# Patient Record
Sex: Female | Born: 1965
Health system: Southern US, Community
[De-identification: ages and names within clinical notes are randomized; demographics above are authoritative.]

## PROBLEM LIST (undated history)

## (undated) DIAGNOSIS — E279 Disorder of adrenal gland, unspecified: Secondary | ICD-10-CM

## (undated) DIAGNOSIS — Z9289 Personal history of other medical treatment: Secondary | ICD-10-CM

## (undated) DIAGNOSIS — G43909 Migraine, unspecified, not intractable, without status migrainosus: Secondary | ICD-10-CM

## (undated) DIAGNOSIS — I890 Lymphedema, not elsewhere classified: Secondary | ICD-10-CM

## (undated) DIAGNOSIS — C50919 Malignant neoplasm of unspecified site of unspecified female breast: Secondary | ICD-10-CM

## (undated) DIAGNOSIS — Z9889 Other specified postprocedural states: Secondary | ICD-10-CM

## (undated) DIAGNOSIS — Z124 Encounter for screening for malignant neoplasm of cervix: Secondary | ICD-10-CM

## (undated) HISTORY — DX: Other specified postprocedural states: Z98.890

## (undated) HISTORY — PX: BREAST SURGERY: SHX581

## (undated) HISTORY — DX: Personal history of other medical treatment: Z92.89

## (undated) HISTORY — DX: Lymphedema, not elsewhere classified: I89.0

## (undated) HISTORY — DX: Encounter for screening for malignant neoplasm of cervix: Z12.4

## (undated) HISTORY — DX: Migraine, unspecified, not intractable, without status migrainosus: G43.909

## (undated) HISTORY — PX: HERNIA REPAIR: SHX51

## (undated) HISTORY — DX: Disorder of adrenal gland, unspecified: E27.9

## (undated) HISTORY — DX: Malignant neoplasm of unspecified site of unspecified female breast: C50.919

---

## 1992-03-17 HISTORY — PX: CARDIAC CATHETERIZATION: SHX172

## 2003-03-18 HISTORY — PX: BREAST ENHANCEMENT SURGERY: SHX7

## 2006-06-18 DIAGNOSIS — G43909 Migraine, unspecified, not intractable, without status migrainosus: Secondary | ICD-10-CM | POA: Insufficient documentation

## 2007-02-03 ENCOUNTER — Emergency Department: Payer: Self-pay | Admitting: Emergency Medicine

## 2007-03-04 ENCOUNTER — Ambulatory Visit: Payer: Self-pay

## 2008-05-11 ENCOUNTER — Ambulatory Visit: Payer: Self-pay | Admitting: Family Medicine

## 2009-04-12 ENCOUNTER — Ambulatory Visit: Payer: Self-pay | Admitting: Family Medicine

## 2009-11-30 ENCOUNTER — Other Ambulatory Visit: Payer: Self-pay | Admitting: Family Medicine

## 2010-12-24 ENCOUNTER — Ambulatory Visit: Payer: Self-pay | Admitting: Obstetrics and Gynecology

## 2011-01-01 DIAGNOSIS — C50919 Malignant neoplasm of unspecified site of unspecified female breast: Secondary | ICD-10-CM | POA: Insufficient documentation

## 2011-01-01 DIAGNOSIS — Z853 Personal history of malignant neoplasm of breast: Secondary | ICD-10-CM | POA: Insufficient documentation

## 2011-02-15 HISTORY — PX: MASTECTOMY: SHX3

## 2011-03-18 HISTORY — PX: PORT A CATH INJECTION (ARMC HX): HXRAD1731

## 2011-04-03 ENCOUNTER — Ambulatory Visit: Payer: Self-pay | Admitting: Internal Medicine

## 2011-04-18 ENCOUNTER — Ambulatory Visit: Payer: Self-pay | Admitting: Internal Medicine

## 2011-05-16 ENCOUNTER — Ambulatory Visit: Payer: Self-pay | Admitting: Internal Medicine

## 2011-06-16 ENCOUNTER — Ambulatory Visit: Payer: Self-pay | Admitting: Internal Medicine

## 2011-07-16 ENCOUNTER — Ambulatory Visit: Payer: Self-pay | Admitting: Internal Medicine

## 2011-07-16 ENCOUNTER — Encounter: Payer: Self-pay | Admitting: Hematology & Oncology

## 2011-07-29 LAB — CBC CANCER CENTER
Basophil #: 0.1 x10 3/mm (ref 0.0–0.1)
Eosinophil #: 0.1 x10 3/mm (ref 0.0–0.7)
Eosinophil %: 1.3 %
HCT: 32.1 % — ABNORMAL LOW (ref 35.0–47.0)
HGB: 10.7 g/dL — ABNORMAL LOW (ref 12.0–16.0)
Lymphocyte #: 1 x10 3/mm (ref 1.0–3.6)
Lymphocyte %: 16.1 %
MCHC: 33.5 g/dL (ref 32.0–36.0)
Monocyte #: 0.8 x10 3/mm (ref 0.2–0.9)
Neutrophil #: 4.1 x10 3/mm (ref 1.4–6.5)
Neutrophil %: 68.6 %
Platelet: 430 x10 3/mm (ref 150–440)
WBC: 6 x10 3/mm (ref 3.6–11.0)

## 2011-07-29 LAB — COMPREHENSIVE METABOLIC PANEL
Albumin: 2.9 g/dL — ABNORMAL LOW (ref 3.4–5.0)
Alkaline Phosphatase: 83 U/L (ref 50–136)
Anion Gap: 7 (ref 7–16)
Calcium, Total: 8.3 mg/dL — ABNORMAL LOW (ref 8.5–10.1)
Chloride: 106 mmol/L (ref 98–107)
Co2: 28 mmol/L (ref 21–32)
EGFR (African American): >60
EGFR (Non-African Amer.): >60
Glucose: 118 mg/dL — ABNORMAL HIGH (ref 65–99)
SGOT(AST): 64 U/L — ABNORMAL HIGH (ref 15–37)
Sodium: 141 mmol/L (ref 136–145)
Total Protein: 6.1 g/dL — ABNORMAL LOW (ref 6.4–8.2)

## 2011-07-29 LAB — PROTIME-INR: INR: 1

## 2011-07-29 LAB — APTT: Activated PTT: 39.1 secs — ABNORMAL HIGH (ref 23.6–35.9)

## 2011-08-16 ENCOUNTER — Ambulatory Visit: Payer: Self-pay | Admitting: Internal Medicine

## 2011-08-16 ENCOUNTER — Encounter: Payer: Self-pay | Admitting: Hematology & Oncology

## 2011-09-10 DIAGNOSIS — E28319 Asymptomatic premature menopause: Secondary | ICD-10-CM | POA: Insufficient documentation

## 2011-09-10 DIAGNOSIS — E8989 Other postprocedural endocrine and metabolic complications and disorders: Secondary | ICD-10-CM | POA: Insufficient documentation

## 2011-09-10 DIAGNOSIS — R0989 Other specified symptoms and signs involving the circulatory and respiratory systems: Secondary | ICD-10-CM | POA: Insufficient documentation

## 2011-09-10 DIAGNOSIS — I890 Lymphedema, not elsewhere classified: Secondary | ICD-10-CM | POA: Insufficient documentation

## 2011-09-15 ENCOUNTER — Ambulatory Visit: Payer: Self-pay | Admitting: Internal Medicine

## 2011-09-15 ENCOUNTER — Encounter: Payer: Self-pay | Admitting: Hematology & Oncology

## 2011-10-16 ENCOUNTER — Encounter: Payer: Self-pay | Admitting: Hematology & Oncology

## 2011-10-16 ENCOUNTER — Ambulatory Visit: Payer: Self-pay | Admitting: Internal Medicine

## 2011-10-29 DIAGNOSIS — Z8709 Personal history of other diseases of the respiratory system: Secondary | ICD-10-CM | POA: Insufficient documentation

## 2011-11-16 ENCOUNTER — Encounter: Payer: Self-pay | Admitting: Hematology & Oncology

## 2011-12-08 ENCOUNTER — Ambulatory Visit: Payer: Self-pay | Admitting: Internal Medicine

## 2011-12-16 ENCOUNTER — Ambulatory Visit: Payer: Self-pay | Admitting: Internal Medicine

## 2011-12-16 ENCOUNTER — Encounter: Payer: Self-pay | Admitting: Hematology & Oncology

## 2012-01-16 ENCOUNTER — Encounter: Payer: Self-pay | Admitting: Hematology & Oncology

## 2012-02-05 ENCOUNTER — Ambulatory Visit: Payer: Self-pay | Admitting: Internal Medicine

## 2012-02-05 LAB — CBC CANCER CENTER
Basophil %: 0.8 %
Eosinophil #: 0.1 x10 3/mm (ref 0.0–0.7)
HGB: 13.5 g/dL (ref 12.0–16.0)
Lymphocyte #: 1 x10 3/mm (ref 1.0–3.6)
Lymphocyte %: 16.3 %
MCH: 31 pg (ref 26.0–34.0)
MCHC: 32.8 g/dL (ref 32.0–36.0)
Monocyte #: 0.4 x10 3/mm (ref 0.2–0.9)
Neutrophil %: 73.9 %
Platelet: 211 x10 3/mm (ref 150–440)
RBC: 4.37 10*6/uL (ref 3.80–5.20)
RDW: 13.8 % (ref 11.5–14.5)

## 2012-02-15 ENCOUNTER — Encounter: Payer: Self-pay | Admitting: Hematology & Oncology

## 2012-02-15 ENCOUNTER — Ambulatory Visit: Payer: Self-pay | Admitting: Internal Medicine

## 2012-03-17 ENCOUNTER — Ambulatory Visit: Payer: Self-pay | Admitting: Internal Medicine

## 2012-03-17 ENCOUNTER — Encounter: Payer: Self-pay | Admitting: Hematology & Oncology

## 2012-04-17 ENCOUNTER — Encounter: Payer: Self-pay | Admitting: Hematology & Oncology

## 2012-04-21 ENCOUNTER — Ambulatory Visit: Payer: Self-pay | Admitting: Internal Medicine

## 2012-05-15 ENCOUNTER — Ambulatory Visit: Payer: Self-pay | Admitting: Internal Medicine

## 2012-05-15 ENCOUNTER — Encounter: Payer: Self-pay | Admitting: Hematology & Oncology

## 2012-06-04 LAB — COMPREHENSIVE METABOLIC PANEL
Albumin: 3.6 g/dL (ref 3.4–5.0)
Alkaline Phosphatase: 87 U/L (ref 50–136)
BUN: 24 mg/dL — ABNORMAL HIGH (ref 7–18)
Bilirubin,Total: 0.4 mg/dL (ref 0.2–1.0)
Calcium, Total: 8.2 mg/dL — ABNORMAL LOW (ref 8.5–10.1)
Chloride: 101 mmol/L (ref 98–107)
Creatinine: 0.98 mg/dL (ref 0.60–1.30)
EGFR (African American): >60
EGFR (Non-African Amer.): >60
Glucose: 89 mg/dL (ref 65–99)
Osmolality: 281 (ref 275–301)
Potassium: 3.8 mmol/L (ref 3.5–5.1)
SGOT(AST): 28 U/L (ref 15–37)
SGPT (ALT): 57 U/L (ref 12–78)
Sodium: 139 mmol/L (ref 136–145)

## 2012-06-04 LAB — CBC CANCER CENTER
Basophil %: 0.7 %
MCV: 94 fL (ref 80–100)
Monocyte #: 0.7 x10 3/mm (ref 0.2–0.9)
RBC: 4.18 10*6/uL (ref 3.80–5.20)
WBC: 9.5 x10 3/mm (ref 3.6–11.0)

## 2012-06-04 LAB — LIPID PANEL
Cholesterol: 168 mg/dL (ref 0–200)
HDL Cholesterol: 51 mg/dL (ref 40–60)
Triglycerides: 71 mg/dL (ref 0–200)
VLDL Cholesterol, Calc: 14 mg/dL (ref 5–40)

## 2012-06-15 ENCOUNTER — Ambulatory Visit: Payer: Self-pay | Admitting: Internal Medicine

## 2012-07-27 ENCOUNTER — Ambulatory Visit: Payer: Self-pay | Admitting: Internal Medicine

## 2012-07-29 LAB — CBC CANCER CENTER
Basophil #: 0 x10 3/mm (ref 0.0–0.1)
Basophil %: 0.8 %
Eosinophil #: 0.2 x10 3/mm (ref 0.0–0.7)
Eosinophil %: 3.9 %
HCT: 39.2 % (ref 35.0–47.0)
HGB: 13.5 g/dL (ref 12.0–16.0)
Lymphocyte #: 1.1 x10 3/mm (ref 1.0–3.6)
Lymphocyte %: 26.1 %
MCHC: 34.3 g/dL (ref 32.0–36.0)
MCV: 94 fL (ref 80–100)
Monocyte #: 0.3 x10 3/mm (ref 0.2–0.9)
Neutrophil %: 61.5 %
Platelet: 186 x10 3/mm (ref 150–440)
RBC: 4.18 10*6/uL (ref 3.80–5.20)
RDW: 13 % (ref 11.5–14.5)

## 2012-08-15 ENCOUNTER — Ambulatory Visit: Payer: Self-pay | Admitting: Internal Medicine

## 2012-09-14 ENCOUNTER — Ambulatory Visit: Payer: Self-pay | Admitting: Internal Medicine

## 2012-10-15 ENCOUNTER — Ambulatory Visit: Payer: Self-pay | Admitting: Internal Medicine

## 2012-11-15 ENCOUNTER — Ambulatory Visit: Payer: Self-pay | Admitting: Internal Medicine

## 2012-11-23 LAB — COMPREHENSIVE METABOLIC PANEL
Albumin: 4 g/dL (ref 3.4–5.0)
Anion Gap: 10 (ref 7–16)
BUN: 21 mg/dL — ABNORMAL HIGH (ref 7–18)
Bilirubin,Total: 0.3 mg/dL (ref 0.2–1.0)
Calcium, Total: 9.1 mg/dL (ref 8.5–10.1)
Chloride: 106 mmol/L (ref 98–107)
Glucose: 77 mg/dL (ref 65–99)
Potassium: 4 mmol/L (ref 3.5–5.1)
SGPT (ALT): 36 U/L (ref 12–78)
Sodium: 143 mmol/L (ref 136–145)

## 2012-11-23 LAB — CBC CANCER CENTER
Basophil #: 0 x10 3/mm (ref 0.0–0.1)
Basophil %: 0.5 %
Eosinophil #: 0.2 x10 3/mm (ref 0.0–0.7)
Eosinophil %: 3.1 %
MCH: 31.1 pg (ref 26.0–34.0)
MCV: 94 fL (ref 80–100)
Monocyte #: 0.5 x10 3/mm (ref 0.2–0.9)
Monocyte %: 9.2 %
Neutrophil #: 2.8 x10 3/mm (ref 1.4–6.5)
Platelet: 178 x10 3/mm (ref 150–440)
RBC: 4.54 10*6/uL (ref 3.80–5.20)
RDW: 13.1 % (ref 11.5–14.5)
WBC: 5 x10 3/mm (ref 3.6–11.0)

## 2012-12-15 ENCOUNTER — Ambulatory Visit: Payer: Self-pay | Admitting: Internal Medicine

## 2013-01-19 ENCOUNTER — Ambulatory Visit: Payer: Self-pay | Admitting: Internal Medicine

## 2013-02-14 ENCOUNTER — Ambulatory Visit: Payer: Self-pay | Admitting: Internal Medicine

## 2013-03-14 LAB — CBC CANCER CENTER
HCT: 42.2 % (ref 35.0–47.0)
Lymphocyte %: 28.8 %
MCV: 93 fL (ref 80–100)
Monocyte #: 0.5 x10 3/mm (ref 0.2–0.9)
Neutrophil #: 2.8 x10 3/mm (ref 1.4–6.5)
Platelet: 199 x10 3/mm (ref 150–440)
RBC: 4.54 10*6/uL (ref 3.80–5.20)
RDW: 12.9 % (ref 11.5–14.5)

## 2013-03-14 LAB — COMPREHENSIVE METABOLIC PANEL
Albumin: 3.5 g/dL (ref 3.4–5.0)
Anion Gap: 8 (ref 7–16)
BUN: 23 mg/dL — ABNORMAL HIGH (ref 7–18)
Bilirubin,Total: 0.2 mg/dL (ref 0.2–1.0)
Calcium, Total: 8.7 mg/dL (ref 8.5–10.1)
Chloride: 103 mmol/L (ref 98–107)
Co2: 28 mmol/L (ref 21–32)
Creatinine: 0.84 mg/dL (ref 0.60–1.30)
EGFR (African American): >60
EGFR (Non-African Amer.): >60
Glucose: 110 mg/dL — ABNORMAL HIGH (ref 65–99)
Osmolality: 282 (ref 275–301)
Potassium: 4 mmol/L (ref 3.5–5.1)
Sodium: 139 mmol/L (ref 136–145)
Total Protein: 6.7 g/dL (ref 6.4–8.2)

## 2013-03-17 ENCOUNTER — Ambulatory Visit: Payer: Self-pay | Admitting: Internal Medicine

## 2013-03-17 DIAGNOSIS — Z9289 Personal history of other medical treatment: Secondary | ICD-10-CM

## 2013-03-17 HISTORY — DX: Personal history of other medical treatment: Z92.89

## 2013-04-12 ENCOUNTER — Ambulatory Visit: Payer: Self-pay | Admitting: Family Medicine

## 2013-04-13 ENCOUNTER — Encounter: Payer: Self-pay | Admitting: Family Medicine

## 2013-04-17 ENCOUNTER — Ambulatory Visit: Payer: Self-pay | Admitting: Internal Medicine

## 2013-04-17 ENCOUNTER — Encounter: Payer: Self-pay | Admitting: Family Medicine

## 2013-05-15 ENCOUNTER — Encounter: Payer: Self-pay | Admitting: Family Medicine

## 2013-05-16 ENCOUNTER — Ambulatory Visit: Payer: Self-pay | Admitting: Internal Medicine

## 2013-05-16 LAB — BUN: BUN: 18 mg/dL (ref 7–18)

## 2013-05-16 LAB — CREATININE, SERUM
Creatinine: 0.94 mg/dL (ref 0.60–1.30)
EGFR (African American): >60
EGFR (Non-African Amer.): >60

## 2013-05-16 LAB — POTASSIUM: POTASSIUM: 4.4 mmol/L (ref 3.5–5.1)

## 2013-06-15 ENCOUNTER — Ambulatory Visit: Payer: Self-pay | Admitting: Internal Medicine

## 2013-06-15 ENCOUNTER — Encounter: Payer: Self-pay | Admitting: Family Medicine

## 2013-06-22 LAB — BASIC METABOLIC PANEL
ANION GAP: 7 (ref 7–16)
BUN: 19 mg/dL — ABNORMAL HIGH (ref 7–18)
CHLORIDE: 102 mmol/L (ref 98–107)
CREATININE: 0.86 mg/dL (ref 0.60–1.30)
Calcium, Total: 9.4 mg/dL (ref 8.5–10.1)
Co2: 31 mmol/L (ref 21–32)
EGFR (African American): >60
EGFR (Non-African Amer.): >60
GLUCOSE: 106 mg/dL — AB (ref 65–99)
Osmolality: 282 (ref 275–301)
Potassium: 4 mmol/L (ref 3.5–5.1)
Sodium: 140 mmol/L (ref 136–145)

## 2013-07-15 ENCOUNTER — Ambulatory Visit: Payer: Self-pay | Admitting: Internal Medicine

## 2013-11-30 LAB — HEMOGLOBIN A1C: HEMOGLOBIN A1C: 5.5 % (ref 4.0–6.0)

## 2014-01-04 ENCOUNTER — Encounter: Payer: Self-pay | Admitting: Hematology & Oncology

## 2014-01-04 DIAGNOSIS — I890 Lymphedema, not elsewhere classified: Secondary | ICD-10-CM | POA: Insufficient documentation

## 2014-01-04 DIAGNOSIS — I972 Postmastectomy lymphedema syndrome: Secondary | ICD-10-CM | POA: Insufficient documentation

## 2014-01-15 ENCOUNTER — Encounter: Payer: Self-pay | Admitting: Hematology & Oncology

## 2014-02-22 ENCOUNTER — Ambulatory Visit: Payer: Self-pay | Admitting: General Practice

## 2014-06-16 LAB — BASIC METABOLIC PANEL
BUN: 16 mg/dL (ref 4–21)
Creatinine: 0.9 mg/dL (ref 0.5–1.1)
Glucose: 92 mg/dL
Potassium: 4.3 mmol/L (ref 3.4–5.3)
Sodium: 143 mmol/L (ref 137–147)

## 2014-06-16 LAB — HEPATIC FUNCTION PANEL
ALT: 25 U/L (ref 7–35)
AST: 22 U/L (ref 13–35)

## 2014-06-26 ENCOUNTER — Encounter: Payer: Self-pay | Admitting: *Deleted

## 2014-09-11 ENCOUNTER — Other Ambulatory Visit: Payer: Self-pay | Admitting: Family Medicine

## 2014-09-11 DIAGNOSIS — F419 Anxiety disorder, unspecified: Secondary | ICD-10-CM

## 2014-09-11 DIAGNOSIS — G4700 Insomnia, unspecified: Secondary | ICD-10-CM

## 2014-09-11 MED ORDER — TEMAZEPAM 15 MG PO CAPS: 15.0000 mg | ORAL_CAPSULE | Freq: Every evening | ORAL | Status: DC | PRN

## 2014-09-11 NOTE — Telephone Encounter (Signed)
Rx called into [place].    Thanks,   -Mickel Baas

## 2014-09-11 NOTE — Telephone Encounter (Signed)
Pt called requesting an RX for Restoril 15 mg sent to [facility]. Pt stated her doctor at [facility] used to wrote this for pt and she is having trouble sleeping now that she is on first shift. I advised that we can not do that without seeing her for OV because we didn't write that for her. Pt stated to please ask Dr. Venia Minks anyway because she knows pt history. Thanks TNP

## 2014-09-22 ENCOUNTER — Other Ambulatory Visit: Payer: Self-pay | Admitting: Family Medicine

## 2014-09-22 DIAGNOSIS — K219 Gastro-esophageal reflux disease without esophagitis: Secondary | ICD-10-CM

## 2014-10-03 DIAGNOSIS — E7800 Pure hypercholesterolemia, unspecified: Secondary | ICD-10-CM | POA: Insufficient documentation

## 2014-10-03 DIAGNOSIS — N959 Unspecified menopausal and perimenopausal disorder: Secondary | ICD-10-CM | POA: Insufficient documentation

## 2014-10-03 DIAGNOSIS — I890 Lymphedema, not elsewhere classified: Secondary | ICD-10-CM | POA: Insufficient documentation

## 2014-10-03 DIAGNOSIS — C50919 Malignant neoplasm of unspecified site of unspecified female breast: Secondary | ICD-10-CM | POA: Insufficient documentation

## 2014-10-03 DIAGNOSIS — I972 Postmastectomy lymphedema syndrome: Secondary | ICD-10-CM | POA: Insufficient documentation

## 2014-10-03 DIAGNOSIS — R7303 Prediabetes: Secondary | ICD-10-CM | POA: Insufficient documentation

## 2014-10-03 DIAGNOSIS — I456 Pre-excitation syndrome: Secondary | ICD-10-CM | POA: Insufficient documentation

## 2014-10-03 DIAGNOSIS — N644 Mastodynia: Secondary | ICD-10-CM | POA: Insufficient documentation

## 2014-10-03 DIAGNOSIS — R609 Edema, unspecified: Secondary | ICD-10-CM | POA: Insufficient documentation

## 2014-10-03 DIAGNOSIS — R635 Abnormal weight gain: Secondary | ICD-10-CM | POA: Insufficient documentation

## 2014-10-04 ENCOUNTER — Ambulatory Visit (INDEPENDENT_AMBULATORY_CARE_PROVIDER_SITE_OTHER): Payer: BLUE CROSS/BLUE SHIELD | Admitting: Family Medicine

## 2014-10-04 ENCOUNTER — Encounter: Payer: Self-pay | Admitting: Family Medicine

## 2014-10-04 VITALS — BP 122/78 | HR 88 | Temp 97.8°F | Resp 16 | Ht 65.0 in | Wt 208.0 lb

## 2014-10-04 DIAGNOSIS — G4700 Insomnia, unspecified: Secondary | ICD-10-CM

## 2014-10-04 DIAGNOSIS — R21 Rash and other nonspecific skin eruption: Secondary | ICD-10-CM | POA: Diagnosis not present

## 2014-10-04 DIAGNOSIS — B372 Candidiasis of skin and nail: Secondary | ICD-10-CM

## 2014-10-04 MED ORDER — TRAZODONE HCL 50 MG PO TABS: 50.0000 mg | ORAL_TABLET | Freq: Every day | ORAL | Status: DC

## 2014-10-04 MED ORDER — NYSTATIN 100000 UNIT/GM EX OINT: 1.0000 "application " | TOPICAL_OINTMENT | Freq: Two times a day (BID) | CUTANEOUS | Status: DC

## 2014-10-04 NOTE — Progress Notes (Signed)
Subjective:    Patient ID: Sabrina Morris, female    DOB: 06-29-1965, 49 y.o.   MRN: [id number]  Rash This is a new problem. The current episode started 1 to 4 weeks ago. The problem is unchanged. The affected locations include the chest. The rash is characterized by itchiness and redness. She was exposed to nothing. Pertinent negatives include no congestion, cough, eye pain, fatigue, fever, joint pain, rhinorrhea, shortness of breath or sore throat. Past treatments include anti-itch cream. The treatment provided no relief.   Patient Active Problem List   Diagnosis Date Noted  . Breast CA 10/03/2014  . Breast pain 10/03/2014  . Accumulation of fluid in tissues 10/03/2014  . Hypercholesteremia 10/03/2014  . Acquired lymphedema 10/03/2014  . Menopausal and perimenopausal disorder 10/03/2014  . Borderline diabetes 10/03/2014  . Abnormal weight gain 10/03/2014  . Ventricular pre-excitation with arrhythmia 10/03/2014  . GERD (gastroesophageal reflux disease) 09/22/2014  . Anxiety 09/11/2014  . 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 Lymphedema of leg 01/04/2014  . History of asthma 10/29/2011  . Menopause praecox 09/10/2011  . Lymphedema of arm 09/10/2011  . Abnormal chest sounds 09/10/2011  . Infiltrating ductal carcinoma of breast, stage 3 01/01/2011  . Headache, migraine 06/18/2006   Family History  Problem Relation Age of Onset  . Adopted: Yes  . Family history unknown: Yes   History   Social History  . Marital Status: Single    Spouse Name: N/A  . Number of Children: N/A  . Years of Education: N/A   Occupational History  . 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 Smoking status: Never Smoker   . Smokeless tobacco: Never Used  . Alcohol Use: Yes     Comment: Very rarely  . Drug Use: No  . Sexual Activity: Not on file   Other Topics Concern  . Not on file   Social History Narrative  . No narrative on file   Past Surgical  History  Procedure Laterality Date  . Mastectomy Right 02/2011  . Cardiac catheterization  1994  . Breast enhancement surgery Bilateral 2005   Allergies  Allergen Reactions  . Sumatriptan Other (See Comments) and Shortness Of Breath  . Sulfa Antibiotics   . Zoladex  [Goserelin]   . Hydrocodone-Acetaminophen Rash    Hyperactivity   Previous Medications   BUPROPION (WELLBUTRIN SR) 150 MG 12 HR TABLET    Take 1 tablet by mouth daily.   CYCLOBENZAPRINE (FLEXERIL) 5 MG TABLET    Take 1 tablet by mouth as needed.   ESCITALOPRAM (LEXAPRO) 20 MG TABLET    TAKE 1 TABLET EVERY DAY   FUROSEMIDE (LASIX) 20 MG TABLET    Take 1 tablet by mouth daily.   LORAZEPAM (ATIVAN) 1 MG TABLET    Take 1 tablet by mouth 2 (two) times daily. As needed   MELOXICAM (MOBIC) 7.5 MG TABLET    Take 1 tablet by mouth 2 (two) times daily. As needed   OMEPRAZOLE-SODIUM BICARBONATE (ZEGERID) 20-1100 MG CAPS CAPSULE    Take 1 capsule by mouth daily.   POTASSIUM CHLORIDE (K-DUR,KLOR-CON) 10 MEQ TABLET    Take 1 tablet by mouth daily.   RIZATRIPTAN (MAXALT) 10 MG TABLET    Take 1 tablet by mouth as needed.   TEMAZEPAM (RESTORIL) 15 MG CAPSULE    Take 1 capsule (15 mg total) by mouth at bedtime as needed for sleep.   BP 122/78  mmHg  Pulse 88  Temp(Src) 97.8 F (36.6 C) (Oral)  Resp 16  Ht 5\' 5"  (1.651 m)  Wt 208 lb (94.348 kg)  BMI 34.61 kg/m2    Review of Systems  Constitutional: Negative for fever, chills, diaphoresis, activity change, appetite change, fatigue and unexpected weight change.  HENT: Negative for congestion, rhinorrhea and sore throat.   Eyes: Negative for pain.  Respiratory: Negative for cough and shortness of breath.   Musculoskeletal: Negative for joint pain.  Skin: Positive for rash. Negative for color change, pallor and wound.       Objective:   Physical Exam  Constitutional: She appears well-developed and well-nourished.  Cardiovascular: Normal rate and regular rhythm.   Skin: Skin is  warm. Rash (muliple circular scaly patches at border of skin where prosthesis touches skin. Some satellite lesions around left  breast. ) noted. There is erythema.   BP 122/78 mmHg  Pulse 88  Temp(Src) 97.8 F (36.6 C) (Oral)  Resp 16  Ht 5\' 5"  (1.651 m)  Wt 208 lb (94.348 kg)  BMI 34.61 kg/m2      Assessment & Plan:  1. Rash Suspect Tinea.   2. Skin yeast infection For  Tinea corporis, will treat. Please call back if condition worsens or does not continue to improve.    - nystatin ointment (MYCOSTATIN); Apply 1 application topically 2 (two) times daily.  Dispense: 30 g; Refill: 1  3. Insomnia Did not improve with other medication. Will try Trazodone. Call back if condition worsens or does not continue to improve.    - traZODone (DESYREL) 50 MG tablet; Take 1 tablet (50 mg total) by mouth at bedtime.  Dispense: 30 tablet; Refill: 5   Margarita Rana, MD

## 2014-11-10 ENCOUNTER — Other Ambulatory Visit: Payer: Self-pay | Admitting: *Deleted

## 2014-11-10 ENCOUNTER — Inpatient Hospital Stay: Payer: Self-pay | Admitting: Family Medicine

## 2014-11-10 ENCOUNTER — Inpatient Hospital Stay: Payer: Self-pay | Attending: Family Medicine

## 2014-11-10 DIAGNOSIS — C50919 Malignant neoplasm of unspecified site of unspecified female breast: Secondary | ICD-10-CM

## 2014-11-30 ENCOUNTER — Other Ambulatory Visit: Payer: Self-pay

## 2014-11-30 DIAGNOSIS — E876 Hypokalemia: Secondary | ICD-10-CM | POA: Insufficient documentation

## 2014-11-30 MED ORDER — POTASSIUM CHLORIDE CRYS ER 10 MEQ PO TBCR: 10.0000 meq | EXTENDED_RELEASE_TABLET | Freq: Every day | ORAL | Status: DC

## 2014-12-13 ENCOUNTER — Other Ambulatory Visit: Payer: Self-pay

## 2014-12-13 ENCOUNTER — Ambulatory Visit: Payer: Self-pay | Admitting: Family Medicine

## 2014-12-14 ENCOUNTER — Other Ambulatory Visit: Payer: Self-pay

## 2014-12-14 DIAGNOSIS — F419 Anxiety disorder, unspecified: Secondary | ICD-10-CM

## 2014-12-14 MED ORDER — LORAZEPAM 1 MG PO TABS: 1.0000 mg | ORAL_TABLET | Freq: Two times a day (BID) | ORAL | Status: DC

## 2014-12-14 NOTE — Telephone Encounter (Signed)
Ok to call in rx.  Thanks.  

## 2015-02-16 ENCOUNTER — Other Ambulatory Visit: Payer: Self-pay

## 2015-02-16 MED ORDER — MELOXICAM 7.5 MG PO TABS: 7.5000 mg | ORAL_TABLET | Freq: Two times a day (BID) | ORAL | Status: DC

## 2015-02-19 ENCOUNTER — Other Ambulatory Visit: Payer: Self-pay | Admitting: Family Medicine

## 2015-02-19 DIAGNOSIS — G43809 Other migraine, not intractable, without status migrainosus: Secondary | ICD-10-CM

## 2015-02-19 MED ORDER — RIZATRIPTAN BENZOATE 10 MG PO TABS: 10.0000 mg | ORAL_TABLET | ORAL | Status: DC | PRN

## 2015-02-19 NOTE — Telephone Encounter (Signed)
Pt contacted office for refill request on the following medications: rizatriptan (MAXALT) 10 MG tablet to [facility]. Thanks TNP

## 2015-02-23 ENCOUNTER — Telehealth: Payer: Self-pay | Admitting: Family Medicine

## 2015-02-23 NOTE — Telephone Encounter (Signed)
Pt stated that she has noticed herself gaining weight and she read about different effects that escitalopram (LEXAPRO) 20 MG tablet could cause. Pt would like to know what she should do to start coming off of the medication. Pt was advised that Dr. Venia Minks is out of the office this afternoon. Thanks TNP

## 2015-02-23 NOTE — Telephone Encounter (Signed)
Would recommend a taper.  Can send in 2 10 mg and decrease to 15 for 2 weeks and then 10 for 2 weeks, etc. Or she can try to cut 20 in half and call me in 2 weeks to continue taper if feels ok. Thanks.

## 2015-02-27 NOTE — Telephone Encounter (Signed)
Left message to call back  

## 2015-02-28 NOTE — Telephone Encounter (Signed)
Pt agrees to take 10 mg po qd x 2 weeks, and then call Dr. Venia Minks. [name], CMA

## 2015-03-14 ENCOUNTER — Telehealth: Payer: Self-pay | Admitting: Family Medicine

## 2015-03-14 NOTE — Telephone Encounter (Signed)
LMTCB. sd  

## 2015-03-14 NOTE — Telephone Encounter (Signed)
Patient advised to take 5 mg daily for 2-3 weeks then she can discontinue Lexapro completely. Patient verbalizes understanding and is in agreement with treatment plan.

## 2015-03-14 NOTE — Telephone Encounter (Signed)
Pt is returning call/MW °

## 2015-03-14 NOTE — Telephone Encounter (Signed)
Pt has questions about the Rx escitalopram (LEXAPRO) 20 MG tablet.  Pt states she has decreasing this Rx.  Pt is asking what mg she need to take now and how long should she take the new mg.  [phone] [phone]

## 2015-03-22 ENCOUNTER — Telehealth: Payer: Self-pay | Admitting: Family Medicine

## 2015-03-22 DIAGNOSIS — Z1211 Encounter for screening for malignant neoplasm of colon: Secondary | ICD-10-CM

## 2015-03-22 NOTE — Telephone Encounter (Signed)
Please schedule referral. Order placed in chart. Thanks!

## 2015-03-22 NOTE — Telephone Encounter (Signed)
Pt called wanting you to refer her to Dr. Ottis Stain in [place] with [facility].  Phone # [phone].  fax [phone].  She is 49 and wants to get a colonoscopy and endoscopy.  She has hx of breast ca.  Her call back is [phone]  Thanks, Con Memos

## 2015-03-22 NOTE — Telephone Encounter (Signed)
Ok to put in referral.  Thanks.

## 2015-04-12 ENCOUNTER — Ambulatory Visit: Payer: Managed Care, Other (non HMO) | Attending: Hematology & Oncology | Admitting: Occupational Therapy

## 2015-04-12 DIAGNOSIS — I972 Postmastectomy lymphedema syndrome: Secondary | ICD-10-CM | POA: Insufficient documentation

## 2015-04-12 DIAGNOSIS — I890 Lymphedema, not elsewhere classified: Secondary | ICD-10-CM | POA: Diagnosis not present

## 2015-04-12 NOTE — Therapy (Signed)
[facility] [address], [place], [address] Phone: [phone]   Fax:  [phone]  Occupational Therapy Treatment  Patient Details  Name: Sabrina Morris MRN: [id number] Date of Birth: 11-22-1965 Referring Provider: Juanita Craver  Encounter Date: 04/12/2015      OT End of Session - 04/12/15 2044    Visit Number 1   Number of Visits 4   Date for OT Re-Evaluation 05/10/15   OT Start Time [id number]   OT Stop Time 1148   OT Time Calculation (min) 73 min   Activity Tolerance Patient tolerated treatment well   Behavior During Therapy [facility] for tasks assessed/performed      No past medical history on file.  Past Surgical History  Procedure Laterality Date  . Mastectomy Right 02/2011  . Cardiac catheterization  1994  . Breast enhancement surgery Bilateral 2005    There were no vitals filed for this visit.  Visit Diagnosis:  Lymphedema of both lower extremities - Plan: Ot plan of care cert/re-cert  Post-mastectomy lymphedema syndrome - Plan: Ot plan of care cert/re-cert      Subjective Assessment - 04/12/15 1835    Subjective  I am working 10-12 hrs day , and 75% of the time sitting or 25% standing - wearing my compression hose on legs , and R arm - and then I am pumping about 3 or more hours a night -  I do not have time to do anyting else - some mornings I have to do it to before going to work - or if I am goint to be active I have to pump afterward s - I gained about 30lbs  since I seen you last    Patient Stated Goals I just want my life back - or time that I can sit and think what I need to make changes that  I can  feel better , be healthier and loose weight             [facility] OT Assessment - 04/12/15 0001    Assessment   Diagnosis R Mastectomy - lymphedema R UE and bilateral LE    Referring Provider Marcom   Onset Date --  2013   Assessment --   Prior Therapy --  2015   Home  Environment   Lives With  Alone   Prior Function   Level of Independence Independent   Vocation Full time employment   Leisure Pt works at [employer] full time at [employer] wear she works about 10-12 hrs day , sitting 75% of time , do not have time for anything else than to spend some times on weekends with niece and nephew          LYMPHEDEMA/ONCOLOGY QUESTIONNAIRE - 04/12/15 1846    Type   Cancer Type R breast CA   Surgeries   Mastectomy Date --  2012   Date Lymphedema/Swelling Started   Date --  2013   Treatment   Past Radiation Treatment --  2013   What other symptoms do you have   Are you Having Heaviness or Tightness Yes   Are you having Pain Yes   Is there Decreased scar mobility Yes   Lymphedema Stage   Stage STAGE 2 SPONTANEOUSLY IRREVERSIBLE   Right Upper Extremity Lymphedema   15 cm Proximal to Olecranon Process 36 cm   10 cm Proximal to Olecranon Process 35.2 cm   Olecranon Process 28.9 cm   15  cm Proximal to Ulnar Styloid Process 27.9 cm   10 cm Proximal to Ulnar Styloid Process 23.5 cm   Just Proximal to Ulnar Styloid Process 16.5 cm   Across Hand at [employer] 19 cm   At [place] of 2nd Digit 6.5 cm   At [facility] of Thumb 6.5 cm   Left Upper Extremity Lymphedema   15 cm Proximal to Olecranon Process 33 cm   10 cm Proximal to Olecranon Process 30.6 cm   Olecranon Process 27.2 cm   15 cm Proximal to Ulnar Styloid Process 25.9 cm   10 cm Proximal to Ulnar Styloid Process 22.2 cm   Just Proximal to Ulnar Styloid Process 17.4 cm   Across Hand at [employer] 19.2 cm   At [place] of 2nd Digit 6.5 cm   At [facility] of Thumb 6.8 cm   Right Lower Extremity Lymphedema   20 cm Proximal to Suprapatella 62 cm   10 cm Proximal to Suprapatella 51 cm   At Midpatella/Popliteal Crease 38.5 cm   30 cm Proximal to Floor at Lateral Plantar Foot 41.6 cm   20 cm Proximal to Floor at Lateral Plantar Foot 33.8 1   10  cm Proximal to Floor at Lateral Malleoli 24.5 cm   Circumference of ankle/heel 30 cm.   5  cm Proximal to 1st MTP Joint 21.5 cm   Across MTP Joint 21.5 cm   Around Proximal Great Toe 8 cm   Left Lower Extremity Lymphedema   20 cm Proximal to Suprapatella 65 cm   10 cm Proximal to Suprapatella 49.5 cm   At Midpatella/Popliteal Crease 37 cm   30 cm Proximal to Floor at Lateral Plantar Foot 41.5 cm   20 cm Proximal to Floor at Lateral Plantar Foot 33.3 cm   10 cm Proximal to Floor at Lateral Malleoli 23.3 cm   Circumference of ankle/heel 30.4 cm.   5 cm Proximal to 1st MTP Joint 22 cm   Across MTP Joint 22 cm   Around Proximal Great Toe 8.5 cm       Pt was ed on how to do toe wraps - she felt her toes are swelling at times - measurement wise she did not show increase in lymphedema   2cm gauze or lightly coban But not under her compression garments - she can try it at night time                  OT Education - 04/12/15 2044    Education provided Yes   Education Details Lymphedema , findings    Person(s) Educated Patient   Methods Explanation;Demonstration;Verbal cues   Comprehension Verbalized understanding             OT Long Term Goals - 04/12/15 2048    OT LONG TERM GOAL #1   Title Pt to be ind in wearing new compression garments for bilateral LE to maintain circumference    Baseline Bilateral LE increase by 1.2 to 2.6 on R  calf and thigh , L .2 to 3 cm calf and tigh; foot .8   Time 4   Period Weeks   Status New               Plan - 04/12/15 2046    Pt will benefit from skilled therapeutic intervention in order to improve on the following deficits (Retired) Increased edema;Decreased activity tolerance   Rehab Potential Fair   OT Frequency Biweekly   OT Duration 4 weeks  OT Treatment/Interventions Self-care/ADL training;DME and/or AE instruction;Patient/family education;Manual lymph drainage   OT Home Exercise Plan see pt instruction   Consulted and Agree with Plan of Care Patient        Problem List Patient Active Problem  List   Diagnosis Date Noted  . Hypokalemia 11/30/2014  . Breast CA ([place]) 10/03/2014  . Breast pain 10/03/2014  . Accumulation of fluid in tissues 10/03/2014  . Hypercholesteremia 10/03/2014  . Acquired lymphedema 10/03/2014  . Menopausal and perimenopausal disorder 10/03/2014  . Borderline diabetes 10/03/2014  . Abnormal weight gain 10/03/2014  . Ventricular pre-excitation with arrhythmia 10/03/2014  . GERD (gastroesophageal reflux disease) 09/22/2014  . Anxiety 09/11/2014  . 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 Lymphedema of leg 01/04/2014  . History of asthma 10/29/2011  . Menopause praecox 09/10/2011  . Lymphedema of arm 09/10/2011  . Abnormal chest sounds 09/10/2011  . Infiltrating ductal carcinoma of breast, stage 3 ([place]) 01/01/2011  . Headache, migraine 06/18/2006    Rosalyn Gess OTR/L,CLT 04/12/2015, 9:27 PM  [facility] [address] [place], [address] Phone: [phone]   Fax:  [phone]  Name: Sabrina Morris MRN: [id number] Date of Birth: 1965-12-13

## 2015-04-12 NOTE — Patient Instructions (Signed)
Pt to phone [employer] touch pump rep - info provided - to see if need to be recalibrated   Discuss with pt that compression hose comes with different darts widths at knees - depending if you sit more or move more around during day  She had them put full compression at knees- that is why knees is decrease and having some irritation - she can try and get once with less compression   But need new compression hose for LE  Arm one still okay if need to wait financially

## 2015-04-13 ENCOUNTER — Telehealth: Payer: Self-pay | Admitting: Family Medicine

## 2015-04-13 NOTE — Telephone Encounter (Signed)
Please clarify prescription and sig and what she is using it for  and then ok to fill. Do not have it in our records. Thanks.

## 2015-04-13 NOTE — Telephone Encounter (Signed)
Pt contacted office for refill request on the following medications:  Lidex Solution.  [place].  CB#[phone].  Pt states this was given to her by Dr Koleen Nimrod.  Pt did not want to set up an appointment and ask for a note to be sent to Dr Venia Minks.  Pt is aware Dr Venia Minks is out of the office/MW

## 2015-04-13 NOTE — Therapy (Signed)
[facility] [address], [place], [address] Phone: [phone]   Fax:  [phone]  Occupational Therapy Treatment  Patient Details  Name: Sabrina Morris MRN: [id number] Date of Birth: 18-Aug-1965 Referring Provider: Juanita Craver  Encounter Date: 04/12/2015      OT End of Session - 04/12/15 2044    Visit Number 1   Number of Visits 4   Date for OT Re-Evaluation 05/10/15   OT Start Time [id number]   OT Stop Time 1148   OT Time Calculation (min) 73 min   Activity Tolerance Patient tolerated treatment well   Behavior During Therapy [facility] for tasks assessed/performed      No past medical history on file.  Past Surgical History  Procedure Laterality Date  . Mastectomy Right 02/2011  . Cardiac catheterization  1994  . Breast enhancement surgery Bilateral 2005    There were no vitals filed for this visit.  Visit Diagnosis:  Lymphedema of both lower extremities - Plan: Ot plan of care cert/re-cert  Post-mastectomy lymphedema syndrome - Plan: Ot plan of care cert/re-cert      Subjective Assessment - 04/12/15 1835    Subjective  I am working 10-12 hrs day , and 75% of the time sitting or 25% standing - wearing my compression hose on legs , and R arm - and then I am pumping about 3 or more hours a night -  I do not have time to do anyting else - some mornings I have to do it to before going to work - or if I am goint to be active I have to pump afterward s - I gained about 30lbs  since I seen you last    Patient Stated Goals I just want my life back - or time that I can sit and think what I need to make changes that  I can  feel better , be healthier and loose weight              LYMPHEDEMA/ONCOLOGY QUESTIONNAIRE - 04/12/15 1846    Type   Cancer Type R breast CA   Surgeries   Mastectomy Date --  2012   Date Lymphedema/Swelling Started   Date --  2013   Treatment   Past Radiation Treatment --  2013   What  other symptoms do you have   Are you Having Heaviness or Tightness Yes   Are you having Pain Yes   Is there Decreased scar mobility Yes   Lymphedema Stage   Stage STAGE 2 SPONTANEOUSLY IRREVERSIBLE   Right Upper Extremity Lymphedema   15 cm Proximal to Olecranon Process 36 cm   10 cm Proximal to Olecranon Process 35.2 cm   Olecranon Process 28.9 cm   15 cm Proximal to Ulnar Styloid Process 27.9 cm   10 cm Proximal to Ulnar Styloid Process 23.5 cm   Just Proximal to Ulnar Styloid Process 16.5 cm   Across Hand at [employer] 19 cm   At [place] of 2nd Digit 6.5 cm   At [facility] of Thumb 6.5 cm   Left Upper Extremity Lymphedema   15 cm Proximal to Olecranon Process 33 cm   10 cm Proximal to Olecranon Process 30.6 cm   Olecranon Process 27.2 cm   15 cm Proximal to Ulnar Styloid Process 25.9 cm   10 cm Proximal to Ulnar Styloid Process 22.2 cm   Just Proximal to Ulnar Styloid Process 17.4 cm   Across  Hand at [employer] 19.2 cm   At [place] of 2nd Digit 6.5 cm   At [facility] of Thumb 6.8 cm   Right Lower Extremity Lymphedema   20 cm Proximal to Suprapatella 62 cm   10 cm Proximal to Suprapatella 51 cm   At Midpatella/Popliteal Crease 38.5 cm   30 cm Proximal to Floor at Lateral Plantar Foot 41.6 cm   20 cm Proximal to Floor at Lateral Plantar Foot 33.8 1   10  cm Proximal to Floor at Lateral Malleoli 24.5 cm   Circumference of ankle/heel 30 cm.   5 cm Proximal to 1st MTP Joint 21.5 cm   Across MTP Joint 21.5 cm   Around Proximal Great Toe 8 cm   Left Lower Extremity Lymphedema   20 cm Proximal to Suprapatella 65 cm   10 cm Proximal to Suprapatella 49.5 cm   At Midpatella/Popliteal Crease 37 cm   30 cm Proximal to Floor at Lateral Plantar Foot 41.5 cm   20 cm Proximal to Floor at Lateral Plantar Foot 33.3 cm   10 cm Proximal to Floor at Lateral Malleoli 23.3 cm   Circumference of ankle/heel 30.4 cm.   5 cm Proximal to 1st MTP Joint 22 cm   Across MTP Joint 22 cm   Around Proximal  Great Toe 8.5 cm      Pt works at [employer] at [employer] wear she works about 10-12 hrs days - sitting at computer, phone 75% of time.  Her hobbies use to be prior to breast CA hiking, bicycle, kayak, ski and visiting with family and friends. Had no lymphedema issue. Oct 2012 diagnose with R breast CA , Dec 2012 R mastectomy - with 20 axillary ln removed - had 9 wks drain in - then Febr 2013 had chemo 6 treatments every 3 wks - followed by radiation. She developed R UE and thoracic lymphedema during chemo - was treated and wore 24hrs day compression and use Flexitouch pump every night.  In July 2014 she developed Bilateral LE lymphedema after she had reaction to Zoladex shot. She wear [employer] Thigh high compression stockings on bilateral LE and use Flexitouch pump every night - but need to do extra session after she did any increase activity or exercise.  She cannot tolerate night time compression on bilateral LE because she wears already one on there R UE and she did had in past episodes of claustrophobia.  After her work day of 10-12 hrs - she needs to spend about 3-4 hrs using her Flexitouch pump to maintain her lymphedema in R UE , bilateral LE and thoracic. She do not have time for anything else and if she attempts to work out, she has increase lymphedema and needs to pump extra session afterwards.  Pt present this date for evaluation of her lymphedema - and was compare to her last measurements of Oct 2015 . Because of her life style, work and taking care of her lymphedema - pt report she had increase of about 30 lbs in weight.  Compare to October 2015 - her measurements - she increased in bilateral LE  R 1.2 to 2.6 at calf; 1.2 to 2 in thigh L .5 to .8 foot and ankle; 0.2 to 2 cm in calf and 0.8 to 3 cm in thigh  Compare to October 2015 - her R UE measurements:  Increased only in upper arm .7 to 1.7 cm   Interesting wise she did increase also in her L hand  to forearm 0.3 to 1  cm - she did had history of CTS prior to getting the Flexitouch pump - and is using her L hand more because of compression on her R UE 24 hrs a day.  Pt's knees measurements were the only ones that decrease with about 1- 1.3 cm - it could be that with her last compression garments she got full compression at the knees and that can cause tourniquet affect and /or irritation.   Her lymphedema is just taking over her whole life -physically and also "mentally wearing her down". She do not have any time to work out , take care of her health, do anything for relaxation or time with family/friends. 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 With her increase discomfort of thoracic and bilateral lymphedema she had to start doing monthly massages that she needs to pay out of pocket.  Pt report after work and at night arm and legs ache. About 6/10 at night time - will take 1/4 of Percocet then. Using the pump do not help for achiness - most of time no pain during day  - pain after cleaning house or yard work.                   OT Education - 04/12/15 2044    Education provided Yes   Education Details Lymphedema , findings    Person(s) Educated Patient   Methods Explanation;Demonstration;Verbal cues   Comprehension Verbalized understanding             OT Long Term Goals - 04/12/15 2048    OT LONG TERM GOAL #1   Title Pt to be ind in wearing new compression garments for bilateral LE to maintain circumference    Baseline Bilateral LE increase by 1.2 to 2.6 on R  calf and thigh , L .2 to 3 cm calf and tigh; foot .8   Time 4   Period Weeks   Status New               Plan - 04/12/15 2046    Pt will benefit from skilled therapeutic intervention in order to improve on the following deficits (Retired) Increased edema;Decreased activity tolerance   Rehab Potential Fair   OT Frequency Biweekly   OT Duration 4 weeks   OT  Treatment/Interventions Self-care/ADL training;DME and/or AE instruction;Patient/family education;Manual lymph drainage   OT Home Exercise Plan see pt instruction   Consulted and Agree with Plan of Care Patient        Problem List Patient Active Problem List   Diagnosis Date Noted  . Hypokalemia 11/30/2014  . Breast CA ([place]) 10/03/2014  . Breast pain 10/03/2014  . Accumulation of fluid in tissues 10/03/2014  . Hypercholesteremia 10/03/2014  . Acquired lymphedema 10/03/2014  . Menopausal and perimenopausal disorder 10/03/2014  . Borderline diabetes 10/03/2014  . Abnormal weight gain 10/03/2014  . Ventricular pre-excitation with arrhythmia 10/03/2014  . GERD (gastroesophageal reflux disease) 09/22/2014  . Anxiety 09/11/2014  . 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 Lymphedema of leg 01/04/2014  . History of asthma 10/29/2011  . Menopause praecox 09/10/2011  . Lymphedema of arm 09/10/2011  . Abnormal chest sounds 09/10/2011  . Infiltrating ductal carcinoma of breast, stage 3 ([place]) 01/01/2011  . Headache, migraine 06/18/2006    Rosalyn Gess OTR/L,CLT 04/13/2015, 2:52 PM  [facility] [address]  [place], [place], [address] Phone: [phone]   Fax:  [phone]  Name: Sabrina Morris MRN: [id number] Date of Birth: [birth year]

## 2015-04-16 NOTE — Telephone Encounter (Signed)
LMTCB Emily Drozdowski, CMA  

## 2015-04-18 ENCOUNTER — Ambulatory Visit: Payer: Self-pay | Admitting: Occupational Therapy

## 2015-04-20 ENCOUNTER — Ambulatory Visit (INDEPENDENT_AMBULATORY_CARE_PROVIDER_SITE_OTHER): Payer: Managed Care, Other (non HMO) | Admitting: Family Medicine

## 2015-04-20 ENCOUNTER — Encounter: Payer: Self-pay | Admitting: Family Medicine

## 2015-04-20 VITALS — BP 106/76 | HR 100 | Temp 98.7°F | Resp 16 | Ht 65.5 in | Wt 216.0 lb

## 2015-04-20 DIAGNOSIS — E7800 Pure hypercholesterolemia, unspecified: Secondary | ICD-10-CM | POA: Diagnosis not present

## 2015-04-20 DIAGNOSIS — Z0000 Encounter for general adult medical examination without abnormal findings: Secondary | ICD-10-CM | POA: Diagnosis not present

## 2015-04-20 DIAGNOSIS — L409 Psoriasis, unspecified: Secondary | ICD-10-CM | POA: Diagnosis not present

## 2015-04-20 DIAGNOSIS — R140 Abdominal distension (gaseous): Secondary | ICD-10-CM | POA: Diagnosis not present

## 2015-04-20 DIAGNOSIS — R7303 Prediabetes: Secondary | ICD-10-CM | POA: Diagnosis not present

## 2015-04-20 DIAGNOSIS — I890 Lymphedema, not elsewhere classified: Secondary | ICD-10-CM | POA: Diagnosis not present

## 2015-04-20 DIAGNOSIS — Z1211 Encounter for screening for malignant neoplasm of colon: Secondary | ICD-10-CM | POA: Diagnosis not present

## 2015-04-20 MED ORDER — SPIRONOLACTONE 25 MG PO TABS: 25.0000 mg | ORAL_TABLET | Freq: Every day | ORAL | Status: DC

## 2015-04-20 MED ORDER — FLUOCINONIDE 0.05 % EX SOLN: 1.0000 "application " | Freq: Two times a day (BID) | CUTANEOUS | Status: DC

## 2015-04-20 NOTE — Progress Notes (Signed)
Patient ID: Sabrina Morris, female   DOB: 06-Dec-1965, 50 y.o.   MRN: [id number]       Patient: Sabrina Morris, Female    DOB: May 21, 1965, 50 y.o.   MRN: [id number] Visit Date: 04/20/2015  Today's Provider: Margarita Rana, MD   Chief Complaint  Patient presents with  . Annual Exam   Subjective:    Annual physical exam Sabrina Morris is a 50 y.o. female who presents today for health maintenance and complete physical. She feels fairly well. She reports exercising none. She reports she is sleeping fairly well.  02/2012 Pap, pt has an appt with GYN on May 25, 2015 Dr. Marijean Bravo at [facility] 08/29/14 Mammo-BI-RADS 2 ----------------------------------------------------------------- Lymphedema follow-up: Patient complains of edema worsening. Patient reports edema is present in RUE, BLE, trunk, abdomen, chest, neck and face. Patient is having to pump daily 6 hours. Patient uses pump 3 hours in the morning and 3 hours at night. Edema is aggravated by daily activites. Edema is present daily. Really impacting her life negatively. Does not even have time to sleep secondary to the amount of time she needs to use the pump.  Does not think she can continue to work secondary to these problems.       Review of Systems  Constitutional: Positive for activity change and fatigue.  HENT: Positive for facial swelling and rhinorrhea.   Eyes: Negative.   Respiratory: Positive for shortness of breath.   Cardiovascular: Positive for leg swelling.  Gastrointestinal: Positive for abdominal distention.  Endocrine: Negative.   Genitourinary: Negative.        Vaginal edema  Musculoskeletal: Positive for myalgias, back pain, joint swelling and arthralgias.  Skin: Negative.   Allergic/Immunologic: Negative.   Neurological: Positive for headaches.  Hematological: Negative.   Psychiatric/Behavioral: Negative.     Social History      She  reports that she has never smoked. She has never used smokeless tobacco. She reports that  she does not drink alcohol or use illicit drugs.       Social History   Social History  . Marital Status: Single    Spouse Name: N/A  . Number of Children: N/A  . Years of Education: N/A   Occupational History  . 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 Smoking status: Never Smoker   . Smokeless tobacco: Never Used  . Alcohol Use: No     Comment: Very rarely  . Drug Use: No  . Sexual Activity: Not Asked   Other Topics Concern  . None   Social History Narrative    History reviewed. No pertinent past medical history.   Patient Active Problem List   Diagnosis Date Noted  . Hypokalemia 11/30/2014  . Breast CA ([place]) 10/03/2014  . Breast pain 10/03/2014  . Accumulation of fluid in tissues 10/03/2014  . Hypercholesteremia 10/03/2014  . Acquired lymphedema 10/03/2014  . Menopausal and perimenopausal disorder 10/03/2014  . Borderline diabetes 10/03/2014  . Abnormal weight gain 10/03/2014  . Ventricular pre-excitation with arrhythmia 10/03/2014  . GERD (gastroesophageal reflux disease) 09/22/2014  . Anxiety 09/11/2014  . 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 Lymphedema of leg 01/04/2014  . History of asthma 10/29/2011  . Menopause praecox 09/10/2011  . Lymphedema of arm 09/10/2011  . Abnormal chest sounds 09/10/2011  . Infiltrating ductal carcinoma of breast, stage 3 ([place]) 01/01/2011  . Headache, migraine 06/18/2006    Past Surgical History  Procedure Laterality  Date  . Mastectomy Right 02/2011  . Cardiac catheterization  1994  . Breast enhancement surgery Bilateral 2005    Family History        No family status information on file.        Her She was adopted. Family history is unknown by patient.    Allergies  Allergen Reactions  . Sumatriptan Other (See Comments) and Shortness Of Breath  . Sulfa Antibiotics   . Zoladex  [Goserelin]   . Hydrocodone-Acetaminophen Rash    Hyperactivity    Previous Medications    BUPROPION (WELLBUTRIN SR) 150 MG 12 HR TABLET    Take 1 tablet by mouth daily.   CHOLECALCIFEROL (D 5000) 5000 UNITS TABS    Take 1 tablet by mouth daily.    CYCLOBENZAPRINE (FLEXERIL) 5 MG TABLET    Take 1 tablet by mouth as needed.   FUROSEMIDE (LASIX) 20 MG TABLET    Take 1 tablet by mouth 2 (two) times daily.    LORAZEPAM (ATIVAN) 1 MG TABLET    Take 1 tablet (1 mg total) by mouth 2 (two) times daily. As needed   MELOXICAM (MOBIC) 7.5 MG TABLET    Take 1 tablet (7.5 mg total) by mouth 2 (two) times daily. As needed   OMEPRAZOLE-SODIUM BICARBONATE (ZEGERID) 20-1100 MG CAPS CAPSULE    Take 1 capsule by mouth daily.   OXYCODONE-ACETAMINOPHEN (PERCOCET/ROXICET) 5-325 MG TABLET    Take 1-2 tablets by mouth every 8 (eight) hours as needed.    POTASSIUM CHLORIDE (K-DUR,KLOR-CON) 10 MEQ TABLET    Take 1 tablet (10 mEq total) by mouth daily.   RIZATRIPTAN (MAXALT) 10 MG TABLET    Take 1 tablet (10 mg total) by mouth as needed.    Patient Care Team: Margarita Rana, MD as PCP - General (Family Medicine)     Objective:   Vitals: BP 106/76 mmHg  Pulse 100  Temp(Src) 98.7 F (37.1 C) (Oral)  Resp 16  Ht 5' 5.5" (1.664 m)  Wt 216 lb (97.977 kg)  BMI 35.38 kg/m2  SpO2 98%   Physical Exam  Constitutional: She is oriented to person, place, and time. She appears well-developed and well-nourished.  HENT:  Head: Normocephalic and atraumatic.  Right Ear: Tympanic membrane, external ear and ear canal normal.  Left Ear: Tympanic membrane, external ear and ear canal normal.  Nose: Nose normal.  Mouth/Throat: Uvula is midline, oropharynx is clear and moist and mucous membranes are normal.  Eyes: Conjunctivae, EOM and lids are normal. Pupils are equal, round, and reactive to light.  Neck: Trachea normal and normal range of motion. Neck supple. Carotid bruit is not present. No thyroid mass and no thyromegaly present.  Cardiovascular: Normal rate, regular rhythm and normal heart sounds.     Pulmonary/Chest: Effort normal and breath sounds normal.  Right breast mastectomy   Abdominal: Soft. Normal appearance and bowel sounds are normal. There is no hepatosplenomegaly. There is no tenderness.  Genitourinary: No breast swelling, tenderness or discharge.  Musculoskeletal: Normal range of motion.  Lymphadenopathy:    She has no cervical adenopathy.    She has no axillary adenopathy.  Neurological: She is alert and oriented to person, place, and time. She has normal strength. No cranial nerve deficit.  Skin: Skin is warm, dry and intact.  Psychiatric: She has a normal mood and affect. Her speech is normal and behavior is normal. Judgment and thought content normal. Cognition and memory are normal.     Depression Screen PHQ  2/9 Scores 04/20/2015  PHQ - 2 Score 0      Assessment & Plan:     Routine Health Maintenance and Physical Exam  Exercise Activities and Dietary recommendations Goals    None     1. Annual physical exam As noted. Unable to exercise secondary to worsening edema, work and 6 hours on pump daily, 2 hours per limb.     2. Acquired lymphedema Using lymphedema pump 2 times daily an hour per limb.  Will also check labs and start spironolactone.  Also filled out short term disability paperwork.   - Comprehensive metabolic panel - spironolactone (ALDACTONE) 25 MG tablet; Take 1 tablet (25 mg total) by mouth daily.  Dispense: 30 tablet; Refill: 5  3. Abdominal bloating Will check abdominal and pelvic ultrasound.  - US Abdomen Complete; Future  4. Psoriasis Refill medication.  - fluocinonide (LIDEX) 0.05 % external solution; Apply 1 application topically 2 (two) times daily.  Dispense: 60 mL; Refill: 3  5. Borderline diabetes Check labs.  - Hemoglobin A1c - TSH  6. Hypercholesteremia Check labs.  - CBC with Differential/Platelet - Lipid Panel With LDL/HDL Ratio  7. Colon cancer screening Will schedule referral.  - Ambulatory referral to  Gastroenterology   Patient was seen and examined by Jerrell Belfast, MD, and note scribed by Lynford Humphrey, [place].   I have reviewed the document for accuracy and completeness and I agree with above. Jerrell Belfast, MD   Margarita Rana, MD   --------------------------------------------------------------------

## 2015-04-21 ENCOUNTER — Encounter: Payer: Self-pay | Admitting: Family Medicine

## 2015-04-21 LAB — CBC WITH DIFFERENTIAL/PLATELET
BASOS: 1 %
Basophils Absolute: 0 10*3/uL (ref 0.0–0.2)
EOS (ABSOLUTE): 0.1 10*3/uL (ref 0.0–0.4)
EOS: 2 %
HEMATOCRIT: 43.7 % (ref 34.0–46.6)
Hemoglobin: 14.6 g/dL (ref 11.1–15.9)
IMMATURE GRANS (ABS): 0 10*3/uL (ref 0.0–0.1)
IMMATURE GRANULOCYTES: 0 %
LYMPHS: 23 %
Lymphocytes Absolute: 1.6 10*3/uL (ref 0.7–3.1)
MCH: 30.4 pg (ref 26.6–33.0)
MCHC: 33.4 g/dL (ref 31.5–35.7)
MCV: 91 fL (ref 79–97)
Monocytes Absolute: 0.5 10*3/uL (ref 0.1–0.9)
Monocytes: 8 %
NEUTROS PCT: 66 %
Neutrophils Absolute: 4.4 10*3/uL (ref 1.4–7.0)
PLATELETS: 256 10*3/uL (ref 150–379)
RBC: 4.8 x10E6/uL (ref 3.77–5.28)
RDW: 13.8 % (ref 12.3–15.4)
WBC: 6.7 10*3/uL (ref 3.4–10.8)

## 2015-04-21 LAB — COMPREHENSIVE METABOLIC PANEL
ALBUMIN: 4.4 g/dL (ref 3.5–5.5)
ALK PHOS: 99 IU/L (ref 39–117)
ALT: 19 IU/L (ref 0–32)
AST: 20 IU/L (ref 0–40)
Albumin/Globulin Ratio: 1.6 (ref 1.1–2.5)
BUN/Creatinine Ratio: 17 (ref 9–23)
BUN: 17 mg/dL (ref 6–24)
Bilirubin Total: 0.4 mg/dL (ref 0.0–1.2)
CHLORIDE: 101 mmol/L (ref 96–106)
CO2: 22 mmol/L (ref 18–29)
CREATININE: 0.98 mg/dL (ref 0.57–1.00)
Calcium: 9.6 mg/dL (ref 8.7–10.2)
GFR calc non Af Amer: 67 mL/min/{1.73_m2} (ref >59)
GFR, EST AFRICAN AMERICAN: 78 mL/min/{1.73_m2} (ref >59)
GLUCOSE: 102 mg/dL — AB (ref 65–99)
Globulin, Total: 2.7 g/dL (ref 1.5–4.5)
Potassium: 4.5 mmol/L (ref 3.5–5.2)
Sodium: 139 mmol/L (ref 134–144)
TOTAL PROTEIN: 7.1 g/dL (ref 6.0–8.5)

## 2015-04-21 LAB — LIPID PANEL WITH LDL/HDL RATIO
CHOLESTEROL TOTAL: 214 mg/dL — AB (ref 100–199)
HDL: 48 mg/dL (ref >39)
LDL Calculated: 137 mg/dL — ABNORMAL HIGH (ref 0–99)
LDL/HDL RATIO: 2.9 ratio (ref 0.0–3.2)
TRIGLYCERIDES: 144 mg/dL (ref 0–149)
VLDL Cholesterol Cal: 29 mg/dL (ref 5–40)

## 2015-04-21 LAB — TSH: TSH: 3.97 u[IU]/mL (ref 0.450–4.500)

## 2015-04-21 LAB — HEMOGLOBIN A1C
Est. average glucose Bld gHb Est-mCnc: 126 mg/dL
HEMOGLOBIN A1C: 6 % — AB (ref 4.8–5.6)

## 2015-04-23 NOTE — Telephone Encounter (Signed)
LMTCB. sd  

## 2015-04-23 NOTE — Telephone Encounter (Signed)
Advised patient of results.  

## 2015-04-23 NOTE — Telephone Encounter (Signed)
-----   Message from Margarita Rana, MD sent at 04/22/2015  7:26 PM EST ----- Labs stable. Blood sugar slightly above normal at 6.0. Also, cholesterol slightly elevated, but not enough to start medication. Eat healthy and exercise and recheck annually. Thanks.

## 2015-04-24 ENCOUNTER — Encounter: Payer: Self-pay | Admitting: Family Medicine

## 2015-04-25 ENCOUNTER — Other Ambulatory Visit: Payer: Self-pay | Admitting: Family Medicine

## 2015-04-25 ENCOUNTER — Telehealth: Payer: Self-pay

## 2015-04-25 ENCOUNTER — Ambulatory Visit
Admission: RE | Admit: 2015-04-25 | Discharge: 2015-04-25 | Disposition: A | Discharge status: Home / Self Care | Payer: Managed Care, Other (non HMO) | Source: Ambulatory Visit | Attending: Family Medicine | Admitting: Family Medicine

## 2015-04-25 ENCOUNTER — Telehealth: Payer: Self-pay | Admitting: Family Medicine

## 2015-04-25 DIAGNOSIS — R109 Unspecified abdominal pain: Secondary | ICD-10-CM | POA: Insufficient documentation

## 2015-04-25 DIAGNOSIS — R140 Abdominal distension (gaseous): Secondary | ICD-10-CM | POA: Insufficient documentation

## 2015-04-25 NOTE — Telephone Encounter (Signed)
Pt states she is returning a call.  [id number]

## 2015-04-25 NOTE — Telephone Encounter (Signed)
LMTCB 04/25/2015  Thanks,   -Mickel Baas

## 2015-04-25 NOTE — Telephone Encounter (Signed)
Pt advised-aa 

## 2015-04-25 NOTE — Telephone Encounter (Signed)
Pt is returning call.  [id number]

## 2015-04-25 NOTE — Telephone Encounter (Signed)
Did you call her?  Thanks,   -Mickel Baas

## 2015-04-25 NOTE — Telephone Encounter (Signed)
-----   Message from Margarita Rana, MD sent at 04/25/2015  1:45 PM EST ----- Normal ultrasound except for probable fatty liver which is related to weight gain.  Please notify patient. Thanks.

## 2015-04-25 NOTE — Telephone Encounter (Signed)
Did not call her, but did leave message in nurse box regarding her ultrasounds.  Also, is this possibly related to her paperwork?  Thanks.

## 2015-04-25 NOTE — Addendum Note (Signed)
Addended by: Ashley Royalty E on: 04/25/2015 08:13 AM   Modules accepted: Orders

## 2015-04-27 ENCOUNTER — Encounter: Payer: Self-pay | Admitting: Family Medicine

## 2015-04-30 ENCOUNTER — Other Ambulatory Visit: Payer: Self-pay | Admitting: Family Medicine

## 2015-04-30 ENCOUNTER — Other Ambulatory Visit: Payer: Self-pay

## 2015-04-30 DIAGNOSIS — R944 Abnormal results of kidney function studies: Secondary | ICD-10-CM

## 2015-04-30 DIAGNOSIS — R9389 Abnormal findings on diagnostic imaging of other specified body structures: Secondary | ICD-10-CM

## 2015-05-08 ENCOUNTER — Other Ambulatory Visit: Payer: Self-pay | Admitting: Family Medicine

## 2015-05-08 DIAGNOSIS — F419 Anxiety disorder, unspecified: Secondary | ICD-10-CM

## 2015-05-15 ENCOUNTER — Encounter: Payer: Self-pay | Admitting: Family Medicine

## 2015-05-29 ENCOUNTER — Encounter: Payer: Self-pay | Admitting: Family Medicine

## 2015-05-29 ENCOUNTER — Telehealth: Payer: Self-pay | Admitting: *Deleted

## 2015-05-29 ENCOUNTER — Telehealth: Payer: Self-pay | Admitting: Family Medicine

## 2015-05-29 ENCOUNTER — Telehealth: Payer: Self-pay | Admitting: Hematology and Oncology

## 2015-05-29 MED ORDER — PHENTERMINE HCL 30 MG PO CAPS: 30.0000 mg | ORAL_CAPSULE | ORAL | Status: DC

## 2015-05-29 NOTE — Telephone Encounter (Signed)
Pt called to request appt with medical oncologist for breast cancer treatment. Pt relate she will be a 5 yr survivor in October. Currently receiving f/u treatment at [facility]. Had right mastectomy in 2012. Pt relate she feels [facility] "is not listening to me". Has lymphedema and is 100lbs heavier. She is taking a pre-diabetic class a [facility]. Discussed "healthly living" class and out pt lymphedema specialist in out pt rehab.  Discussed and confirmed appt to see Dr. Lindi Adie on 3/16 at 3:45. Pt knows to come in early to fill out her intake form.

## 2015-05-29 NOTE — Telephone Encounter (Signed)
Patient scheduled for 03/16 @ 3:45 w/Dr. Lindi Adie.

## 2015-05-29 NOTE — Telephone Encounter (Signed)
Called into pharmacy. Informed  Pt. [name], CMA

## 2015-05-29 NOTE — Telephone Encounter (Signed)
Pt isd returning Emily's call.  [id number]

## 2015-05-29 NOTE — Telephone Encounter (Signed)
Spoke with pt regarding phentermine rx. 4 week fu scheduled. [name], CMA

## 2015-05-29 NOTE — Telephone Encounter (Signed)
Pt stated that she doesn't feel her body can handle the weight gain since her chemo and wanted to try Phentermine and would like it sent to [facility]. Pt stated that she hasn't tried it before. I advised pt that she may need an OV. Pt stated that Dr. Venia Minks could call her. Please advise. Thanks TNP

## 2015-05-29 NOTE — Telephone Encounter (Signed)
LMTCB Lotus Santillo Drozdowski, CMA  

## 2015-05-29 NOTE — Telephone Encounter (Signed)
Sent in rx.  Make sure patient understands can increase blood pressure and cause anxiety. Stop if any side effects.   Recheck ov in 4 weeks. Thanks.

## 2015-05-30 ENCOUNTER — Encounter: Payer: Self-pay | Admitting: Family Medicine

## 2015-05-30 ENCOUNTER — Other Ambulatory Visit: Payer: Self-pay | Admitting: Obstetrics and Gynecology

## 2015-05-30 ENCOUNTER — Telehealth: Payer: Self-pay | Admitting: Family Medicine

## 2015-05-30 DIAGNOSIS — Z124 Encounter for screening for malignant neoplasm of cervix: Secondary | ICD-10-CM

## 2015-05-30 DIAGNOSIS — Z13820 Encounter for screening for osteoporosis: Secondary | ICD-10-CM

## 2015-05-30 HISTORY — DX: Encounter for screening for malignant neoplasm of cervix: Z12.4

## 2015-05-30 MED ORDER — PHENTERMINE HCL 37.5 MG PO CAPS: 37.5000 mg | ORAL_CAPSULE | ORAL | Status: DC

## 2015-05-30 NOTE — Telephone Encounter (Signed)
Pt contacted office for refill request on the following medications:  phentermine.  Pt is requesting this resent for 15mg .  [place].  [id number]

## 2015-05-30 NOTE — Telephone Encounter (Signed)
Printed.  Please notify patient. Thanks.  

## 2015-05-30 NOTE — Telephone Encounter (Signed)
Pt states per her pharmacy it would be better for insurance if the dosage is 37.5 tablet-1 tablet a day and the suggest she cut the tablet in 1/2 so she would only take a 1/2 a tablet a day/MW

## 2015-05-31 ENCOUNTER — Encounter: Payer: Self-pay | Admitting: Hematology and Oncology

## 2015-05-31 ENCOUNTER — Ambulatory Visit (HOSPITAL_BASED_OUTPATIENT_CLINIC_OR_DEPARTMENT_OTHER): Payer: Managed Care, Other (non HMO) | Admitting: Hematology and Oncology

## 2015-05-31 DIAGNOSIS — Z853 Personal history of malignant neoplasm of breast: Secondary | ICD-10-CM

## 2015-05-31 DIAGNOSIS — I890 Lymphedema, not elsewhere classified: Secondary | ICD-10-CM | POA: Diagnosis not present

## 2015-05-31 DIAGNOSIS — C50511 Malignant neoplasm of lower-outer quadrant of right female breast: Secondary | ICD-10-CM

## 2015-05-31 NOTE — Telephone Encounter (Signed)
Pt advised.   Thanks,   -Laura  

## 2015-05-31 NOTE — Assessment & Plan Note (Addendum)
Right breast cancer T3 N1 M0 stage IIIa invasive ductal carcinoma ER/PR positive HER-2 negative status post mastectomy December 2012, followed by 6 cycles of TAC completed June 2013 followed by radiation July 2013, started tamoxifen September 2013, started Zoladex January 2014, discontinued all antiestrogen therapy January 2015  Tamoxifen toxicities: Profound anasarca and severe weight gain from fluid buildup Lymphedema management: Patient undergoes manual lymphedema drainage procedures every day as well as sleep and lymphedema pumps to help with her lymphedema issues. Current treatment of swelling: Lasix 20 by mouth twice a day, phentermine, Adipex Menopausal symptoms: Patient attributes a swelling to menopausal symptoms. She is begging to give her estrogen replacement therapy. She tells me that she had moments where she felt that her current life is not worth living. She had been to see gynecology at [facility] clinic Dr. Charlcie Cradle who has started her on phentermine and Adipex. Apparently she has lost 5 pounds on this medicine for a week.  Estrogen replacement therapy: I agree with all the other oncologist that estrogen replacement therapy could place her at risk of recurrence from breast cancer standpoint. However the patient is willing to risk breast cancer recurrence for the benefit of improvement of her menopausal symptoms.  I recommended that she try the current treatment being prescribed by her gynecology and if it does not work, it would be reasonable to treat her with estrogen replacement therapy and try to do surveillance for breast cancer by close watchful monitoring. However I will not know how to prescribe estrogen replacement therapy and I would support treatment if she gets it prescribed by her gynecologist Dr.Nowells if she were to begin her ERT. I will support the patient by doing active surveillance.  Return to clinic after mammograms to be done after 09/04/2015.

## 2015-05-31 NOTE — Progress Notes (Signed)
[facility] CONSULT NOTE  Patient Care Team: Lorie Phenix, MD as PCP - General (Family Medicine)  CHIEF COMPLAINTS/PURPOSE OF CONSULTATION:  Breast cancer transfer of care  HISTORY OF PRESENTING ILLNESS:  Sabrina Morris 50 y.o. female is here because of diagnosis of breast cancer in 2012 when she presented with a palpable lump. She underwent left mastectomy that revealed a 7 cm tumor with 1 out of 20 lymph nodes positive. The tumor was ER/PR positive and HER-2 negative. She underwent the surgery at [facility]. She subsequently underwent adjuvant chemotherapy with Taxol Adriamycin and Cytoxan for 6 cycles. She then received adjuvant radiation therapy. She received tamoxifen between September 2013 and January 2015. She started noticing profound weight gain and felt that her whole body was getting swollen. In spite of Lasix her swelling continued and hence she discontinued the therapy. She had been to many providers and it was felt that the consensus statement was that the swelling was related to menopausal symptoms.  She had been requesting estrogen replacement therapy to treat her menopausal symptoms but for has not found a provider who prescribed estrogen replacement with a prior history of estrogen receptor positive breast cancer. She came to see [place] to discuss whether we would be willing to consider estrogen replacement therapy for her. She stated that she is willing to accept breast cancer recurrence in order to improve her symptoms that she is experiencing currently. She was started on oral phentermine and had apex both of which have led to a loss of 5 pounds recently.   I reviewed her records extensively and collaborated the history with the patient.  SUMMARY OF ONCOLOGIC HISTORY:   Breast cancer of lower-outer quadrant of right female breast (HCC)   01/01/2011 Initial Diagnosis Patient presented with palpable lump in the right breast a dumbbell-shaped tumor was identified  spanning 7 cm   02/24/2011 Surgery Right mastectomy revealed a dumbbell-shaped mass 3.5 cm and 2.5 cm with a total span of 7 cm 1/20 lymph nodes were positive ER/PR positive HER-2 negative   05/07/2011 - 09/04/2011 Chemotherapy Adjuvant chemotherapy with Taxol, Adriamycin, Cytoxan every 3 weeks 6 cycles at [facility] [email or web address])   09/19/2011 - 10/29/2011 Radiation Therapy Adjuvant radiation therapy 30 fractions including right axilla   12/01/2011 - 04/01/2013 Anti-estrogen oral therapy Adjuvant tamoxifen was started reluctantly, Zoladex was added January 2014 and antiestrogen therapy was discontinued January 2015 (profound lymphedema and anasarca )   MEDICAL HISTORY:  No past medical history on file.  SURGICAL HISTORY: Past Surgical History  Procedure Laterality Date  . Mastectomy Right 02/2011  . Cardiac catheterization  1994  . Breast enhancement surgery Bilateral 2005    SOCIAL HISTORY: Social History   Social History  . Marital Status: Single    Spouse Name: N/A  . Number of Children: N/A  . Years of Education: N/A   Occupational History  . 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 Smoking status: Never Smoker   . Smokeless tobacco: Never Used  . Alcohol Use: No     Comment: Very rarely  . Drug Use: No  . Sexual Activity: Not on file   Other Topics Concern  . Not on file   Social History Narrative    FAMILY HISTORY: Family History  Problem Relation Age of Onset  . Adopted: Yes  . Family history unknown: Yes    ALLERGIES:  is allergic to sumatriptan; sulfa antibiotics; zoladex ;  and hydrocodone-acetaminophen.  MEDICATIONS:  Current Outpatient Prescriptions  Medication Sig Dispense Refill  . buPROPion (WELLBUTRIN SR) 150 MG 12 hr tablet TAKE ONE TABLET DAILY FOR 3 DAYS, THEN TAKE ONE TABLET TWICE A DAY 60 tablet 5  . Cholecalciferol (D 5000) 5000 units TABS Take 1 tablet by mouth daily.     . cyclobenzaprine (FLEXERIL) 5 MG  tablet Take 1 tablet by mouth as needed.    . fluocinonide (LIDEX) 0.05 % external solution Apply 1 application topically 2 (two) times daily. 60 mL 3  . furosemide (LASIX) 20 MG tablet Take 1 tablet by mouth 2 (two) times daily.     Marland Kitchen LORazepam (ATIVAN) 1 MG tablet Take 1 tablet (1 mg total) by mouth 2 (two) times daily. As needed 60 tablet 5  . meloxicam (MOBIC) 7.5 MG tablet Take 1 tablet (7.5 mg total) by mouth 2 (two) times daily. As needed 60 tablet 5  . Omeprazole-Sodium Bicarbonate (ZEGERID) 20-1100 MG CAPS capsule Take 1 capsule by mouth daily.    Marland Kitchen oxyCODONE-acetaminophen (PERCOCET/ROXICET) 5-325 MG tablet Take 1-2 tablets by mouth every 8 (eight) hours as needed.     . phentermine 37.5 MG capsule Take 1 capsule (37.5 mg total) by mouth every morning. 30 capsule 0  . potassium chloride (K-DUR,KLOR-CON) 10 MEQ tablet Take 1 tablet (10 mEq total) by mouth daily. 90 tablet 3  . rizatriptan (MAXALT) 10 MG tablet Take 1 tablet (10 mg total) by mouth as needed. 10 tablet 5  . spironolactone (ALDACTONE) 25 MG tablet Take 1 tablet (25 mg total) by mouth daily. 30 tablet 5   No current facility-administered medications for this visit.    REVIEW OF SYSTEMS:   Constitutional: Denies fevers, chills or abnormal night sweats Eyes: Denies blurriness of vision, double vision or watery eyes Ears, nose, mouth, throat, and face: Denies mucositis or sore throat Respiratory: Denies cough, dyspnea or wheezes Cardiovascular: Denies palpitation, chest discomfort or lower extremity swelling Gastrointestinal:  Denies nausea, heartburn or change in bowel habits Skin: Denies abnormal skin rashes Lymphatics: Denies new lymphadenopathy or easy bruising Neurological:Denies numbness, tingling or new weaknesses Behavioral/Psych: Mood is stable, no new changes  Breast: Right mastectomy without any palpable nodularity, left breast without any lumps or nodules that is an implant and no palpable lymphadenopathy. All  other systems were reviewed with the patient and are negative.  PHYSICAL EXAMINATION: ECOG PERFORMANCE STATUS: 2 - Symptomatic, <50% confined to bed  Filed Vitals:   05/31/15 1547  BP: 138/90  Pulse: 108  Temp: 98.1 F (36.7 C)  Resp: 20   Filed Weights   05/31/15 1547  Weight: 211 lb 11.2 oz (96.026 kg)    GENERAL:alert, no distress and comfortable, Profound facial puffiness SKIN: skin color, texture, turgor are normal, no rashes or significant lesions EYES: normal, conjunctiva are pink and non-injected, sclera clear OROPHARYNX:no exudate, no erythema and lips, buccal mucosa, and tongue normal  NECK: supple, thyroid normal size, non-tender, without nodularity LYMPH: Right arm lymphedema much well controlled with lymphedema sleeve LUNGS: clear to auscultation and percussion with normal breathing effort HEART: regular rate & rhythm and no murmurs  ABDOMEN:abdomen soft, non-tender and normal bowel sounds Musculoskeletal:no cyanosis of digits and no clubbing  PSYCH: alert & oriented x 3 with fluent speech Extremities: Mild edema without pitting. NEURO: no focal motor/sensory deficits BREAST: Right mastectomy and left breast implant. No palpable axillary or supraclavicular lymphadenopathy (exam performed in the presence of a chaperone)   LABORATORY DATA:  I  have reviewed the data as listed Lab Results  Component Value Date   WBC 6.7 04/20/2015   HGB 14.0 03/14/2013   HCT 43.7 04/20/2015   MCV 91 04/20/2015   PLT 256 04/20/2015   Lab Results  Component Value Date   NA 139 04/20/2015   K 4.5 04/20/2015   CL 101 04/20/2015   CO2 22 04/20/2015    RADIOGRAPHIC STUDIES: I have personally reviewed the radiological reports and agreed with the findings in the report.  ASSESSMENT AND PLAN:  Breast cancer of lower-outer quadrant of right female breast ([place]) Right breast cancer T3 N1 M0 stage IIIa invasive ductal carcinoma ER/PR positive HER-2 negative status post mastectomy  December 2012, followed by 6 cycles of TAC completed June 2013 followed by radiation July 2013, started tamoxifen September 2013, started Zoladex January 2014, discontinued all antiestrogen therapy January 2015  Tamoxifen toxicities: Profound anasarca and severe weight gain from fluid buildup Lymphedema management: Patient undergoes manual lymphedema drainage procedures every day as well as sleep and lymphedema pumps to help with her lymphedema issues. Current treatment of swelling: Lasix 20 by mouth twice a day, phentermine, Adipex Menopausal symptoms: Patient attributes a swelling to menopausal symptoms. She is begging to give her estrogen replacement therapy. She tells me that she had moments where she felt that her current life is not worth living. She had been to see gynecology at [facility] Dr. Charlcie Cradle who has started her on phentermine and Adipex. Apparently she has lost 5 pounds on this medicine for a week.  Estrogen replacement therapy: I agree with all the other oncologist that estrogen replacement therapy could place her at risk of recurrence from breast cancer standpoint. However the patient is willing to risk breast cancer recurrence for the benefit of improvement of her menopausal symptoms.  I recommended that she try the current treatment being prescribed by her gynecology and if it does not work, it would be reasonable to treat her with estrogen replacement therapy and try to do surveillance for breast cancer by close watchful monitoring. However since I do not know how to prescribe estrogen replacement therapy, I would let Dr.Nowells prescribed estrogen replacement therapy and I would support he patient by doing active surveillance.  Return to clinic after mammograms to be done after 09/04/2015.  All questions were answered. The patient knows to call the clinic with any problems, questions or concerns.    Rulon Eisenmenger, MD 05/31/2015

## 2015-06-01 NOTE — Progress Notes (Signed)
First Menstrual Period approx age 50.  LMP 04/2011.  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  Reviewed by Dr. Lindi Adie.  History updated.  Sent to scan.

## 2015-06-05 ENCOUNTER — Encounter: Payer: Self-pay | Admitting: Family Medicine

## 2015-06-19 ENCOUNTER — Encounter: Payer: Self-pay | Admitting: Hematology and Oncology

## 2015-06-21 ENCOUNTER — Telehealth: Payer: Self-pay | Admitting: Family Medicine

## 2015-06-21 ENCOUNTER — Telehealth: Payer: Self-pay

## 2015-06-21 NOTE — Telephone Encounter (Signed)
Pt would like orders for a head and neck lymphedema garment faxed to attention Gay Filler at [employer]. Fax# [phone]. Pt request that this be sent today if possible. Please advise. Thanks TNP

## 2015-06-21 NOTE — Telephone Encounter (Signed)
Patient calling requesting to have nurse send a prescription to "[employer]" for a head and neck lymphedema garment.  Fax # is [phone]  ATT: Gay Filler.  She states that the company has sent several requests but have not received anything back.

## 2015-06-22 NOTE — Telephone Encounter (Signed)
Order faxed.   Thanks,   -Laura  

## 2015-06-22 NOTE — Telephone Encounter (Signed)
Written.  Please fax.  Thanks.

## 2015-06-25 ENCOUNTER — Other Ambulatory Visit: Payer: Self-pay | Admitting: *Deleted

## 2015-06-27 ENCOUNTER — Ambulatory Visit (INDEPENDENT_AMBULATORY_CARE_PROVIDER_SITE_OTHER): Payer: Managed Care, Other (non HMO) | Admitting: Family Medicine

## 2015-06-27 ENCOUNTER — Encounter: Payer: Self-pay | Admitting: Family Medicine

## 2015-06-27 VITALS — BP 112/78 | HR 98 | Temp 97.8°F | Resp 14 | Ht 66.0 in | Wt 211.0 lb

## 2015-06-27 DIAGNOSIS — E8989 Other postprocedural endocrine and metabolic complications and disorders: Secondary | ICD-10-CM

## 2015-06-27 DIAGNOSIS — R635 Abnormal weight gain: Secondary | ICD-10-CM

## 2015-06-27 DIAGNOSIS — C50511 Malignant neoplasm of lower-outer quadrant of right female breast: Secondary | ICD-10-CM

## 2015-06-27 DIAGNOSIS — N959 Unspecified menopausal and perimenopausal disorder: Secondary | ICD-10-CM

## 2015-06-27 DIAGNOSIS — Z780 Asymptomatic menopausal state: Secondary | ICD-10-CM

## 2015-06-27 DIAGNOSIS — I890 Lymphedema, not elsewhere classified: Secondary | ICD-10-CM | POA: Insufficient documentation

## 2015-06-27 NOTE — Progress Notes (Addendum)
Patient ID: Sabrina Morris, female   DOB: 12/24/1965, 50 y.o.   MRN: [id number]       Patient: Sabrina Morris Female    DOB: 1965/12/21   50 y.o.   MRN: [id number] Visit Date: 06/27/2015  Today's Provider: Margarita Rana, MD   Chief Complaint  Patient presents with  . Follow-up    weight check. Started Phentermine 05/29/15   Subjective:    HPI Pt is here for a 4 week follow up after starting Phentermine.When pt was seen here last OV she was 89. She has lost 6 pounds which she says is fluid but she says that she does not think it is working anymore.  She reports that it is working great for her appetite but she has not seen her weight coming down anymore. She is taking the 37.5mg  tablet daily. She is exercising about 6 days a week, she walks about 2 miles. She has not noticed any side effects to the medication.  Does not do well with counting calories.  Does not feel that she is eating too many calories. Is walking 2 miles a day.  Does not eat past eat past eight at night.   She now has new lymphedema in her face and neck. Better today. Working with her lymphedema nurse on this.  Still unable to work as she still spends many hours a day using her lymphedema pump. She is currently out on short term disability.  The burden of missing so many lymph nodes has permanently disabled her lymphatic system at this point and it is not expected to return to normal.     Allergies  Allergen Reactions  . Sumatriptan Other (See Comments) and Shortness Of Breath  . Prometrium [Progesterone Micronized] Other (See Comments)    dizziness  . Sulfa Antibiotics   . Tamoxifen Other (See Comments)    Edema, excess fluid  . Zoladex  [Goserelin]   . Hydrocodone-Acetaminophen Rash    Hyperactivity   Previous Medications   BUPROPION (WELLBUTRIN SR) 150 MG 12 HR TABLET    TAKE ONE TABLET DAILY FOR 3 DAYS, THEN TAKE ONE TABLET TWICE A DAY   CHOLECALCIFEROL (D 5000) 5000 UNITS TABS    Take 1 tablet by mouth daily.     CYCLOBENZAPRINE (FLEXERIL) 5 MG TABLET    Take 1 tablet by mouth as needed.   ESCITALOPRAM (LEXAPRO) 10 MG TABLET    Take 10 mg by mouth daily.   FLUOCINONIDE (LIDEX) 0.05 % EXTERNAL SOLUTION    Apply 1 application topically 2 (two) times daily.   FUROSEMIDE (LASIX) 20 MG TABLET    Take 1 tablet by mouth 2 (two) times daily.    LORAZEPAM (ATIVAN) 1 MG TABLET    Take 1 tablet (1 mg total) by mouth 2 (two) times daily. As needed   MELOXICAM (MOBIC) 7.5 MG TABLET    Take 1 tablet (7.5 mg total) by mouth 2 (two) times daily. As needed   OMEPRAZOLE-SODIUM BICARBONATE (ZEGERID) 20-1100 MG CAPS CAPSULE    Take 1 capsule by mouth daily.   OXYCODONE-ACETAMINOPHEN (PERCOCET/ROXICET) 5-325 MG TABLET    Take 1-2 tablets by mouth every 8 (eight) hours as needed.    PHENTERMINE 37.5 MG CAPSULE    Take 1 capsule (37.5 mg total) by mouth every morning.   POTASSIUM CHLORIDE (K-DUR,KLOR-CON) 10 MEQ TABLET    Take 1 tablet (10 mEq total) by mouth daily.   RIZATRIPTAN (MAXALT) 10 MG TABLET    Take 1 tablet (  10 mg total) by mouth as needed.   SPIRONOLACTONE (ALDACTONE) 25 MG TABLET    Take 1 tablet (25 mg total) by mouth daily.    Review of Systems  Constitutional: Negative.   HENT: Negative.   Eyes: Negative.   Respiratory: Negative.   Cardiovascular: Negative.   Gastrointestinal: Negative.   Endocrine: Negative.   Genitourinary: Negative.   Musculoskeletal: Negative.   Skin: Negative.   Allergic/Immunologic: Negative.   Neurological: Negative.   Hematological:       Lymphedema.   Psychiatric/Behavioral: Negative.     Social History  Substance Use Topics  . Smoking status: Never Smoker   . Smokeless tobacco: Never Used  . Alcohol Use: No     Comment: Very rarely   Objective:   BP 112/78 mmHg  Pulse 98  Temp(Src) 97.8 F (36.6 C) (Oral)  Resp 14  Ht 5\' 6"  (1.676 m)  Wt 211 lb (95.709 kg)  BMI 34.07 kg/m2  Physical Exam  Constitutional: She is oriented to person, place, and time. She  appears well-developed and well-nourished.  Musculoskeletal: She exhibits edema.  Sleeve on right arm.   Neurological: She is alert and oriented to person, place, and time.  Psychiatric: She has a normal mood and affect. Her behavior is normal. Judgment and thought content normal.      Assessment & Plan:     1. Menopausal and perimenopausal disorder Has been an significant problem since Tamoxifen.  Just can get the weight off or the edema to improve. Would like to try estrogen, does understand the risks. Is going to follow up with Dr. Georga Bora to manage her hormone therapy.   2. Lymphedema of both lower extremities Continue Lasix, pump and follow up with specialist.   3. Post-lymphadenectomy lymphedema of arm Did have 20 lymph nodes removed.   4. Abnormal weight gain Lost some weight on Phentermine, but not much.  Will finish what has an proceed with getting treatment for menopausal symptoms.  Does what another refill.  Will fill and monitor weight loss.   5. Lymphedema of face New problem. Will treat as above.  She will likely have to continue her treatment long term. Unlikely at this point that situation will resolve.    She has 20 nodes removed with her surgery.  She is currently out of work on short term disability. She will be unable to return to work so she has applied for long term disability which is appropriate.      Patient was seen and examined by Dr. Margarita Rana, and noted scribed by Webb Laws, [place]. I have reviewed the document for accuracy and completeness and I agree with above. - Jerrell Belfast, MD   Margarita Rana, MD  [facility]

## 2015-06-28 ENCOUNTER — Encounter: Payer: Self-pay | Admitting: Family Medicine

## 2015-07-02 ENCOUNTER — Other Ambulatory Visit: Payer: Self-pay | Admitting: Family Medicine

## 2015-07-02 DIAGNOSIS — Z780 Asymptomatic menopausal state: Secondary | ICD-10-CM | POA: Insufficient documentation

## 2015-07-02 HISTORY — DX: Asymptomatic menopausal state: Z78.0

## 2015-07-02 MED ORDER — PHENTERMINE HCL 37.5 MG PO CAPS: 37.5000 mg | ORAL_CAPSULE | ORAL | Status: DC

## 2015-07-02 NOTE — Telephone Encounter (Signed)
Please send in office note as requested. Thanks.

## 2015-07-02 NOTE — Telephone Encounter (Signed)
Printed.  Thanks.  

## 2015-07-02 NOTE — Addendum Note (Signed)
Addended by: Jerrell Belfast on: 07/02/2015 07:08 AM   Modules accepted: Orders

## 2015-07-04 ENCOUNTER — Other Ambulatory Visit: Payer: Self-pay | Admitting: Family Medicine

## 2015-07-04 DIAGNOSIS — F419 Anxiety disorder, unspecified: Secondary | ICD-10-CM

## 2015-07-04 NOTE — Telephone Encounter (Signed)
Printed, please fax or call in to pharmacy. Thank you.   

## 2015-07-04 NOTE — Telephone Encounter (Signed)
Last refill was 12/14/2014 and LOV was 06/27/2015. [name], CMA

## 2015-07-05 ENCOUNTER — Telehealth: Payer: Self-pay

## 2015-07-05 LAB — VITAMIN D 25 HYDROXY (VIT D DEFICIENCY, FRACTURES): VIT D 25 HYDROXY: 42 ng/mL (ref 30.0–100.0)

## 2015-07-05 NOTE — Telephone Encounter (Signed)
-----   Message from Margarita Rana, MD sent at 07/05/2015 11:19 AM EDT ----- VIt d is normal.  Please notify patient. Thanks.

## 2015-07-05 NOTE — Telephone Encounter (Signed)
Pt advised.   Thanks,   -Laura  

## 2015-07-05 NOTE — Telephone Encounter (Signed)
LMTCB 07/05/2015  Thanks,   -Mickel Baas

## 2015-07-11 ENCOUNTER — Other Ambulatory Visit: Payer: Self-pay | Admitting: Hematology and Oncology

## 2015-07-11 ENCOUNTER — Inpatient Hospital Stay
Admission: RE | Admit: 2015-07-11 | Discharge: 2015-07-11 | Disposition: A | Discharge status: Home / Self Care | Payer: Self-pay | Source: Ambulatory Visit | Attending: Hematology and Oncology | Admitting: Hematology and Oncology

## 2015-07-11 ENCOUNTER — Telehealth: Payer: Self-pay | Admitting: Family Medicine

## 2015-07-11 DIAGNOSIS — Z0000 Encounter for general adult medical examination without abnormal findings: Secondary | ICD-10-CM

## 2015-07-11 NOTE — Telephone Encounter (Signed)
Pt stated she needed her updated OV notes from 06/27/15 faxed for applying for long term disability with her employer. Pt stated that when it was faxed before she thinks we might have had the wrong fax#. Pt would like it faxed to Fax# [phone] attention to [place]. Pt would like to be advised when this has been sent. Thanks TNP

## 2015-07-11 NOTE — Telephone Encounter (Signed)
Please fax. Thanks

## 2015-07-13 ENCOUNTER — Encounter: Payer: Self-pay | Admitting: Family Medicine

## 2015-07-15 ENCOUNTER — Encounter: Payer: Self-pay | Admitting: Family Medicine

## 2015-07-16 ENCOUNTER — Ambulatory Visit: Payer: Managed Care, Other (non HMO) | Admitting: Family Medicine

## 2015-07-16 ENCOUNTER — Encounter: Payer: Self-pay | Admitting: Family Medicine

## 2015-07-17 ENCOUNTER — Telehealth: Payer: Self-pay | Admitting: Family Medicine

## 2015-07-17 ENCOUNTER — Ambulatory Visit: Payer: Self-pay | Admitting: Family Medicine

## 2015-07-17 NOTE — Telephone Encounter (Signed)
Ok to put in same day. Thanks.   

## 2015-07-17 NOTE — Telephone Encounter (Signed)
Pt called and canceled her 145 appt with Dr. Venia Minks for neck swelling b/c she has to pick up the kids this afternoon. Pt would like to come in another afternoon this week. Can I reschedule pt in a day slot this week? Please advise. Thanks TNP

## 2015-07-17 NOTE — Telephone Encounter (Signed)
Spoke with pt and schedule appt for 07/18/15 @ 245 pm. Thanks TNP

## 2015-07-18 ENCOUNTER — Ambulatory Visit (INDEPENDENT_AMBULATORY_CARE_PROVIDER_SITE_OTHER): Payer: Managed Care, Other (non HMO) | Admitting: Family Medicine

## 2015-07-18 ENCOUNTER — Encounter: Payer: Self-pay | Admitting: Family Medicine

## 2015-07-18 VITALS — BP 122/86 | HR 100 | Temp 98.0°F | Resp 16 | Wt 208.0 lb

## 2015-07-18 DIAGNOSIS — I890 Lymphedema, not elsewhere classified: Secondary | ICD-10-CM

## 2015-07-18 DIAGNOSIS — C50511 Malignant neoplasm of lower-outer quadrant of right female breast: Secondary | ICD-10-CM

## 2015-07-18 NOTE — Progress Notes (Signed)
Subjective:    Patient ID: Sabrina Morris, female    DOB: [birth year], 50 y.o.   MRN: [id number]  HPI  Lymphedema Pt has noticed swelling of head and neck since 05/29/2015. Pt has a H/O breast cancer in 2013, and is requesting a CT of head and neck.  Has been working with a therapist and this is a new finding.   Concerned about metastatic disease.  Have been using her lymphedema pump with head and neck garment daily.  Has been increasing her working . Has lost 3 pounds since last visit but is concerned about this finding.   Has cut down on her Phentermine.  Is trying to watch carbs and drinking water.      Review of Systems  Constitutional: Positive for diaphoresis (hot flashes due to phentermine) and fatigue (stable per pt). Negative for fever, chills, activity change, appetite change and unexpected weight change (has lost 3 pounds on phentermine).  HENT: Positive for ear pain (left).   Cardiovascular: Negative for chest pain.  Musculoskeletal: Positive for neck stiffness.  Neurological: Positive for headaches.   BP 122/86 mmHg  Pulse 100  Temp(Src) 98 F (36.7 C) (Oral)  Resp 16  Wt 208 lb (94.348 kg)   Patient Active Problem List   Diagnosis Date Noted  . Postmenopausal 07/02/2015  . Lymphedema of face 06/27/2015  . Hypokalemia 11/30/2014  . Breast CA ([place]) 10/03/2014  . Accumulation of fluid in tissues 10/03/2014  . Hypercholesteremia 10/03/2014  . Acquired lymphedema 10/03/2014  . Menopausal and perimenopausal disorder 10/03/2014  . Borderline diabetes 10/03/2014  . Abnormal weight gain 10/03/2014  . Ventricular pre-excitation with arrhythmia 10/03/2014  . GERD (gastroesophageal reflux disease) 09/22/2014  . Anxiety 09/11/2014  . 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 Lymphedema of leg 01/04/2014  . History of asthma 10/29/2011  . Menopause praecox 09/10/2011  . Post-lymphadenectomy lymphedema of arm 09/10/2011  . Breast cancer of lower-outer quadrant of right  female breast ([place]) 01/01/2011  . Headache, migraine 06/18/2006   Past Medical History  Diagnosis Date  . Breast cancer ([place])   . Lymphedema   . H/O colonoscopy     sch 06/12/15  . H/O bone density study 2015  . Pap smear for cervical cancer screening [id number]   Current Outpatient Prescriptions on File Prior to Visit  Medication Sig  . buPROPion (WELLBUTRIN SR) 150 MG 12 hr tablet TAKE ONE TABLET DAILY FOR 3 DAYS, THEN TAKE ONE TABLET TWICE A DAY  . Cholecalciferol (D 5000) 5000 units TABS Take 1 tablet by mouth daily.   . cyclobenzaprine (FLEXERIL) 5 MG tablet Take 1 tablet by mouth as needed.  Marland Kitchen escitalopram (LEXAPRO) 10 MG tablet Take 10 mg by mouth daily.  . fluocinonide (LIDEX) 0.05 % external solution Apply 1 application topically 2 (two) times daily.  . furosemide (LASIX) 20 MG tablet Take 1 tablet by mouth 2 (two) times daily.   Marland Kitchen LORazepam (ATIVAN) 1 MG tablet TAKE ONE TABLET TWICE A DAY AS NEEDED  . meloxicam (MOBIC) 7.5 MG tablet Take 1 tablet (7.5 mg total) by mouth 2 (two) times daily. As needed  . oxyCODONE-acetaminophen (PERCOCET/ROXICET) 5-325 MG tablet Take 1-2 tablets by mouth every 8 (eight) hours as needed.   . phentermine (ADIPEX-P) 37.5 MG tablet TAKE ONE TABLET EVERY MORNING  . potassium chloride (K-DUR,KLOR-CON) 10 MEQ tablet Take 1 tablet (10 mEq total) by mouth daily.  . rizatriptan (MAXALT) 10 MG tablet Take 1 tablet (10 mg total) by  mouth as needed.   No current facility-administered medications on file prior to visit.   Allergies  Allergen Reactions  . Sumatriptan Other (See Comments) and Shortness Of Breath  . Prometrium [Progesterone Micronized] Other (See Comments)    dizziness  . Sulfa Antibiotics   . Tamoxifen Other (See Comments)    Edema, excess fluid  . Zoladex  [Goserelin]   . Hydrocodone-Acetaminophen Rash    Hyperactivity   Past Surgical History  Procedure Laterality Date  . Mastectomy Right 02/2011  . Cardiac catheterization  1994    . Breast enhancement surgery Bilateral 2005   Social History   Social History  . Marital Status: Single    Spouse Name: N/A  . Number of Children: N/A  . Years of Education: N/A   Occupational History  . 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 Smoking status: Never Smoker   . Smokeless tobacco: Never Used  . Alcohol Use: No  . Drug Use: No  . Sexual Activity: Not on file   Other Topics Concern  . Not on file   Social History Narrative   Family History  Problem Relation Age of Onset  . Adopted: Yes  . Family history unknown: Yes       Objective:   Physical Exam  HENT:  Right Ear: Hearing, tympanic membrane, external ear and ear canal normal.  Left Ear: Hearing, tympanic membrane, external ear and ear canal normal.  Mouth/Throat: Oropharynx is clear and moist.  Neck:  Swelling on anterior clavicle of right side.  Edema in her face noted.  BP 122/86 mmHg  Pulse 100  Temp(Src) 98 F (36.7 C) (Oral)  Resp 16  Wt 208 lb (94.348 kg)     Assessment & Plan:  1. Lymphedema of face New finding. Will schedule imaging to rule out pathology. Continue current treatment.   - CT Soft Tissue Neck W Contrast; Future - CT Head W Contrast; Future  2. Breast cancer of lower-outer quadrant of right female breast ([place]) As above.  - CT Head W Contrast; Future   Patient seen and examined by Jerrell Belfast, MD, and note scribed by [name], CMA.  I have reviewed the document for accuracy and completeness and I agree with above. Jerrell Belfast, MD   Margarita Rana, MD

## 2015-07-19 ENCOUNTER — Telehealth: Payer: Self-pay | Admitting: Family Medicine

## 2015-07-19 DIAGNOSIS — I890 Lymphedema, not elsewhere classified: Secondary | ICD-10-CM

## 2015-07-19 DIAGNOSIS — C50511 Malignant neoplasm of lower-outer quadrant of right female breast: Secondary | ICD-10-CM

## 2015-07-19 NOTE — Telephone Encounter (Signed)
Insurance company is recommending an MRI of brain w/wo contrast instead of CT.They have denied CT of neck,soft tissue due to no mass and an ultrasound has not been done

## 2015-07-19 NOTE — Telephone Encounter (Signed)
Thanks Sarah. Please schedule and notify patient. Will answer questions she is concerned about. Thanks.

## 2015-07-20 ENCOUNTER — Encounter: Payer: Self-pay | Admitting: *Deleted

## 2015-07-20 ENCOUNTER — Telehealth: Payer: Self-pay | Admitting: *Deleted

## 2015-07-20 NOTE — Telephone Encounter (Signed)
Patient called and stated that PCP has ordered MRI of head and u/s of neck related to swelling. Wanted to know whether anything else needed to be done. Spoke with Dr. Lindi Adie and no further tests needed at this time. Left VMM to this effect and advised to call with any further questions.

## 2015-07-20 NOTE — Progress Notes (Signed)
Received office notes from [facility], reviewed by Dr. Lindi Adie, sent to scan.

## 2015-07-24 ENCOUNTER — Encounter: Payer: Self-pay | Admitting: Hematology and Oncology

## 2015-07-24 ENCOUNTER — Encounter: Payer: Self-pay | Admitting: Family Medicine

## 2015-07-25 ENCOUNTER — Other Ambulatory Visit: Payer: Self-pay | Admitting: *Deleted

## 2015-07-25 ENCOUNTER — Telehealth: Payer: Self-pay | Admitting: *Deleted

## 2015-07-25 NOTE — Telephone Encounter (Signed)
Patient called wanting to have port flush scheduled as it has not been flushed since December. I called [facility] to see if she could have it done there. They told me that they can only do flushes for patients that are seen there as the nurse must place an order by one of the physicians at [facility]. Advised patient of this and she will come for flush tomorrow.

## 2015-07-27 ENCOUNTER — Other Ambulatory Visit: Payer: Self-pay | Admitting: *Deleted

## 2015-07-27 ENCOUNTER — Ambulatory Visit (HOSPITAL_BASED_OUTPATIENT_CLINIC_OR_DEPARTMENT_OTHER): Payer: Managed Care, Other (non HMO)

## 2015-07-27 ENCOUNTER — Inpatient Hospital Stay
Admission: RE | Admit: 2015-07-27 | Discharge: 2015-07-27 | Disposition: A | Discharge status: Home / Self Care | Payer: Self-pay | Source: Ambulatory Visit | Attending: *Deleted | Admitting: *Deleted

## 2015-07-27 DIAGNOSIS — Z452 Encounter for adjustment and management of vascular access device: Secondary | ICD-10-CM | POA: Diagnosis not present

## 2015-07-27 DIAGNOSIS — Z853 Personal history of malignant neoplasm of breast: Secondary | ICD-10-CM | POA: Diagnosis not present

## 2015-07-27 DIAGNOSIS — Z9289 Personal history of other medical treatment: Secondary | ICD-10-CM

## 2015-07-27 DIAGNOSIS — I890 Lymphedema, not elsewhere classified: Secondary | ICD-10-CM

## 2015-07-27 MED ORDER — SODIUM CHLORIDE 0.9 % IJ SOLN
10.0000 mL | INTRAMUSCULAR | Status: DC | PRN
  Administered 2015-07-27: 10 mL via INTRAVENOUS
  Filled 2015-07-27: qty 10

## 2015-07-27 MED ORDER — HEPARIN SOD (PORK) LOCK FLUSH 100 UNIT/ML IV SOLN
500.0000 [IU] | Freq: Once | INTRAVENOUS | Status: AC | PRN
  Administered 2015-07-27: 500 [IU] via INTRAVENOUS
  Filled 2015-07-27: qty 5

## 2015-07-27 NOTE — Patient Instructions (Signed)

## 2015-08-02 ENCOUNTER — Encounter: Payer: Self-pay | Admitting: Family Medicine

## 2015-08-02 ENCOUNTER — Ambulatory Visit
Admission: RE | Admit: 2015-08-02 | Discharge: 2015-08-02 | Disposition: A | Discharge status: Home / Self Care | Payer: Managed Care, Other (non HMO) | Source: Ambulatory Visit | Attending: Family Medicine | Admitting: Family Medicine

## 2015-08-02 ENCOUNTER — Telehealth: Payer: Self-pay | Admitting: Hematology and Oncology

## 2015-08-02 DIAGNOSIS — I890 Lymphedema, not elsewhere classified: Secondary | ICD-10-CM

## 2015-08-02 DIAGNOSIS — C50511 Malignant neoplasm of lower-outer quadrant of right female breast: Secondary | ICD-10-CM | POA: Diagnosis not present

## 2015-08-02 NOTE — Telephone Encounter (Signed)
Returned pt call and s.w. Pt and r/s appt....the patient ok and aware of new d.t

## 2015-08-03 ENCOUNTER — Telehealth: Payer: Self-pay

## 2015-08-03 NOTE — Telephone Encounter (Signed)
Left message with Maudie Mercury at [facility] [place] for Dr. Laverle Hobby to call back. [name], CMA

## 2015-08-03 NOTE — Telephone Encounter (Signed)
-----   Message from Margarita Rana, MD sent at 08/02/2015  5:03 PM EDT ----- Please have Dr. Barbie Banner call me regarding regarding this report. Thanks.

## 2015-08-03 NOTE — Telephone Encounter (Signed)
Benign appearing lymph nodes per Dr. Laverle Hobby. [name], CMA

## 2015-08-03 NOTE — Telephone Encounter (Signed)
Pt called in wanting to remind you to check with the radiologist regarding her [place] she had done yesterday  Her  Call back is [phone]  Thanks Con Memos

## 2015-08-08 ENCOUNTER — Telehealth: Payer: Self-pay

## 2015-08-08 ENCOUNTER — Ambulatory Visit
Admission: RE | Admit: 2015-08-08 | Discharge: 2015-08-08 | Disposition: A | Discharge status: Home / Self Care | Payer: Managed Care, Other (non HMO) | Source: Ambulatory Visit | Attending: Family Medicine | Admitting: Family Medicine

## 2015-08-08 ENCOUNTER — Ambulatory Visit: Payer: Managed Care, Other (non HMO)

## 2015-08-08 DIAGNOSIS — I890 Lymphedema, not elsewhere classified: Secondary | ICD-10-CM

## 2015-08-08 DIAGNOSIS — Z853 Personal history of malignant neoplasm of breast: Secondary | ICD-10-CM | POA: Insufficient documentation

## 2015-08-08 DIAGNOSIS — C50511 Malignant neoplasm of lower-outer quadrant of right female breast: Secondary | ICD-10-CM

## 2015-08-08 MED ORDER — GADOBENATE DIMEGLUMINE 529 MG/ML IV SOLN
20.0000 mL | Freq: Once | INTRAVENOUS | Status: AC | PRN
  Administered 2015-08-08: 20 mL via INTRAVENOUS

## 2015-08-08 NOTE — Telephone Encounter (Signed)
LMTCB Emily Drozdowski, CMA  

## 2015-08-08 NOTE — Telephone Encounter (Signed)
-----   Message from Margarita Rana, MD sent at 08/08/2015  3:05 PM EDT ----- Normal head MRI. Thanks.

## 2015-08-08 NOTE — Telephone Encounter (Signed)
Pt returned call. Thanks TNP °

## 2015-08-09 NOTE — Telephone Encounter (Signed)
LMTCB Malyna Budney Drozdowski, CMA  

## 2015-08-10 ENCOUNTER — Telehealth: Payer: Self-pay | Admitting: *Deleted

## 2015-08-10 NOTE — Telephone Encounter (Signed)
"  I'd like to receive a letter when I come in on Wednesday for hormone therapy.  It does not need to be addressed to a specific doctor.  I have three different consultations in te next month.  It needs to read that he supports my choice for [facility] Hormone Replacement Therapy."  Return number if any questions is [phone].  Aware MD is off today, [facility] is closed 08-13-2015 for the Holiday but this message sent.

## 2015-08-14 ENCOUNTER — Other Ambulatory Visit: Payer: Self-pay | Admitting: Family Medicine

## 2015-08-14 ENCOUNTER — Ambulatory Visit
Admission: RE | Admit: 2015-08-14 | Discharge: 2015-08-14 | Disposition: A | Discharge status: Home / Self Care | Payer: Managed Care, Other (non HMO) | Source: Ambulatory Visit | Attending: Obstetrics and Gynecology | Admitting: Obstetrics and Gynecology

## 2015-08-14 ENCOUNTER — Other Ambulatory Visit: Payer: Self-pay | Admitting: Hematology and Oncology

## 2015-08-14 ENCOUNTER — Encounter: Payer: Self-pay | Admitting: Family Medicine

## 2015-08-14 ENCOUNTER — Ambulatory Visit
Admission: RE | Admit: 2015-08-14 | Discharge: 2015-08-14 | Disposition: A | Discharge status: Home / Self Care | Payer: Managed Care, Other (non HMO) | Source: Ambulatory Visit | Attending: Hematology and Oncology | Admitting: Hematology and Oncology

## 2015-08-14 DIAGNOSIS — Z1231 Encounter for screening mammogram for malignant neoplasm of breast: Secondary | ICD-10-CM | POA: Insufficient documentation

## 2015-08-14 DIAGNOSIS — Z9882 Breast implant status: Secondary | ICD-10-CM | POA: Diagnosis not present

## 2015-08-14 DIAGNOSIS — C50511 Malignant neoplasm of lower-outer quadrant of right female breast: Secondary | ICD-10-CM

## 2015-08-14 DIAGNOSIS — Z13820 Encounter for screening for osteoporosis: Secondary | ICD-10-CM | POA: Diagnosis present

## 2015-08-14 DIAGNOSIS — M8580 Other specified disorders of bone density and structure, unspecified site: Secondary | ICD-10-CM | POA: Diagnosis not present

## 2015-08-14 DIAGNOSIS — Z853 Personal history of malignant neoplasm of breast: Secondary | ICD-10-CM | POA: Diagnosis not present

## 2015-08-14 DIAGNOSIS — I890 Lymphedema, not elsewhere classified: Secondary | ICD-10-CM

## 2015-08-14 NOTE — Progress Notes (Signed)
Letter created per pt request giving approval for pt to start Hormone Replacement Therapy.  Per Dr. Lindi Adie, he supports her decision.  Pt scheduled for appt on 08/15/15 at which time letter will be available for pt.

## 2015-08-15 ENCOUNTER — Ambulatory Visit (HOSPITAL_BASED_OUTPATIENT_CLINIC_OR_DEPARTMENT_OTHER): Payer: Managed Care, Other (non HMO) | Admitting: Hematology and Oncology

## 2015-08-15 ENCOUNTER — Encounter: Payer: Self-pay | Admitting: Hematology and Oncology

## 2015-08-15 ENCOUNTER — Telehealth: Payer: Self-pay | Admitting: Hematology and Oncology

## 2015-08-15 ENCOUNTER — Telehealth: Payer: Self-pay | Admitting: Family Medicine

## 2015-08-15 VITALS — BP 130/92 | HR 96 | Temp 98.1°F | Resp 18 | Wt 209.7 lb

## 2015-08-15 DIAGNOSIS — I890 Lymphedema, not elsewhere classified: Secondary | ICD-10-CM

## 2015-08-15 DIAGNOSIS — N951 Menopausal and female climacteric states: Secondary | ICD-10-CM

## 2015-08-15 DIAGNOSIS — Z853 Personal history of malignant neoplasm of breast: Secondary | ICD-10-CM

## 2015-08-15 DIAGNOSIS — C50511 Malignant neoplasm of lower-outer quadrant of right female breast: Secondary | ICD-10-CM

## 2015-08-15 NOTE — Assessment & Plan Note (Signed)
Right breast cancer T3 N1 M0 stage IIIa invasive ductal carcinoma ER/PR positive HER-2 negative status post mastectomy December 2012, followed by 6 cycles of TAC completed June 2013 followed by radiation July 2013, started tamoxifen September 2013, started Zoladex January 2014, discontinued all antiestrogen therapy January 2015  Tamoxifen toxicities: Profound anasarca and severe weight gain from fluid buildup Lymphedema management: Patient undergoes manual lymphedema drainage procedures every day as well as sleep and lymphedema pumps to help with her lymphedema issues. Current treatment of swelling: Lasix 20 by mouth twice a day, phentermine, Adipex  Menopausal symptoms: Patient would like to receive estrogen replacement therapy for the menopausal symptoms. She is fully aware that she is risking breast cancer relapse by taking estrogen replacement therapy.  I believe that if we do not provide her with estrogen replacement therapy she is likely to look at the black market for estrogen replacement options. It is for this reason that I am supporting her decision to receive estrogen replacement therapy. However it is up to the gynecologist to write the prescription for the estrogen replacement therapy. I am happy to monitor her and closely evaluate for any signs or symptoms of relapse.

## 2015-08-15 NOTE — Progress Notes (Signed)
Patient Care Team: Margarita Rana, MD as PCP - General (Family Medicine)  SUMMARY OF ONCOLOGIC HISTORY:   Breast cancer of lower-outer quadrant of right female breast ([place])   01/01/2011 Initial Diagnosis Patient presented with palpable lump in the right breast a dumbbell-shaped tumor was identified spanning 7 cm   02/24/2011 Surgery Right mastectomy revealed a dumbbell-shaped mass 3.5 cm and 2.5 cm with a total span of 7 cm 1/20 lymph nodes were positive ER/PR positive HER-2 negative   05/07/2011 - 09/04/2011 Chemotherapy Adjuvant chemotherapy with Taxol, Adriamycin, Cytoxan every 3 weeks 6 cycles at [facility] ([email or web address])   09/19/2011 - 10/29/2011 Radiation Therapy Adjuvant radiation therapy 30 fractions including right axilla   12/01/2011 - 04/01/2013 Anti-estrogen oral therapy Adjuvant tamoxifen was started reluctantly, Zoladex was added January 2014 and antiestrogen therapy was discontinued January 2015 (profound lymphedema and anasarca )    CHIEF COMPLIANT: Follow-up of breast cancer  INTERVAL HISTORY: Sabrina Morris is a 50 year old with above-mentioned history of right breast cancer who was previously diagnosed and treated at [facility] who came to [place] after stopping antiestrogen therapy. She had profound lymphedema for whole body and was undergoing lymphedema drainage processes with pumps and massages. She is here for routine follow-up. She tells me that she is seeing gynecology for initiation of antiestrogen therapy.  REVIEW OF SYSTEMS:   Constitutional: Denies fevers, chills or abnormal weight loss, complains of profound lymphedema Eyes: Denies blurriness of vision Ears, nose, mouth, throat, and face: Denies mucositis or sore throat Respiratory: Denies cough, dyspnea or wheezes Cardiovascular: Denies palpitation, chest discomfort Gastrointestinal:  Denies nausea, heartburn or change in bowel habits Skin: Denies abnormal skin rashes Lymphatics: Denies new lymphadenopathy or easy  bruising Neurological:Denies numbness, tingling or new weaknesses Behavioral/Psych: Mood is stable, no new changes  Extremities: Lymphedema Breast:  denies any pain or lumps or nodules in either breasts All other systems were reviewed with the patient and are negative.  I have reviewed the past medical history, past surgical history, social history and family history with the patient and they are unchanged from previous note.  ALLERGIES:  is allergic to sumatriptan; prometrium; sulfa antibiotics; tamoxifen; zoladex ; and hydrocodone-acetaminophen.  MEDICATIONS:  Current Outpatient Prescriptions  Medication Sig Dispense Refill  . buPROPion (WELLBUTRIN SR) 150 MG 12 hr tablet TAKE ONE TABLET DAILY FOR 3 DAYS, THEN TAKE ONE TABLET TWICE A DAY 60 tablet 5  . Cholecalciferol (D 5000) 5000 units TABS Take 1 tablet by mouth daily.     . cyclobenzaprine (FLEXERIL) 5 MG tablet Take 1 tablet by mouth as needed.    Marland Kitchen escitalopram (LEXAPRO) 10 MG tablet Take 10 mg by mouth daily.    . fluocinonide (LIDEX) 0.05 % external solution Apply 1 application topically 2 (two) times daily. 60 mL 3  . furosemide (LASIX) 20 MG tablet TAKE TWO TABLETS DAILY 60 tablet 5  . LORazepam (ATIVAN) 1 MG tablet TAKE ONE TABLET TWICE A DAY AS NEEDED 60 tablet 5  . meloxicam (MOBIC) 7.5 MG tablet Take 1 tablet (7.5 mg total) by mouth 2 (two) times daily. As needed 60 tablet 5  . oxyCODONE-acetaminophen (PERCOCET/ROXICET) 5-325 MG tablet Take 1-2 tablets by mouth every 8 (eight) hours as needed.     . phentermine (ADIPEX-P) 37.5 MG tablet TAKE ONE TABLET EVERY MORNING 30 tablet 0  . potassium chloride (K-DUR,KLOR-CON) 10 MEQ tablet Take 1 tablet (10 mEq total) by mouth daily. 90 tablet 3  . rizatriptan (MAXALT) 10 MG tablet Take  1 tablet (10 mg total) by mouth as needed. 10 tablet 5   No current facility-administered medications for this visit.    PHYSICAL EXAMINATION: ECOG PERFORMANCE STATUS: 1 - Symptomatic but  completely ambulatory  Filed Vitals:   08/15/15 1436  BP: 130/92  Pulse: 96  Temp: 98.1 F (36.7 C)  Resp: 18   Filed Weights   08/15/15 1436  Weight: 209 lb 11.2 oz (95.119 kg)    GENERAL:alert, no distress and comfortable SKIN: skin color, texture, turgor are normal, no rashes or significant lesions EYES: normal, Conjunctiva are pink and non-injected, sclera clear OROPHARYNX:no exudate, no erythema and lips, buccal mucosa, and tongue normal  NECK: supple, thyroid normal size, non-tender, without nodularity LYMPH:  no palpable lymphadenopathy in the cervical, axillary or inguinal LUNGS: clear to auscultation and percussion with normal breathing effort HEART: regular rate & rhythm and no murmurs and no lower extremity edema ABDOMEN:abdomen soft, non-tender and normal bowel sounds MUSCULOSKELETAL:no cyanosis of digits and no clubbing  NEURO: alert & oriented x 3 with fluent speech, no focal motor/sensory deficits EXTREMITIES: No lower extremity edema  LABORATORY DATA:  I have reviewed the data as listed   Chemistry      Component Value Date/Time   NA 139 04/20/2015 1208   NA 140 06/22/2013 1023   K 4.5 04/20/2015 1208   K 4.0 06/22/2013 1023   CL 101 04/20/2015 1208   CL 102 06/22/2013 1023   CO2 22 04/20/2015 1208   CO2 31 06/22/2013 1023   BUN 17 04/20/2015 1208   BUN 19* 06/22/2013 1023   CREATININE 0.98 04/20/2015 1208   CREATININE 0.9 06/16/2014   CREATININE 0.86 06/22/2013 1023   GLU 92 06/16/2014      Component Value Date/Time   CALCIUM 9.6 04/20/2015 1208   CALCIUM 9.4 06/22/2013 1023   ALKPHOS 99 04/20/2015 1208   ALKPHOS 104 03/14/2013 1403   AST 20 04/20/2015 1208   AST 22 03/14/2013 1403   ALT 19 04/20/2015 1208   ALT 46 03/14/2013 1403   BILITOT 0.4 04/20/2015 1208   BILITOT 0.2 03/14/2013 1403       Lab Results  Component Value Date   WBC 6.7 04/20/2015   HGB 14.0 03/14/2013   HCT 43.7 04/20/2015   MCV 91 04/20/2015   PLT 256  04/20/2015   NEUTROABS 4.4 04/20/2015     ASSESSMENT & PLAN:  Breast cancer of lower-outer quadrant of right female breast ([place]) Right breast cancer T3 N1 M0 stage IIIa invasive ductal carcinoma ER/PR positive HER-2 negative status post mastectomy December 2012, followed by 6 cycles of TAC completed June 2013 followed by radiation July 2013, started tamoxifen September 2013, started Zoladex January 2014, discontinued all antiestrogen therapy January 2015  Tamoxifen toxicities: Profound anasarca and severe weight gain from fluid buildup Lymphedema management: Patient undergoes manual lymphedema drainage procedures every day as well as sleep and lymphedema pumps to help with her lymphedema issues. Current treatment of swelling: Lasix 20 by mouth twice a day, phentermine, Adipex  Menopausal symptoms: Patient would like to receive estrogen replacement therapy for the menopausal symptoms. She is fully aware that she is risking breast cancer relapse by taking estrogen replacement therapy.  I believe that if we do not provide her with estrogen replacement therapy she is likely to look at the black market for estrogen replacement options. It is for this reason that I am supporting her decision to receive estrogen replacement therapy. However it is up to the gynecologist  to write the prescription for the estrogen replacement therapy. I am happy to monitor her and closely evaluate for any signs or symptoms of relapse.  No orders of the defined types were placed in this encounter.   The patient has a good understanding of the overall plan. she agrees with it. she will call with any problems that may develop before the next visit here.   Rulon Eisenmenger, MD 08/15/2015

## 2015-08-15 NOTE — Telephone Encounter (Signed)
Pt calling for her mammogram results.  Thank sTeri

## 2015-08-15 NOTE — Telephone Encounter (Signed)
appt made and avs printed °

## 2015-08-16 NOTE — Telephone Encounter (Signed)
Pt aware.   Thanks,   -Mickel Baas

## 2015-08-16 NOTE — Telephone Encounter (Signed)
  Letter completed by collaborative.

## 2015-08-17 ENCOUNTER — Other Ambulatory Visit: Payer: Self-pay | Admitting: *Deleted

## 2015-08-20 ENCOUNTER — Telehealth: Payer: Self-pay

## 2015-08-20 NOTE — Telephone Encounter (Signed)
Received message from pt requesting letter created last week be electronically signed by Dr. Lindi Adie.  Called pt back to inform her it could not be signed electronically but I could fax or scan a copy to her email.  Left VM instructing pt to call me back with what would work best for her.

## 2015-08-20 NOTE — Progress Notes (Signed)
Received request from pt regarding letter.  Pt states it needs to have MD signature.  Letter recreated and signed by MD.  Letter faxed to pts email address per her request.

## 2015-08-27 ENCOUNTER — Ambulatory Visit (INDEPENDENT_AMBULATORY_CARE_PROVIDER_SITE_OTHER): Payer: Managed Care, Other (non HMO) | Admitting: Physician Assistant

## 2015-08-27 ENCOUNTER — Encounter: Payer: Self-pay | Admitting: Physician Assistant

## 2015-08-27 VITALS — BP 118/70 | HR 88 | Temp 97.6°F | Resp 16 | Wt 209.2 lb

## 2015-08-27 DIAGNOSIS — I890 Lymphedema, not elsewhere classified: Secondary | ICD-10-CM

## 2015-08-27 DIAGNOSIS — N959 Unspecified menopausal and perimenopausal disorder: Secondary | ICD-10-CM

## 2015-08-27 MED ORDER — PHENTERMINE HCL 37.5 MG PO TABS: 37.5000 mg | ORAL_TABLET | Freq: Every morning | ORAL | Status: DC

## 2015-08-27 NOTE — Patient Instructions (Signed)
Lymphedema  Lymphedema is swelling that is caused by the abnormal collection of lymph under the skin. Lymph is fluid from the tissues in your body that travels in the lymphatic system. This system is part of the immune system and includes lymph nodes and lymph vessels. The lymph vessels collect and carry the excess fluid, fats, proteins, and wastes from the tissues of the body to the bloodstream. This system also works to clean and remove bacteria and waste products from the body.  Lymphedema occurs when the lymphatic system is blocked. When the lymph vessels or lymph nodes are blocked or damaged, lymph does not drain properly, causing an abnormal buildup of lymph. This leads to swelling in the arms or legs. Lymphedema cannot be cured by medicines, but various methods can be used to help reduce the swelling.  CAUSES  There are two types of lymphedema. Primary lymphedema is caused by the absence or abnormality of the lymph vessel at birth. Secondary lymphedema is more common. It occurs when the lymph vessel is damaged or blocked. Common causes of lymph vessel blockage include:  · Skin infection, such as cellulitis.  · Infection by parasites (filariasis).  · Injury.  · Cancer.  · Radiation therapy.  · Formation of scar tissue.  · Surgery.  SYMPTOMS  Symptoms of this condition include:  · Swelling of the arm or leg.  · A heavy or tight feeling in the arm or leg.  · Swelling of the feet, toes, or fingers. Shoes or rings may fit more tightly than before.  · Redness of the skin over the affected area.  · Limited movement of the affected limb.  · Sensitivity to touch or discomfort in the affected limb.  DIAGNOSIS  This condition may be diagnosed with:  · A physical exam.  · Medical history.  · Imaging tests, such as:    Lymphoscintigraphy. In this test, a low dose of a radioactive substance is injected to trace the flow of lymph through the lymph vessels.    MRI.    CT scan.    Duplex ultrasound. This test uses sound waves  to produce images of the vessels and the blood flow on a screen.    Lymphangiography. In this test, a contrast dye is injected into the lymph vessel to help show blockages.  TREATMENT  Treatment for this condition may depend on the cause. Treatment may include:  · Exercise. Certain exercises can help fluid move out of the affected limb.  · Massage. Gentle massage of the affected limb can help move the fluid out of the area.  · Compression. Various methods may be used to apply pressure to the affected limb in order to reduce the swelling.    Wearing compression stockings or sleeves on the affected limb.    Bandaging the affected limb.    Using an external pump that is attached to a sleeve that alternates between applying pressure and releasing pressure.  · Surgery. This is usually only done for severe cases. For example, surgery may be done if you have trouble moving the limb or if the swelling does not get better with other treatments.  If an underlying condition is causing the lymphedema, treatment for that condition is needed. For example, antibiotic medicines may be used to treat an infection.  HOME CARE INSTRUCTIONS  Activities  · Exercise regularly as directed by your health care provider.  · Do not sit with your legs crossed.  · When possible, keep the affected limb   raised (elevated) above the level of your heart.  · Avoid carrying things with an arm that is affected by lymphedema.  · Remember that the affected area is more likely to become injured or infected.  · Take these steps to help prevent infection:    Keep the affected area clean and dry.    Protect your skin from cuts. For example, you should use gloves while cooking or gardening. Do not walk barefoot. If you shave the affected area, use an electric razor.  General Instructions  · Take medicines only as directed by your health care provider.  · Eat a healthy diet that includes a lot of fruits and vegetables.  · Do not wear tight clothes, shoes, or  jewelry.  · Do not use heating pads over the affected area.  · Avoid having blood pressure checked on the affected limb.  · Keep all follow-up visits as directed by your health care provider. This is important.  SEEK MEDICAL CARE IF:  · You continue to have swelling in your limb.  · You have a fever.  · You have a cut that does not heal.  · You have redness or pain in the affected area.  · You have new swelling in your limb that comes on suddenly.  · You develop purplish spots or sores (lesions) on your limb.  SEEK IMMEDIATE MEDICAL CARE IF:  · You have a skin rash.  · You have chills or sweats.  · You have shortness of breath.     This information is not intended to replace advice given to you by your health care provider. Make sure you discuss any questions you have with your health care provider.     Document Released: 12/29/2006 Document Revised: 07/18/2014 Document Reviewed: 02/08/2014  Elsevier Interactive Patient Education ©2016 [employer] Inc[employer]

## 2015-08-27 NOTE — Progress Notes (Signed)
Patient: Sabrina Morris Female    DOB: 1966-01-23   50 y.o.   MRN: [id number] Visit Date: 08/27/2015  Today's Provider: Mar Daring, PA-C   Chief Complaint  Patient presents with  . Follow-up    weight   Subjective:    HPI  Obesity:Patient is here to follow up on weight loss. She reports that she being on the Phentermine since March of this year, but started to have side effect from the Phentermine. She is only taking a half tab on M-W-F, she gets rapid heart rate and insomnia and elevated blood pressure when she takes it everyday at full dose. She has not noticed it helping suppress her appetite much but states it does help her edema quite a bit.  Wt Readings from Last 3 Encounters:  08/27/15 209 lb 3.2 oz (94.892 kg)  08/15/15 209 lb 11.2 oz (95.119 kg)  07/18/15 208 lb (94.348 kg)   Current Exercise Habits: Home exercise routine, Type of exercise: walking, Time (Minutes): 45, Frequency (Times/Week): 5, Weekly Exercise (Minutes/Week): 225, Intensity: Moderate    Lymphedema: She is still having quite debilitating lymphedma secondary to 19-20 lymph nodes being removed in 2012 with her right mastectomy secondary to breast cancer. She is having to use her lymph pump for multiple hours daily and also has to spend hours performing the manual drainage massages just to be functional. She also sees a lymphedema therapist once weekly.     Allergies  Allergen Reactions  . Sumatriptan Other (See Comments) and Shortness Of Breath  . Prometrium [Progesterone Micronized] Other (See Comments)    dizziness  . Sulfa Antibiotics   . Tamoxifen Other (See Comments)    Edema, excess fluid  . Zoladex  [Goserelin]   . Hydrocodone-Acetaminophen Rash    Hyperactivity   Current Meds  Medication Sig  . buPROPion (WELLBUTRIN SR) 150 MG 12 hr tablet TAKE ONE TABLET DAILY FOR 3 DAYS, THEN TAKE ONE TABLET TWICE A DAY  . Cholecalciferol (D 5000) 5000 units TABS Take 1 tablet by mouth daily.    . cyclobenzaprine (FLEXERIL) 5 MG tablet Take 1 tablet by mouth as needed.  Marland Kitchen escitalopram (LEXAPRO) 10 MG tablet Take 10 mg by mouth daily.  . fluocinonide (LIDEX) 0.05 % external solution Apply 1 application topically 2 (two) times daily.  . furosemide (LASIX) 20 MG tablet TAKE TWO TABLETS DAILY  . LORazepam (ATIVAN) 1 MG tablet TAKE ONE TABLET TWICE A DAY AS NEEDED  . meloxicam (MOBIC) 7.5 MG tablet Take 1 tablet (7.5 mg total) by mouth 2 (two) times daily. As needed  . oxyCODONE-acetaminophen (PERCOCET/ROXICET) 5-325 MG tablet Take 1-2 tablets by mouth every 8 (eight) hours as needed.   . phentermine (ADIPEX-P) 37.5 MG tablet Take 1 tablet (37.5 mg total) by mouth every morning.  . potassium chloride (K-DUR,KLOR-CON) 10 MEQ tablet Take 1 tablet (10 mEq total) by mouth daily.  . rizatriptan (MAXALT) 10 MG tablet Take 1 tablet (10 mg total) by mouth as needed.  . [DISCONTINUED] phentermine (ADIPEX-P) 37.5 MG tablet TAKE ONE TABLET EVERY MORNING    Review of Systems  Constitutional: Positive for activity change and fatigue. Negative for appetite change and unexpected weight change.  Respiratory: Negative for cough, chest tightness and shortness of breath.   Cardiovascular: Positive for palpitations and leg swelling. Negative for chest pain.  Gastrointestinal: Negative.   Musculoskeletal: Positive for back pain, arthralgias and gait problem.       All secondary  to lymphedema    Social History  Substance Use Topics  . Smoking status: Never Smoker   . Smokeless tobacco: Never Used  . Alcohol Use: No   Objective:   BP 118/70 mmHg  Pulse 88  Temp(Src) 97.6 F (36.4 C) (Oral)  Resp 16  Wt 209 lb 3.2 oz (94.892 kg)  Physical Exam  Constitutional: She appears well-developed and well-nourished. No distress.  Neck: Normal range of motion. Neck supple.  Mild swelling still noted of neck and face but improved compared to March 2017. Improves with daily lymphatic massage.    Cardiovascular: Normal rate, regular rhythm and normal heart sounds.  Exam reveals no gallop and no friction rub.   No murmur heard. Pulmonary/Chest: Effort normal and breath sounds normal. No respiratory distress. She has no wheezes. She has no rales.  Musculoskeletal: She exhibits edema.  Lymphedema sleeves on right UE and bilateral LE  Skin: She is not diaphoretic.  Psychiatric: She has a normal mood and affect. Her behavior is normal. Judgment and thought content normal.  Vitals reviewed.       Assessment & Plan:     1. Menopausal and perimenopausal disorder Will continue phentermine as she has been taking it, 1/2 tab PO M-W-F. She is able to walk approx 2-3 miles now, but now limited due to heat. Discussed water aerobics. She is going to be starting HRT and has a letter from her oncologist for this. She has been seen previously by Dr. Georga Bora and had her pap in February 2017 (normal). She is in search of a new Ob/Gyn for the HRT. She has been seen by one in [place] well as [place]. She will notify us to which one she chooses. She is having her hormone levels checked by the NP [place].  - phentermine (ADIPEX-P) 37.5 MG tablet; Take 1 tablet (37.5 mg total) by mouth every morning.  Dispense: 30 tablet; Refill: 0  2. Acquired lymphedema Debilitating due to time she has to have the full body pump on as well as time she has to perform the self lymphatic massages. She is on long-term disability at this time and I do feel this is still necessary due to the time she has to devote to the lymphedema.       Mar Daring, PA-C  [facility]

## 2015-08-30 ENCOUNTER — Telehealth: Payer: Self-pay | Admitting: *Deleted

## 2015-08-30 ENCOUNTER — Ambulatory Visit: Payer: Managed Care, Other (non HMO)

## 2015-08-30 NOTE — Telephone Encounter (Signed)
Please arrange for port removal (not sure who placed it) Thanks

## 2015-08-30 NOTE — Telephone Encounter (Signed)
TC from patient stating that she is wondering if she should have her port removed as she continues to have facial and neck swelling. Apparently this has been going on for awhile, that Dr. Lindi Adie is aware as is her PCP. She does have lymphedema in her right arm and uses a compression sleeve.  The facial and neck swelling is about the same. She has had the port since 2013 and wonders if it would help to have it removed.  She feels that some of the swelling is in her neck on the left side-same as the port placement. She would like to know what to do at this point. If she needs a chest xray she would prefer to have it at [facility] as it is closer to her home. She states that the port was placed @ [facility].

## 2015-08-31 ENCOUNTER — Telehealth: Payer: Self-pay

## 2015-08-31 NOTE — Telephone Encounter (Signed)
Patient saw you This week and she states she is going to vascular doctor-Dr. Christell Faith at [facility] and she needs notes from last visit and since February that related to the issue of facial swelling that she was seen for, also MRI and [place] or any other tests for this to be faxed to them [phone]

## 2015-08-31 NOTE — Telephone Encounter (Signed)
Advised patient.  Thanks,  -Hanley Woerner

## 2015-08-31 NOTE — Telephone Encounter (Signed)
Carter-please see Dr. Geralyn Flash note. Thanks

## 2015-08-31 NOTE — Telephone Encounter (Signed)
Please let pt know Im on the phone today-aa

## 2015-08-31 NOTE — Telephone Encounter (Signed)
Received notification to arrange for port removal for this pt.  Upon further investigation, PAC was inserted at [facility].  Pt has appointment to follow up with [facility] next week on 09/04/15.  I called pt and left VM to notify her that it is recommended for the facility who placed the line to remove it.  Informed pt she could contact us with any further questions or concerns or to discuss further.

## 2015-08-31 NOTE — Telephone Encounter (Signed)
We can send this info over. I will send to Sabrina Morris and see who this may need to go to to get for patient. Also she may need to also request images from radiology to be on disc as well as the report we can get for her. Most docs want to see the images themselves.

## 2015-09-03 ENCOUNTER — Ambulatory Visit: Payer: Managed Care, Other (non HMO) | Admitting: Hematology and Oncology

## 2015-09-05 ENCOUNTER — Encounter: Payer: Self-pay | Admitting: Family Medicine

## 2015-09-05 ENCOUNTER — Telehealth: Payer: Self-pay | Admitting: Hematology and Oncology

## 2015-09-05 ENCOUNTER — Other Ambulatory Visit: Payer: Self-pay

## 2015-09-05 DIAGNOSIS — I890 Lymphedema, not elsewhere classified: Secondary | ICD-10-CM

## 2015-09-05 NOTE — Telephone Encounter (Signed)
Added Md visit per pof .Marland Kitchen Pt aware per pof

## 2015-09-06 ENCOUNTER — Encounter: Payer: Self-pay | Admitting: Family Medicine

## 2015-09-06 DIAGNOSIS — C50919 Malignant neoplasm of unspecified site of unspecified female breast: Secondary | ICD-10-CM | POA: Insufficient documentation

## 2015-09-06 NOTE — Telephone Encounter (Signed)
Information faxed to Dr Hartford Poli at fax # provided

## 2015-09-07 ENCOUNTER — Encounter: Payer: Self-pay | Admitting: Family Medicine

## 2015-09-10 ENCOUNTER — Telehealth: Payer: Self-pay | Admitting: Family Medicine

## 2015-09-10 ENCOUNTER — Ambulatory Visit: Payer: Managed Care, Other (non HMO) | Admitting: Hematology and Oncology

## 2015-09-10 NOTE — Telephone Encounter (Signed)
Pt stated that she sent a message through My Chart and she wanted an update if we were going to contact insurance to help with prior certification. Please advise. Thanks TNP

## 2015-09-11 ENCOUNTER — Encounter: Payer: Self-pay | Admitting: Hematology and Oncology

## 2015-09-11 ENCOUNTER — Other Ambulatory Visit: Payer: Self-pay | Admitting: *Deleted

## 2015-09-11 ENCOUNTER — Telehealth: Payer: Self-pay

## 2015-09-11 DIAGNOSIS — C50511 Malignant neoplasm of lower-outer quadrant of right female breast: Secondary | ICD-10-CM

## 2015-09-11 NOTE — Telephone Encounter (Signed)
Pt called her insurance company in regards to the denial of her lymphedema therapist need. Reports [employer] informed pt they do not have a record of approval or denial of this case. Pt is requesting a case number on this, so she can call her insurance company with that information. Also reports this may have been done incorrectly if there was not a case number given. [name], CMA

## 2015-09-11 NOTE — Telephone Encounter (Signed)
Called pt; I was unable to get her MLD approved.   Thanks,   -Mickel Baas

## 2015-09-12 ENCOUNTER — Ambulatory Visit: Payer: Managed Care, Other (non HMO) | Admitting: Occupational Therapy

## 2015-09-12 ENCOUNTER — Other Ambulatory Visit: Payer: Self-pay

## 2015-09-14 ENCOUNTER — Encounter: Payer: Self-pay | Admitting: Hematology and Oncology

## 2015-09-14 ENCOUNTER — Other Ambulatory Visit: Payer: Self-pay | Admitting: Family Medicine

## 2015-09-17 ENCOUNTER — Telehealth: Payer: Self-pay | Admitting: Hematology and Oncology

## 2015-09-17 ENCOUNTER — Encounter: Payer: Self-pay | Admitting: Physician Assistant

## 2015-09-17 ENCOUNTER — Ambulatory Visit: Payer: Managed Care, Other (non HMO) | Attending: Family Medicine | Admitting: Occupational Therapy

## 2015-09-17 ENCOUNTER — Other Ambulatory Visit: Payer: Self-pay | Admitting: *Deleted

## 2015-09-17 DIAGNOSIS — I890 Lymphedema, not elsewhere classified: Secondary | ICD-10-CM | POA: Diagnosis not present

## 2015-09-17 DIAGNOSIS — I972 Postmastectomy lymphedema syndrome: Secondary | ICD-10-CM

## 2015-09-17 NOTE — Telephone Encounter (Signed)
spoke w/ pt confirmed lab/flush for 7/6

## 2015-09-17 NOTE — Therapy (Signed)
[facility] [address], [place], [address] Phone: [phone]   Fax:  [phone]  Occupational Therapy Treatment  Patient Details  Name: Sabrina Morris MRN: [id number] Date of Birth: April 18, 1965 Referring Provider: Venia Minks  Encounter Date: 09/17/2015      OT End of Session - 09/17/15 1709    Visit Number 1   Number of Visits 3   Date for OT Re-Evaluation 10/29/15   OT Start Time 1303   OT Stop Time 1500   OT Time Calculation (min) 117 min   Activity Tolerance Patient tolerated treatment well   Behavior During Therapy [facility] for tasks assessed/performed      Past Medical History  Diagnosis Date  . Breast cancer ([place])   . Lymphedema   . H/O colonoscopy     sch 06/12/15  . H/O bone density study 2015  . Pap smear for cervical cancer screening [id number]    Past Surgical History  Procedure Laterality Date  . Mastectomy Right 02/2011  . Cardiac catheterization  1994  . Breast enhancement surgery Bilateral 2005  . Augmentation mammaplasty Left     There were no vitals filed for this visit.      Subjective Assessment - 09/17/15 1656    Subjective  I am on long term disability since Febr - that is when I woke up that one morning with head and neck lymphedema - see Constance Holster for MLD once week since then , also got face and neck flexitouch pump and use the vest and made my thrunk lymphedma worse ,  also got me face compression garment - for the last year I am  sleeping with my daytime compression sleeve and glove - and  not wearing Reidlseeve - and  started walking the last few months - and not wearing my sleeves because my legs are doing better - but arm , trunk and face not doing good    Patient Stated Goals I just want my life back - the fludi taken care of -  that  I can  feel better , be healthier and loose weight    Currently in Pain? 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 - not on disablitiy - trying to get LT Disabiltiy          LYMPHEDEMA/ONCOLOGY QUESTIONNAIRE - 09/17/15 1429    Right Upper Extremity Lymphedema   15 cm Proximal to Olecranon Process 37.3 cm   10 cm Proximal to Olecranon Process 35.8 cm   Olecranon Process 28.3 cm   15 cm Proximal to Ulnar Styloid Process 28.2 cm   10 cm Proximal to Ulnar Styloid Process 24.6 cm   Just Proximal to Ulnar Styloid Process 16.6 cm   Across Hand at [employer] 18.7 cm   At [place] of 2nd Digit 6.5 cm   At [facility] of Thumb 6.5 cm  Left Upper Extremity Lymphedema   15 cm Proximal to Olecranon Process 34 cm   10 cm Proximal to Olecranon Process 31 cm   Olecranon Process 27.2 cm   15 cm Proximal to Ulnar Styloid Process 26.3 cm   10 cm Proximal to Ulnar Styloid Process 22.7 cm   Just Proximal to Ulnar Styloid Process 17.3 cm   Across Hand at [employer] 18.3 cm   At [place] of 2nd Digit 6.6 cm   At [place] of Thumb 7 cm   Right Lower Extremity Lymphedema   20 cm Proximal to Suprapatella 62 cm   10 cm Proximal to Suprapatella 48.2 cm   At Midpatella/Popliteal Crease 38.1 cm   30 cm Proximal to Floor at Lateral Plantar Foot 41 cm   20 cm Proximal to Floor at Lateral Plantar Foot 33._0 cm Proximal to Floor at Lateral Malleoli 23.5 cm   Circumference of ankle/heel 29.3 cm.   5 cm Proximal to 1st MTP Joint 21.4 cm   Across MTP Joint 21.1 cm   Around Proximal Great Toe 8 cm   Left Lower Extremity Lymphedema   20 cm Proximal to Suprapatella 64 cm   10 cm Proximal to Suprapatella 50 cm    At Midpatella/Popliteal Crease 37.7 cm   30 cm Proximal to Floor at Lateral Plantar Foot 39.9 cm   20 cm Proximal to Floor at Lateral Plantar Foot 32.6 cm   10 cm Proximal to Floor at Lateral Malleoli 23 cm   Circumference of ankle/heel 30 cm.   5 cm Proximal to 1st MTP Joint 21.6 cm   Across MTP Joint 21 cm   Around Proximal Great Toe 8.1 cm    Plan - 09/17/15 1710  Breast cancer of lower-outer quadrant of right female breast ([place])  01/01/2011 Initial Diagnosis Patient presented with palpable lump in the right breast a dumbbell-shaped tumor was identified spanning 7 cm  02/24/2011 Surgery Right mastectomy revealed a dumbbell-shaped mass 3.5 cm and 2.5 cm with a total span of 7 cm 1/20 lymph nodes were positive ER/PR positive HER-2 negative  05/07/2011 - 09/04/2011 Chemotherapy Adjuvant chemotherapy with Taxol, Adriamycin, Cytoxan every 3 weeks 6 cycles at [facility] ([email or web address])  09/19/2011 - 10/29/2011 Radiation Therapy Adjuvant radiation therapy 30 fractions including right axilla  12/01/2011 - 04/01/2013 Anti-estrogen oral therapy Adjuvant tamoxifen was started reluctantly, Zoladex was added January 2014 and antiestrogen therapy was discontinued January 2015 (profound lymphedema and anasarca )  Pt was seen several times during the years - for lymphedema in R UE and thorasic , and then later for bilateral LE - it worse during her chemo , then radiation and then with Tamoxifen and after Zoladex shot.  Pt do have Reidsleeve for R UE , Jovipak for use under bra or camisole for L thoracic lymphedema Had several replacements for daytime compression garments - for bilateral LE and R UE - pt was measured by several DME reps - and is hard fit - garments had to be send back a lot of times Pt did worse from start with night time compression on legs that is why she do not have Reidsleeves - but took yrs to get her where is now with LE lymphedema - she do use flexitouch pump for LE  And R UE - but started walking since loosing 12 lbs( on Phentermine) and able per pt to not wear daytime compression sleeves for LE's   Now since Febr 2017 pt has facial  and neck lymphedema - she did send me photo's at that time - but this is the first time I am seeing her for eval - she has been seeing massage therapy thru the years, and since Febr every week for MLD - She also seeing Chiropractor thru the years for low level laser for lymphedema -this is the first time she mention it - and since Febr for neck  And face lymphedema. She also got on her own thru rep head and neck Flexitouch pump - but with the vest the lymphedema in trunk worsen per pt.  She was measured this date and forearm And proximal upper arm bilaterally increase - bilateral LE decrease except - above L knee  She Measure from ear to ear under chin 24 cm; and earlobe to corner of mouth on R 10 cm and L 9.75 cm    PLAN - would recommend for her to sleep elevated , and with her face and neck garment Wear her Reidsleeve for night time on R UE and Jovipak under bra  can get her measured for Belisse bra and get Reidsleeve recalibrated - she did not wear The Reidsleeve for months - started wearing daytime compression sleeve night time - but appear the one she got month ago is to tight and roll at upper arm  Reinforce again how garments should be worn - pt to phone Thursday                      OT Education - 09/17/15 1709    Education provided Yes   Education Details lymphedema , finding and plan    Person(s) Educated Patient   Methods Explanation;Demonstration;Tactile cues;Verbal cues   Comprehension Verbal cues required;Returned demonstration;Verbalized understanding             OT Long Term Goals - 09/17/15 1711    OT LONG TERM GOAL #1   Title Pt to be ind in updated home program and new garments to keep lymphedema under control    Baseline Pt now with head and neck , R Ue , trunk and  bilateral LE lymphedema   Time 6   Status New               Plan - 09/17/15 1710    Rehab Potential Fair   OT Frequency Biweekly   OT Duration 6 weeks   OT Treatment/Interventions Self-care/ADL training;DME and/or AE instruction;Patient/family education;Manual lymph drainage   Plan pt to phone later in week - let me know if want to go ahead with plan - and how she did with some changes in home program for night time    OT Home Exercise Plan see pt instruction   Consulted and Agree with Plan of Care Patient      Patient will benefit from skilled therapeutic intervention in order to improve the following deficits and impairments:  Increased edema, Decreased activity tolerance  Visit Diagnosis: Lymphedema of both lower extremities - Plan: Ot plan of care cert/re-cert  Post-mastectomy lymphedema syndrome - Plan: Ot plan of care cert/re-cert  Lymphedema, not elsewhere classified - Plan: Ot plan of care cert/re-cert    Problem List Patient Active Problem List   Diagnosis Date Noted  . Postmenopausal 07/02/2015  . Lymphedema of face 06/27/2015  . Hypokalemia 11/30/2014  . Breast CA ([place]) 10/03/2014  . Accumulation of fluid in tissues 10/03/2014  . Hypercholesteremia 10/03/2014  . Acquired lymphedema 10/03/2014  . Menopausal and perimenopausal disorder 10/03/2014  .  Borderline diabetes 10/03/2014  . Abnormal weight gain 10/03/2014  . Ventricular pre-excitation with arrhythmia 10/03/2014  . GERD (gastroesophageal reflux disease) 09/22/2014  . Anxiety 09/11/2014  . 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 Lymphedema of leg 01/04/2014  . History of asthma 10/29/2011  . Menopause praecox 09/10/2011  . Post-lymphadenectomy lymphedema of arm 09/10/2011  . Breast cancer of lower-outer quadrant of right female breast ([place]) 01/01/2011  . Infiltrating ductal carcinoma of breast, stage 3 ([place]) 01/01/2011  . Headache, migraine 06/18/2006    Rosalyn Gess  OTR/L,CLT 09/17/2015, 5:46 PM  [facility] [address], [place], [address] Phone: [phone]   Fax:  [phone]  Name: Sabrina Morris MRN: [id number] Date of Birth: [birth year]

## 2015-09-17 NOTE — Patient Instructions (Signed)
Recommend wearing her R UE Reidsleeve   and Jovipak breast pad at night time - under bra or camisole  Elevated head  - wearing head and neck garment And need to get her Reidsleeve recalibrated Measured for Belisse bra   And then if need new Daytime compression garments for R UE and Bilateral LE -get it  done by Rep from [place] - pt had in past long history of garments not fitting after measuring

## 2015-09-19 ENCOUNTER — Other Ambulatory Visit: Payer: Self-pay

## 2015-09-19 ENCOUNTER — Encounter: Payer: Self-pay | Admitting: Surgery

## 2015-09-19 ENCOUNTER — Encounter: Payer: Self-pay | Admitting: Hematology and Oncology

## 2015-09-19 ENCOUNTER — Ambulatory Visit (INDEPENDENT_AMBULATORY_CARE_PROVIDER_SITE_OTHER): Payer: 59 | Admitting: Surgery

## 2015-09-19 VITALS — BP 148/86 | HR 85 | Temp 98.8°F | Ht 66.0 in | Wt 208.0 lb

## 2015-09-19 DIAGNOSIS — N959 Unspecified menopausal and perimenopausal disorder: Secondary | ICD-10-CM

## 2015-09-19 DIAGNOSIS — I890 Lymphedema, not elsewhere classified: Secondary | ICD-10-CM | POA: Diagnosis not present

## 2015-09-19 MED ORDER — CEPHALEXIN 500 MG PO CAPS
500.0000 mg | ORAL_CAPSULE | Freq: Three times a day (TID) | ORAL | Status: DC
Start: 2015-09-19 — End: 2015-10-17

## 2015-09-19 NOTE — Patient Instructions (Signed)
Please see appointment listed below for your port removal. Please call our office if you have any questions or concerns.

## 2015-09-19 NOTE — Progress Notes (Signed)
Patient ID: Sabrina Morris, female   DOB: April 02, 1965, 50 y.o.   MRN: [id number]  History of Present Illness Sabrina Morris is a 50 y.o. female seen in consultation for possible port removal. Of note the patient had a right breast cancer and is status post simple mastectomy with modified axillary dissection. She did receive chemotherapy and her last scan did not show any evidence of active disease. She did develop lymphedema initially on the right side then and in all her body. It is uncertain whether this generalized lymphedema was part of a sequela from that no dissection or from an some capillary leak from chemotherapy. She comes for. She denies any fevers and chills or any other symptoms  Past Medical History Past Medical History  Diagnosis Date  . Breast cancer ([place])   . Lymphedema   . H/O colonoscopy     sch 06/12/15  . H/O bone density study 2015  . Pap smear for cervical cancer screening [id number]     Past Surgical History  Procedure Laterality Date  . Mastectomy Right 02/2011  . Cardiac catheterization  1994  . Breast enhancement surgery Bilateral 2005  . Port a cath injection ([facility])  2013  . Cardiac catheterization  1994    Allergies  Allergen Reactions  . Sumatriptan Other (See Comments) and Shortness Of Breath  . Prometrium [Progesterone Micronized] Other (See Comments)    dizziness  . Sulfa Antibiotics   . Sulfamethoxazole Other (See Comments)  . Tamoxifen Other (See Comments)    Edema, excess fluid  . Zoladex  [Goserelin]   . Hydrocodone-Acetaminophen Rash    Hyperactivity    Current Outpatient Prescriptions  Medication Sig Dispense Refill  . buPROPion (WELLBUTRIN SR) 150 MG 12 hr tablet TAKE ONE TABLET DAILY FOR 3 DAYS, THEN TAKE ONE TABLET TWICE A DAY 60 tablet 5  . Cholecalciferol (D 5000) 5000 units TABS Take 1 tablet by mouth daily.     . cyclobenzaprine (FLEXERIL) 5 MG tablet Take 1 tablet by mouth as needed.    Marland Kitchen escitalopram (LEXAPRO) 10 MG  tablet Take 10 mg by mouth daily.    . fluocinonide (LIDEX) 0.05 % external solution Apply 1 application topically 2 (two) times daily. 60 mL 3  . furosemide (LASIX) 20 MG tablet TAKE TWO TABLETS DAILY 60 tablet 5  . LORazepam (ATIVAN) 1 MG tablet TAKE ONE TABLET TWICE A DAY AS NEEDED 60 tablet 5  . meloxicam (MOBIC) 7.5 MG tablet Take 1 tablet (7.5 mg total) by mouth 2 (two) times daily. As needed 60 tablet 5  . oxyCODONE-acetaminophen (PERCOCET/ROXICET) 5-325 MG tablet Take 1-2 tablets by mouth every 8 (eight) hours as needed.     . phentermine (ADIPEX-P) 37.5 MG tablet Take 1 tablet (37.5 mg total) by mouth every morning. 30 tablet 0  . potassium chloride (K-DUR) 10 MEQ tablet     . potassium chloride (K-DUR,KLOR-CON) 10 MEQ tablet Take 1 tablet (10 mEq total) by mouth daily. 90 tablet 3  . rizatriptan (MAXALT) 10 MG tablet TAKE ONE TABLET AT FIRST SIGN OF MIGRAINE SYMPTOMS--IF NO RELIEF, A SECOND TABLET MAY BE TAKEN IN 2 HOURS LIMIT 2/DAY 5/WEEK 10 tablet 5  . senna-docusate (SENOKOT-S) 8.6-50 MG tablet Take by mouth. Reported on 09/19/2015     No current facility-administered medications for this visit.    Family History Family History  Problem Relation Age of Onset  . Adopted: Yes  . Family history unknown: Yes    Social History Social  History  Substance Use Topics  . Smoking status: Never Smoker   . Smokeless tobacco: Never Used  . Alcohol Use: No    ROS 10 point ROS was negative  Physical Exam Blood pressure 148/86, pulse 85, temperature 98.8 F (37.1 C), temperature source Oral, height 5\' 6"  (1.676 m), weight 94.348 kg (208 lb).  CONSTITUTIONAL: NAD EYES: Pupils equal, round, and reactive to light, Sclera non-icteric. EARS, NOSE, MOUTH AND THROAT: The oropharynx is clear. Oral mucosa is pink and moist. Hearing is intact to voice.  NECK: Trachea is midline, and there is no jugular venous distension. Thyroid is without palpable abnormalities. LYMPH NODES:  Lymph nodes in  the neck are not enlarged. Chest: No chest wall deformities and there is infraclavicular port tracking to the left internal jugular vein. No evidence of infection or complications GI: The abdomen is soft, nontender, and nondistended. There were no palpable masses. There was no hepatosplenomegaly. There were normal bowel sounds. MUSCULOSKELETAL:  Normal muscle strength and tone in all four extremities.    SKIN: Skin turgor is normal. There are no pathologic skin lesions.  NEUROLOGIC:  Motor and sensation is grossly normal.  Cranial nerves are grossly intact. PSYCH:  Alert and oriented to person, place and time. Affect is normal.  Data Reviewed I have personally reviewed the patient's imaging and medical records.    Assessment/Plan Generalized lymphedema unlikely related to the port. Discussed with the patient in detail that there are areas of extremely unlikely that the poor might be contributing to lymphedema however in extreme circumstances 1 R ID that the side of the tubing may be interfering with an adequate drainage of the thoracic duct that can contribute to the lymphedema. I did emphasize that they will be extremely rare and would be spherical possibility. Having said that the patient wants to have the port removed. Scars with the patient in detail about the procedure, risks benefits and possible complications. She is to see her oncologist in a few weeks and we'll make sure that there is no need for further therapy or 4 years use of the port before removing it. Also discussed with her about perioperative antibiotics and she states that she is very prone is susceptible to infections and usually Keflex preoperative works very well. After lengthy discussion we have agreed of a short course of Keflex for 5 days for the perioperative period of the port removal. Extensive counseling provided and I spent at least 35 minutes in this encounter  Caroleen Hamman, MD [place] 09/19/2015, 1:48  PM

## 2015-09-19 NOTE — Progress Notes (Signed)
Pt called requesting lab draw from her port.  She sent list from [place].  Per Dr. Lindi Adie, ok to enter labs and send results to patient's provider, NP Hope Pigeon [phone], fax no [phone].

## 2015-09-20 ENCOUNTER — Other Ambulatory Visit: Payer: Managed Care, Other (non HMO)

## 2015-09-20 ENCOUNTER — Ambulatory Visit (HOSPITAL_BASED_OUTPATIENT_CLINIC_OR_DEPARTMENT_OTHER): Payer: Managed Care, Other (non HMO)

## 2015-09-20 ENCOUNTER — Other Ambulatory Visit (HOSPITAL_BASED_OUTPATIENT_CLINIC_OR_DEPARTMENT_OTHER): Payer: Managed Care, Other (non HMO)

## 2015-09-20 DIAGNOSIS — I890 Lymphedema, not elsewhere classified: Secondary | ICD-10-CM

## 2015-09-20 DIAGNOSIS — N959 Unspecified menopausal and perimenopausal disorder: Secondary | ICD-10-CM | POA: Diagnosis not present

## 2015-09-20 LAB — TSH: TSH: 2.817 m[IU]/L (ref 0.308–3.960)

## 2015-09-20 LAB — MAGNESIUM: MAGNESIUM: 2.3 mg/dL (ref 1.5–2.5)

## 2015-09-20 LAB — FERRITIN: FERRITIN: 60 ng/mL (ref 9–269)

## 2015-09-20 MED ORDER — SODIUM CHLORIDE 0.9 % IJ SOLN
10.0000 mL | INTRAMUSCULAR | Status: DC | PRN
  Administered 2015-09-20: 10 mL via INTRAVENOUS
  Filled 2015-09-20: qty 10

## 2015-09-20 MED ORDER — HEPARIN SOD (PORK) LOCK FLUSH 100 UNIT/ML IV SOLN
500.0000 [IU] | Freq: Once | INTRAVENOUS | Status: AC | PRN
  Administered 2015-09-20: 500 [IU] via INTRAVENOUS
  Filled 2015-09-20: qty 5

## 2015-09-21 ENCOUNTER — Telehealth: Payer: Self-pay

## 2015-09-21 LAB — T3, FREE: T3 FREE: 3.7 pg/mL (ref 2.0–4.4)

## 2015-09-21 LAB — THYROID PEROXIDASE ANTIBODY: THYROID PEROXIDASE ANTIBODY: 8 [IU]/mL (ref 0–34)

## 2015-09-21 LAB — T3: T3 TOTAL: 154 ng/dL (ref 71–180)

## 2015-09-21 LAB — SELENIUM SERUM: Selenium, S/P: 113 ug/L (ref 79–326)

## 2015-09-21 LAB — ESTRADIOL: Estradiol: <5 pg/mL

## 2015-09-21 LAB — PROGESTERONE: Progesterone: 0.1 ng/mL

## 2015-09-21 LAB — LITHIUM LEVEL: Lithium: <0.1 mmol/L — ABNORMAL LOW (ref 0.6–1.2)

## 2015-09-21 LAB — THYROGLOBULIN ANTIBODY

## 2015-09-21 LAB — T4, FREE: FREE T4: 1.16 ng/dL (ref 0.82–1.77)

## 2015-09-21 LAB — T4: Thyroxine (T4): 7.3 ug/dL (ref 4.5–12.0)

## 2015-09-21 NOTE — Telephone Encounter (Signed)
Attempted to fax lab results from 09/20/15 to number provided from [place], [place].  Fax failed and thus call was made to phone number provided for Hope Pigeon, NP.  Number provided sent me to facility who stated this NP was no longer associated with this practice.  This RN then called the patient to find out which number to send lab results to.  Pt gave new fax # of [phone].  Lab results sent to this fax number as requested and pt notified to let us know should she need these sent somewhere else.

## 2015-09-23 LAB — SEROTONIN SERUM: SEROTONIN, SERUM: 10 ng/mL (ref 0–420)

## 2015-09-24 LAB — T3, REVERSE: Reverse T3, Serum: 19.2 ng/dL (ref 9.2–24.1)

## 2015-09-25 ENCOUNTER — Encounter: Payer: Self-pay | Admitting: Hematology and Oncology

## 2015-09-26 ENCOUNTER — Encounter: Payer: Self-pay | Admitting: Physician Assistant

## 2015-09-28 ENCOUNTER — Telehealth: Payer: Self-pay | Admitting: Hematology and Oncology

## 2015-09-28 ENCOUNTER — Encounter: Payer: Self-pay | Admitting: Hematology and Oncology

## 2015-09-28 ENCOUNTER — Ambulatory Visit: Payer: Managed Care, Other (non HMO) | Admitting: Hematology and Oncology

## 2015-09-28 NOTE — Telephone Encounter (Signed)
cxl 7/14 appt per pt not feeling well. will call back to r/s

## 2015-09-28 NOTE — Assessment & Plan Note (Signed)
Right breast cancer T3 N1 M0 stage IIIa invasive ductal carcinoma ER/PR positive HER-2 negative status post mastectomy December 2012, followed by 6 cycles of TAC completed June 2013 followed by radiation July 2013, started tamoxifen September 2013, started Zoladex January 2014, discontinued all antiestrogen therapy January 2015  Tamoxifen toxicities: Profound anasarca and severe weight gain from fluid buildup Lymphedema management: Patient undergoes manual lymphedema drainage procedures every day as well as sleep and lymphedema pumps to help with her lymphedema issues. Current treatment of swelling: Lasix 20 by mouth twice a day, phentermine, Adipex  Menopausal symptoms: Patient is currently on estrogen replacement therapy for the menopausal symptoms. She is fully aware that she is risking breast cancer relapse by taking estrogen replacement therapy.  Breast Cancer Surveillance: Mammogram 08/14/2015: Benign, breast density category B, Left retropectoral implant   Return to clinic in 6 months for follow-up

## 2015-10-02 ENCOUNTER — Encounter: Payer: Self-pay | Admitting: Hematology and Oncology

## 2015-10-03 ENCOUNTER — Encounter: Payer: Self-pay | Admitting: Physician Assistant

## 2015-10-05 NOTE — Progress Notes (Signed)
Referral information received from [facility].  Information filled out and signed as requested.  Referral information faxed to [facility] per pt request.

## 2015-10-16 ENCOUNTER — Telehealth: Payer: Self-pay | Admitting: Hematology and Oncology

## 2015-10-16 ENCOUNTER — Ambulatory Visit (HOSPITAL_BASED_OUTPATIENT_CLINIC_OR_DEPARTMENT_OTHER): Payer: Managed Care, Other (non HMO)

## 2015-10-16 ENCOUNTER — Encounter: Payer: Self-pay | Admitting: Hematology and Oncology

## 2015-10-16 ENCOUNTER — Ambulatory Visit (HOSPITAL_BASED_OUTPATIENT_CLINIC_OR_DEPARTMENT_OTHER): Payer: Managed Care, Other (non HMO) | Admitting: Hematology and Oncology

## 2015-10-16 DIAGNOSIS — C50511 Malignant neoplasm of lower-outer quadrant of right female breast: Secondary | ICD-10-CM

## 2015-10-16 DIAGNOSIS — Z853 Personal history of malignant neoplasm of breast: Secondary | ICD-10-CM

## 2015-10-16 DIAGNOSIS — I890 Lymphedema, not elsewhere classified: Secondary | ICD-10-CM

## 2015-10-16 DIAGNOSIS — N951 Menopausal and female climacteric states: Secondary | ICD-10-CM

## 2015-10-16 DIAGNOSIS — R221 Localized swelling, mass and lump, neck: Secondary | ICD-10-CM

## 2015-10-16 LAB — COMPREHENSIVE METABOLIC PANEL
ALT: 23 U/L (ref 0–55)
AST: 19 U/L (ref 5–34)
Albumin: 4.2 g/dL (ref 3.5–5.0)
Alkaline Phosphatase: 105 U/L (ref 40–150)
Anion Gap: 9 mEq/L (ref 3–11)
BILIRUBIN TOTAL: 0.61 mg/dL (ref 0.20–1.20)
BUN: 14.6 mg/dL (ref 7.0–26.0)
CO2: 25 meq/L (ref 22–29)
CREATININE: 0.9 mg/dL (ref 0.6–1.1)
Calcium: 9.5 mg/dL (ref 8.4–10.4)
Chloride: 104 mEq/L (ref 98–109)
EGFR: 72 mL/min/{1.73_m2} — AB (ref >90)
GLUCOSE: 86 mg/dL (ref 70–140)
Potassium: 4.1 mEq/L (ref 3.5–5.1)
SODIUM: 138 meq/L (ref 136–145)
TOTAL PROTEIN: 7.4 g/dL (ref 6.4–8.3)

## 2015-10-16 LAB — CBC WITH DIFFERENTIAL/PLATELET
BASO%: 0.8 % (ref 0.0–2.0)
Basophils Absolute: 0 10*3/uL (ref 0.0–0.1)
EOS%: 2 % (ref 0.0–7.0)
Eosinophils Absolute: 0.1 10*3/uL (ref 0.0–0.5)
HCT: 45.8 % (ref 34.8–46.6)
HGB: 14.7 g/dL (ref 11.6–15.9)
LYMPH%: 18.4 % (ref 14.0–49.7)
MCH: 29.2 pg (ref 25.1–34.0)
MCHC: 32.1 g/dL (ref 31.5–36.0)
MCV: 90.9 fL (ref 79.5–101.0)
MONO#: 0.5 10*3/uL (ref 0.1–0.9)
MONO%: 8.5 % (ref 0.0–14.0)
NEUT%: 70.3 % (ref 38.4–76.8)
NEUTROS ABS: 4 10*3/uL (ref 1.5–6.5)
Platelets: 203 10*3/uL (ref 145–400)
RBC: 5.04 10*6/uL (ref 3.70–5.45)
RDW: 13.5 % (ref 11.2–14.5)
WBC: 5.7 10*3/uL (ref 3.9–10.3)
lymph#: 1 10*3/uL (ref 0.9–3.3)

## 2015-10-16 NOTE — Telephone Encounter (Signed)
appt made and avs printed. Central radiology to schedule Ct chest/neck/ab/pelvis. Pt to return to lab today per [facility] orders

## 2015-10-16 NOTE — Progress Notes (Signed)
Patient Care Team: Mar Daring, PA-C as PCP - General (Family Medicine)   SUMMARY OF ONCOLOGIC HISTORY:   Breast cancer of lower-outer quadrant of right female breast ([place])   01/01/2011 Initial Diagnosis    Patient presented with palpable lump in the right breast a dumbbell-shaped tumor was identified spanning 7 cm     02/24/2011 Surgery    Right mastectomy revealed a dumbbell-shaped mass 3.5 cm and 2.5 cm with a total span of 7 cm 1/20 lymph nodes were positive ER/PR positive HER-2 negative     05/07/2011 - 09/04/2011 Chemotherapy    Adjuvant chemotherapy with Taxol, Adriamycin, Cytoxan every 3 weeks 6 cycles at [facility] ([email or web address])     09/19/2011 - 10/29/2011 Radiation Therapy    Adjuvant radiation therapy 30 fractions including right axilla     12/01/2011 - 04/01/2013 Anti-estrogen oral therapy    Adjuvant tamoxifen was started reluctantly, Zoladex was added January 2014 and antiestrogen therapy was discontinued January 2015 (profound lymphedema and anasarca )      CHIEF COMPLIANT: Complaining of neck and chest wall swelling  INTERVAL HISTORY: Sabrina Morris is a 50 year old with above-mentioned history of right breast cancer treated with mastectomy followed by adjuvant chemotherapy and radiation. She took tamoxifen for a year and half and discontinued it because of lymphedema and anasarca issues. She has been trying to find a provider who can prescribe her estrogen replacement therapy. She is here today complaining of swelling in the neck which she felt was related to lymphadenopathy of lymphedema. Her lymphedema specialist at [facility] suggested that she may need CT scans for further evaluation. Patient is very anxious to get a CT scan before she removes the port.  REVIEW OF SYSTEMS:   Constitutional: Denies fevers, chills or abnormal weight loss Eyes: Denies blurriness of vision Ears, nose, mouth, throat, and face: Denies mucositis or sore throat Respiratory:  Denies cough, dyspnea or wheezes Cardiovascular: Denies palpitation, chest discomfort Gastrointestinal:  Denies nausea, heartburn or change in bowel habits Skin: Denies abnormal skin rashes Lymphatics: Face neck chest wall and abdominal wall swelling from lymphedema Neurological:Denies numbness, tingling or new weaknesses Behavioral/Psych: Mood is stable, no new changes  Extremities: No lower extremity edema All other systems were reviewed with the patient and are negative.  I have reviewed the past medical history, past surgical history, social history and family history with the patient and they are unchanged from previous note.  ALLERGIES:  is allergic to sumatriptan; prometrium [progesterone micronized]; sulfa antibiotics; sulfamethoxazole; tamoxifen; zoladex  [goserelin]; and hydrocodone-acetaminophen.  MEDICATIONS:  Current Outpatient Prescriptions  Medication Sig Dispense Refill  . buPROPion (WELLBUTRIN SR) 150 MG 12 hr tablet TAKE ONE TABLET DAILY FOR 3 DAYS, THEN TAKE ONE TABLET TWICE A DAY 60 tablet 5  . cephALEXin (KEFLEX) 500 MG capsule Take 1 capsule (500 mg total) by mouth 3 (three) times daily. 15 capsule 0  . Cholecalciferol (D 5000) 5000 units TABS Take 1 tablet by mouth daily.     . cyclobenzaprine (FLEXERIL) 5 MG tablet Take 1 tablet by mouth as needed.    Marland Kitchen escitalopram (LEXAPRO) 10 MG tablet Take 10 mg by mouth daily.    . fluocinonide (LIDEX) 0.05 % external solution Apply 1 application topically 2 (two) times daily. 60 mL 3  . furosemide (LASIX) 20 MG tablet TAKE TWO TABLETS DAILY 60 tablet 5  . LORazepam (ATIVAN) 1 MG tablet TAKE ONE TABLET TWICE A DAY AS NEEDED 60 tablet 5  . meloxicam (MOBIC) 7.5  MG tablet Take 1 tablet (7.5 mg total) by mouth 2 (two) times daily. As needed 60 tablet 5  . oxyCODONE-acetaminophen (PERCOCET/ROXICET) 5-325 MG tablet Take 1-2 tablets by mouth every 8 (eight) hours as needed.     . phentermine (ADIPEX-P) 37.5 MG tablet Take 1 tablet  (37.5 mg total) by mouth every morning. 30 tablet 0  . potassium chloride (K-DUR) 10 MEQ tablet     . potassium chloride (K-DUR,KLOR-CON) 10 MEQ tablet Take 1 tablet (10 mEq total) by mouth daily. 90 tablet 3  . rizatriptan (MAXALT) 10 MG tablet TAKE ONE TABLET AT FIRST SIGN OF MIGRAINE SYMPTOMS--IF NO RELIEF, A SECOND TABLET MAY BE TAKEN IN 2 HOURS LIMIT 2/DAY 5/WEEK 10 tablet 5  . senna-docusate (SENOKOT-S) 8.6-50 MG tablet Take by mouth. Reported on 09/19/2015     No current facility-administered medications for this visit.     PHYSICAL EXAMINATION: ECOG PERFORMANCE STATUS: 1 - Symptomatic but completely ambulatory  Vitals:   10/16/15 1508  BP: 131/63  Pulse: (!) 109  Resp: 18  Temp: 97.6 F (36.4 C)   Filed Weights   10/16/15 1508  Weight: 206 lb 3.2 oz (93.5 kg)    GENERAL:alert, no distress and comfortable SKIN: skin color, texture, turgor are normal, no rashes or significant lesions EYES: normal, Conjunctiva are pink and non-injected, sclera clear OROPHARYNX:no exudate, no erythema and lips, buccal mucosa, and tongue normal  NECK: supple, thyroid normal size, non-tender, without nodularity LYMPH:  Swelling of the face neck chest wall and axilla bilaterally LUNGS: clear to auscultation and percussion with normal breathing effort HEART: regular rate & rhythm and no murmurs and no lower extremity edema ABDOMEN:abdomen soft, non-tender and normal bowel sounds MUSCULOSKELETAL:no cyanosis of digits and no clubbing  NEURO: alert & oriented x 3 with fluent speech, no focal motor/sensory deficits EXTREMITIES: No lower extremity edema  LABORATORY DATA:  I have reviewed the data as listed   Chemistry      Component Value Date/Time   NA 139 04/20/2015 1208   NA 140 06/22/2013 1023   K 4.5 04/20/2015 1208   K 4.0 06/22/2013 1023   CL 101 04/20/2015 1208   CL 102 06/22/2013 1023   CO2 22 04/20/2015 1208   CO2 31 06/22/2013 1023   BUN 17 04/20/2015 1208   BUN 19 (H)  06/22/2013 1023   CREATININE 0.98 04/20/2015 1208   CREATININE 0.86 06/22/2013 1023   GLU 92 06/16/2014      Component Value Date/Time   CALCIUM 9.6 04/20/2015 1208   CALCIUM 9.4 06/22/2013 1023   ALKPHOS 99 04/20/2015 1208   ALKPHOS 104 03/14/2013 1403   AST 20 04/20/2015 1208   AST 22 03/14/2013 1403   ALT 19 04/20/2015 1208   ALT 46 03/14/2013 1403   BILITOT 0.4 04/20/2015 1208   BILITOT 0.2 03/14/2013 1403       Lab Results  Component Value Date   WBC 6.7 04/20/2015   HGB 14.0 03/14/2013   HCT 43.7 04/20/2015   MCV 91 04/20/2015   PLT 256 04/20/2015   NEUTROABS 4.4 04/20/2015     ASSESSMENT & PLAN:  Breast cancer of lower-outer quadrant of right female breast ([place]) Right breast cancer T3 N1 M0 stage IIIa invasive ductal carcinoma ER/PR positive HER-2 negative status post mastectomy December 2012, followed by 6 cycles of TAC completed June 2013 followed by radiation July 2013, started tamoxifen September 2013, started Zoladex January 2014, discontinued all antiestrogen therapy January 2015  Tamoxifen toxicities: Profound  anasarca and severe weight gain from fluid buildup Lymphedema management: Patient undergoes manual lymphedema drainage procedures every day as well as sleep and lymphedema pumps to help with her lymphedema issues. Current treatment of swelling: Lasix 20 by mouth twice a day, phentermine, Adipex  Menopausal symptoms: Patient is currently receiving estrogen replacement therapy for the menopausal symptoms. She is fully aware that she is risking breast cancer relapse by taking estrogen replacement therapy.  Breast Cancer Surveillance: 1. Breast exam 10/16/2015: Right mastectomy, no palpable abnormalities, lymphedema 2. Mammogram left breast: 08/14/2015: No findings suspicious for malignancy. Breast density category B  Neck and face swelling: I'm worried about SVC syndrome. I would like to obtain CT neck chest abdomen pelvis for further evaluation and  restaging. I will call the patient with the result of the scans.  Return to clinic in 6 months for surveillance and follow-up.    Orders Placed This Encounter  Procedures  . CT Abdomen Pelvis W Contrast    Standing Status:   Future    Standing Expiration Date:   10/15/2016    Order Specific Question:   If indicated for the ordered procedure, I authorize the administration of contrast media per Radiology protocol    Answer:   Yes    Order Specific Question:   Reason for Exam (SYMPTOM  OR DIAGNOSIS REQUIRED)    Answer:   Abdominal pain with breast cancer    Order Specific Question:   Is the patient pregnant?    Answer:   No    Order Specific Question:   Preferred imaging location?    Answer:   [facility]  . CT Chest W Contrast    Standing Status:   Future    Standing Expiration Date:   10/15/2016    Order Specific Question:   If indicated for the ordered procedure, I authorize the administration of contrast media per Radiology protocol    Answer:   Yes    Order Specific Question:   Reason for Exam (SYMPTOM  OR DIAGNOSIS REQUIRED)    Answer:   Chest tightness    Order Specific Question:   Is the patient pregnant?    Answer:   No    Order Specific Question:   Preferred imaging location?    Answer:   [facility]  . CT Soft Tissue Neck W Contrast    Standing Status:   Future    Standing Expiration Date:   10/15/2016    Order Specific Question:   If indicated for the ordered procedure, I authorize the administration of contrast media per Radiology protocol    Answer:   Yes    Order Specific Question:   Reason for Exam (SYMPTOM  OR DIAGNOSIS REQUIRED)    Answer:   Neck swelling with breast cancer    Order Specific Question:   Is the patient pregnant?    Answer:   No    Order Specific Question:   Preferred imaging location?    Answer:   [facility]  . CBC with Differential    Standing Status:   Future    Standing Expiration Date:   10/15/2016  . Comprehensive  metabolic panel    Standing Status:   Future    Standing Expiration Date:   10/15/2016   The patient has a good understanding of the overall plan. she agrees with it. she will call with any problems that may develop before the next visit here.   Sabas Sous, MD  10/16/15    

## 2015-10-16 NOTE — Assessment & Plan Note (Signed)
Right breast cancer T3 N1 M0 stage IIIa invasive ductal carcinoma ER/PR positive HER-2 negative status post mastectomy December 2012, followed by 6 cycles of TAC completed June 2013 followed by radiation July 2013, started tamoxifen September 2013, started Zoladex January 2014, discontinued all antiestrogen therapy January 2015  Tamoxifen toxicities: Profound anasarca and severe weight gain from fluid buildup Lymphedema management: Patient undergoes manual lymphedema drainage procedures every day as well as sleep and lymphedema pumps to help with her lymphedema issues. Current treatment of swelling: Lasix 20 by mouth twice a day, phentermine, Adipex  Menopausal symptoms: Patient is currently receiving estrogen replacement therapy for the menopausal symptoms. She is fully aware that she is risking breast cancer relapse by taking estrogen replacement therapy.  Breast Cancer Surveillance: 1. Breast exam 10/16/2015: Right mastectomy, no palpable abnormalities, lymphedema 2. Mammogram left breast: 08/14/2015: No findings suspicious for malignancy. Breast density category B  Return to clinic in 6 months for surveillance and follow-up.

## 2015-10-17 ENCOUNTER — Telehealth: Payer: Self-pay | Admitting: Physician Assistant

## 2015-10-17 ENCOUNTER — Ambulatory Visit: Payer: Managed Care, Other (non HMO)

## 2015-10-17 DIAGNOSIS — F419 Anxiety disorder, unspecified: Secondary | ICD-10-CM

## 2015-10-17 DIAGNOSIS — L03119 Cellulitis of unspecified part of limb: Secondary | ICD-10-CM

## 2015-10-17 MED ORDER — CEPHALEXIN 500 MG PO CAPS: 500.0000 mg | ORAL_CAPSULE | Freq: Three times a day (TID) | ORAL | 0 refills | Status: DC

## 2015-10-17 NOTE — Telephone Encounter (Signed)
Please review.  Thanks,  -Harrietta Incorvaia 

## 2015-10-17 NOTE — Telephone Encounter (Signed)
LM stating medication was sent to [facility].  Thanks,  -Joseline

## 2015-10-17 NOTE — Telephone Encounter (Signed)
Pt says Dr. Venia Minks treats her for  lymphedema on her arms and hands.  She says Dr. Venia Minks prescribes Keflex 500 mg, tid for 7 to 10 days .  She has a flair up and wants a refill.  I suggested she may need to see you but she did not want to make an appt,  She uses [facility].  Her call back is [phone]  Thanks teri

## 2015-10-17 NOTE — Telephone Encounter (Signed)
Sent in 10 days supply to [facility]

## 2015-10-18 ENCOUNTER — Ambulatory Visit: Payer: Managed Care, Other (non HMO) | Admitting: Occupational Therapy

## 2015-10-18 ENCOUNTER — Ambulatory Visit: Payer: Managed Care, Other (non HMO) | Attending: Family Medicine | Admitting: Occupational Therapy

## 2015-10-18 ENCOUNTER — Encounter: Payer: Self-pay | Admitting: Hematology and Oncology

## 2015-10-18 DIAGNOSIS — I890 Lymphedema, not elsewhere classified: Secondary | ICD-10-CM | POA: Diagnosis not present

## 2015-10-18 DIAGNOSIS — I972 Postmastectomy lymphedema syndrome: Secondary | ICD-10-CM | POA: Insufficient documentation

## 2015-10-18 NOTE — Therapy (Signed)
[facility] [address] [place], [address] Phone: [phone]   Fax:  [phone]  Occupational Therapy Treatment  Patient Details  Name: Sabrina Morris MRN: [id number] Date of Birth: [birth year] Referring Provider: Venia Minks  Encounter Date: 10/18/2015      OT End of Session - 10/18/15 2100    Visit Number 2   Number of Visits 3   Date for OT Re-Evaluation 10/29/15   OT Start Time 1200   OT Stop Time 1225   OT Time Calculation (min) 25 min   Activity Tolerance Patient tolerated treatment well   Behavior During Therapy [facility] for tasks assessed/performed      Past Medical History:  Diagnosis Date  . Breast cancer ([place])   . H/O bone density study 2015  . H/O colonoscopy    sch 06/12/15  . Lymphedema   . Pap smear for cervical cancer screening [id number]    Past Surgical History:  Procedure Laterality Date  . BREAST ENHANCEMENT SURGERY Bilateral 2005  . CARDIAC CATHETERIZATION  1994  . CARDIAC CATHETERIZATION  1994  . MASTECTOMY Right 02/2011  . PORT A CATH INJECTION ([place] [facility])  2013    There were no vitals filed for this visit.      Subjective Assessment - 10/18/15 2056    Subjective  I has seen the lymphedema clinic in [place] and [facility] since I seen you - also saying to replace garments - but maybe more edema in LE and body - inflammation in body - I want new daytime garment for R arm and glove - as well as Reidsleeve, daytime garments for bilateral LE- Belisse bra and trunk compression - they told me to hold off on head/neck  compression   - I have appt with NP in [place] in few wks for intergrative medicine - did not start hormone treatment - and port coming out in few days    Patient Stated Goals I just want my life back - the fludi taken care of -  that  I can  feel better , be healthier and loose weight    Currently in Pain? No/denies     Consulted with pt and reps what garments is   needed - reps recommended Capri and knee highs  But discuss compliance  Factor - pt prefer thigh highs , and then daytime compression for R arm with  glove  Pt wants Reidsleeve for R UE - and then Belisse bra needed - for trunk she can get something at sport store that is cheaper with velcro fastener   Will reassess pt garments when arriving                               OT Long Term Goals - 10/18/15 2108      OT LONG TERM GOAL #1   Title Pt to be ind in updated home program and new garments to keep lymphedema under control    Baseline measured this date    Time 4   Period Weeks   Status On-going               Plan - 10/18/15 2101    Clinical Impression Statement Pt meeting this date fitters from [place] and [employer] rep to get measured for daytime garments and glove for R UE , bilateral LE garments - Elvarex  - Belisse bra - and pt to get  trunk compression maybe at sport store that is cheaper -they did measure her for new Reidsleeve for R UE - pt advice pt to hold off on LE Reidsleeve because she is walking and sleeping with no compression and measurements compare great  with no increase the last time -  pt persuing different options  for lymphedema and edema    Rehab Potential Fair   OT Frequency Biweekly   OT Duration 4 weeks   OT Treatment/Interventions Self-care/ADL training;DME and/or AE instruction;Patient/family education;Manual lymph drainage   Plan await pt to receive garments  - assess ift    OT Home Exercise Plan see pt instruction   Consulted and Agree with Plan of Care Patient      Patient will benefit from skilled therapeutic intervention in order to improve the following deficits and impairments:  Increased edema, Decreased activity tolerance  Visit Diagnosis: Lymphedema of both lower extremities  Post-mastectomy lymphedema syndrome  Lymphedema, not elsewhere classified    Problem List Patient Active Problem List   Diagnosis  Date Noted  . Postmenopausal 07/02/2015  . Lymphedema of face 06/27/2015  . Hypokalemia 11/30/2014  . Accumulation of fluid in tissues 10/03/2014  . Hypercholesteremia 10/03/2014  . Acquired lymphedema 10/03/2014  . Menopausal and perimenopausal disorder 10/03/2014  . Borderline diabetes 10/03/2014  . Abnormal weight gain 10/03/2014  . Ventricular pre-excitation with arrhythmia 10/03/2014  . GERD (gastroesophageal reflux disease) 09/22/2014  . Anxiety 09/11/2014  . 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 Lymphedema of leg 01/04/2014  . History of asthma 10/29/2011  . Menopause praecox 09/10/2011  . Post-lymphadenectomy lymphedema of arm 09/10/2011  . Breast cancer of lower-outer quadrant of right female breast ([place]) 01/01/2011  . Headache, migraine 06/18/2006    Rosalyn Gess OTR/L,CLT  10/18/2015, 9:09 PM  [facility] [address], [place], [address] Phone: [phone]   Fax:  [phone]  Name: Sabrina Morris MRN: [id number] Date of Birth: 03/19/1965

## 2015-10-22 ENCOUNTER — Ambulatory Visit (INDEPENDENT_AMBULATORY_CARE_PROVIDER_SITE_OTHER): Payer: Managed Care, Other (non HMO) | Admitting: Surgery

## 2015-10-22 DIAGNOSIS — C50511 Malignant neoplasm of lower-outer quadrant of right female breast: Secondary | ICD-10-CM

## 2015-10-22 NOTE — Progress Notes (Signed)
Brief op Note   Preop Dx: Lymphedema and breast CA Post op Dx : same  Procedures:1.   Removal of port                      2.  Excision of heterotopic ossification 2 cms in size anterior chest wall  Anesthesia: lidocaine 1% w epi  Complications: none  Findings: heterotopic calcification around port cuff and catheter  She was explained about the respective procedure , risks , benefits and  possible complications, a written consent was obtained. She was placed in the supine position and she was prepped and draped in the usual sterile fashion. Anesthetic was injected into the skin and subcutaneous tissue. Using a 15 blade knife incision was created and Metzenbaum scissors were used to dissect his impotence tissue. The port capsule was identified as well as the cuff. The capsule was incised , entered and was excised as well. There was significant calcification around the cuff of the catheter that we needed to remove. Because the patient complains of discomfort I also decided to remove all the heterotopicossifications that measure approximately 2 cm and there were located on the anterior chest wall. This was done using Metzenbaum scissors. Hemostasis was obtained with pressure. The catheter was removed and pressure was applied to the left IJ for a few minutes. There was no evidence of any bleeding. The wound was irrigated and closed in a 2 layer fashion with 3-0 Vicryl for the dermis and 4-0 Monocryl for the skin. Dermabond was used. A laparotomy counts were correct and there were no complications

## 2015-10-22 NOTE — Patient Instructions (Signed)
You may take a shower on Wednesday. You may wash the area with soap and water, however, do not place any creams, lotions, or ointments to this area.  The sutures will dissolve over the next several weeks.  I will call you with the pathology as soon as it is available.   Continue the antibiotic that you have been given but if you develop any problems related to this, stop this medication immediately and call the office.  Please call our office with any questions or concerns.

## 2015-10-23 ENCOUNTER — Encounter: Payer: Self-pay | Admitting: Surgery

## 2015-10-24 ENCOUNTER — Telehealth: Payer: Self-pay

## 2015-10-24 NOTE — Telephone Encounter (Signed)
Call was made to patient at this time to give her the pathology results from her recent port removal. No answer. Left voicemail requesting patient call back for results.

## 2015-10-24 NOTE — Telephone Encounter (Signed)
Patient returned phone call. Results were given. All questions were answered.   Patient states that she is feeling much better, still having some soreness but localized edema has already began to recede.   Patient asked to call if she has any questions or concerns at all with her continued lymphedema. She verbalizes understanding of this.

## 2015-10-24 NOTE — Telephone Encounter (Signed)
-----   Message from Jules Husbands, MD sent at 10/23/2015  4:31 PM EDT ----- Please let the pt know about the benign results of her path ----- Message ----- From: Maudie Flakes Sent: 10/23/2015   3:28 PM To: Jules Husbands, MD

## 2015-10-31 ENCOUNTER — Other Ambulatory Visit: Payer: Self-pay

## 2015-10-31 DIAGNOSIS — R221 Localized swelling, mass and lump, neck: Secondary | ICD-10-CM

## 2015-10-31 DIAGNOSIS — C50511 Malignant neoplasm of lower-outer quadrant of right female breast: Secondary | ICD-10-CM

## 2015-10-31 DIAGNOSIS — R220 Localized swelling, mass and lump, head: Secondary | ICD-10-CM

## 2015-11-01 ENCOUNTER — Ambulatory Visit (HOSPITAL_COMMUNITY)
Admission: RE | Admit: 2015-11-01 | Discharge: 2015-11-01 | Disposition: A | Discharge status: Home / Self Care | Payer: Managed Care, Other (non HMO) | Source: Ambulatory Visit | Attending: Hematology and Oncology | Admitting: Hematology and Oncology

## 2015-11-01 ENCOUNTER — Encounter: Payer: Self-pay | Admitting: Hematology and Oncology

## 2015-11-01 ENCOUNTER — Ambulatory Visit (HOSPITAL_COMMUNITY): Payer: Managed Care, Other (non HMO)

## 2015-11-01 DIAGNOSIS — C50511 Malignant neoplasm of lower-outer quadrant of right female breast: Secondary | ICD-10-CM | POA: Diagnosis not present

## 2015-11-01 DIAGNOSIS — Z9011 Acquired absence of right breast and nipple: Secondary | ICD-10-CM | POA: Diagnosis not present

## 2015-11-01 DIAGNOSIS — R221 Localized swelling, mass and lump, neck: Secondary | ICD-10-CM

## 2015-11-01 DIAGNOSIS — Z923 Personal history of irradiation: Secondary | ICD-10-CM | POA: Insufficient documentation

## 2015-11-01 DIAGNOSIS — R220 Localized swelling, mass and lump, head: Secondary | ICD-10-CM

## 2015-11-01 MED ORDER — IOPAMIDOL (ISOVUE-300) INJECTION 61%
100.0000 mL | Freq: Once | INTRAVENOUS | Status: AC | PRN
  Administered 2015-11-01: 100 mL via INTRAVENOUS

## 2015-11-02 ENCOUNTER — Encounter: Payer: Self-pay | Admitting: Hematology and Oncology

## 2015-11-06 ENCOUNTER — Encounter: Payer: Self-pay | Admitting: Hematology and Oncology

## 2015-11-06 ENCOUNTER — Telehealth: Payer: Self-pay | Admitting: Physician Assistant

## 2015-11-06 ENCOUNTER — Encounter: Payer: Self-pay | Admitting: Physician Assistant

## 2015-11-06 DIAGNOSIS — E278 Other specified disorders of adrenal gland: Secondary | ICD-10-CM

## 2015-11-06 NOTE — Telephone Encounter (Signed)
Referral has been placed and patient notified through [facility]

## 2015-11-06 NOTE — Telephone Encounter (Signed)
Pt called to make sure we received the MyChart message. See would like to see Dr Carmela Rima, Endocrinologist asap and needs a referral. Please advise. Thanks TNP

## 2015-11-12 ENCOUNTER — Encounter: Payer: Self-pay | Admitting: Physician Assistant

## 2015-11-22 ENCOUNTER — Encounter: Payer: Self-pay | Admitting: Hematology and Oncology

## 2015-11-26 ENCOUNTER — Encounter: Payer: Self-pay | Admitting: Physician Assistant

## 2015-11-26 DIAGNOSIS — M62838 Other muscle spasm: Secondary | ICD-10-CM

## 2015-11-26 MED ORDER — METAXALONE 800 MG PO TABS: 400.0000 mg | ORAL_TABLET | Freq: Three times a day (TID) | ORAL | 3 refills | Status: DC | PRN

## 2015-12-06 ENCOUNTER — Other Ambulatory Visit: Payer: Self-pay | Admitting: Family Medicine

## 2015-12-06 DIAGNOSIS — F419 Anxiety disorder, unspecified: Secondary | ICD-10-CM

## 2015-12-07 NOTE — Telephone Encounter (Signed)
We have her being on lexapro 10mg  not 20mg . Can we verify?

## 2015-12-10 ENCOUNTER — Encounter: Payer: Self-pay | Admitting: Hematology and Oncology

## 2015-12-10 ENCOUNTER — Encounter: Payer: Self-pay | Admitting: Physician Assistant

## 2015-12-10 ENCOUNTER — Encounter: Payer: Self-pay | Admitting: Occupational Therapy

## 2015-12-10 DIAGNOSIS — G43109 Migraine with aura, not intractable, without status migrainosus: Secondary | ICD-10-CM

## 2015-12-10 MED ORDER — RIZATRIPTAN BENZOATE 10 MG PO TABS: ORAL_TABLET | ORAL | 5 refills | Status: DC

## 2015-12-10 NOTE — Telephone Encounter (Signed)
Pt reports she is taking 10mg  a day.  Dr.  Venia Minks wrote it for 20mg  a day so it will save on her copay.  (She is on disability right now.)  She just cuts the tablets in half.   Thanks,   -Mickel Baas

## 2015-12-11 ENCOUNTER — Telehealth: Payer: Self-pay | Admitting: Surgery

## 2015-12-11 ENCOUNTER — Other Ambulatory Visit: Payer: Self-pay | Admitting: *Deleted

## 2015-12-11 DIAGNOSIS — C50511 Malignant neoplasm of lower-outer quadrant of right female breast: Secondary | ICD-10-CM

## 2015-12-11 NOTE — Telephone Encounter (Signed)
Patient had her port removed by Dr Dahlia Byes on 10/22/15. She can feel a clip under there near her clavicle. She states it is not a bone, a muscle, or a tendon. Please call and advise. She had a CT done in August if someone would like to view that.

## 2015-12-11 NOTE — Progress Notes (Signed)
Pt requested referral to [facility] for left breast implant removal. Referral sent

## 2015-12-12 ENCOUNTER — Encounter: Payer: Self-pay | Admitting: Physician Assistant

## 2016-01-01 ENCOUNTER — Encounter: Payer: Self-pay | Admitting: Physician Assistant

## 2016-01-01 DIAGNOSIS — E6609 Other obesity due to excess calories: Secondary | ICD-10-CM

## 2016-01-01 DIAGNOSIS — Z6833 Body mass index (BMI) 33.0-33.9, adult: Principal | ICD-10-CM

## 2016-01-02 MED ORDER — PHENTERMINE-TOPIRAMATE ER 3.75-23 MG PO CP24: 1.0000 | ORAL_CAPSULE | Freq: Every day | ORAL | 0 refills | Status: DC

## 2016-01-04 ENCOUNTER — Telehealth: Payer: Self-pay | Admitting: Surgery

## 2016-01-04 NOTE — Telephone Encounter (Signed)
Patient never received a call back. Look at last message from [place] please.

## 2016-01-04 NOTE — Telephone Encounter (Signed)
Returned phone call to patient at this time. She asked that imaging from August be reviewed by surgeon as she states that she is feeling a "clip" under the skin below her left clavicle. Placed patient on hold and CT images from 10/25/15 were reviewed with Dr. Adonis Huguenin at this time. He explained that there is no metallic object present but it does appear that she has calcium at the area of port insertion but that her body should dissolve this over time.  I explained this to the patient and she was not satisfied with this answer. I informed her that she would need to be seen for [place] to look at this area to determine what she is referring to as the CT is not indicating a "clip" in this area. She was placed on the schedule to see Dr. Dahlia Byes at next available date.  Patient agrees with plan.

## 2016-01-10 ENCOUNTER — Other Ambulatory Visit: Payer: Self-pay

## 2016-01-10 DIAGNOSIS — E6609 Other obesity due to excess calories: Secondary | ICD-10-CM

## 2016-01-10 DIAGNOSIS — Z6833 Body mass index (BMI) 33.0-33.9, adult: Principal | ICD-10-CM

## 2016-01-14 ENCOUNTER — Telehealth: Payer: Self-pay | Admitting: Physician Assistant

## 2016-01-14 NOTE — Telephone Encounter (Signed)
Pt called saying the pharmacy was sending in a PA for her weight loss medication.  She said to hold off for right now because she got a 2 week sample coupon from the pharmacy.  She wants to see if she can use it before we do the PA.  Her call back is [phone]  Thanks teri

## 2016-01-14 NOTE — Telephone Encounter (Signed)
Spoke with patient that I sent in the PA already on Friday. Per patient is ok, but that we can definitely  Re send another one once we get it. Thanks,  -Joseline

## 2016-01-17 ENCOUNTER — Ambulatory Visit: Payer: Managed Care, Other (non HMO) | Attending: Family Medicine | Admitting: Occupational Therapy

## 2016-01-17 DIAGNOSIS — I890 Lymphedema, not elsewhere classified: Secondary | ICD-10-CM | POA: Insufficient documentation

## 2016-01-17 DIAGNOSIS — I972 Postmastectomy lymphedema syndrome: Secondary | ICD-10-CM | POA: Insufficient documentation

## 2016-01-21 ENCOUNTER — Other Ambulatory Visit: Payer: Self-pay

## 2016-01-23 ENCOUNTER — Ambulatory Visit: Payer: Self-pay | Admitting: Surgery

## 2016-01-30 ENCOUNTER — Encounter: Payer: Self-pay | Admitting: Hematology and Oncology

## 2016-01-31 ENCOUNTER — Ambulatory Visit: Payer: Managed Care, Other (non HMO) | Admitting: Occupational Therapy

## 2016-01-31 DIAGNOSIS — I890 Lymphedema, not elsewhere classified: Secondary | ICD-10-CM | POA: Diagnosis not present

## 2016-01-31 DIAGNOSIS — I972 Postmastectomy lymphedema syndrome: Secondary | ICD-10-CM | POA: Diagnosis present

## 2016-01-31 NOTE — Patient Instructions (Signed)
Pt await garments - measured for new bilateral LE daytime compression  Daytime compression sleeve and glove for R UE  And Reidsleeve for R UE   Pt to cont with using pump for R UE  3-4 x wk and LE  1 x wk  She is using pump on face as needed
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
## 2016-02-01 ENCOUNTER — Other Ambulatory Visit: Payer: Self-pay

## 2016-02-01 MED ORDER — MELOXICAM 7.5 MG PO TABS: 7.5000 mg | ORAL_TABLET | ORAL | 0 refills | Status: DC | PRN

## 2016-02-04 ENCOUNTER — Ambulatory Visit: Payer: Self-pay | Admitting: Surgery

## 2016-02-18 ENCOUNTER — Other Ambulatory Visit: Payer: Self-pay | Admitting: Family Medicine

## 2016-02-18 DIAGNOSIS — E876 Hypokalemia: Secondary | ICD-10-CM

## 2016-03-27 ENCOUNTER — Ambulatory Visit: Payer: Managed Care, Other (non HMO) | Admitting: Physician Assistant

## 2016-04-01 ENCOUNTER — Ambulatory Visit (INDEPENDENT_AMBULATORY_CARE_PROVIDER_SITE_OTHER): Payer: BLUE CROSS/BLUE SHIELD | Admitting: Physician Assistant

## 2016-04-01 ENCOUNTER — Encounter: Payer: Self-pay | Admitting: Physician Assistant

## 2016-04-01 VITALS — BP 120/86 | HR 94 | Temp 98.5°F | Resp 16 | Wt 217.0 lb

## 2016-04-01 DIAGNOSIS — I890 Lymphedema, not elsewhere classified: Secondary | ICD-10-CM | POA: Diagnosis not present

## 2016-04-01 DIAGNOSIS — M5030 Other cervical disc degeneration, unspecified cervical region: Secondary | ICD-10-CM | POA: Diagnosis not present

## 2016-04-01 MED ORDER — OXYCODONE-ACETAMINOPHEN 5-325 MG PO TABS: 1.0000 | ORAL_TABLET | Freq: Three times a day (TID) | ORAL | 0 refills | Status: DC | PRN

## 2016-04-01 NOTE — Progress Notes (Signed)
Patient: Sabrina Morris Female    DOB: 26-Mar-1965   51 y.o.   MRN: [id number] Visit Date: 04/01/2016  Today's Provider: Mar Daring, PA-C   Chief Complaint  Patient presents with  . Follow-up  . Neck Pain   Subjective:    HPI  Follow up for acquired lymphedema  The patient was last seen for this 8 months ago. Changes made at last visit include no changes.  She reports good compliance with treatment. She feels that condition is Unchanged. She is not having side effects.   ------------------------------------------------------------------------------------ Patient c/o neck and back pain worsening. Patient had a scan done and was told that her cervical spine deteriorating. Patient report she was started on meloxicam for pain. Patient reports that her edema has increased since then. Patient is requesting a refill oxycodone-acetaminophen for the arthritis. She feels the meloxicam has led to her increased fluid retention that has worsened her lymphedema and discomfort.   Disability forms: Patient brought in paper work to continue her disability.    Allergies  Allergen Reactions  . Sumatriptan Other (See Comments) and Shortness Of Breath  . Prometrium [Progesterone Micronized] Other (See Comments)    dizziness  . Phentermine-Topiramate Other (See Comments)  . Sulfa Antibiotics   . Sulfamethoxazole Other (See Comments)  . Tamoxifen Other (See Comments)    Edema, excess fluid  . Zoladex  [Goserelin]   . Hydrocodone-Acetaminophen Rash    Hyperactivity     Current Outpatient Prescriptions:  .  buPROPion (WELLBUTRIN SR) 150 MG 12 hr tablet, TAKE ONE TABLET DAILY FOR 3 DAYS, THEN TAKE ONE TABLET TWICE A DAY, Disp: 60 tablet, Rfl: 5 .  Cholecalciferol (VITAMIN D3) 5000 units TABS, Take 1 tablet by mouth 1 day or 1 dose., Disp: , Rfl:  .  escitalopram (LEXAPRO) 20 MG tablet, Take 0.5 tablets (10 mg total) by mouth daily., Disp: 30 tablet, Rfl: 5 .  fluocinonide  (LIDEX) 0.05 % external solution, Apply 1 application topically 2 (two) times daily., Disp: 60 mL, Rfl: 3 .  furosemide (LASIX) 20 MG tablet, TAKE TWO TABLETS DAILY, Disp: 60 tablet, Rfl: 5 .  LORazepam (ATIVAN) 1 MG tablet, TAKE ONE TABLET TWICE A DAY AS NEEDED, Disp: 60 tablet, Rfl: 5 .  meloxicam (MOBIC) 7.5 MG tablet, Take 1 tablet (7.5 mg total) by mouth as needed for pain. Please take with food., Disp: 30 tablet, Rfl: 0 .  metaxalone (SKELAXIN) 800 MG tablet, Take 0.5-1 tablets (400-800 mg total) by mouth 3 (three) times daily as needed for muscle spasms., Disp: 30 tablet, Rfl: 3 .  Omeprazole-Sodium Bicarbonate (ZEGERID) 20-1100 MG CAPS capsule, Take 1 capsule by mouth 1 day or 1 dose., Disp: , Rfl:  .  potassium chloride (K-DUR,KLOR-CON) 10 MEQ tablet, TAKE 1 TABLET EVERY DAY, Disp: 90 tablet, Rfl: 1 .  rizatriptan (MAXALT) 10 MG tablet, TAKE ONE TABLET AT FIRST SIGN OF MIGRAINE SYMPTOMS--IF NO RELIEF, A SECOND TABLET MAY BE TAKEN IN 2 HOURS LIMIT 2/DAY 5/WEEK, Disp: 15 tablet, Rfl: 5 .  senna-docusate (SENOKOT-S) 8.6-50 MG tablet, Take by mouth. Reported on 09/19/2015, Disp: , Rfl:  .  oxyCODONE-acetaminophen (PERCOCET/ROXICET) 5-325 MG tablet, Take 1-2 tablets by mouth every 8 (eight) hours as needed. , Disp: , Rfl:  .  traZODone (DESYREL) 50 MG tablet, Take 1 tablet by mouth 1 day or 1 dose., Disp: , Rfl:   Review of Systems  Constitutional: Negative.   Respiratory: Negative.   Cardiovascular: Positive for  leg swelling. Negative for chest pain and palpitations.       Increased swelling all over  Gastrointestinal: Negative.   Endocrine: Negative.   Musculoskeletal: Positive for arthralgias and back pain.  Neurological: Negative.     Social History  Substance Use Topics  . Smoking status: Never Smoker  . Smokeless tobacco: Never Used  . Alcohol use No   Objective:   BP 120/86 (BP Location: Left Arm, Patient Position: Sitting, Cuff Size: Large)   Pulse 94   Temp 98.5 F (36.9  C) (Oral)   Resp 16   Wt 217 lb (98.4 kg)   SpO2 97%   BMI 35.02 kg/m   Physical Exam  Constitutional: She appears well-developed and well-nourished. No distress.  Neck: Normal range of motion. Neck supple. No tracheal deviation present. No thyromegaly present.  Cardiovascular: Normal rate, regular rhythm and normal heart sounds.  Exam reveals no gallop and no friction rub.   No murmur heard. Pulmonary/Chest: Effort normal and breath sounds normal. No respiratory distress. She has no wheezes. She has no rales.  Musculoskeletal: She exhibits edema.  Patient has increased lymphedema appearance. Symptoms have worsened and she is visibly retaining fluid/worsened lymphedema since the last time I saw her.  Lymphadenopathy:    She has no cervical adenopathy.  Skin: She is not diaphoretic.  Vitals reviewed.     Assessment & Plan:     1. DDD (degenerative disc disease), cervical Stable. Diagnosis pulled for medication refill. Continue current medical treatment plan. Followed by orthopedics. - oxyCODONE-acetaminophen (PERCOCET/ROXICET) 5-325 MG tablet; Take 1-2 tablets by mouth every 8 (eight) hours as needed.  Dispense: 30 tablet; Refill: 0  2. Acquired lymphedema Worsening. She is followed by multifactorial specialists for treatments. She feels currently worsened by meloxicam and wishes to discontinue. Disability forms completed for patient and given to her in the office.  3. Lymphedema of face  4. Lymphedema of both lower extremities       Mar Daring, PA-C  [facility]

## 2016-04-01 NOTE — Patient Instructions (Signed)
Lymphedema Introduction Lymphedema is swelling that is caused by the abnormal collection of lymph under the skin. Lymph is fluid from the tissues in your body that travels in the lymphatic system. This system is part of the immune system and includes lymph nodes and lymph vessels. The lymph vessels collect and carry the excess fluid, fats, proteins, and wastes from the tissues of the body to the bloodstream. This system also works to clean and remove bacteria and waste products from the body. Lymphedema occurs when the lymphatic system is blocked. When the lymph vessels or lymph nodes are blocked or damaged, lymph does not drain properly, causing an abnormal buildup of lymph. This leads to swelling in the arms or legs. Lymphedema cannot be cured by medicines, but various methods can be used to help reduce the swelling. What are the causes? There are two types of lymphedema. Primary lymphedema is caused by the absence or abnormality of the lymph vessel at birth. Secondary lymphedema is more common. It occurs when the lymph vessel is damaged or blocked. Common causes of lymph vessel blockage include:  Skin infection, such as cellulitis.  Infection by parasites (filariasis).  Injury.  Cancer.  Radiation therapy.  Formation of scar tissue.  Surgery. What are the signs or symptoms? Symptoms of this condition include:  Swelling of the arm or leg.  A heavy or tight feeling in the arm or leg.  Swelling of the feet, toes, or fingers. Shoes or rings may fit more tightly than before.  Redness of the skin over the affected area.  Limited movement of the affected limb.  Sensitivity to touch or discomfort in the affected limb. How is this diagnosed? This condition may be diagnosed with:  A physical exam.  Medical history.  Bioimpedance spectroscopy. In this test, painless electrical currents are used to measure fluid levels in your body.  Imaging tests, such as:  Lymphoscintigraphy. In  this test, a low dose of a radioactive substance is injected to trace the flow of lymph through the lymph vessels.  MRI.  CT scan.  Duplex ultrasound. This test uses sound waves to produce images of the vessels and the blood flow on a screen.  Lymphangiography. In this test, a contrast dye is injected into the lymph vessel to help show blockages. How is this treated? Treatment for this condition may depend on the cause. Treatment may include:  Exercise. Certain exercises can help fluid move out of the affected limb.  Massage. Gentle massage of the affected limb can help move the fluid out of the area.  Compression. Various methods may be used to apply pressure to the affected limb in order to reduce the swelling.  Wearing compression stockings or sleeves on the affected limb.  Bandaging the affected limb.  Using an external pump that is attached to a sleeve that alternates between applying pressure and releasing pressure.  Surgery. This is usually only done for severe cases. For example, surgery may be done if you have trouble moving the limb or if the swelling does not get better with other treatments. If an underlying condition is causing the lymphedema, treatment for that condition is needed. For example, antibiotic medicines may be used to treat an infection. Follow these instructions at home: Activities  Exercise regularly as directed by your health care provider.  Do not sit with your legs crossed.  When possible, keep the affected limb raised (elevated) above the level of your heart.  Avoid carrying things with an arm that is  affected by lymphedema.  Remember that the affected area is more likely to become injured or infected.  Take these steps to help prevent infection:  Keep the affected area clean and dry.  Protect your skin from cuts. For example, you should use gloves while cooking or gardening. Do not walk barefoot. If you shave the affected area, use an  Copy. General instructions  Take medicines only as directed by your health care provider.  Eat a healthy diet that includes a lot of fruits and vegetables.  Do not wear tight clothes, shoes, or jewelry.  Do not use heating pads over the affected area.  Avoid having blood pressure checked on the affected limb.  Keep all follow-up visits as directed by your health care provider. This is important. Contact a health care provider if:  You continue to have swelling in your limb.  You have a fever.  You have a cut that does not heal.  You have redness or pain in the affected area.  You have new swelling in your limb that comes on suddenly.  You develop purplish spots or sores (lesions) on your limb. Get help right away if:  You have a skin rash.  You have chills or sweats.  You have shortness of breath. This information is not intended to replace advice given to you by your health care provider. Make sure you discuss any questions you have with your health care provider. Document Released: 12/29/2006 Document Revised: 11/08/2015 Document Reviewed: 02/08/2014  2017 Elsevier

## 2016-04-04 ENCOUNTER — Ambulatory Visit: Payer: Self-pay | Admitting: Family Medicine

## 2016-04-07 ENCOUNTER — Encounter: Payer: Self-pay | Admitting: Physician Assistant

## 2016-04-09 ENCOUNTER — Telehealth: Payer: Self-pay | Admitting: Physician Assistant

## 2016-04-09 NOTE — Telephone Encounter (Signed)
Outpatient therapy needs order for outpatient OT for Ms. Kaneshiro faxed to them at [phone]

## 2016-04-09 NOTE — Telephone Encounter (Signed)
Written Rx given to [facility], CMA to be faxed.

## 2016-04-09 NOTE — Telephone Encounter (Signed)
Faxed

## 2016-04-11 ENCOUNTER — Telehealth: Payer: Self-pay | Admitting: Physician Assistant

## 2016-04-12 ENCOUNTER — Encounter: Payer: Self-pay | Admitting: Physician Assistant

## 2016-04-13 ENCOUNTER — Telehealth: Payer: Self-pay

## 2016-04-13 NOTE — Telephone Encounter (Signed)
Called patient due to call day and rescheduled her appt,a new appt was left for her.

## 2016-04-14 ENCOUNTER — Telehealth: Payer: Self-pay

## 2016-04-14 ENCOUNTER — Ambulatory Visit: Payer: BLUE CROSS/BLUE SHIELD | Admitting: Occupational Therapy

## 2016-04-14 NOTE — Telephone Encounter (Signed)
Pharmacy requesting refill Last ov 04/01/16  Please review. Thank you. sd

## 2016-04-14 NOTE — Telephone Encounter (Signed)
Estradiol 0.1 mg/ml and Progesterone 6% previously prescribed by a different provider. Please review. Thank you. sd

## 2016-04-15 NOTE — Telephone Encounter (Signed)
Patient has history of infiltrating ductal carcinoma of right breast, stage III, estrogen receptor positive, documented in Care Everywhere. I do not know who prescribed her this medication, presumably a historical provider for HRT? I do not feel comfortable prescribing this and this would be a better discussion for her oncologist or a gynecologist.

## 2016-04-16 ENCOUNTER — Encounter: Payer: Self-pay | Admitting: Physician Assistant

## 2016-04-16 DIAGNOSIS — F419 Anxiety disorder, unspecified: Secondary | ICD-10-CM

## 2016-04-16 NOTE — Telephone Encounter (Signed)
LMTCB

## 2016-04-16 NOTE — Telephone Encounter (Signed)
Patient states she gets the medication from a different provider. The pharmacy sent request to wrong provider. Please disregard.

## 2016-04-17 ENCOUNTER — Ambulatory Visit: Payer: Managed Care, Other (non HMO) | Admitting: Hematology and Oncology

## 2016-04-17 MED ORDER — LORAZEPAM 1 MG PO TABS: ORAL_TABLET | ORAL | 5 refills | Status: DC

## 2016-04-21 ENCOUNTER — Ambulatory Visit: Payer: BLUE CROSS/BLUE SHIELD | Attending: Family Medicine | Admitting: Occupational Therapy

## 2016-04-21 ENCOUNTER — Encounter: Payer: Self-pay | Admitting: Physician Assistant

## 2016-04-21 ENCOUNTER — Ambulatory Visit (HOSPITAL_BASED_OUTPATIENT_CLINIC_OR_DEPARTMENT_OTHER): Payer: BLUE CROSS/BLUE SHIELD | Admitting: Hematology and Oncology

## 2016-04-21 ENCOUNTER — Encounter: Payer: Self-pay | Admitting: Hematology and Oncology

## 2016-04-21 DIAGNOSIS — Z853 Personal history of malignant neoplasm of breast: Secondary | ICD-10-CM

## 2016-04-21 DIAGNOSIS — Z170 Estrogen receptor positive status [ER+]: Principal | ICD-10-CM

## 2016-04-21 DIAGNOSIS — I890 Lymphedema, not elsewhere classified: Secondary | ICD-10-CM | POA: Diagnosis not present

## 2016-04-21 DIAGNOSIS — I972 Postmastectomy lymphedema syndrome: Secondary | ICD-10-CM | POA: Insufficient documentation

## 2016-04-21 DIAGNOSIS — N951 Menopausal and female climacteric states: Secondary | ICD-10-CM | POA: Diagnosis not present

## 2016-04-21 DIAGNOSIS — F5101 Primary insomnia: Secondary | ICD-10-CM

## 2016-04-21 DIAGNOSIS — C50511 Malignant neoplasm of lower-outer quadrant of right female breast: Secondary | ICD-10-CM

## 2016-04-21 MED ORDER — TEMAZEPAM 15 MG PO CAPS: 15.0000 mg | ORAL_CAPSULE | Freq: Every evening | ORAL | 5 refills | Status: DC | PRN

## 2016-04-21 NOTE — Telephone Encounter (Signed)
Please call in Temazepam 15mg  Take 1-2 tabs PO q h.s. Prn sleep #60 5 RF to [facility]

## 2016-04-21 NOTE — Patient Instructions (Signed)
Bilateral UE and LE circumference - see flow sheet  Assess fit of daytime compression sleeve to R UE - pt do not have her wider ease at elbow - this one causing some redness  pt report sleeve sliding down -  Pt's upper arm measurements decrease since last time - but because her lymphedema fluctuate - would not recommend it being made smaller  Will as for remake of sleeve - with wider ease  Glove has volar pocket - but do not have pad   did cut her (Funktion) pad to use in one if indicated   Her bilateral LE measurement also decreased - sleeves appear to fit well - she report they are sliding down - they do have double  5 cm top bands  Pt report her previous sleeves come up higher  Again - it is more than 30 days out - and bands actually should hold her good - but will check with rep about compression with 2 top bands Pt can use roll on glue for  Arm and leg garments   Reidsleeve for R UE - pt did not bring gauze - will contact rep - to meet pt to setup  Facial measurements - pt  Increase in measurement from ear to ear under chin - 23.8 cm  ( was 23cm) But ear to corner of mouth - same 10 cm each side Ear to corner of nose - 12 cm on R , L 11 cm

## 2016-04-21 NOTE — Therapy (Signed)
[facility] [address] [place], [address] Phone: [phone]   Fax:  [phone]  Occupational Therapy Treatment  Patient Details  Name: Sabrina Morris MRN: [id number] Date of Birth: Aug 31, 1965 Referring Provider: Venia Minks  Encounter Date: 04/21/2016      OT End of Session - 04/21/16 1713    Visit Number 4   Number of Visits 4   Date for OT Re-Evaluation 04/21/16   OT Start Time 1143   OT Stop Time 1239   OT Time Calculation (min) 56 min   Activity Tolerance Patient tolerated treatment well   Behavior During Therapy [facility] for tasks assessed/performed      Past Medical History:  Diagnosis Date  . Adrenal disorder ([place])   . Breast cancer ([place])   . H/O bone density study 2015  . H/O colonoscopy    sch 06/12/15  . Lymphedema   . Lymphedema   . Migraine   . Pap smear for cervical cancer screening [id number]    Past Surgical History:  Procedure Laterality Date  . BREAST ENHANCEMENT SURGERY Bilateral 2005  . CARDIAC CATHETERIZATION  1994  . CARDIAC CATHETERIZATION  1994  . MASTECTOMY Right 02/2011  . PORT A CATH INJECTION ([place] [facility])  2013    There were no vitals filed for this visit.      Subjective Assessment - 04/21/16 1656    Subjective  I got my new day time and night time sleeves for R UE , and day sleeves for bilateral legs - but it is not fitting correct - my arm sleeve to loose and sliding down - glove pocket do not have pad -  and the the elbow do not have ease - and causing redness in my elbow -  the daytime sleeves for legs and sliding down - it is not as long as the previous ones  - but problem is I  got them more than 30 days ago and cannot return them -  with my sisters breast CA , by Dads stuff and then my  hormone, stress and water retention - it was just to much    Patient Stated Goals I just want my life back - the fludi taken care of -  that  I can  feel better , be healthier and  loose weight    Currently in Pain? No/denies             LYMPHEDEMA/ONCOLOGY QUESTIONNAIRE - 04/21/16 1153      Right Upper Extremity Lymphedema   15 cm Proximal to Olecranon Process 35.5 cm   10 cm Proximal to Olecranon Process 34.5 cm   Olecranon Process 28 cm   15 cm Proximal to Ulnar Styloid Process 28.2 cm   10 cm Proximal to Ulnar Styloid Process 24 cm   Just Proximal to Ulnar Styloid Process 16.5 cm   Across Hand at [employer] 19 cm   At [place] of 2nd Digit 6.5 cm   At [facility] of Thumb 6.5 cm     Left Upper Extremity Lymphedema   15 cm Proximal to Olecranon Process 33 cm   10 cm Proximal to Olecranon Process 30.4 cm   Olecranon Process 27.5 cm   15 cm Proximal to Ulnar Styloid Process 26.5 cm   10 cm Proximal to Ulnar Styloid Process 22.7 cm   Just Proximal to Ulnar Styloid Process 17 cm   Across Hand at [employer] 19 cm  At [facility] of 2nd Digit 7 cm   At [facility] of Thumb 6.5 cm     Right Lower Extremity Lymphedema   20 cm Proximal to Suprapatella 63 cm   At Midpatella/Popliteal Crease 40.5 cm   30 cm Proximal to Floor at Lateral Plantar Foot 41 cm   10 cm Proximal to Floor at Lateral Malleoli 23.5 cm   Circumference of ankle/heel 29.7 cm.   5 cm Proximal to 1st MTP Joint 21.9 cm   Across MTP Joint 21 cm   Around Proximal Great Toe 8.2 cm     Left Lower Extremity Lymphedema   20 cm Proximal to Suprapatella 64.1 cm   At Midpatella/Popliteal Crease 40 cm   30 cm Proximal to Floor at Lateral Plantar Foot 40.5 cm   10 cm Proximal to Floor at Lateral Malleoli 23.3 cm   5 cm Proximal to 1st MTP Joint 22 cm   Around Proximal Great Toe 8 cm       Bilateral UE and LE circumference - see flow sheet  Assess fit of daytime compression sleeve to R UE - pt do not have her wider ease at elbow - this one causing some redness  pt report sleeve sliding down -  Pt's upper arm measurements decrease since last time - but because her lymphedema fluctuate - would not  recommend it being made smaller  Will as for remake of sleeve - with wider ease  Glove has volar pocket - but do not have pad   did cut her (Funktion) pad to use in one if indicated   Her bilateral LE measurement also decreased - sleeves appear to fit well - she report they are sliding down - they do have double  5 cm top bands  Pt report her previous sleeves come up higher  Again - it is more than 30 days out - and bands actually should hold her good - but will check with rep about compression with 2 top bands Pt can use roll on glue for  Arm and leg garments   Reidsleeve for R UE - pt did not bring gauze - will contact rep - to meet pt to setup  Facial measurements - pt  Increase in measurement from ear to ear under chin - 23.8 cm  ( was 23cm) But ear to corner of mouth - same 10 cm each side Ear to corner of nose - 12 cm on R , L 11 cm                     OT Education - 04/21/16 1713    Education provided Yes   Education Details Garments wearing , use of glue - and cont with MLD by massage therapist as needed   Methods Explanation;Demonstration;Tactile cues;Verbal cues   Comprehension Returned demonstration;Verbalized understanding             OT Long Term Goals - 04/21/16 1718      OT LONG TERM GOAL #1   Title Pt to be ind in updated home program and new garments to keep lymphedema under control    Baseline assess garment fit - recommendations made for pt and rep - await respond if need to remade arm sleeve    Time 4   Period Weeks   Status On-going               Plan - 04/21/16 1714    Clinical Impression Statement Pt arrive with her new  garments for R UE day and night time - as well as daytime garments for bilateral LE - pt waiter more than 30 days to try them on - wil discuss with Rep if able to remade R UE garment with wider  ease to decrease redness in elbow  - pt circumference decrease in bilateral UE and LE - and because of that would not  recommend  garments to stay the same - they are sliding some what per pt - but she fluctuate with fluid - pt can use roll on glue to assist keeping garments up - she has in new garments for LE  a double band - will check with rep if that is correct and not causing turniquet -  pt has more swelling under chin compare to end of last year but face measure same    OT Frequency Biweekly   OT Duration 4 weeks   OT Treatment/Interventions Self-care/ADL training;DME and/or AE instruction;Patient/family education;Manual lymph drainage   Plan reassess if garments gets remade   OT Home Exercise Plan see pt instruction   Consulted and Agree with Plan of Care Patient      Patient will benefit from skilled therapeutic intervention in order to improve the following deficits and impairments:  Increased edema, Decreased activity tolerance  Visit Diagnosis: Lymphedema of both lower extremities  Post-mastectomy lymphedema syndrome  Lymphedema, not elsewhere classified    Problem List Patient Active Problem List   Diagnosis Date Noted  . Postmenopausal 07/02/2015  . Lymphedema of face 06/27/2015  . Hypokalemia 11/30/2014  . Accumulation of fluid in tissues 10/03/2014  . Hypercholesteremia 10/03/2014  . Acquired lymphedema 10/03/2014  . Menopausal and perimenopausal disorder 10/03/2014  . Borderline diabetes 10/03/2014  . Abnormal weight gain 10/03/2014  . Ventricular pre-excitation with arrhythmia 10/03/2014  . GERD (gastroesophageal reflux disease) 09/22/2014  . Anxiety 09/11/2014  . 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 Lymphedema of leg 01/04/2014  . History of asthma 10/29/2011  . Menopause praecox 09/10/2011  . Post-lymphadenectomy lymphedema of arm 09/10/2011  . Breast cancer of lower-outer quadrant of right female breast ([place]) 01/01/2011  . Headache, migraine 06/18/2006    Rosalyn Gess OTR/L,CLT 04/21/2016, 5:19 PM  [facility] [address] [place], [address] Phone: [phone]   Fax:  [phone]  Name: Sabrina Morris MRN: [id number] Date of Birth: 01/11/1966
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
## 2016-04-21 NOTE — Assessment & Plan Note (Signed)
Right breast cancer T3 N1 M0 stage IIIa invasive ductal carcinoma ER/PR positive HER-2 negative status post mastectomy December 2012, followed by 6 cycles of TAC completed June 2013 followed by radiation July 2013, started tamoxifen September 2013, started Zoladex January 2014, discontinued all antiestrogen therapy January 2015  Tamoxifen toxicities: Profound anasarca and severe weight gain from fluid buildup Lymphedema management: Patient undergoes manual lymphedema drainage procedures every day as well as sleep and lymphedema pumps to help with her lymphedema issues. Current treatment of swelling: Lasix 20 by mouth twice a day, phentermine, Adipex  Menopausal symptoms: Patient is currently receiving estrogen replacement therapy for the menopausal symptoms. She is fully aware that she is risking breast cancer relapse by taking estrogen replacement therapy.  Breast Cancer Surveillance: 1. Breast exam 04/21/2016: Right mastectomy, no palpable abnormalities, lymphedema 2. Mammogram left breast: 08/14/2015: No findings suspicious for malignancy. Breast density category B  Neck and face swelling: CT neck 11/01/2015: No evidence of venous thrombosis no acute process in the neck  Return to clinic in 6 months for surveillance and follow-up

## 2016-04-23 ENCOUNTER — Encounter: Payer: Self-pay | Admitting: Hematology and Oncology

## 2016-04-28 ENCOUNTER — Encounter: Payer: BLUE CROSS/BLUE SHIELD | Admitting: Physician Assistant

## 2016-05-02 ENCOUNTER — Encounter: Payer: Self-pay | Admitting: Hematology and Oncology

## 2016-05-05 ENCOUNTER — Other Ambulatory Visit: Payer: Self-pay

## 2016-05-05 DIAGNOSIS — C50511 Malignant neoplasm of lower-outer quadrant of right female breast: Secondary | ICD-10-CM

## 2016-05-05 DIAGNOSIS — Z170 Estrogen receptor positive status [ER+]: Principal | ICD-10-CM

## 2016-05-05 NOTE — Telephone Encounter (Signed)
Opened in error

## 2016-05-06 ENCOUNTER — Other Ambulatory Visit: Payer: Self-pay

## 2016-05-06 ENCOUNTER — Telehealth: Payer: Self-pay | Admitting: Hematology and Oncology

## 2016-05-06 DIAGNOSIS — C50511 Malignant neoplasm of lower-outer quadrant of right female breast: Secondary | ICD-10-CM

## 2016-05-06 DIAGNOSIS — Z170 Estrogen receptor positive status [ER+]: Principal | ICD-10-CM

## 2016-05-06 NOTE — Telephone Encounter (Signed)
Faxed records to Dr. Amparo Bristol office at [facility] for referral. Waiting for appt.

## 2016-05-13 ENCOUNTER — Encounter: Payer: BLUE CROSS/BLUE SHIELD | Admitting: Physician Assistant

## 2016-05-13 ENCOUNTER — Encounter: Payer: Self-pay | Admitting: Physician Assistant

## 2016-05-13 NOTE — Progress Notes (Deleted)
Patient: Sabrina Morris, Female    DOB: 1965-11-01, 51 y.o.   MRN: [id number] Visit Date: 05/13/2016  Today's Provider: Mar Daring, PA-C   No chief complaint on file.  Subjective:    Annual physical exam Sabrina Morris is a 51 y.o. female who presents today for health maintenance and complete physical. She feels {DESC; WELL/FAIRLY WELL/POORLY:18703}. She reports exercising ***. She reports she is sleeping {DESC; WELL/FAIRLY WELL/POORLY:18703}.  -----------------------------------------------------------------   Review of Systems  Constitutional: Negative.   HENT: Negative.   Eyes: Negative.   Respiratory: Negative.   Cardiovascular: Negative.   Gastrointestinal: Negative.   Endocrine: Negative.   Genitourinary: Negative.   Musculoskeletal: Negative.   Skin: Negative.   Allergic/Immunologic: Negative.   Neurological: Negative.   Hematological: Negative.   Psychiatric/Behavioral: Negative.     Social History      She  reports that she has never smoked. She has never used smokeless tobacco. She reports that she does not drink alcohol or use drugs.       Social History   Social History  . Marital status: Single    Spouse name: N/A  . Number of children: N/A  . Years of education: N/A   Occupational History  . 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 Smoking status: Never Smoker  . Smokeless tobacco: Never Used  . Alcohol use No  . Drug use: No  . Sexual activity: No   Other Topics Concern  . Not on file   Social History Narrative  . No narrative on file    Past Medical History:  Diagnosis Date  . Adrenal disorder ([place])   . Breast cancer ([place])   . H/O bone density study 2015  . H/O colonoscopy    sch 06/12/15  . Lymphedema   . Lymphedema   . Migraine   . Pap smear for cervical cancer screening [id number]     Patient Active Problem List   Diagnosis Date Noted  . Postmenopausal 07/02/2015  .  Lymphedema of face 06/27/2015  . Hypokalemia 11/30/2014  . Accumulation of fluid in tissues 10/03/2014  . Hypercholesteremia 10/03/2014  . Acquired lymphedema 10/03/2014  . Menopausal and perimenopausal disorder 10/03/2014  . Borderline diabetes 10/03/2014  . Abnormal weight gain 10/03/2014  . Ventricular pre-excitation with arrhythmia 10/03/2014  . GERD (gastroesophageal reflux disease) 09/22/2014  . Anxiety 09/11/2014  . 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 Lymphedema of leg 01/04/2014  . History of asthma 10/29/2011  . Menopause praecox 09/10/2011  . Post-lymphadenectomy lymphedema of arm 09/10/2011  . Breast cancer of lower-outer quadrant of right female breast ([place]) 01/01/2011  . Headache, migraine 06/18/2006    Past Surgical History:  Procedure Laterality Date  . BREAST ENHANCEMENT SURGERY Bilateral 2005  . CARDIAC CATHETERIZATION  1994  . CARDIAC CATHETERIZATION  1994  . MASTECTOMY Right 02/2011  . PORT A CATH INJECTION ([place] [facility]X)  2013    Family History        No family status information on file.        Her She was adopted. Family history is unknown by patient.     Allergies  Allergen Reactions  . Sumatriptan Other (See Comments) and Shortness Of Breath  . Prometrium [Progesterone Micronized] Other (See Comments)    dizziness  . Phentermine-Topiramate Other (See Comments)  . Sulfa Antibiotics Other (See Comments)    GI distress  . Sulfamethoxazole  Other (See Comments)  . Tamoxifen Other (See Comments)    Edema, excess fluid  . Zoladex  [Goserelin]   . Hydrocodone-Acetaminophen Rash    Hyperactivity     Current Outpatient Prescriptions:  .  AMBULATORY NON FORMULARY MEDICATION, Place 0.325 mg/mL onto the skin once. E2 estrogen cream Apply 0.4 ml transdermally nightly, Disp: , Rfl:  .  Cholecalciferol (VITAMIN D3) 5000 units TABS, Take 1 tablet by mouth 1 day or 1 dose., Disp: , Rfl:  .  DHEA 10 MG CAPS, Take 1 capsule by mouth daily., Disp: , Rfl:   .  fluocinonide (LIDEX) 0.05 % external solution, Apply 1 application topically 2 (two) times daily., Disp: 60 mL, Rfl: 3 .  furosemide (LASIX) 20 MG tablet, TAKE TWO TABLETS DAILY, Disp: 60 tablet, Rfl: 5 .  LORazepam (ATIVAN) 1 MG tablet, TAKE ONE TABLET TWICE A DAY AS NEEDED, Disp: 60 tablet, Rfl: 5 .  meloxicam (MOBIC) 7.5 MG tablet, Take 1 tablet (7.5 mg total) by mouth as needed for pain. Please take with food., Disp: 30 tablet, Rfl: 0 .  Omeprazole-Sodium Bicarbonate (ZEGERID) 20-1100 MG CAPS capsule, Take 1 capsule by mouth 1 day or 1 dose., Disp: , Rfl:  .  oxyCODONE-acetaminophen (PERCOCET/ROXICET) 5-325 MG tablet, Take 1-2 tablets by mouth every 8 (eight) hours as needed., Disp: 30 tablet, Rfl: 0 .  potassium chloride (K-DUR,KLOR-CON) 10 MEQ tablet, TAKE 1 TABLET EVERY DAY, Disp: 90 tablet, Rfl: 1 .  Progesterone 40 % CREA, Place 40 mg/mL onto the skin once. Apply 0.4 ml by transdermal route once nightly, Disp: , Rfl:  .  rizatriptan (MAXALT) 10 MG tablet, TAKE ONE TABLET AT FIRST SIGN OF MIGRAINE SYMPTOMS--IF NO RELIEF, A SECOND TABLET MAY BE TAKEN IN 2 HOURS LIMIT 2/DAY 5/WEEK, Disp: 15 tablet, Rfl: 5 .  senna-docusate (SENOKOT-S) 8.6-50 MG tablet, Take by mouth. Reported on 09/19/2015, Disp: , Rfl:  .  temazepam (RESTORIL) 15 MG capsule, Take 1-2 capsules (15-30 mg total) by mouth at bedtime as needed for sleep., Disp: 60 capsule, Rfl: 5   Patient Care Team: Mar Daring, PA-C as PCP - General (Family Medicine)      Objective:   Vitals: There were no vitals taken for this visit.   Physical Exam   Depression Screen PHQ 2/9 Scores 04/20/2015  PHQ - 2 Score 0      Assessment & Plan:     Routine Health Maintenance and Physical Exam  Exercise Activities and Dietary recommendations Goals    None       There is no immunization history on file for this patient.  Health Maintenance  Topic Date Due  . HIV Screening  12/21/1979  . TETANUS/TDAP  12/21/1983  .  INFLUENZA VACCINE  10/16/2015  . MAMMOGRAM  08/13/2017  . PAP SMEAR  04/29/2018  . COLONOSCOPY  06/11/2025     Discussed health benefits of physical activity, and encouraged her to engage in regular exercise appropriate for her age and condition.    --------------------------------------------------------------------    Mar Daring, PA-C  [place]

## 2016-05-19 ENCOUNTER — Telehealth: Payer: Self-pay | Admitting: Hematology and Oncology

## 2016-05-19 ENCOUNTER — Ambulatory Visit (INDEPENDENT_AMBULATORY_CARE_PROVIDER_SITE_OTHER): Payer: BLUE CROSS/BLUE SHIELD | Admitting: Physician Assistant

## 2016-05-19 ENCOUNTER — Encounter: Payer: Self-pay | Admitting: Physician Assistant

## 2016-05-19 VITALS — BP 110/80 | HR 103 | Temp 98.2°F | Resp 16 | Wt 217.6 lb

## 2016-05-19 DIAGNOSIS — Z131 Encounter for screening for diabetes mellitus: Secondary | ICD-10-CM

## 2016-05-19 DIAGNOSIS — E7800 Pure hypercholesterolemia, unspecified: Secondary | ICD-10-CM

## 2016-05-19 DIAGNOSIS — E876 Hypokalemia: Secondary | ICD-10-CM | POA: Diagnosis not present

## 2016-05-19 DIAGNOSIS — J40 Bronchitis, not specified as acute or chronic: Secondary | ICD-10-CM

## 2016-05-19 DIAGNOSIS — Z0000 Encounter for general adult medical examination without abnormal findings: Secondary | ICD-10-CM

## 2016-05-19 MED ORDER — HYDROCOD POLST-CPM POLST ER 10-8 MG/5ML PO SUER: 5.0000 mL | Freq: Two times a day (BID) | ORAL | 0 refills | Status: DC | PRN

## 2016-05-19 MED ORDER — ALBUTEROL SULFATE HFA 108 (90 BASE) MCG/ACT IN AERS: 1.0000 | INHALATION_SPRAY | Freq: Four times a day (QID) | RESPIRATORY_TRACT | 3 refills | Status: DC | PRN

## 2016-05-19 MED ORDER — LEVOFLOXACIN 500 MG PO TABS: 500.0000 mg | ORAL_TABLET | Freq: Every day | ORAL | 0 refills | Status: DC

## 2016-05-19 NOTE — Telephone Encounter (Signed)
Pt appt with Dr. Mayer Camel is 05/27/16 @ 11:00. Pt is aware.

## 2016-05-19 NOTE — Progress Notes (Signed)
Patient: Sabrina Morris, Female    DOB: 12-21-1965, 51 y.o.   MRN: [id number] Visit Date: 05/19/2016  Today's Provider: Mar Daring, PA-C   Chief Complaint  Patient presents with  . Annual Exam   Subjective:    Annual physical exam Sabrina Morris is a 51 y.o. female who presents today for health maintenance and complete physical. She feels fairly well. She reports exercising none. She reports she is sleeping poorly.  Breast Exam:04/21/16. She is followed by Dr. Lindi Adie (Hematology and Oncology) For Malignant neoplasm of lower outer quadrant of right breast. She has an appointment at [facility] next week with Alicia Copland.   Upper Respiratory Infection: Patient complains of symptoms of a URI. Symptoms include left ear pain. Onset of symptoms was a few days ago, gradually worsening since that time. She also c/o achiness, congestion and nasal congestion, sore throat, cough,headache,fatigue,poor appetite,chest tightness,SOB for the past few days .  She is drinking plenty of fluids. Per patient [facility]. Dx with Bronchitis. She also had Bronchitis mid January.    Review of Systems  Constitutional: Positive for chills and fatigue.  HENT: Positive for ear pain, facial swelling and sore throat.   Eyes: Negative.   Respiratory: Positive for apnea, cough and shortness of breath.   Cardiovascular: Positive for leg swelling.  Gastrointestinal: Positive for abdominal distention.  Endocrine: Negative.   Genitourinary: Negative.   Musculoskeletal: Positive for arthralgias, back pain, joint swelling, myalgias, neck pain and neck stiffness.  Skin: Negative.   Allergic/Immunologic: Negative.   Neurological: Positive for headaches.  Hematological: Negative.   Psychiatric/Behavioral: Negative.     Social History      She  reports that she has never smoked. She has never used smokeless tobacco. She  reports that she does not drink alcohol or use drugs.       Social History   Social History  . Marital status: Single    Spouse name: N/A  . Number of children: N/A  . Years of education: N/A   Occupational History  . 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 Smoking status: Never Smoker  . Smokeless tobacco: Never Used  . Alcohol use No  . Drug use: No  . Sexual activity: No   Other Topics Concern  . None   Social History Narrative  . None    Past Medical History:  Diagnosis Date  . Adrenal disorder ([place])   . Breast cancer ([place])   . H/O bone density study 2015  . H/O colonoscopy    sch 06/12/15  . Lymphedema   . Lymphedema   . Migraine   . Pap smear for cervical cancer screening [id number]     Patient Active Problem List   Diagnosis Date Noted  . Postmenopausal 07/02/2015  . Lymphedema of face 06/27/2015  . Hypokalemia 11/30/2014  . Accumulation of fluid in tissues 10/03/2014  . Hypercholesteremia 10/03/2014  . Acquired lymphedema 10/03/2014  . Menopausal and perimenopausal disorder 10/03/2014  . Borderline diabetes 10/03/2014  . Abnormal weight gain 10/03/2014  . Ventricular pre-excitation with arrhythmia 10/03/2014  . GERD (gastroesophageal reflux disease) 09/22/2014  . Anxiety 09/11/2014  . 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 Lymphedema of leg 01/04/2014  . History of asthma 10/29/2011  . Menopause praecox 09/10/2011  . Post-lymphadenectomy lymphedema of arm 09/10/2011  .  Breast cancer of lower-outer quadrant of right female breast ([place]) 01/01/2011  . Headache, migraine 06/18/2006    Past Surgical History:  Procedure Laterality Date  . BREAST ENHANCEMENT SURGERY Bilateral 2005  . CARDIAC CATHETERIZATION  1994  . CARDIAC CATHETERIZATION  1994  . MASTECTOMY Right 02/2011  . PORT A CATH INJECTION ([place] [facility])  2013    Family History        No family status information on file.        Her She was adopted.  Family history is unknown by patient.     Allergies  Allergen Reactions  . Sumatriptan Other (See Comments) and Shortness Of Breath  . Prometrium [Progesterone Micronized] Other (See Comments)    dizziness  . Phentermine-Topiramate Other (See Comments)  . Sulfa Antibiotics Other (See Comments)    GI distress  . Sulfamethoxazole Other (See Comments)  . Tamoxifen Other (See Comments)    Edema, excess fluid  . Zoladex  [Goserelin]   . Hydrocodone-Acetaminophen Rash    Hyperactivity     Current Outpatient Prescriptions:  .  AMBULATORY NON FORMULARY MEDICATION, Place 0.325 mg/mL onto the skin once. E2 estrogen cream Apply 0.4 ml transdermally nightly, Disp: , Rfl:  .  Cholecalciferol (VITAMIN D3) 5000 units TABS, Take 1 tablet by mouth 1 day or 1 dose., Disp: , Rfl:  .  DHEA 10 MG CAPS, Take 1 capsule by mouth daily., Disp: , Rfl:  .  fluocinonide (LIDEX) 0.05 % external solution, Apply 1 application topically 2 (two) times daily., Disp: 60 mL, Rfl: 3 .  furosemide (LASIX) 20 MG tablet, TAKE TWO TABLETS DAILY, Disp: 60 tablet, Rfl: 5 .  LORazepam (ATIVAN) 1 MG tablet, TAKE ONE TABLET TWICE A DAY AS NEEDED, Disp: 60 tablet, Rfl: 5 .  meloxicam (MOBIC) 7.5 MG tablet, Take 1 tablet (7.5 mg total) by mouth as needed for pain. Please take with food., Disp: 30 tablet, Rfl: 0 .  oxyCODONE-acetaminophen (PERCOCET/ROXICET) 5-325 MG tablet, Take 1-2 tablets by mouth every 8 (eight) hours as needed., Disp: 30 tablet, Rfl: 0 .  potassium chloride (K-DUR,KLOR-CON) 10 MEQ tablet, TAKE 1 TABLET EVERY DAY, Disp: 90 tablet, Rfl: 1 .  Progesterone 40 % CREA, Place 40 mg/mL onto the skin once. Apply 0.4 ml by transdermal route once nightly, Disp: , Rfl:  .  rizatriptan (MAXALT) 10 MG tablet, TAKE ONE TABLET AT FIRST SIGN OF MIGRAINE SYMPTOMS--IF NO RELIEF, A SECOND TABLET MAY BE TAKEN IN 2 HOURS LIMIT 2/DAY 5/WEEK, Disp: 15 tablet, Rfl: 5 .  senna-docusate (SENOKOT-S) 8.6-50 MG tablet, Take by mouth. Reported  on 09/19/2015, Disp: , Rfl:  .  temazepam (RESTORIL) 15 MG capsule, Take 1-2 capsules (15-30 mg total) by mouth at bedtime as needed for sleep., Disp: 60 capsule, Rfl: 5 .  Omeprazole-Sodium Bicarbonate (ZEGERID) 20-1100 MG CAPS capsule, Take 1 capsule by mouth 1 day or 1 dose., Disp: , Rfl:    Patient Care Team: Mar Daring, PA-C as PCP - General (Family Medicine)      Objective:   Vitals: BP 110/80 (BP Location: Left Arm, Patient Position: Sitting, Cuff Size: Normal)   Pulse (!) 103   Temp 98.2 F (36.8 C) (Oral)   Resp 16   Wt 217 lb 9.6 oz (98.7 kg)   SpO2 98%   BMI 36.21 kg/m     Physical Exam  Constitutional: She is oriented to person, place, and time. She appears well-developed and well-nourished. No distress.  HENT:  Head: Normocephalic and  atraumatic.  Right Ear: Hearing, tympanic membrane, external ear and ear canal normal.  Left Ear: Hearing, external ear and ear canal normal. Tympanic membrane is not erythematous and not bulging. A middle ear effusion is present.  Nose: Mucosal edema and rhinorrhea present. Right sinus exhibits no maxillary sinus tenderness and no frontal sinus tenderness. Left sinus exhibits no maxillary sinus tenderness and no frontal sinus tenderness.  Mouth/Throat: Uvula is midline, oropharynx is clear and moist and mucous membranes are normal. No oropharyngeal exudate or posterior oropharyngeal edema.  Eyes: Conjunctivae and EOM are normal. Pupils are equal, round, and reactive to light. Right eye exhibits no discharge. Left eye exhibits no discharge. No scleral icterus.  Neck: Normal range of motion. Neck supple. No JVD present. No tracheal deviation present. No thyromegaly present.  Cardiovascular: Normal rate, regular rhythm, normal heart sounds and intact distal pulses.  Exam reveals no gallop and no friction rub.   No murmur heard. Pulmonary/Chest: Effort normal. No respiratory distress. She has decreased breath sounds. She has wheezes.  She has no rhonchi. She has no rales. She exhibits no tenderness.  Abdominal: Soft. Bowel sounds are normal. She exhibits no distension and no mass. There is no tenderness. There is no rebound and no guarding.  Musculoskeletal: Normal range of motion. She exhibits no edema or tenderness.  Lymphadenopathy:    She has no cervical adenopathy.  Neurological: She is alert and oriented to person, place, and time.  Skin: Skin is warm and dry. No rash noted. She is not diaphoretic.  Psychiatric: She has a normal mood and affect. Her behavior is normal. Judgment and thought content normal.  Vitals reviewed.    Depression Screen PHQ 2/9 Scores 05/19/2016 04/20/2015  PHQ - 2 Score 0 0  PHQ- 9 Score 2 -      Assessment & Plan:     Routine Health Maintenance and Physical Exam  Exercise Activities and Dietary recommendations Goals    None       There is no immunization history on file for this patient.  Health Maintenance  Topic Date Due  . HIV Screening  12/21/1979  . TETANUS/TDAP  12/21/1983  . INFLUENZA VACCINE  10/16/2015  . MAMMOGRAM  08/13/2017  . PAP SMEAR  04/29/2018  . COLONOSCOPY  06/11/2025     Discussed health benefits of physical activity, and encouraged her to engage in regular exercise appropriate for her age and condition.    1. Annual physical exam Normal physical exam today. Will check labs as below and f/u pending lab results. If labs are stable and WNL she will not need to have these rechecked for one year at her next annual physical exam. She is to call the office in the meantime if she has any acute issue, questions or concerns. - CBC w/Diff/Platelet - Comprehensive Metabolic Panel (CMET) - TSH  2. Bronchitis Worsening symptoms. Has had off and on since Dec. 2017. Will give levaquin and albuterol inhaler as below. Tussionex given for cough suppression. She is to call if symptoms worsen. - levofloxacin (LEVAQUIN) 500 MG tablet; Take 1 tablet (500 mg total) by  mouth daily.  Dispense: 10 tablet; Refill: 0 - albuterol (PROVENTIL HFA;VENTOLIN HFA) 108 (90 Base) MCG/ACT inhaler; Inhale 1-2 puffs into the lungs every 6 (six) hours as needed for wheezing or shortness of breath.  Dispense: 1 Inhaler; Refill: 3 - chlorpheniramine-HYDROcodone (TUSSIONEX PENNKINETIC ER) 10-8 MG/5ML SUER; Take 5 mLs by mouth every 12 (twelve) hours as needed for cough.  Dispense:  180 mL; Refill: 0  3. Hypercholesteremia Will check labs as below and f/u pending results. - Lipid Profile  4. Hypokalemia Will check labs as below and f/u pending results. On lasix.  - Comprehensive Metabolic Panel (CMET)  5. Diabetes mellitus screening Will check labs as below and f/u pending results. - HgB A1c  --------------------------------------------------------------------    Mar Daring, PA-C  [facility]

## 2016-05-19 NOTE — Patient Instructions (Signed)
Health Maintenance for Postmenopausal Women Menopause is a normal process in which your reproductive ability comes to an end. This process happens gradually over a span of months to years, usually between the ages of 33 and 38. Menopause is complete when you have missed 12 consecutive menstrual periods. It is important to talk with your health care provider about some of the most common conditions that affect postmenopausal women, such as heart disease, cancer, and bone loss (osteoporosis). Adopting a healthy lifestyle and getting preventive care can help to promote your health and wellness. Those actions can also lower your chances of developing some of these common conditions. What should I know about menopause? During menopause, you may experience a number of symptoms, such as:  Moderate-to-severe hot flashes.  Night sweats.  Decrease in sex drive.  Mood swings.  Headaches.  Tiredness.  Irritability.  Memory problems.  Insomnia. Choosing to treat or not to treat menopausal changes is an individual decision that you make with your health care provider. What should I know about hormone replacement therapy and supplements? Hormone therapy products are effective for treating symptoms that are associated with menopause, such as hot flashes and night sweats. Hormone replacement carries certain risks, especially as you become older. If you are thinking about using estrogen or estrogen with progestin treatments, discuss the benefits and risks with your health care provider. What should I know about heart disease and stroke? Heart disease, heart attack, and stroke become more likely as you age. This may be due, in part, to the hormonal changes that your body experiences during menopause. These can affect how your body processes dietary fats, triglycerides, and cholesterol. Heart attack and stroke are both medical emergencies. There are many things that you can do to help prevent heart disease  and stroke:  Have your blood pressure checked at least every 1-2 years. High blood pressure causes heart disease and increases the risk of stroke.  If you are 48-61 years old, ask your health care provider if you should take aspirin to prevent a heart attack or a stroke.  Do not use any tobacco products, including cigarettes, chewing tobacco, or electronic cigarettes. If you need help quitting, ask your health care provider.  It is important to eat a healthy diet and maintain a healthy weight.  Be sure to include plenty of vegetables, fruits, low-fat dairy products, and lean protein.  Avoid eating foods that are high in solid fats, added sugars, or salt (sodium).  Get regular exercise. This is one of the most important things that you can do for your health.  Try to exercise for at least 150 minutes each week. The type of exercise that you do should increase your heart rate and make you sweat. This is known as moderate-intensity exercise.  Try to do strengthening exercises at least twice each week. Do these in addition to the moderate-intensity exercise.  Know your numbers.Ask your health care provider to check your cholesterol and your blood glucose. Continue to have your blood tested as directed by your health care provider. What should I know about cancer screening? There are several types of cancer. Take the following steps to reduce your risk and to catch any cancer development as early as possible. Breast Cancer  Practice breast self-awareness.  This means understanding how your breasts normally appear and feel.  It also means doing regular breast self-exams. Let your health care provider know about any changes, no matter how small.  If you are 40 or older,  have a clinician do a breast exam (clinical breast exam or CBE) every year. Depending on your age, family history, and medical history, it may be recommended that you also have a yearly breast X-ray (mammogram).  If you  have a family history of breast cancer, talk with your health care provider about genetic screening.  If you are at high risk for breast cancer, talk with your health care provider about having an MRI and a mammogram every year.  Breast cancer (BRCA) gene test is recommended for women who have family members with BRCA-related cancers. Results of the assessment will determine the need for genetic counseling and BRCA1 and for BRCA2 testing. BRCA-related cancers include these types:  Breast. This occurs in males or females.  Ovarian.  Tubal. This may also be called fallopian tube cancer.  Cancer of the abdominal or pelvic lining (peritoneal cancer).  Prostate.  Pancreatic. Cervical, Uterine, and Ovarian Cancer  Your health care provider may recommend that you be screened regularly for cancer of the pelvic organs. These include your ovaries, uterus, and vagina. This screening involves a pelvic exam, which includes checking for microscopic changes to the surface of your cervix (Pap test).  For women ages 21-65, health care providers may recommend a pelvic exam and a Pap test every three years. For women ages 23-65, they may recommend the Pap test and pelvic exam, combined with testing for human papilloma virus (HPV), every five years. Some types of HPV increase your risk of cervical cancer. Testing for HPV may also be done on women of any age who have unclear Pap test results.  Other health care providers may not recommend any screening for nonpregnant women who are considered low risk for pelvic cancer and have no symptoms. Ask your health care provider if a screening pelvic exam is right for you.  If you have had past treatment for cervical cancer or a condition that could lead to cancer, you need Pap tests and screening for cancer for at least 20 years after your treatment. If Pap tests have been discontinued for you, your risk factors (such as having a new sexual partner) need to be reassessed  to determine if you should start having screenings again. Some women have medical problems that increase the chance of getting cervical cancer. In these cases, your health care provider may recommend that you have screening and Pap tests more often.  If you have a family history of uterine cancer or ovarian cancer, talk with your health care provider about genetic screening.  If you have vaginal bleeding after reaching menopause, tell your health care provider.  There are currently no reliable tests available to screen for ovarian cancer. Lung Cancer  Lung cancer screening is recommended for adults 99-83 years old who are at high risk for lung cancer because of a history of smoking. A yearly low-dose CT scan of the lungs is recommended if you:  Currently smoke.  Have a history of at least 30 pack-years of smoking and you currently smoke or have quit within the past 15 years. A pack-year is smoking an average of one pack of cigarettes per day for one year. Yearly screening should:  Continue until it has been 15 years since you quit.  Stop if you develop a health problem that would prevent you from having lung cancer treatment. Colorectal Cancer  This type of cancer can be detected and can often be prevented.  Routine colorectal cancer screening usually begins at age 72 and continues  through age 75.  If you have risk factors for colon cancer, your health care provider may recommend that you be screened at an earlier age.  If you have a family history of colorectal cancer, talk with your health care provider about genetic screening.  Your health care provider may also recommend using home test kits to check for hidden blood in your stool.  A small camera at the end of a tube can be used to examine your colon directly (sigmoidoscopy or colonoscopy). This is done to check for the earliest forms of colorectal cancer.  Direct examination of the colon should be repeated every 5-10 years until  age 75. However, if early forms of precancerous polyps or small growths are found or if you have a family history or genetic risk for colorectal cancer, you may need to be screened more often. Skin Cancer  Check your skin from head to toe regularly.  Monitor any moles. Be sure to tell your health care provider:  About any new moles or changes in moles, especially if there is a change in a mole's shape or color.  If you have a mole that is larger than the size of a pencil eraser.  If any of your family members has a history of skin cancer, especially at a young age, talk with your health care provider about genetic screening.  Always use sunscreen. Apply sunscreen liberally and repeatedly throughout the day.  Whenever you are outside, protect yourself by wearing long sleeves, pants, a wide-brimmed hat, and sunglasses. What should I know about osteoporosis? Osteoporosis is a condition in which bone destruction happens more quickly than new bone creation. After menopause, you may be at an increased risk for osteoporosis. To help prevent osteoporosis or the bone fractures that can happen because of osteoporosis, the following is recommended:  If you are 19-50 years old, get at least 1,000 mg of calcium and at least 600 mg of vitamin D per day.  If you are older than age 50 but younger than age 70, get at least 1,200 mg of calcium and at least 600 mg of vitamin D per day.  If you are older than age 70, get at least 1,200 mg of calcium and at least 800 mg of vitamin D per day. Smoking and excessive alcohol intake increase the risk of osteoporosis. Eat foods that are rich in calcium and vitamin D, and do weight-bearing exercises several times each week as directed by your health care provider. What should I know about how menopause affects my mental health? Depression may occur at any age, but it is more common as you become older. Common symptoms of depression include:  Low or sad  mood.  Changes in sleep patterns.  Changes in appetite or eating patterns.  Feeling an overall lack of motivation or enjoyment of activities that you previously enjoyed.  Frequent crying spells. Talk with your health care provider if you think that you are experiencing depression. What should I know about immunizations? It is important that you get and maintain your immunizations. These include:  Tetanus, diphtheria, and pertussis (Tdap) booster vaccine.  Influenza every year before the flu season begins.  Pneumonia vaccine.  Shingles vaccine. Your health care provider may also recommend other immunizations. This information is not intended to replace advice given to you by your health care provider. Make sure you discuss any questions you have with your health care provider. Document Released: 04/25/2005 Document Revised: 09/21/2015 Document Reviewed: 12/05/2014 Elsevier Interactive Patient   Education  2017 [employer]  

## 2016-05-21 ENCOUNTER — Telehealth: Payer: Self-pay

## 2016-05-21 DIAGNOSIS — J40 Bronchitis, not specified as acute or chronic: Secondary | ICD-10-CM

## 2016-05-21 MED ORDER — PREDNISONE 10 MG (21) PO TBPK: ORAL_TABLET | ORAL | 0 refills | Status: DC

## 2016-05-21 NOTE — Telephone Encounter (Signed)
Prednisone sent to [facility].

## 2016-05-21 NOTE — Telephone Encounter (Signed)
Pt was seen on 05/19/2016 for CPE, and was treated for bronchitis. She was prescribed Levaquin, Proventil, Tussionex. Pt is no better, and is requesting 6 day Prednisone Taper be sent to [facility]. [name], CMA

## 2016-05-29 ENCOUNTER — Encounter: Payer: Self-pay | Admitting: Obstetrics and Gynecology

## 2016-05-29 ENCOUNTER — Ambulatory Visit (INDEPENDENT_AMBULATORY_CARE_PROVIDER_SITE_OTHER): Payer: Self-pay | Admitting: Obstetrics and Gynecology

## 2016-05-29 VITALS — BP 110/70 | HR 93 | Ht 65.0 in | Wt 215.0 lb

## 2016-05-29 DIAGNOSIS — Z803 Family history of malignant neoplasm of breast: Secondary | ICD-10-CM

## 2016-05-29 DIAGNOSIS — Z124 Encounter for screening for malignant neoplasm of cervix: Secondary | ICD-10-CM

## 2016-05-29 DIAGNOSIS — Z1151 Encounter for screening for human papillomavirus (HPV): Secondary | ICD-10-CM

## 2016-05-29 DIAGNOSIS — Z79890 Hormone replacement therapy (postmenopausal): Secondary | ICD-10-CM

## 2016-05-29 DIAGNOSIS — Z01419 Encounter for gynecological examination (general) (routine) without abnormal findings: Secondary | ICD-10-CM

## 2016-05-29 NOTE — Progress Notes (Addendum)
HPI:      [name] is a 51 y.o. G0P0000 who LMP was No LMP recorded. Patient is postmenopausal., presents today for her annual examination.  Her menses are absent.  She does not have intermenstrual bleeding.  She does not have vasomotor sx. She uses estradiol crm, testosterone crm, and progesterone crm meds, prescribed by NP in [facility]. (See note below). She has had saliva testing done since I saw her 1/8 and her HRT dosages have been changed. She has negative estrogen levels and is progesterone dominant. Testosterone is low as well, so pt has added testosterone crm to Pace. She plans to f/u with NP in [facility] until her sx are controlled, and then we can manage Rx RF.  She is not sexually active. She does have vaginal dryness.  Last Pap: Last yr.  Results were: no abnormalities   Last mammogram: 5/17. Results were: normal--routine follow-up in 12 months There is a FH of breast cancer now in her sister, age 8. She had neg genetic testing at [facility]. Pt had neg BRCA testing at [facility] in the past. There is no FH of ovarian cancer. The patient does do self-breast exams.  Colonoscopy: colonoscopy 1 years ago with abnormalities. Repeat due 2020.   Tobacco use: The patient denies current or previous tobacco use. Alcohol use: none Exercise: not active  She does get adequate calcium and Vitamin D in her diet.   04/01/16 NOTE: She does have vasomotor symptoms, as well as extreme fatigue, sleep issues, joint and bone pain, memory issues, and exercise intolerance. She was put on bioidentical estrogen and progesterone for sx with some improvement. She has a complicated story. She is s/p ER+/PR+ breast cancer 2012. She did tamoxifen and pt states it ruined her. She stopped it and feels like she is dying. She is constantly swollen and has had to start lymphedema treatments. She also has adrenal fatigue. She had several MDs tell her the sx were related to menopause, but no one would give her HRT due  to cancer hx. She saw a Dr. Lindi Adie, oncologist in [place], who gave her the ok to do HRT (see letter in [place] giving permission to treat pt with HRT). She started seeing a NP in [place] who does bioidentical HRT. She was started on progesterone and estradiol 8/17 after 7/17 saliva testing showed low estrogen and progesterone dominance. Her doses were altered after 10/17 labs and pt had sx improvement for about 3 wks 11/17. Her fluid retention drastically decreased and pt felt so much better. Sx resumed and pt's HRT doses were adjusted again after repeat saliva testing. Pt has a Rx at the post office now (shipped from compounding pharm in [place]) and she is ready to start them. She would like [place] to manage her HRT since she lives locally. She is using combination estrogen 0.0375 mg/ml, 0.4 ml dose and prog crm 40%, 0.6 ml every night. She is also taking DHEA 10 mg daily for low testosterone/DHEA values. She doesn't sleep well and doesn't even nap well. Any exercise makes her feel worse. She is unable to work currently given her sx. She has to do regular lymphedema pump tx.  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 She realizes the risk of recurrence, but feels so bad wihtout HRT, that she is willing to accept that risk. She understands that Rx HRT with her hx is off label. I discussed  this with Dr. Kenton Kingfisher. Since we have a letter from an oncologist giving his approval for HRT use with this pt, I can Rx HRT.   Past Medical History:  Diagnosis Date  . Adrenal disorder ([place])   . Breast cancer ([place])   . H/O bone density study 2015  . H/O colonoscopy    sch 06/12/15  . Lymphedema   . Lymphedema   . Migraine   . Pap smear for cervical cancer screening [id number]    Past Surgical History:  Procedure Laterality Date  . BREAST ENHANCEMENT SURGERY Bilateral 2005  . CARDIAC CATHETERIZATION  1994  . CARDIAC CATHETERIZATION  1994  . MASTECTOMY Right 02/2011  . PORT  A CATH INJECTION ([place] [facility])  2013    Family History  Problem Relation Age of Onset  . Adopted: Yes  . Breast cancer Sister 30    1/2 sister; Genetic testing neg at [facility]     ROS:  Review of Systems  Constitutional: Negative for fever, malaise/fatigue and weight loss.  HENT: Negative for congestion, ear pain and sinus pain.   Respiratory: Negative for cough, shortness of breath and wheezing.   Cardiovascular: Negative for chest pain, orthopnea and leg swelling.  Gastrointestinal: Negative for constipation, diarrhea, nausea and vomiting.  Genitourinary: Negative for dysuria, frequency, hematuria and urgency.       Breast ROS: negative   Musculoskeletal: Negative for back pain, joint pain and myalgias.  Skin: Negative for itching and rash.  Neurological: Negative for dizziness, tingling, focal weakness and headaches.  Endo/Heme/Allergies: Negative for environmental allergies. Does not bruise/bleed easily.  Psychiatric/Behavioral: Negative for depression and suicidal ideas. The patient is not nervous/anxious and does not have insomnia.     Objective: BP 110/70   Pulse 93   Ht _0  (1.651 m)   Wt 215 lb (97.5 kg)   BMI 35.78 kg/m    Physical Exam  Constitutional: She is oriented to person, place, and time. Vital signs are normal. She appears well-developed and well-nourished.  Genitourinary: Vagina normal and uterus normal. There is no rash, tenderness, lesion or Bartholin's cyst on the right labia. There is no rash, tenderness, lesion or Bartholin's cyst on the left labia. No erythema, tenderness or bleeding in the vagina. No vaginal discharge found. Right adnexum does not display mass and does not display tenderness. Left adnexum does not display mass and does not display tenderness. Cervix does not exhibit motion tenderness, discharge, friability or polyp. Uterus is not enlarged or tender.  Neck: Normal range of motion. No thyromegaly present.  Cardiovascular: Normal rate,  regular rhythm and normal heart sounds.   No murmur heard. Pulmonary/Chest: Effort normal and breath sounds normal. Right breast exhibits no mass, no nipple discharge, no skin change and no tenderness. Left breast exhibits no mass, no nipple discharge, no skin change and no tenderness.  Abdominal: Soft. There is no tenderness. There is no guarding.  Musculoskeletal: Normal range of motion.  Neurological: She is alert and oriented to person, place, and time. No cranial nerve deficit.  Psychiatric: She has a normal mood and affect. Her behavior is normal.  Nursing note and vitals reviewed.    Assessment/Plan: Encounter for annual routine gynecological examination  Cervical cancer screening - Plan: IGP, Aptima HPV  Screening for HPV (human papillomavirus) - Plan: IGP, Aptima HPV  Hormone replacement therapy (HRT) - Pt being managed by NP in [facility] with bioidentical hormones. Once sx are stable, we can take over Rx RF. F/u prn.  Family  history of breast cancer - Pt's affected sister is "gene neg", but pt still qualifies for Update My Risk testing (since 1/2 sisters). F/u prn.           GYN counsel use and side effects of HRT, adequate intake of calcium and vitamin D, diet and exercise     F/U  Return in about 1 year (around 05/29/2017).  Casha Estupinan B. Camile Esters, PA-C 05/29/2016 10:05 AM

## 2016-05-31 LAB — IGP, APTIMA HPV
HPV Aptima: NEGATIVE
PAP SMEAR COMMENT: 0

## 2016-06-19 ENCOUNTER — Encounter: Payer: Self-pay | Admitting: Physician Assistant

## 2016-06-19 DIAGNOSIS — Z1239 Encounter for other screening for malignant neoplasm of breast: Secondary | ICD-10-CM

## 2016-06-19 DIAGNOSIS — R600 Localized edema: Secondary | ICD-10-CM

## 2016-06-19 DIAGNOSIS — Z853 Personal history of malignant neoplasm of breast: Secondary | ICD-10-CM

## 2016-06-23 ENCOUNTER — Encounter: Payer: Self-pay | Admitting: Physician Assistant

## 2016-06-30 ENCOUNTER — Other Ambulatory Visit: Payer: Self-pay | Admitting: Physician Assistant

## 2016-06-30 ENCOUNTER — Other Ambulatory Visit: Payer: Self-pay

## 2016-06-30 DIAGNOSIS — R600 Localized edema: Secondary | ICD-10-CM

## 2016-06-30 DIAGNOSIS — Z853 Personal history of malignant neoplasm of breast: Secondary | ICD-10-CM

## 2016-07-01 ENCOUNTER — Other Ambulatory Visit: Payer: BLUE CROSS/BLUE SHIELD

## 2016-07-05 ENCOUNTER — Other Ambulatory Visit: Payer: Self-pay | Admitting: Physician Assistant

## 2016-07-05 DIAGNOSIS — I890 Lymphedema, not elsewhere classified: Secondary | ICD-10-CM

## 2016-07-07 ENCOUNTER — Other Ambulatory Visit: Payer: BLUE CROSS/BLUE SHIELD

## 2016-07-10 ENCOUNTER — Other Ambulatory Visit: Payer: BLUE CROSS/BLUE SHIELD

## 2016-07-10 ENCOUNTER — Telehealth: Payer: Self-pay | Admitting: Physician Assistant

## 2016-07-10 DIAGNOSIS — K219 Gastro-esophageal reflux disease without esophagitis: Secondary | ICD-10-CM

## 2016-07-10 DIAGNOSIS — R5383 Other fatigue: Secondary | ICD-10-CM

## 2016-07-10 DIAGNOSIS — E7800 Pure hypercholesterolemia, unspecified: Secondary | ICD-10-CM

## 2016-07-10 DIAGNOSIS — E876 Hypokalemia: Secondary | ICD-10-CM

## 2016-07-10 DIAGNOSIS — R7303 Prediabetes: Secondary | ICD-10-CM

## 2016-07-10 NOTE — Telephone Encounter (Signed)
Please review. Ok to order?  

## 2016-07-10 NOTE — Telephone Encounter (Signed)
Labs re-ordered

## 2016-07-10 NOTE — Telephone Encounter (Signed)
Pt states she was seen in January and was suppose to have labs done.  Pt has not had these done and is requesting a new lab slip to go have these.  Pt is also  requesting her vitamin D checked.  NA#[phone]/[id number]

## 2016-07-14 ENCOUNTER — Encounter: Payer: Self-pay | Admitting: Physician Assistant

## 2016-07-14 ENCOUNTER — Telehealth: Payer: Self-pay

## 2016-07-14 ENCOUNTER — Ambulatory Visit
Admission: RE | Admit: 2016-07-14 | Discharge: 2016-07-14 | Disposition: A | Discharge status: Home / Self Care | Payer: BLUE CROSS/BLUE SHIELD | Source: Ambulatory Visit | Attending: Physician Assistant | Admitting: Physician Assistant

## 2016-07-14 ENCOUNTER — Other Ambulatory Visit: Payer: Self-pay | Admitting: Physician Assistant

## 2016-07-14 DIAGNOSIS — Z853 Personal history of malignant neoplasm of breast: Secondary | ICD-10-CM

## 2016-07-14 DIAGNOSIS — R600 Localized edema: Secondary | ICD-10-CM

## 2016-07-14 DIAGNOSIS — R220 Localized swelling, mass and lump, head: Secondary | ICD-10-CM

## 2016-07-14 DIAGNOSIS — I890 Lymphedema, not elsewhere classified: Secondary | ICD-10-CM

## 2016-07-14 NOTE — Telephone Encounter (Signed)
-----   Message from Mar Daring, [place] sent at 07/14/2016 11:23 AM EDT ----- Left breast mammo normal and [place] of left axilla normal.

## 2016-07-14 NOTE — Telephone Encounter (Signed)
Pt advised. Emily Drozdowski, CMA  

## 2016-07-20 ENCOUNTER — Other Ambulatory Visit: Payer: Self-pay | Admitting: Physician Assistant

## 2016-07-20 DIAGNOSIS — M5030 Other cervical disc degeneration, unspecified cervical region: Secondary | ICD-10-CM

## 2016-07-21 MED ORDER — OXYCODONE-ACETAMINOPHEN 5-325 MG PO TABS: 1.0000 | ORAL_TABLET | Freq: Three times a day (TID) | ORAL | 0 refills | Status: DC | PRN

## 2016-07-21 NOTE — Telephone Encounter (Signed)
Percocet 5-325mg  refilled. [place] substance registry checked and patient has not filled since 03/2016. Takes only prn for severe pain

## 2016-07-22 LAB — VITAMIN D 25 HYDROXY (VIT D DEFICIENCY, FRACTURES): Vit D, 25-Hydroxy: 55.7 ng/mL (ref 30.0–100.0)

## 2016-07-22 LAB — CBC WITH DIFFERENTIAL/PLATELET
Basophils Absolute: 0 10*3/uL (ref 0.0–0.2)
Basos: 0 %
EOS (ABSOLUTE): 0.2 10*3/uL (ref 0.0–0.4)
Eos: 3 %
Hematocrit: 45.6 % (ref 34.0–46.6)
Hemoglobin: 14.9 g/dL (ref 11.1–15.9)
IMMATURE GRANULOCYTES: 0 %
Immature Grans (Abs): 0 10*3/uL (ref 0.0–0.1)
LYMPHS ABS: 1.5 10*3/uL (ref 0.7–3.1)
Lymphs: 25 %
MCH: 30.1 pg (ref 26.6–33.0)
MCHC: 32.7 g/dL (ref 31.5–35.7)
MCV: 92 fL (ref 79–97)
MONOCYTES: 8 %
Monocytes Absolute: 0.5 10*3/uL (ref 0.1–0.9)
NEUTROS PCT: 64 %
Neutrophils Absolute: 4 10*3/uL (ref 1.4–7.0)
PLATELETS: 208 10*3/uL (ref 150–379)
RBC: 4.95 x10E6/uL (ref 3.77–5.28)
RDW: 14.2 % (ref 12.3–15.4)
WBC: 6.2 10*3/uL (ref 3.4–10.8)

## 2016-07-22 LAB — COMPREHENSIVE METABOLIC PANEL
ALK PHOS: 83 IU/L (ref 39–117)
ALT: 20 IU/L (ref 0–32)
AST: 21 IU/L (ref 0–40)
Albumin/Globulin Ratio: 1.9 (ref 1.2–2.2)
Albumin: 4.5 g/dL (ref 3.5–5.5)
BILIRUBIN TOTAL: 0.3 mg/dL (ref 0.0–1.2)
BUN / CREAT RATIO: 17 (ref 9–23)
BUN: 16 mg/dL (ref 6–24)
CHLORIDE: 100 mmol/L (ref 96–106)
CO2: 19 mmol/L (ref 18–29)
Calcium: 9.6 mg/dL (ref 8.7–10.2)
Creatinine, Ser: 0.93 mg/dL (ref 0.57–1.00)
GFR calc non Af Amer: 71 mL/min/{1.73_m2} (ref >59)
GFR, EST AFRICAN AMERICAN: 82 mL/min/{1.73_m2} (ref >59)
GLUCOSE: 95 mg/dL (ref 65–99)
Globulin, Total: 2.4 g/dL (ref 1.5–4.5)
Potassium: 4.5 mmol/L (ref 3.5–5.2)
Sodium: 138 mmol/L (ref 134–144)
TOTAL PROTEIN: 6.9 g/dL (ref 6.0–8.5)

## 2016-07-22 LAB — LIPID PANEL
CHOL/HDL RATIO: 4 ratio (ref 0.0–4.4)
Cholesterol, Total: 191 mg/dL (ref 100–199)
HDL: 48 mg/dL (ref >39)
LDL Calculated: 117 mg/dL — ABNORMAL HIGH (ref 0–99)
Triglycerides: 128 mg/dL (ref 0–149)
VLDL CHOLESTEROL CAL: 26 mg/dL (ref 5–40)

## 2016-07-22 LAB — TSH

## 2016-07-22 LAB — HEMOGLOBIN A1C
Est. average glucose Bld gHb Est-mCnc: 117 mg/dL
HEMOGLOBIN A1C: 5.7 % — AB (ref 4.8–5.6)

## 2016-07-23 ENCOUNTER — Telehealth: Payer: Self-pay

## 2016-07-23 NOTE — Telephone Encounter (Signed)
-----   Message from Mar Daring, PA-C sent at 07/22/2016  4:14 PM EDT ----- So labs finally came back...sorry it took so long. They are actually looking really good and have improved from labs last year. Cholesterol is better, A1c dropped from 6.0 to 5.7. Vit D is normal. Continue the good work with healthy lifestyle modifications. TSH was cancelled unfortunately. I am not sure why. It says on the result [facility] will contact you to recollect, but essentially you should just need to stop by a [facility] draw center to have redrawn. The order should still be available.

## 2016-07-23 NOTE — Telephone Encounter (Signed)
Patient was advised. KW 

## 2016-08-13 ENCOUNTER — Ambulatory Visit: Payer: Managed Care, Other (non HMO) | Admitting: Hematology and Oncology

## 2016-08-14 ENCOUNTER — Telehealth: Payer: Self-pay

## 2016-08-14 ENCOUNTER — Telehealth: Payer: Self-pay | Admitting: Physician Assistant

## 2016-08-14 MED ORDER — CYCLOBENZAPRINE HCL 5 MG PO TABS: 5.0000 mg | ORAL_TABLET | Freq: Three times a day (TID) | ORAL | 1 refills | Status: DC | PRN

## 2016-08-14 MED ORDER — BUPROPION HCL ER (SR) 150 MG PO TB12: 150.0000 mg | ORAL_TABLET | Freq: Two times a day (BID) | ORAL | 1 refills | Status: DC

## 2016-08-14 NOTE — Telephone Encounter (Signed)
Refill Request Medications: Bupropion SR 150 mg Cyclobenzaprine.  Pharmacy; [facility].  Thanks,  -Kemond Amorin

## 2016-08-14 NOTE — Telephone Encounter (Signed)
Refills sent

## 2016-08-14 NOTE — Telephone Encounter (Signed)
Error/MW °

## 2016-08-20 ENCOUNTER — Encounter: Payer: Self-pay | Admitting: Physician Assistant

## 2016-08-20 DIAGNOSIS — E2740 Unspecified adrenocortical insufficiency: Secondary | ICD-10-CM

## 2016-08-20 DIAGNOSIS — E039 Hypothyroidism, unspecified: Secondary | ICD-10-CM

## 2016-08-26 NOTE — Telephone Encounter (Signed)
Patient just wanted to make sure we got her email. sd

## 2016-09-08 ENCOUNTER — Encounter: Payer: Self-pay | Admitting: Physician Assistant

## 2016-09-08 DIAGNOSIS — E2740 Unspecified adrenocortical insufficiency: Secondary | ICD-10-CM

## 2016-09-10 ENCOUNTER — Encounter: Payer: Self-pay | Admitting: Physician Assistant

## 2016-09-10 DIAGNOSIS — Z713 Dietary counseling and surveillance: Secondary | ICD-10-CM

## 2016-09-11 MED ORDER — PHENTERMINE HCL 37.5 MG PO TABS: 37.5000 mg | ORAL_TABLET | Freq: Every day | ORAL | 0 refills | Status: DC

## 2016-09-12 ENCOUNTER — Encounter: Payer: Self-pay | Admitting: Physician Assistant

## 2016-09-12 DIAGNOSIS — I890 Lymphedema, not elsewhere classified: Secondary | ICD-10-CM

## 2016-09-23 ENCOUNTER — Encounter: Payer: Self-pay | Admitting: Physician Assistant

## 2016-09-30 ENCOUNTER — Ambulatory Visit: Payer: BLUE CROSS/BLUE SHIELD | Admitting: Occupational Therapy

## 2016-10-03 ENCOUNTER — Ambulatory Visit: Payer: BLUE CROSS/BLUE SHIELD | Admitting: Physician Assistant

## 2016-10-07 ENCOUNTER — Ambulatory Visit (INDEPENDENT_AMBULATORY_CARE_PROVIDER_SITE_OTHER): Payer: BLUE CROSS/BLUE SHIELD | Admitting: Physician Assistant

## 2016-10-07 ENCOUNTER — Encounter: Payer: Self-pay | Admitting: Physician Assistant

## 2016-10-07 VITALS — BP 102/62 | Temp 98.3°F | Resp 16 | Wt 219.0 lb

## 2016-10-07 DIAGNOSIS — R3911 Hesitancy of micturition: Secondary | ICD-10-CM

## 2016-10-07 DIAGNOSIS — N3000 Acute cystitis without hematuria: Secondary | ICD-10-CM

## 2016-10-07 LAB — POCT URINALYSIS DIPSTICK
Bilirubin, UA: NEGATIVE
Glucose, UA: NEGATIVE
Ketones, UA: NEGATIVE
Nitrite, UA: NEGATIVE
PROTEIN UA: NEGATIVE
SPEC GRAV UA: 1.02 (ref 1.010–1.025)
UROBILINOGEN UA: 0.2 U/dL
pH, UA: 6.5 (ref 5.0–8.0)

## 2016-10-07 MED ORDER — NITROFURANTOIN MONOHYD MACRO 100 MG PO CAPS
100.0000 mg | ORAL_CAPSULE | Freq: Two times a day (BID) | ORAL | 0 refills | Status: DC
Start: 2016-10-07 — End: 2017-02-19

## 2016-10-07 NOTE — Progress Notes (Signed)
Patient: Sabrina Morris Female    DOB: June 16, 1965   51 y.o.   MRN: [id number] Visit Date: 10/07/2016  Today's Provider: Mar Daring, PA-C   Chief Complaint  Patient presents with  . Urinary Tract Infection   Subjective:    Urinary Tract Infection   This is a new problem. The current episode started in the past 7 days. The problem has been waxing and waning. The patient is experiencing no pain. There has been no fever. Associated symptoms include flank pain, hesitancy and urgency. Pertinent negatives include no chills, discharge, frequency, hematuria, nausea, sweats or vomiting. She has tried nothing for the symptoms.   Patient reports that she is currently taking Lasix 40mg  daily, and reports that if she does not take it she does not have to urinate as much. She reports she will go every 3-4 hours without lasix but feels she is not emptying completely. She does notice that she has lower back pain when she is not taking the lasix. She denies seeing any blood or has any pain on urination.     Allergies  Allergen Reactions  . Sumatriptan Other (See Comments) and Shortness Of Breath  . Prometrium [Progesterone Micronized] Other (See Comments)    dizziness  . Phentermine-Topiramate Other (See Comments)  . Sulfa Antibiotics Other (See Comments)    GI distress  . Sulfamethoxazole Other (See Comments)  . Tamoxifen Other (See Comments)    Edema, excess fluid  . Zoladex  [Goserelin]   . Hydrocodone-Acetaminophen Rash    Hyperactivity     Current Outpatient Prescriptions:  .  albuterol (PROVENTIL HFA;VENTOLIN HFA) 108 (90 Base) MCG/ACT inhaler, Inhale 1-2 puffs into the lungs every 6 (six) hours as needed for wheezing or shortness of breath., Disp: 1 Inhaler, Rfl: 3 .  AMBULATORY NON FORMULARY MEDICATION, Place 0.325 mg/mL onto the skin once. E2 estrogen cream Apply 0.4 ml transdermally nightly, Disp: , Rfl:  .  buPROPion (WELLBUTRIN SR) 150 MG 12 hr tablet, Take 1  tablet (150 mg total) by mouth 2 (two) times daily., Disp: 180 tablet, Rfl: 1 .  calcium carbonate 100 mg/ml SUSP, Take by mouth., Disp: , Rfl:  .  chlorpheniramine-HYDROcodone (TUSSIONEX PENNKINETIC ER) 10-8 MG/5ML SUER, Take 5 mLs by mouth every 12 (twelve) hours as needed for cough., Disp: 180 mL, Rfl: 0 .  Cholecalciferol (VITAMIN D3) 5000 units TABS, Take by mouth., Disp: , Rfl:  .  cyclobenzaprine (FLEXERIL) 5 MG tablet, Take 1 tablet (5 mg total) by mouth 3 (three) times daily as needed., Disp: 90 tablet, Rfl: 1 .  docusate sodium (STOOL SOFTENER) 100 MG capsule, Take by mouth., Disp: , Rfl:  .  fluocinonide (LIDEX) 0.05 % external solution, Apply 1 application topically 2 (two) times daily., Disp: 60 mL, Rfl: 3 .  furosemide (LASIX) 20 MG tablet, TAKE 2 TABLETS DAILY, Disp: 60 tablet, Rfl: 5 .  LORazepam (ATIVAN) 1 MG tablet, TAKE ONE TABLET TWICE A DAY AS NEEDED, Disp: 60 tablet, Rfl: 5 .  Multiple Vitamin (MULTIVITAMIN) capsule, Take by mouth., Disp: , Rfl:  .  oxyCODONE-acetaminophen (PERCOCET/ROXICET) 5-325 MG tablet, Take 1-2 tablets by mouth every 8 (eight) hours as needed., Disp: 30 tablet, Rfl: 0 .  phentermine (ADIPEX-P) 37.5 MG tablet, Take 1 tablet (37.5 mg total) by mouth daily before breakfast., Disp: 30 tablet, Rfl: 0 .  potassium chloride (K-DUR,KLOR-CON) 10 MEQ tablet, TAKE 1 TABLET EVERY DAY, Disp: 90 tablet, Rfl: 1 .  Progesterone 40 %  CREA, Place 40 mg/mL onto the skin once. Apply 0.4 ml by transdermal route once nightly, Disp: , Rfl:  .  rizatriptan (MAXALT) 10 MG tablet, TAKE ONE TABLET AT FIRST SIGN OF MIGRAINE SYMPTOMS--IF NO RELIEF, A SECOND TABLET MAY BE TAKEN IN 2 HOURS LIMIT 2/DAY 5/WEEK, Disp: 15 tablet, Rfl: 5 .  levofloxacin (LEVAQUIN) 500 MG tablet, Take 1 tablet (500 mg total) by mouth daily. (Patient not taking: Reported on 10/07/2016), Disp: 10 tablet, Rfl: 0 .  predniSONE (STERAPRED UNI-PAK 21 TAB) 10 MG (21) TBPK tablet, Take as directed on package  instructions (Patient not taking: Reported on 10/07/2016), Disp: 21 tablet, Rfl: 0  Review of Systems  Constitutional: Negative for chills and fever.  Respiratory: Negative.   Cardiovascular: Negative.   Gastrointestinal: Negative for nausea and vomiting.  Genitourinary: Positive for flank pain, hesitancy and urgency. Negative for frequency and hematuria.  Musculoskeletal: Positive for back pain (some low back pain).    Social History  Substance Use Topics  . Smoking status: Never Smoker  . Smokeless tobacco: Never Used  . Alcohol use No   Objective:   BP 102/62 (BP Location: Left Arm, Patient Position: Sitting, Cuff Size: Large)   Temp 98.3 F (36.8 C)   Resp 16   Wt 219 lb (99.3 kg)   BMI 36.44 kg/m  Vitals:   10/07/16 0859  BP: 102/62  Resp: 16  Temp: 98.3 F (36.8 C)  Weight: 219 lb (99.3 kg)     Physical Exam  Constitutional: She is oriented to person, place, and time. She appears well-developed and well-nourished. No distress.  Cardiovascular: Normal rate, regular rhythm and normal heart sounds.  Exam reveals no gallop and no friction rub.   No murmur heard. Pulmonary/Chest: Effort normal and breath sounds normal. No respiratory distress. She has no wheezes. She has no rales.  Abdominal: Soft. Normal appearance and bowel sounds are normal. She exhibits no distension and no mass. There is no hepatosplenomegaly. There is tenderness in the suprapubic area. There is no rebound, no guarding and no CVA tenderness.  Suprapubic pressure  Neurological: She is alert and oriented to person, place, and time.  Skin: Skin is warm and dry. She is not diaphoretic.  Vitals reviewed.      Assessment & Plan:     1. Acute cystitis without hematuria Worsening symptoms. UA positive for small leuks. Will treat empirically with Macrobid as below. Continue to push fluids. Urine sent for culture. Will follow up pending C&S results. She is to call if symptoms do not improve or if they  worsen.  - nitrofurantoin, macrocrystal-monohydrate, (MACROBID) 100 MG capsule; Take 1 capsule (100 mg total) by mouth 2 (two) times daily.  Dispense: 20 capsule; Refill: 0  2. Urinary hesitancy Will check renal function panel as below due to patient's concerns of urinary hesitancy when she is not taking the lasix 40mg . She even reports symptoms are present when she takes the lasix 20mg . I suspect that she has been on the lasix for so long (since 2014 or 2015) that when she returns to normal voiding without medication that she is not used to how it feels to urinate without the medication. She is very in tune with her body and I feel this makes her more sensitive with even something like this. Advised she may consider trying lasix 20mg  for a longer period to see if her body adjust, then decrease to not taking for more than a day or too to see if she  adjust with this as well. She does report she is drinking 5-6 bottles of water daily and 6-8 oz of tea daily. Without lasix she voids every 3-4 hours. I advised her this would be a normal voiding pattern. Reassured and advised if renal panel is normal then most likely would be secondary to the medication. She has had renal workup with Dr. Holley Raring, Nephrology, in 2015 which was normal.  - Urine Culture - POCT urinalysis dipstick - Renal Function Panel       Mar Daring, PA-C  [place]

## 2016-10-07 NOTE — Patient Instructions (Signed)
Nitrofurantoin tablets or capsules What is this medicine? NITROFURANTOIN (nye troe fyoor AN toyn) is an antibiotic. It is used to treat urinary tract infections. This medicine may be used for other purposes; ask your health care provider or pharmacist if you have questions. COMMON BRAND NAME(S): Macrobid, Macrodantin, Urotoin What should I tell my health care provider before I take this medicine? They need to know if you have any of these conditions: -anemia -diabetes -glucose-6-phosphate dehydrogenase deficiency -kidney disease -liver disease -lung disease -other chronic illness -an unusual or allergic reaction to nitrofurantoin, other antibiotics, other medicines, foods, dyes or preservatives -pregnant or trying to get pregnant -breast-feeding How should I use this medicine? Take this medicine by mouth with a glass of water. Follow the directions on the prescription label. Take this medicine with food or milk. Take your doses at regular intervals. Do not take your medicine more often than directed. Do not stop taking except on your doctor's advice. Talk to your pediatrician regarding the use of this medicine in children. While this drug may be prescribed for selected conditions, precautions do apply. Overdosage: If you think you have taken too much of this medicine contact a poison control center or emergency room at once. NOTE: This medicine is only for you. Do not share this medicine with others. What if I miss a dose? If you miss a dose, take it as soon as you can. If it is almost time for your next dose, take only that dose. Do not take double or extra doses. What may interact with this medicine? -antacids containing magnesium trisilicate -probenecid -quinolone antibiotics like ciprofloxacin, lomefloxacin, norfloxacin and ofloxacin -sulfinpyrazone This list may not describe all possible interactions. Give your health care provider a list of all the medicines, herbs,  non-prescription drugs, or dietary supplements you use. Also tell them if you smoke, drink alcohol, or use illegal drugs. Some items may interact with your medicine. What should I watch for while using this medicine? Tell your doctor or health care professional if your symptoms do not improve or if you get new symptoms. Drink several glasses of water a day. If you are taking this medicine for a long time, visit your doctor for regular checks on your progress. If you are diabetic, you may get a false positive result for sugar in your urine with certain brands of urine tests. Check with your doctor. What side effects may I notice from receiving this medicine? Side effects that you should report to your doctor or health care professional as soon as possible: -allergic reactions like skin rash or hives, swelling of the face, lips, or tongue -chest pain -cough -difficulty breathing -dizziness, drowsiness -fever or infection -joint aches or pains -pale or blue-tinted skin -redness, blistering, peeling or loosening of the skin, including inside the mouth -tingling, burning, pain, or numbness in hands or feet -unusual bleeding or bruising -unusually weak or tired -yellowing of eyes or skin Side effects that usually do not require medical attention (report to your doctor or health care professional if they continue or are bothersome): -dark urine -diarrhea -headache -loss of appetite -nausea or vomiting -temporary hair loss This list may not describe all possible side effects. Call your doctor for medical advice about side effects. You may report side effects to FDA at 1-800-FDA-1088. Where should I keep my medicine? Keep out of the reach of children. Store at room temperature between 15 and 30 degrees C (59 and 86 degrees F). Protect from light. Throw away any unused  medicine after the expiration date. NOTE: This sheet is a summary. It may not cover all possible information. If you have  questions about this medicine, talk to your doctor, pharmacist, or health care provider.  2018 Elsevier/Gold Standard (2007-09-22 15:56:47)  

## 2016-10-09 LAB — URINE CULTURE

## 2016-10-28 ENCOUNTER — Other Ambulatory Visit: Payer: Self-pay | Admitting: Physician Assistant

## 2016-10-28 DIAGNOSIS — M5030 Other cervical disc degeneration, unspecified cervical region: Secondary | ICD-10-CM

## 2016-10-29 ENCOUNTER — Other Ambulatory Visit: Payer: Self-pay | Admitting: Physician Assistant

## 2016-10-29 NOTE — Telephone Encounter (Signed)
Patient advised that prescription is ready for pick-up. Patient is also requesting Phenergan to be sent to [place]. She reports that with all the stress she is having at this moment with her sister cancer that her migraines are worse and they are causing nausea and vomiting.  Thanks,  -Amadeo Coke

## 2016-10-29 NOTE — Telephone Encounter (Signed)
NCCSR reviewed. 

## 2016-11-07 ENCOUNTER — Telehealth: Payer: Self-pay

## 2016-11-07 ENCOUNTER — Other Ambulatory Visit: Payer: Self-pay | Admitting: Physician Assistant

## 2016-11-07 DIAGNOSIS — G43109 Migraine with aura, not intractable, without status migrainosus: Secondary | ICD-10-CM

## 2016-11-07 MED ORDER — PROMETHAZINE HCL 25 MG PO TABS: 25.0000 mg | ORAL_TABLET | Freq: Three times a day (TID) | ORAL | 0 refills | Status: DC | PRN

## 2016-11-07 NOTE — Telephone Encounter (Signed)
Patient came in and asked if Phenergan had been sent to the pharmacy when requested 08/15 ?  She is requesting this prescription to be send to [place] Please.  Thanks,  -Joseline

## 2016-11-07 NOTE — Telephone Encounter (Signed)
Sent today

## 2016-12-19 ENCOUNTER — Encounter: Payer: Self-pay | Admitting: Physician Assistant

## 2016-12-31 ENCOUNTER — Other Ambulatory Visit: Payer: Self-pay | Admitting: Physician Assistant

## 2016-12-31 ENCOUNTER — Encounter: Payer: Self-pay | Admitting: Physician Assistant

## 2016-12-31 DIAGNOSIS — R634 Abnormal weight loss: Secondary | ICD-10-CM

## 2016-12-31 DIAGNOSIS — M5030 Other cervical disc degeneration, unspecified cervical region: Secondary | ICD-10-CM

## 2017-01-01 MED ORDER — OXYCODONE-ACETAMINOPHEN 5-325 MG PO TABS: 1.0000 | ORAL_TABLET | Freq: Four times a day (QID) | ORAL | 0 refills | Status: DC | PRN

## 2017-01-01 MED ORDER — PHENTERMINE HCL 15 MG PO CAPS: 15.0000 mg | ORAL_CAPSULE | ORAL | 0 refills | Status: DC

## 2017-01-07 ENCOUNTER — Other Ambulatory Visit: Payer: Self-pay | Admitting: Physician Assistant

## 2017-01-07 DIAGNOSIS — F419 Anxiety disorder, unspecified: Secondary | ICD-10-CM

## 2017-01-08 ENCOUNTER — Other Ambulatory Visit: Payer: Self-pay | Admitting: Physician Assistant

## 2017-01-08 DIAGNOSIS — F419 Anxiety disorder, unspecified: Secondary | ICD-10-CM

## 2017-01-08 NOTE — Telephone Encounter (Signed)
Prescription called in to [facility].Spoke to Pharmacist.  Thanks,  -Zamar Odwyer

## 2017-01-23 ENCOUNTER — Encounter: Payer: Self-pay | Admitting: Physician Assistant

## 2017-01-23 DIAGNOSIS — I890 Lymphedema, not elsewhere classified: Secondary | ICD-10-CM

## 2017-01-23 MED ORDER — MELOXICAM 7.5 MG PO TABS: 7.5000 mg | ORAL_TABLET | Freq: Every day | ORAL | 3 refills | Status: DC

## 2017-02-03 ENCOUNTER — Other Ambulatory Visit: Payer: Self-pay | Admitting: Physician Assistant

## 2017-02-18 HISTORY — PX: OTHER SURGICAL HISTORY: SHX169

## 2017-02-18 NOTE — Progress Notes (Signed)
Cardiology Office Note  Date:  02/19/2017   ID:  Sabrina Morris, DOB 11-29-1965, MRN [id number]  PCP:  Mar Daring, PA-C   Chief Complaint  Patient presents with  . Other    New patient.  Patient c/o fluid retention. Seen Dr Sabrina Morris in past. Patient wants a second opinion.    HPI:  Sabrina Morris is a 51 year old woman with history of Wolff-Parkinson-White syndrome, catheter ablation 1994 exertional dyspnea  bilateral lymphedema breast cancer, status post mastectomy,  high dose chemotherapy followed by Dr. Juanita Morris at [facility].  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   Previously on Lasix 20 mg every other day, up to 20 mg daily with only modest improvement Wears compression hose periodically, lymphedema compression pumps  06/13/2014  furosemide was increased to 40 mg daily.  No significant change in weight   Previously seen by cardiology in 2016 stress echocardiogram on 06/30/2014.  The patient exercise 9 minutes 11 seconds on a Bruce protocol without chest pain, ECG changes, or echocardiographic evidence for ischemia. Mild mitral regurgitation was observed.  Reports having "swelling head to toe"  CT chest  10/2015 reviewed with her in detail No CAD, no PAD There was filling defect left subclavian vein  had the port on the left removed August 2017  On review of previous surgical history, she reports no lymph nodes taken out on the left axilla She did have lymph nodes taken out right axilla  Recently had breast implant on the left taken out to make sure this is not contributing to lymphedema  EKG personally reviewed by myself on todays visit Shows normal sinus rhythm rate 86 bpm no significant ST or T wave  changes   PMH:   has a past medical history of Adrenal disorder ([place]), Breast cancer ([place]), H/O bone density study (2015), H/O colonoscopy, Lymphedema, Lymphedema, Migraine, and Pap smear for cervical cancer screening ([id number]).  PSH:    Past Surgical History:  Procedure Laterality Date  . BREAST ENHANCEMENT SURGERY Bilateral 2005  . CARDIAC CATHETERIZATION  1994  . CARDIAC CATHETERIZATION  1994  . MASTECTOMY Right 02/2011  . PORT A CATH INJECTION ([place] [facility])  2013    Current Outpatient Medications  Medication Sig Dispense Refill  . albuterol (PROVENTIL HFA;VENTOLIN HFA) 108 (90 Base) MCG/ACT inhaler Inhale 1-2 puffs into the lungs every 6 (six) hours as needed for wheezing or shortness of breath. 1 Inhaler 3  . AMBULATORY NON FORMULARY MEDICATION Place 0.325 mg/mL onto the skin once. E2 estrogen cream Apply 0.4 ml transdermally nightly    . calcium carbonate 100 mg/ml SUSP Take by mouth.    . chlorpheniramine-HYDROcodone (TUSSIONEX PENNKINETIC ER) 10-8 MG/5ML SUER Take 5 mLs by mouth every 12 (twelve) hours as needed for cough. 180 mL 0  . Cholecalciferol (VITAMIN D3) 5000 units TABS Take by mouth.    . cyclobenzaprine (FLEXERIL) 5 MG tablet Take 1 tablet (5 mg total) by mouth 3 (three) times daily as needed. 90 tablet 1  . docusate sodium (STOOL SOFTENER) 100 MG capsule Take by mouth.    . escitalopram (LEXAPRO) 20 MG tablet TAKE 1/2 TABLET BY MOUTH DAILY 30 tablet 11  . fluocinonide (LIDEX) 0.05 % external solution Apply 1 application topically 2 (two) times daily. 60 mL 3  . furosemide (  LASIX) 20 MG tablet TAKE 2 TABLETS DAILY 60 tablet 5  . LORazepam (ATIVAN) 1 MG tablet TAKE 1 TABLET BY MOUTH TWICE A DAY AS NEEDED. 60 tablet 5  . meloxicam (MOBIC) 7.5 MG tablet Take 1 tablet (7.5 mg total) daily by mouth. 30 tablet 3  . Multiple Vitamin (MULTIVITAMIN) capsule Take by mouth.    . oxyCODONE-acetaminophen (PERCOCET/ROXICET) 5-325 MG tablet Take 1-2 tablets by mouth every 6 (six)  hours as needed for severe pain. 30 tablet 0  . phentermine 15 MG capsule Take 1-2 capsules (15-30 mg total) by mouth every morning. 60 capsule 0  . potassium chloride (K-DUR,KLOR-CON) 10 MEQ tablet TAKE 1 TABLET EVERY DAY 90 tablet 1  . Progesterone 40 % CREA Place 40 mg/mL onto the skin once. Apply 0.4 ml by transdermal route once nightly    . promethazine (PHENERGAN) 25 MG tablet Take 1 tablet (25 mg total) by mouth every 8 (eight) hours as needed for nausea or vomiting. 20 tablet 0  . rizatriptan (MAXALT) 10 MG tablet TAKE 1 TABLET AT FIRST SIGN OF MIGRAINE SYMPTOMS. IF NO RELIEF, A SECOND TABLET MAY BE TAKEN IN 2 HOURS. LIMIT 2 TABLETSPER DAY AND 5 TABLET 15 tablet 3   No current facility-administered medications for this visit.      Allergies:   Sumatriptan; Prometrium [progesterone micronized]; Phentermine-topiramate; Sulfa antibiotics; Sulfamethoxazole; Tamoxifen; Zoladex  [goserelin]; and Hydrocodone-acetaminophen   Social History:  The patient  reports that  has never smoked. she has never used smokeless tobacco. She reports that she does not drink alcohol or use drugs.   Family History:   family history includes Breast cancer (age of onset: 19) in her sister. She was adopted.    Review of Systems: Review of Systems  Constitutional: Negative.        Facial swelling, bilateral arm swelling  Respiratory: Negative.   Cardiovascular: Positive for leg swelling.  Gastrointestinal: Negative.   Musculoskeletal: Negative.   Neurological: Negative.   Psychiatric/Behavioral: Negative.   All other systems reviewed and are negative.    PHYSICAL EXAM: VS:  BP 120/74 (BP Location: Left Arm, Patient Position: Sitting, Cuff Size: Normal)   Pulse 86   Ht 5\' 5"  (1.651 m)   Wt 222 lb 8 oz (100.9 kg)   BMI 37.03 kg/m  , BMI Body mass index is 37.03 kg/m. GEN: Well nourished, well developed, in no acute distress, obese HEENT: normal  Neck: no JVD, carotid bruits, or masses Cardiac:  RRR; no murmurs, rubs, or gallops,no edema  Respiratory:  clear to auscultation bilaterally, normal work of breathing GI: soft, nontender, nondistended, + BS MS: no deformity or atrophy  Skin: warm and dry, no rash Neuro:  Strength and sensation are intact Psych: euthymic mood, full affect    Recent Labs: 07/14/2016: ALT 20; BUN 16; Creatinine, Ser 0.93; Hemoglobin 14.9; Platelets 208; Potassium 4.5; Sodium 138; TSH CANCELED    Lipid Panel Lab Results  Component Value Date   CHOL 191 07/14/2016   HDL 48 07/14/2016   LDLCALC 117 (H) 07/14/2016   TRIG 128 07/14/2016      Wt Readings from Last 3 Encounters:  02/19/17 222 lb 8 oz (100.9 kg)  10/07/16 219 lb (99.3 kg)  05/29/16 215 lb (97.5 kg)       ASSESSMENT AND PLAN:  Lymphedema of face - Plan: EKG 12-Lead Etiology unclear, will order subclavian venous duplex to rule out stenosis, particularly at the site of the port on the left there was  a filling defect noted on previous CT scan felt secondary to.  Mixing of noncontrast blood and contrast.  Recommended ultrasound of that area  Lymphedema of both lower extremities - Plan: EKG 12-Lead As above, ultrasound of subclavian veins to rule out stenosis particularly on the left at site of previous chemotherapy port  Hypercholesteremia - Plan: EKG 12-Lead Recommended continued weight loss, exercise, diet modification  Abnormal weight gain - Plan: EKG 12-Lead Started after various chemotherapy medications Recommended low carbohydrate diet continue walking on the track  Edema, unspecified type - Plan: ECHOCARDIOGRAM COMPLETE  Fluid retention - Plan: ECHOCARDIOGRAM COMPLETE Ordered echocardiogram to eliminate pulmonary hypertension  Disposition:   F/U as needed   Total encounter time more than 60 minutes  Greater than 50% was spent in counseling and coordination of care with the patient  Patient was seen in consultation for [facility] and will be referred back to her  office for ongoing care of the issues detailed above   Orders Placed This Encounter  Procedures  . EKG 12-Lead  . ECHOCARDIOGRAM COMPLETE     Signed, Esmond Plants, M.D., Ph.D. 02/19/2017  [place], [place]

## 2017-02-19 ENCOUNTER — Encounter: Payer: Self-pay | Admitting: Cardiovascular Disease

## 2017-02-19 ENCOUNTER — Other Ambulatory Visit: Payer: Self-pay | Admitting: Cardiovascular Disease

## 2017-02-19 ENCOUNTER — Ambulatory Visit: Payer: BLUE CROSS/BLUE SHIELD | Admitting: Cardiovascular Disease

## 2017-02-19 VITALS — BP 120/74 | HR 86 | Ht 65.0 in | Wt 222.5 lb

## 2017-02-19 DIAGNOSIS — I890 Lymphedema, not elsewhere classified: Secondary | ICD-10-CM

## 2017-02-19 DIAGNOSIS — R609 Edema, unspecified: Secondary | ICD-10-CM

## 2017-02-19 DIAGNOSIS — R635 Abnormal weight gain: Secondary | ICD-10-CM | POA: Diagnosis not present

## 2017-02-19 DIAGNOSIS — E7800 Pure hypercholesterolemia, unspecified: Secondary | ICD-10-CM | POA: Diagnosis not present

## 2017-02-19 DIAGNOSIS — R220 Localized swelling, mass and lump, head: Secondary | ICD-10-CM

## 2017-02-19 DIAGNOSIS — M7989 Other specified soft tissue disorders: Secondary | ICD-10-CM

## 2017-02-19 NOTE — Patient Instructions (Addendum)
Medication Instructions:   No medication changes made  Labwork:  No new labs needed  Testing/Procedures:  We will order an echocardiogram for fluid retention, edema 'we will also order a left subclavian vein doppler/duplex   Follow-Up: It was a pleasure seeing you in the office today. Please call us if you have new issues that need to be addressed before your next appt.  [phone]  Your physician wants you to follow-up in: As needed  If you need a refill on your cardiac medications before your next appointment, please call your pharmacy.

## 2017-02-20 ENCOUNTER — Other Ambulatory Visit: Payer: Self-pay

## 2017-02-20 ENCOUNTER — Other Ambulatory Visit: Payer: Self-pay | Admitting: Physician Assistant

## 2017-02-20 DIAGNOSIS — I890 Lymphedema, not elsewhere classified: Secondary | ICD-10-CM

## 2017-03-05 ENCOUNTER — Other Ambulatory Visit: Payer: Self-pay

## 2017-03-05 ENCOUNTER — Ambulatory Visit (INDEPENDENT_AMBULATORY_CARE_PROVIDER_SITE_OTHER): Payer: BLUE CROSS/BLUE SHIELD

## 2017-03-05 DIAGNOSIS — R220 Localized swelling, mass and lump, head: Secondary | ICD-10-CM | POA: Diagnosis not present

## 2017-03-05 DIAGNOSIS — I890 Lymphedema, not elsewhere classified: Secondary | ICD-10-CM

## 2017-03-05 DIAGNOSIS — R635 Abnormal weight gain: Secondary | ICD-10-CM

## 2017-03-05 DIAGNOSIS — M7989 Other specified soft tissue disorders: Secondary | ICD-10-CM

## 2017-03-05 DIAGNOSIS — E7800 Pure hypercholesterolemia, unspecified: Secondary | ICD-10-CM | POA: Diagnosis not present

## 2017-03-05 DIAGNOSIS — R609 Edema, unspecified: Secondary | ICD-10-CM | POA: Diagnosis not present

## 2017-03-06 ENCOUNTER — Encounter: Payer: Self-pay | Admitting: Physician Assistant

## 2017-03-11 ENCOUNTER — Encounter: Payer: Self-pay | Admitting: Physician Assistant

## 2017-03-11 DIAGNOSIS — I890 Lymphedema, not elsewhere classified: Secondary | ICD-10-CM

## 2017-03-11 MED ORDER — IBUPROFEN 600 MG PO TABS
600.0000 mg | ORAL_TABLET | Freq: Three times a day (TID) | ORAL | 1 refills | Status: DC | PRN
Start: 2017-03-11 — End: 2018-08-30

## 2017-03-24 ENCOUNTER — Ambulatory Visit (INDEPENDENT_AMBULATORY_CARE_PROVIDER_SITE_OTHER): Payer: BLUE CROSS/BLUE SHIELD | Admitting: Psychology

## 2017-03-24 DIAGNOSIS — F330 Major depressive disorder, recurrent, mild: Secondary | ICD-10-CM

## 2017-04-03 ENCOUNTER — Encounter: Payer: Self-pay | Admitting: Physician Assistant

## 2017-04-03 DIAGNOSIS — I890 Lymphedema, not elsewhere classified: Secondary | ICD-10-CM

## 2017-04-07 ENCOUNTER — Ambulatory Visit: Payer: BLUE CROSS/BLUE SHIELD | Admitting: Occupational Therapy

## 2017-04-14 ENCOUNTER — Ambulatory Visit: Payer: BLUE CROSS/BLUE SHIELD | Admitting: Occupational Therapy

## 2017-04-20 ENCOUNTER — Encounter: Payer: Self-pay | Admitting: Hematology and Oncology

## 2017-04-21 ENCOUNTER — Ambulatory Visit: Payer: BLUE CROSS/BLUE SHIELD | Admitting: Hematology and Oncology

## 2017-04-21 ENCOUNTER — Ambulatory Visit: Payer: BLUE CROSS/BLUE SHIELD | Attending: Physician Assistant | Admitting: Occupational Therapy

## 2017-04-21 ENCOUNTER — Other Ambulatory Visit: Payer: Self-pay

## 2017-04-21 DIAGNOSIS — I972 Postmastectomy lymphedema syndrome: Secondary | ICD-10-CM | POA: Diagnosis present

## 2017-04-21 DIAGNOSIS — I890 Lymphedema, not elsewhere classified: Secondary | ICD-10-CM | POA: Diagnosis present

## 2017-04-21 NOTE — Assessment & Plan Note (Deleted)
Right breast cancer T3 N1 M0 stage IIIa invasive ductal carcinoma ER/PR positive HER-2 negative status post mastectomy December 2012, followed by 6 cycles of TAC completed June 2013 followed by radiation July 2013, started tamoxifen September 2013, started Zoladex January 2014, discontinued all antiestrogen therapy January 2015 (because she attributed tamoxifen to anasarca)  Lymphedema management: Patient undergoes manual lymphedema drainage procedures every day as well as sleep and lymphedema pumps to help with her lymphedema issues. Current treatment of swelling: Lasix 20 by mouth twice a day, phentermine, Adipex  Menopausal symptoms: Patient is currently receivingestrogen replacement therapy for the menopausal symptoms. She is fully aware that she is risking breast cancer relapse by taking estrogen replacement therapy.  Breast Cancer Surveillance: 1. Breast exam 04/21/2017: Right mastectomy, no palpable abnormalities, lymphedema 2. Mammogram and ultrasound left breast: 07/14/2016: No findings suspicious for malignancy. Breast density category B  Neck and face swelling: CT neck 11/01/2015: No evidence of venous thrombosis no acute process in the neck  Return to clinic in 1 year for surveillance and follow-up

## 2017-04-21 NOTE — Patient Instructions (Signed)
Pt to switch her breast pad jovipak to L breast every other day - to decrease thoracic lymphedema - or edema from her surgery about 6 -8 wks ago - L upper arm increase by 2 cm  R UE wear her daytime compression sleeve with actitiiveis And do Flexitouch pump on bilateral UE   Then - on days if she is on her legs a lot - wear daytime compression sleeves for bilateral LE  And cont to do her Flexitouch pump for bilateral LE   And she wants to get her hormones levels better  And will remeasure in month again

## 2017-04-21 NOTE — Therapy (Signed)
[facility] [address], [place], [address] Phone: [phone]   Fax:  [phone]  Occupational Therapy Evaluation  Patient Details  Name: Sabrina Morris MRN: [id number] Date of Birth: 06-Jul-1965 Referring Provider: Fenton Malling   Encounter Date: 04/21/2017  OT End of Session - 04/21/17 1853    Visit Number  1    Number of Visits  3    Date for OT Re-Evaluation  07/14/17    OT Start Time  1115    OT Stop Time  1216    OT Time Calculation (min)  61 min    Activity Tolerance  Patient tolerated treatment well    Behavior During Therapy  [facility] for tasks assessed/performed       Past Medical History:  Diagnosis Date  . Adrenal disorder ([place])   . Breast cancer ([place])   . H/O bone density study 2015  . H/O colonoscopy    sch 06/12/15  . Lymphedema   . Lymphedema   . Migraine   . Pap smear for cervical cancer screening [id number]    Past Surgical History:  Procedure Laterality Date  . BREAST ENHANCEMENT SURGERY Bilateral 2005  . CARDIAC CATHETERIZATION  1994  . CARDIAC CATHETERIZATION  1994  . MASTECTOMY Right 02/2011  . PORT A CATH INJECTION ([place] HX)  2013    There were no vitals filed for this visit.  Subjective Assessment - 04/21/17 1833    Subjective   I cannot remember when I seen you the last time - but I need remeasuring to see where we are - all my hormons levels are bad- it will take probably month to get straight - my blookwork did not look good- I  had L breast implant removed in Dec 18 - and then my L hand and arm swell up , I cannot get my Reidsleeve headpiece around my neck - my neck is swollen -and my arm sleeve I did get one with ease - but do not wear it a lot - thought doing better without it , bilateral LE I wear when I am up on my feet a lot - but otherwise not weraing - I do my pump every day - alternating legs and then my R arm every am with leg and my  head and neck - I did  get sleeve for my pump for my L arm -     Patient Stated Goals  I just  want to have you check again my measurements and compare to last time and get this lymphedema back under control - and my hormones     Currently in Pain?  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 - now on disability - helping her sister and family - she also had  breast CA and has lymphedema        LYMPHEDEMA/ONCOLOGY QUESTIONNAIRE - 04/21/17 1143      Right Upper Extremity Lymphedema   15 cm Proximal to Olecranon Process  38.8 cm    10 cm Proximal to Olecranon Process  36.6 cm    Olecranon Process  30.5 cm    15 cm Proximal to Ulnar Styloid Process  28.2 cm    10 cm Proximal to Ulnar Styloid Process  24.5 cm    Just Proximal to Ulnar Styloid Process  17.5 cm    Across Hand at [employer]  19.6 cm    At [place] of 2nd Digit  6.8 cm    At [facility] of Thumb  6.6 cm      Left Upper Extremity Lymphedema   15 cm Proximal to Olecranon Process  34.5 cm    10 cm Proximal to Olecranon Process  32.5 cm    Olecranon Process  28 cm    15 cm Proximal to Ulnar Styloid Process  26.8 cm    10 cm Proximal to Ulnar Styloid Process  23 cm    Just Proximal to Ulnar Styloid Process  17.5 cm    Across Hand at [employer]  19.5 cm    At [place] of 2nd Digit  7.2 cm    At [facility] of Thumb  7.1 cm      Right Lower Extremity Lymphedema   20 cm Proximal to Suprapatella  68.5 cm    10 cm Proximal to Suprapatella  53 cm    At Midpatella/Popliteal Crease  39 cm    30 cm  Proximal to Floor at Lateral Plantar Foot  42.5 cm    10 cm Proximal to Floor at Lateral Malleoli  25 cm    Circumference of ankle/heel  30 cm.    5 cm Proximal to 1st MTP Joint  22 cm    Across MTP Joint  22.2 cm    Around Proximal Great Toe  8.2 cm      Left Lower Extremity Lymphedema   20 cm Proximal to Suprapatella  68 cm    10 cm Proximal to Suprapatella  54 cm    At Midpatella/Popliteal Crease  39.4 cm    30 cm Proximal to Floor at Lateral Plantar Foot  42.7 cm    10 cm Proximal to Floor at Lateral Malleoli  23.8 cm    Circumference of ankle/heel  30 cm.    5 cm Proximal to 1st MTP Joint  22 cm    Across MTP Joint  21.7 cm    Around Proximal Great Toe  8 cm         Facial measurements - pt  Increase in measurement from ear to ear under chin -24 cm  But ear to corner of mouth - same 10 cm each side Ear to corner of nose - 12 cm on R , L 11 cm   and circumference for neck 37 cm    Pt was seen this date - year ago -and all measurements were compare to that - Do have measurements from 2017 on file             OT Education - 04/21/17 1853    Education provided  Yes    Education Details  HEP for pump use and garments - discuss findings of eval - and increase measurements - what she was doing and changes     Person(s)  Educated  Patient    Methods  Explanation;Demonstration;Tactile cues;Verbal cues    Comprehension  Returned demonstration;Verbalized understanding          OT Long Term Goals - 04/21/17 1930      OT LONG TERM GOAL #1   Title  Pt to be ind in updated home program  to keep lymphedema under control     Baseline  pt increase in bilateral LE and UE - face measurements stayed same - did take circumference for neck     Time  12    Period  Weeks    Status  New            Plan - 04/21/17 1920    Clinical Impression Statement  Pt arrive this date with no daytime garments on - pt report that she is getting full MLD every other week -and dong  Flexitouch pump every am for about 3 hrs - doing her R UE and alternating  LE - she also started back doing head  and neck  flexitouch - Measurement for her face was same than year ago - did do neck circumference this date - she has daytime garments that is older than year - wearing LE only when standing the day - and R UE not wearing - she some nights wore jovipak breast pad for R side - pt to alternate to L - because of surgery in Dec 18 and L UE increase cirumference upper arm 2 cm increase - forearm and distal .5 cm - pt  R UE increase .5 in forearm but elbow and upper arm increase 2.1 to 3.3CM - and R LE increase 1.5 at calf, but thigh increase 3-5.5cm ; and L LE increase 2.2 cm at calf but thigh increase 3.7 to 4.6 cm - pt to wear her daytime garments more when increase act - use flexitouch pump - pt report they are working on getting her numbers for hormones better - did discuss with pt healty eating - anti inflammatory diet to be checked out maybe - pt to do homeprogram for month and get hormones improved maybe and will remeasure - pt in agreement and will phone closer to time     Occupational performance deficits (Please refer to evaluation for details):  Work;Play;Leisure;ADL's    Rehab Potential  Fair    OT Frequency  Monthly    OT Duration  12 weeks    OT Treatment/Interventions  Self-care/ADL training;Compression bandaging;Patient/family education;DME and/or AE instruction;Manual lymph drainage    Plan  Reassess in month - if improve in measurements     OT Home Exercise Plan  see pt instruction    Consulted and Agree with Plan of Care  Patient       Patient will benefit from skilled therapeutic intervention in order to improve the following deficits and impairments:  Increased edema, Decreased activity tolerance  Visit Diagnosis: Lymphedema of both lower extremities - Plan: Ot plan of care cert/re-cert  Post-mastectomy lymphedema syndrome - Plan: Ot plan of care  cert/re-cert  Lymphedema, not elsewhere classified - Plan: Ot plan of care cert/re-cert    Problem List Patient Active Problem List   Diagnosis Date Noted  . Postmenopausal 07/02/2015  . Lymphedema of face 06/27/2015  . Hypokalemia 11/30/2014  . Accumulation of fluid in tissues 10/03/2014  . Hypercholesteremia 10/03/2014  . Acquired lymphedema 10/03/2014  . Menopausal and perimenopausal disorder 10/03/2014  . Borderline diabetes 10/03/2014  . Abnormal weight gain 10/03/2014  . Ventricular pre-excitation  with arrhythmia 10/03/2014  . GERD (gastroesophageal reflux disease) 09/22/2014  . Anxiety 09/11/2014  . 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 Lymphedema of leg 01/04/2014  . History of asthma 10/29/2011  . Menopause praecox 09/10/2011  . Post-lymphadenectomy lymphedema of arm 09/10/2011  . Breast cancer of lower-outer quadrant of right female breast ([place]) 01/01/2011  . Headache, migraine 06/18/2006    Rosalyn Gess OTR/L,CLT 04/21/2017, 7:35 PM  [facility] [address] [place], [address] Phone: [phone]   Fax:  [phone]  Name: Sabrina Morris MRN: [id number] Date of Birth: 08-03-1965

## 2017-04-22 ENCOUNTER — Telehealth: Payer: Self-pay | Admitting: Hematology and Oncology

## 2017-04-22 NOTE — Telephone Encounter (Signed)
Called patient per 2/5 sch message - no answer - left message with call back number for patient to call back when ready to reschedule.

## 2017-04-24 ENCOUNTER — Ambulatory Visit: Payer: BLUE CROSS/BLUE SHIELD | Admitting: Psychology

## 2017-04-24 ENCOUNTER — Ambulatory Visit: Payer: Self-pay | Admitting: Psychology

## 2017-04-30 ENCOUNTER — Ambulatory Visit: Payer: BLUE CROSS/BLUE SHIELD | Admitting: Physician Assistant

## 2017-05-04 ENCOUNTER — Ambulatory Visit (INDEPENDENT_AMBULATORY_CARE_PROVIDER_SITE_OTHER): Payer: BLUE CROSS/BLUE SHIELD | Admitting: Psychology

## 2017-05-04 DIAGNOSIS — F330 Major depressive disorder, recurrent, mild: Secondary | ICD-10-CM | POA: Diagnosis not present

## 2017-05-05 ENCOUNTER — Encounter: Payer: Self-pay | Admitting: Physician Assistant

## 2017-05-05 ENCOUNTER — Ambulatory Visit: Payer: BLUE CROSS/BLUE SHIELD | Admitting: Physician Assistant

## 2017-05-05 VITALS — BP 110/70 | HR 93 | Temp 97.7°F | Resp 16 | Wt 221.9 lb

## 2017-05-05 DIAGNOSIS — Z13220 Encounter for screening for lipoid disorders: Secondary | ICD-10-CM

## 2017-05-05 DIAGNOSIS — I890 Lymphedema, not elsewhere classified: Secondary | ICD-10-CM

## 2017-05-05 DIAGNOSIS — Z131 Encounter for screening for diabetes mellitus: Secondary | ICD-10-CM | POA: Diagnosis not present

## 2017-05-05 DIAGNOSIS — I972 Postmastectomy lymphedema syndrome: Secondary | ICD-10-CM

## 2017-05-05 DIAGNOSIS — Z136 Encounter for screening for cardiovascular disorders: Secondary | ICD-10-CM | POA: Diagnosis not present

## 2017-05-05 DIAGNOSIS — Z780 Asymptomatic menopausal state: Secondary | ICD-10-CM

## 2017-05-05 DIAGNOSIS — E279 Disorder of adrenal gland, unspecified: Secondary | ICD-10-CM | POA: Diagnosis not present

## 2017-05-05 NOTE — Progress Notes (Signed)
Patient: Sabrina Morris Female    DOB: [birth year]   52 y.o.   MRN: [id number] Visit Date: 05/05/2017  Today's Provider: Mar Daring, PA-C   Chief Complaint  Patient presents with  . Disability   Subjective:    HPI Patient is here to have disability form fill out.She reports that her permanent disability is approved and now she need the long term disability form completed today. She has chronic and worsening edema s/p mastectomy due to inflammatory breast cancer. She is also requesting labs to be done as her CPE is due in two to three weeks. She is fasting today.     Allergies  Allergen Reactions  . Sumatriptan Other (See Comments) and Shortness Of Breath  . Prometrium [Progesterone Micronized] Other (See Comments)    dizziness  . Phentermine-Topiramate Other (See Comments)  . Sulfa Antibiotics Other (See Comments)    GI distress  . Sulfamethoxazole Other (See Comments)  . Tamoxifen Other (See Comments)    Edema, excess fluid  . Zoladex  [Goserelin]   . Hydrocodone-Acetaminophen Rash    Hyperactivity     Current Outpatient Medications:  .  calcium carbonate 100 mg/ml SUSP, Take by mouth., Disp: , Rfl:  .  Cholecalciferol (VITAMIN D3) 5000 units TABS, Take by mouth., Disp: , Rfl:  .  cyclobenzaprine (FLEXERIL) 5 MG tablet, Take 1 tablet (5 mg total) by mouth 3 (three) times daily as needed., Disp: 90 tablet, Rfl: 1 .  docusate sodium (STOOL SOFTENER) 100 MG capsule, Take by mouth., Disp: , Rfl:  .  escitalopram (LEXAPRO) 20 MG tablet, TAKE 1/2 TABLET BY MOUTH DAILY, Disp: 30 tablet, Rfl: 11 .  estradiol (ESTRACE) 0.5 MG tablet, , Disp: , Rfl: 2 .  fluocinonide (LIDEX) 0.05 % external solution, Apply 1 application topically 2 (two) times daily., Disp: 60 mL, Rfl: 3 .  furosemide (LASIX) 20 MG tablet, TAKE 2 TABLETS BY MOUTH DAILY, Disp: 60 tablet, Rfl: 5 .  ibuprofen (ADVIL,MOTRIN) 600 MG tablet, Take 1 tablet (600 mg total) by mouth every 8 (eight) hours as  needed., Disp: 360 tablet, Rfl: 1 .  LORazepam (ATIVAN) 1 MG tablet, TAKE 1 TABLET BY MOUTH TWICE A DAY AS NEEDED., Disp: 60 tablet, Rfl: 5 .  Multiple Vitamin (MULTIVITAMIN) capsule, Take by mouth., Disp: , Rfl:  .  oxyCODONE-acetaminophen (PERCOCET/ROXICET) 5-325 MG tablet, Take 1-2 tablets by mouth every 6 (six) hours as needed for severe pain., Disp: 30 tablet, Rfl: 0 .  phentermine 15 MG capsule, Take 1-2 capsules (15-30 mg total) by mouth every morning., Disp: 60 capsule, Rfl: 0 .  potassium chloride (K-DUR,KLOR-CON) 10 MEQ tablet, TAKE 1 TABLET EVERY DAY, Disp: 90 tablet, Rfl: 1 .  progesterone (PROMETRIUM) 100 MG capsule, Take 100 mg by mouth., Disp: , Rfl:  .  promethazine (PHENERGAN) 25 MG tablet, Take 1 tablet (25 mg total) by mouth every 8 (eight) hours as needed for nausea or vomiting., Disp: 20 tablet, Rfl: 0 .  rizatriptan (MAXALT) 10 MG tablet, TAKE 1 TABLET AT FIRST SIGN OF MIGRAINE SYMPTOMS. IF NO RELIEF, A SECOND TABLET MAY BE TAKEN IN 2 HOURS. LIMIT 2 TABLETSPER DAY AND 5 TABLET, Disp: 15 tablet, Rfl: 3 .  albuterol (PROVENTIL HFA;VENTOLIN HFA) 108 (90 Base) MCG/ACT inhaler, Inhale 1-2 puffs into the lungs every 6 (six) hours as needed for wheezing or shortness of breath. (Patient not taking: Reported on 05/05/2017), Disp: 1 Inhaler, Rfl: 3  Review of Systems  Constitutional:  Positive for fatigue.  Respiratory: Negative.   Cardiovascular: Positive for leg swelling ("on medication"). Negative for chest pain and palpitations.  Gastrointestinal: Negative.   Neurological: Negative.     Social History   Tobacco Use  . Smoking status: Never Smoker  . Smokeless tobacco: Never Used  Substance Use Topics  . Alcohol use: No   Objective:   BP 110/70 (BP Location: Left Arm, Patient Position: Sitting, Cuff Size: Normal)   Pulse 93   Temp 97.7 F (36.5 C) (Oral)   Resp 16   Wt 221 lb 14.4 oz (100.7 kg)   SpO2 98%   BMI 36.93 kg/m    Physical Exam  Constitutional: She  appears well-developed and well-nourished. No distress.  Neck: Normal range of motion. Neck supple. No JVD present. No tracheal deviation present. No thyromegaly present.  Cardiovascular: Normal rate, regular rhythm and normal heart sounds. Exam reveals no gallop and no friction rub.  No murmur heard. Pulmonary/Chest: Effort normal and breath sounds normal. No respiratory distress. She has no wheezes. She has no rales.  Musculoskeletal: She exhibits edema.  Lymphadenopathy:    She has no cervical adenopathy.  Skin: She is not diaphoretic.  Vitals reviewed.      Assessment & Plan:     1. Acquired lymphedema Disability forms completed today. Will check labs as below. I will see her back in 4 weeks for her CPE.  - CBC w/Diff/Platelet - Comprehensive Metabolic Panel (CMET) - Thyroid Panel With TSH  2. 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 See above medical treatment plan. - CBC w/Diff/Platelet - Comprehensive Metabolic Panel (CMET) - Thyroid Panel With TSH  3. Postmenopausal See above medical treatment plan. - CBC w/Diff/Platelet - Comprehensive Metabolic Panel (CMET) - Thyroid Panel With TSH - Estradiol - 17-Hydroxyprogesterone  4. Adrenal disorder ([place]) Will check labs as below and f/u pending results. Followed by Endocrinology NP in [place].  - CBC w/Diff/Platelet - Comprehensive Metabolic Panel (CMET) - Thyroid Panel With TSH - HgB A1c - Estradiol - 17-Hydroxyprogesterone  5. Encounter for lipid screening for cardiovascular disease Will check labs as below and f/u pending results. - CBC w/Diff/Platelet - Comprehensive Metabolic Panel (CMET) - Lipid Profile  6. Diabetes mellitus screening Will check labs as below and f/u pending results. - CBC w/Diff/Platelet - Comprehensive Metabolic Panel (CMET) - HgB A1c       Mar Daring, PA-C  [facility]

## 2017-05-05 NOTE — Patient Instructions (Signed)

## 2017-05-08 LAB — 17-HYDROXYPROGESTERONE: 17-Hydroxyprogesterone: <10 ng/dL

## 2017-05-08 LAB — LIPID PANEL
Chol/HDL Ratio: 3.7 ratio (ref 0.0–4.4)
Cholesterol, Total: 195 mg/dL (ref 100–199)
HDL: 53 mg/dL (ref >39)
LDL Calculated: 115 mg/dL — ABNORMAL HIGH (ref 0–99)
TRIGLYCERIDES: 137 mg/dL (ref 0–149)
VLDL Cholesterol Cal: 27 mg/dL (ref 5–40)

## 2017-05-08 LAB — COMPREHENSIVE METABOLIC PANEL
A/G RATIO: 1.8 (ref 1.2–2.2)
ALBUMIN: 4.6 g/dL (ref 3.5–5.5)
ALT: 27 IU/L (ref 0–32)
AST: 25 IU/L (ref 0–40)
Alkaline Phosphatase: 82 IU/L (ref 39–117)
BILIRUBIN TOTAL: 0.2 mg/dL (ref 0.0–1.2)
BUN / CREAT RATIO: 15 (ref 9–23)
BUN: 13 mg/dL (ref 6–24)
CHLORIDE: 101 mmol/L (ref 96–106)
CO2: 19 mmol/L — ABNORMAL LOW (ref 20–29)
Calcium: 9.5 mg/dL (ref 8.7–10.2)
Creatinine, Ser: 0.89 mg/dL (ref 0.57–1.00)
GFR calc Af Amer: 86 mL/min/{1.73_m2} (ref >59)
GFR calc non Af Amer: 75 mL/min/{1.73_m2} (ref >59)
GLOBULIN, TOTAL: 2.5 g/dL (ref 1.5–4.5)
GLUCOSE: 95 mg/dL (ref 65–99)
POTASSIUM: 4.5 mmol/L (ref 3.5–5.2)
SODIUM: 139 mmol/L (ref 134–144)
Total Protein: 7.1 g/dL (ref 6.0–8.5)

## 2017-05-08 LAB — CBC WITH DIFFERENTIAL/PLATELET
BASOS ABS: 0 10*3/uL (ref 0.0–0.2)
BASOS: 1 %
EOS (ABSOLUTE): 0.2 10*3/uL (ref 0.0–0.4)
Eos: 4 %
HEMOGLOBIN: 14.8 g/dL (ref 11.1–15.9)
Hematocrit: 43.7 % (ref 34.0–46.6)
Immature Grans (Abs): 0 10*3/uL (ref 0.0–0.1)
Immature Granulocytes: 0 %
LYMPHS ABS: 1.8 10*3/uL (ref 0.7–3.1)
Lymphs: 31 %
MCH: 30.6 pg (ref 26.6–33.0)
MCHC: 33.9 g/dL (ref 31.5–35.7)
MCV: 91 fL (ref 79–97)
Monocytes Absolute: 0.4 10*3/uL (ref 0.1–0.9)
Monocytes: 7 %
NEUTROS ABS: 3.4 10*3/uL (ref 1.4–7.0)
Neutrophils: 57 %
Platelets: 246 10*3/uL (ref 150–379)
RBC: 4.83 x10E6/uL (ref 3.77–5.28)
RDW: 13.7 % (ref 12.3–15.4)
WBC: 5.8 10*3/uL (ref 3.4–10.8)

## 2017-05-08 LAB — THYROID PANEL WITH TSH
FREE THYROXINE INDEX: 1.5 (ref 1.2–4.9)
T3 UPTAKE RATIO: 19 % — AB (ref 24–39)
T4 TOTAL: 7.8 ug/dL (ref 4.5–12.0)
TSH: 4.05 u[IU]/mL (ref 0.450–4.500)

## 2017-05-08 LAB — HEMOGLOBIN A1C
Est. average glucose Bld gHb Est-mCnc: 111 mg/dL
HEMOGLOBIN A1C: 5.5 % (ref 4.8–5.6)

## 2017-05-08 LAB — ESTRADIOL: Estradiol: 32.5 pg/mL

## 2017-05-18 ENCOUNTER — Ambulatory Visit: Payer: BLUE CROSS/BLUE SHIELD | Admitting: Psychology

## 2017-05-18 DIAGNOSIS — F330 Major depressive disorder, recurrent, mild: Secondary | ICD-10-CM

## 2017-05-19 ENCOUNTER — Encounter: Payer: Self-pay | Admitting: Cardiovascular Disease

## 2017-05-28 ENCOUNTER — Ambulatory Visit: Payer: BLUE CROSS/BLUE SHIELD | Admitting: Physician Assistant

## 2017-06-01 ENCOUNTER — Ambulatory Visit: Payer: BLUE CROSS/BLUE SHIELD | Admitting: Psychology

## 2017-06-08 ENCOUNTER — Other Ambulatory Visit: Payer: Self-pay | Admitting: Physician Assistant

## 2017-06-08 DIAGNOSIS — R634 Abnormal weight loss: Secondary | ICD-10-CM

## 2017-06-08 MED ORDER — PHENTERMINE HCL 15 MG PO CAPS: 15.0000 mg | ORAL_CAPSULE | ORAL | 2 refills | Status: DC

## 2017-06-11 ENCOUNTER — Ambulatory Visit: Payer: BLUE CROSS/BLUE SHIELD | Admitting: Physician Assistant

## 2017-06-11 ENCOUNTER — Encounter: Payer: Self-pay | Admitting: Physician Assistant

## 2017-06-11 VITALS — BP 140/100 | HR 110 | Temp 98.2°F | Resp 16 | Wt 224.0 lb

## 2017-06-11 DIAGNOSIS — J40 Bronchitis, not specified as acute or chronic: Secondary | ICD-10-CM

## 2017-06-11 MED ORDER — AMOXICILLIN-POT CLAVULANATE 875-125 MG PO TABS: 1.0000 | ORAL_TABLET | Freq: Two times a day (BID) | ORAL | 0 refills | Status: DC

## 2017-06-11 MED ORDER — FLUTICASONE-SALMETEROL 250-50 MCG/DOSE IN AEPB: 1.0000 | INHALATION_SPRAY | Freq: Two times a day (BID) | RESPIRATORY_TRACT | 5 refills | Status: DC

## 2017-06-11 MED ORDER — ALBUTEROL SULFATE HFA 108 (90 BASE) MCG/ACT IN AERS
1.0000 | INHALATION_SPRAY | Freq: Four times a day (QID) | RESPIRATORY_TRACT | 3 refills | Status: DC | PRN
Start: 2017-06-11 — End: 2018-02-15

## 2017-06-11 MED ORDER — PREDNISONE 10 MG PO TABS: ORAL_TABLET | ORAL | 0 refills | Status: DC

## 2017-06-11 MED ORDER — HYDROCOD POLST-CPM POLST ER 10-8 MG/5ML PO SUER: 5.0000 mL | Freq: Two times a day (BID) | ORAL | 0 refills | Status: DC | PRN

## 2017-06-11 NOTE — Progress Notes (Signed)
Patient: Sabrina Morris Female    DOB: April 21, 1965   52 y.o.   MRN: [id number] Visit Date: 06/11/2017  Today's Provider: Mar Daring, PA-C   Chief Complaint  Patient presents with  . URI   Subjective:    URI   This is a new problem. The current episode started 1 to 4 weeks ago (3 weeks). The problem has been gradually worsening. There has been no fever. Associated symptoms include coughing, ear pain, a plugged ear sensation and a sore throat. Pertinent negatives include no chest pain, congestion, headaches, rhinorrhea, sinus pain, sneezing, vomiting or wheezing. She has tried inhaler use and decongestant (albuterol, 2-3 days of prednisone taper that she had left. Mucinex.took some left over Tussionex) for the symptoms. The treatment provided mild relief.      Allergies  Allergen Reactions  . Sumatriptan Other (See Comments) and Shortness Of Breath  . Prometrium [Progesterone Micronized] Other (See Comments)    dizziness  . Phentermine-Topiramate Other (See Comments)  . Sulfa Antibiotics Other (See Comments)    GI distress  . Sulfamethoxazole Other (See Comments)  . Tamoxifen Other (See Comments)    Edema, excess fluid  . Zoladex  [Goserelin]   . Hydrocodone-Acetaminophen Rash    Hyperactivity     Current Outpatient Medications:  .  albuterol (PROVENTIL HFA;VENTOLIN HFA) 108 (90 Base) MCG/ACT inhaler, Inhale 1-2 puffs into the lungs every 6 (six) hours as needed for wheezing or shortness of breath., Disp: 1 Inhaler, Rfl: 3 .  calcium carbonate 100 mg/ml SUSP, Take by mouth., Disp: , Rfl:  .  Cholecalciferol (VITAMIN D3) 5000 units TABS, Take by mouth., Disp: , Rfl:  .  cyclobenzaprine (FLEXERIL) 5 MG tablet, Take 1 tablet (5 mg total) by mouth 3 (three) times daily as needed., Disp: 90 tablet, Rfl: 1 .  docusate sodium (STOOL SOFTENER) 100 MG capsule, Take by mouth., Disp: , Rfl:  .  escitalopram (LEXAPRO) 20 MG tablet, TAKE 1/2 TABLET BY MOUTH DAILY, Disp:  30 tablet, Rfl: 11 .  estradiol (ESTRACE) 0.5 MG tablet, , Disp: , Rfl: 2 .  fluocinonide (LIDEX) 0.05 % external solution, Apply 1 application topically 2 (two) times daily., Disp: 60 mL, Rfl: 3 .  furosemide (LASIX) 20 MG tablet, TAKE 2 TABLETS BY MOUTH DAILY, Disp: 60 tablet, Rfl: 5 .  ibuprofen (ADVIL,MOTRIN) 600 MG tablet, Take 1 tablet (600 mg total) by mouth every 8 (eight) hours as needed., Disp: 360 tablet, Rfl: 1 .  LORazepam (ATIVAN) 1 MG tablet, TAKE 1 TABLET BY MOUTH TWICE A DAY AS NEEDED., Disp: 60 tablet, Rfl: 5 .  Multiple Vitamin (MULTIVITAMIN) capsule, Take by mouth., Disp: , Rfl:  .  oxyCODONE-acetaminophen (PERCOCET/ROXICET) 5-325 MG tablet, Take 1-2 tablets by mouth every 6 (six) hours as needed for severe pain., Disp: 30 tablet, Rfl: 0 .  phentermine 15 MG capsule, Take 1-2 capsules (15-30 mg total) by mouth every morning., Disp: 60 capsule, Rfl: 2 .  potassium chloride (K-DUR,KLOR-CON) 10 MEQ tablet, TAKE 1 TABLET EVERY DAY, Disp: 90 tablet, Rfl: 1 .  progesterone (PROMETRIUM) 100 MG capsule, Take 100 mg by mouth., Disp: , Rfl:  .  promethazine (PHENERGAN) 25 MG tablet, Take 1 tablet (25 mg total) by mouth every 8 (eight) hours as needed for nausea or vomiting., Disp: 20 tablet, Rfl: 0 .  rizatriptan (MAXALT) 10 MG tablet, TAKE 1 TABLET AT FIRST SIGN OF MIGRAINE SYMPTOMS. IF NO RELIEF, A SECOND TABLET MAY BE TAKEN  IN 2 HOURS. LIMIT 2 TABLETSPER DAY AND 5 TABLET, Disp: 15 tablet, Rfl: 3  Review of Systems  HENT: Positive for ear pain, postnasal drip and sore throat. Negative for congestion, rhinorrhea, sinus pressure, sinus pain and sneezing.   Respiratory: Positive for cough and shortness of breath. Negative for chest tightness and wheezing.   Cardiovascular: Positive for leg swelling. Negative for chest pain and palpitations.  Gastrointestinal: Negative for vomiting.  Neurological: Negative for headaches.    Social History   Tobacco Use  . Smoking status: Never Smoker   . Smokeless tobacco: Never Used  Substance Use Topics  . Alcohol use: No   Objective:   BP (!) 140/100 (BP Location: Left Arm, Patient Position: Sitting, Cuff Size: Large)   Pulse (!) 110   Temp 98.2 F (36.8 C) (Oral)   Resp 16   Wt 224 lb (101.6 kg)   SpO2 98%   BMI 37.28 kg/m    Physical Exam  Constitutional: She appears well-developed and well-nourished. No distress.  HENT:  Head: Normocephalic and atraumatic.  Right Ear: Hearing, tympanic membrane, external ear and ear canal normal.  Left Ear: Hearing, tympanic membrane, external ear and ear canal normal.  Nose: Nose normal.  Mouth/Throat: Uvula is midline, oropharynx is clear and moist and mucous membranes are normal. No oropharyngeal exudate.  Eyes: Pupils are equal, round, and reactive to light. Conjunctivae are normal. Right eye exhibits no discharge. Left eye exhibits no discharge. No scleral icterus.  Neck: Normal range of motion. Neck supple. No tracheal deviation present. No thyromegaly present.  Cardiovascular: Normal rate, regular rhythm and normal heart sounds. Exam reveals no gallop and no friction rub.  No murmur heard. Pulmonary/Chest: Effort normal. No stridor. No respiratory distress. She has wheezes (throughout expiratory). She has no rales.  Lymphadenopathy:    She has no cervical adenopathy.  Skin: Skin is warm and dry. She is not diaphoretic.  Vitals reviewed.       Assessment & Plan:     1. Bronchitis Worsening. Will treat with prednisone and augmentin as below. Advair and albuterol refilled. Will give Tussionex cough syrup as below for nighttime cough. Drowsiness precautions given to patient. Stay well hydrated. Use delsym, robitussin OR mucinex for daytime cough. - albuterol (PROVENTIL HFA;VENTOLIN HFA) 108 (90 Base) MCG/ACT inhaler; Inhale 1-2 puffs into the lungs every 6 (six) hours as needed for wheezing or shortness of breath.  Dispense: 1 Inhaler; Refill: 3 - Fluticasone-Salmeterol (ADVAIR)  250-50 MCG/DOSE AEPB; Inhale 1 puff into the lungs 2 (two) times daily.  Dispense: 60 each; Refill: 5 - predniSONE (DELTASONE) 10 MG tablet; Take 6 tabs PO on day 1&2, 5 tabs PO on day 3&4, 4 tabs PO on day 5&6, 3 tabs PO on day 7&8, 2 tabs PO on day 9&10, 1 tab PO on day 11&12.  Dispense: 42 tablet; Refill: 0 - chlorpheniramine-HYDROcodone (TUSSIONEX PENNKINETIC ER) 10-8 MG/5ML SUER; Take 5 mLs by mouth every 12 (twelve) hours as needed for cough.  Dispense: 140 mL; Refill: 0 - amoxicillin-clavulanate (AUGMENTIN) 875-125 MG tablet; Take 1 tablet by mouth 2 (two) times daily.  Dispense: 20 tablet; Refill: 0       Mar Daring, PA-C  [facility]

## 2017-06-11 NOTE — Patient Instructions (Signed)

## 2017-06-15 ENCOUNTER — Ambulatory Visit: Payer: BLUE CROSS/BLUE SHIELD | Admitting: Psychology

## 2017-06-15 DIAGNOSIS — F330 Major depressive disorder, recurrent, mild: Secondary | ICD-10-CM

## 2017-06-22 ENCOUNTER — Encounter: Payer: Self-pay | Admitting: Physician Assistant

## 2017-06-22 ENCOUNTER — Other Ambulatory Visit: Payer: Self-pay

## 2017-06-22 ENCOUNTER — Ambulatory Visit (INDEPENDENT_AMBULATORY_CARE_PROVIDER_SITE_OTHER): Payer: BLUE CROSS/BLUE SHIELD | Admitting: Obstetrics and Gynecology

## 2017-06-22 ENCOUNTER — Encounter: Payer: Self-pay | Admitting: Obstetrics and Gynecology

## 2017-06-22 VITALS — BP 128/82 | HR 96 | Ht 65.0 in | Wt 223.0 lb

## 2017-06-22 DIAGNOSIS — Z1239 Encounter for other screening for malignant neoplasm of breast: Secondary | ICD-10-CM

## 2017-06-22 DIAGNOSIS — B001 Herpesviral vesicular dermatitis: Secondary | ICD-10-CM

## 2017-06-22 DIAGNOSIS — Z01419 Encounter for gynecological examination (general) (routine) without abnormal findings: Secondary | ICD-10-CM

## 2017-06-22 DIAGNOSIS — Z1231 Encounter for screening mammogram for malignant neoplasm of breast: Secondary | ICD-10-CM | POA: Diagnosis not present

## 2017-06-22 DIAGNOSIS — Z79890 Hormone replacement therapy (postmenopausal): Secondary | ICD-10-CM | POA: Diagnosis not present

## 2017-06-22 DIAGNOSIS — B373 Candidiasis of vulva and vagina: Secondary | ICD-10-CM | POA: Diagnosis not present

## 2017-06-22 DIAGNOSIS — B3731 Acute candidiasis of vulva and vagina: Secondary | ICD-10-CM

## 2017-06-22 LAB — POCT WET PREP WITH KOH
CLUE CELLS WET PREP PER HPF POC: NEGATIVE
KOH PREP POC: NEGATIVE
TRICHOMONAS UA: NEGATIVE
YEAST WET PREP PER HPF POC: POSITIVE

## 2017-06-22 MED ORDER — VALACYCLOVIR HCL 500 MG PO TABS: 500.0000 mg | ORAL_TABLET | Freq: Two times a day (BID) | ORAL | 5 refills | Status: DC

## 2017-06-22 MED ORDER — FLUCONAZOLE 150 MG PO TABS: 150.0000 mg | ORAL_TABLET | Freq: Once | ORAL | 0 refills | Status: AC

## 2017-06-22 NOTE — Patient Instructions (Signed)
I value your feedback and entrusting us with your care. If you get a [facility] patient survey, I would appreciate you taking the time to let us know about your experience today. Thank you! 

## 2017-06-22 NOTE — Progress Notes (Signed)
HPI:      [name] is a 52 y.o. G0P0000 who LMP was No LMP recorded. Patient is postmenopausal., presents today for her annual examination.  Her menses are absent.  She does not have intermenstrual bleeding.  She does have some vasomotor sx. She uses oral estradiol, testosterone crm, and oral progesterone meds, prescribed by NP in [place].  She tried topical but wasn't absorbing meds due to lymphedema. She has dominant estrogen levels and is progesterone and testosterone are low.   She is not sexually active. She does have vaginal dryness and is on prem vag crm. She has had increased vag irritation/dryness/redness recently. She was on abx recently. She tried a natural moisturizer last night with some relief.   Last Pap: 05/29/16  Results were: no abnormalities/neg HPV DNA  Last mammogram: 07/14/16 . Results were: normal--routine follow-up in 12 months There is a FH of breast cancer in her sister, age 46. She had neg genetic testing at [facility]. Pt had neg BRCA testing at [facility] in the past. There is no FH of ovarian cancer. The patient does do self-breast exams.  Colonoscopy: colonoscopy 2 years ago with abnormalities. Repeat due 2020.  Tobacco use: The patient denies current or previous tobacco use. Alcohol use: none Exercise: not active  She does get adequate calcium and Vitamin D in her diet.  Labs with PCP.   Past Medical History:  Diagnosis Date  . Adrenal disorder ([place])   . Breast cancer ([place])   . H/O bone density study 2015  . H/O colonoscopy    sch 06/12/15  . Lymphedema   . Lymphedema   . Migraine   . Pap smear for cervical cancer screening [id number]    Past Surgical History:  Procedure Laterality Date  . breast  Left 02/18/2017   Implant removed  . BREAST ENHANCEMENT SURGERY Bilateral 2005  . CARDIAC CATHETERIZATION  1994  . CARDIAC CATHETERIZATION  1994  . MASTECTOMY Right 02/2011  . PORT A CATH INJECTION ([place] [facility])  2013    Family History    Adopted: Yes  Problem Relation Age of Onset  . Breast cancer Sister 82       1/2 sister; Genetic testing neg at [facility] History  . Marital status: Single    Spouse name: Not on file  . Number of children: Not on file  . Years of education: Not on file  . Highest education level: Not on file  Occupational History  . Occupation: Programmer, multimedia: [employer]    Comment: Full Time  Social Needs  . Financial resource strain: Not on file  . Food insecurity:    Worry: Not on file    Inability: Not on file  . Transportation needs:    Medical: Not on file    Non-medical: Not on file  Tobacco Use  . Smoking status: Never Smoker  . Smokeless tobacco: Never Used  Substance and Sexual Activity  . Alcohol use: No  . Drug use: No  . Sexual activity: Never  Lifestyle  . Physical activity:    Days per week: 0 days    Minutes per session: Not on file  . Stress: Not on file  Relationships  . Social connections:    Talks on phone: Not on file    Gets together: Not on file    Attends religious service: Not on file    Active member of  club or organization: Not on file    Attends meetings of clubs or organizations: Not on file    Relationship status: Not on file  . Intimate partner violence:    Fear of current or ex partner: Not on file    Emotionally abused: Not on file    Physically abused: Not on file    Forced sexual activity: Not on file  Other Topics Concern  . Not on file  Social History Narrative  . Not on file    Current Outpatient Medications on File Prior to Visit  Medication Sig Dispense Refill  . albuterol (PROVENTIL HFA;VENTOLIN HFA) 108 (90 Base) MCG/ACT inhaler Inhale 1-2 puffs into the lungs every 6 (six) hours as needed for wheezing or shortness of breath. 1 Inhaler 3  . calcium carbonate 100 mg/ml SUSP Take by mouth.    . Cholecalciferol (VITAMIN D3) 5000 units TABS Take by mouth.    . cyclobenzaprine (FLEXERIL) 5  MG tablet Take 1 tablet (5 mg total) by mouth 3 (three) times daily as needed. 90 tablet 1  . docusate sodium (STOOL SOFTENER) 100 MG capsule Take by mouth.    . escitalopram (LEXAPRO) 20 MG tablet TAKE 1/2 TABLET BY MOUTH DAILY 30 tablet 11  . estradiol (ESTRACE) 0.5 MG tablet   2  . fluocinonide (LIDEX) 0.05 % external solution Apply 1 application topically 2 (two) times daily. 60 mL 3  . Fluticasone-Salmeterol (ADVAIR) 250-50 MCG/DOSE AEPB Inhale 1 puff into the lungs 2 (two) times daily. 60 each 5  . furosemide (LASIX) 20 MG tablet TAKE 2 TABLETS BY MOUTH DAILY 60 tablet 5  . ibuprofen (ADVIL,MOTRIN) 600 MG tablet Take 1 tablet (600 mg total) by mouth every 8 (eight) hours as needed. 360 tablet 1  . LORazepam (ATIVAN) 1 MG tablet TAKE 1 TABLET BY MOUTH TWICE A DAY AS NEEDED. 60 tablet 5  . Multiple Vitamin (MULTIVITAMIN) capsule Take by mouth.    . oxyCODONE-acetaminophen (PERCOCET/ROXICET) 5-325 MG tablet Take 1-2 tablets by mouth every 6 (six) hours as needed for severe pain. 30 tablet 0  . phentermine 15 MG capsule Take 1-2 capsules (15-30 mg total) by mouth every morning. 60 capsule 2  . potassium chloride (K-DUR,KLOR-CON) 10 MEQ tablet TAKE 1 TABLET EVERY DAY 90 tablet 1  . progesterone (PROMETRIUM) 100 MG capsule Take 100 mg by mouth.    . promethazine (PHENERGAN) 25 MG tablet Take 1 tablet (25 mg total) by mouth every 8 (eight) hours as needed for nausea or vomiting. 20 tablet 0  . rizatriptan (MAXALT) 10 MG tablet TAKE 1 TABLET AT FIRST SIGN OF MIGRAINE SYMPTOMS. IF NO RELIEF, A SECOND TABLET MAY BE TAKEN IN 2 HOURS. LIMIT 2 TABLETSPER DAY AND 5 TABLET 15 tablet 3  . amoxicillin-clavulanate (AUGMENTIN) 875-125 MG tablet Take 1 tablet by mouth 2 (two) times daily. (Patient not taking: Reported on 06/22/2017) 20 tablet 0  . chlorpheniramine-HYDROcodone (TUSSIONEX PENNKINETIC ER) 10-8 MG/5ML SUER Take 5 mLs by mouth every 12 (twelve) hours as needed for cough. (Patient not taking: Reported on  06/22/2017) 140 mL 0  . predniSONE (DELTASONE) 10 MG tablet Take 6 tabs PO on day 1&2, 5 tabs PO on day 3&4, 4 tabs PO on day 5&6, 3 tabs PO on day 7&8, 2 tabs PO on day 9&10, 1 tab PO on day 11&12. (Patient not taking: Reported on 06/22/2017) 42 tablet 0   No current facility-administered medications on file prior to visit.  ROS:  Review of Systems  Constitutional: Positive for fatigue. Negative for fever and unexpected weight change.  Respiratory: Negative for cough, shortness of breath and wheezing.   Cardiovascular: Negative for chest pain, palpitations and leg swelling.  Gastrointestinal: Negative for blood in stool, constipation, diarrhea, nausea and vomiting.  Endocrine: Negative for cold intolerance, heat intolerance and polyuria.  Genitourinary: Positive for vaginal discharge. Negative for dyspareunia, dysuria, flank pain, frequency, genital sores, hematuria, menstrual problem, pelvic pain, urgency, vaginal bleeding and vaginal pain.  Musculoskeletal: Negative for back pain, joint swelling and myalgias.  Skin: Negative for rash.  Neurological: Negative for dizziness, syncope, light-headedness, numbness and headaches.  Hematological: Negative for adenopathy.  Psychiatric/Behavioral: Negative for agitation, confusion, sleep disturbance and suicidal ideas. The patient is not nervous/anxious.      Objective: BP 128/82 (BP Location: Left Arm, Patient Position: Sitting, Cuff Size: Small)   Pulse 96   Ht '5\' 5"'$  (1.651 m)   Wt 223 lb (101.2 kg)   BMI 37.11 kg/m    Physical Exam  Constitutional: She is oriented to person, place, and time. She appears well-developed and well-nourished.  Genitourinary: Uterus normal. There is no rash or tenderness on the right labia. There is no rash or tenderness on the left labia. There is tenderness in the vagina. No erythema in the vagina. Vaginal discharge found. Right adnexum does not display mass and does not display tenderness. Left adnexum  does not display mass and does not display tenderness. Cervix does not exhibit motion tenderness or polyp. Uterus is not enlarged or tender.  Neck: Normal range of motion. No thyromegaly present.  Cardiovascular: Normal rate, regular rhythm and normal heart sounds.  No murmur heard. Pulmonary/Chest: Effort normal and breath sounds normal. Right breast exhibits no mass, no nipple discharge, no skin change and no tenderness. Left breast exhibits no mass, no nipple discharge, no skin change and no tenderness.  Abdominal: Soft. There is no tenderness. There is no guarding.  Musculoskeletal: Normal range of motion.  Neurological: She is alert and oriented to person, place, and time. No cranial nerve deficit.  Psychiatric: She has a normal mood and affect. Her behavior is normal.  Vitals reviewed.   Results: Results for orders placed or performed in visit on 06/22/17 (from the past 24 hour(s))  POCT Wet Prep with KOH     Status: Abnormal   Collection Time: 06/22/17  1:58 PM  Result Value Ref Range   Trichomonas, UA Negative    Clue Cells Wet Prep HPF POC neg    Epithelial Wet Prep HPF POC  Few, Moderate, Many, Too numerous to count   Yeast Wet Prep HPF POC pos    Bacteria Wet Prep HPF POC  Few   RBC Wet Prep HPF POC     WBC Wet Prep HPF POC     KOH Prep POC Negative Negative    Assessment/Plan: Encounter for annual routine gynecological examination  Screening for breast cancer - Pt to sched mammo 6/19 (had LT implant removed 12/18).  Hormone replacement therapy (HRT) - Followed by NP in [place].   Candidal vaginitis - Pos wet prep/sx. Rx diflucan per pt pref. Will Rx terazol if sx persist. - Plan: POCT Wet Prep with KOH, fluconazole (DIFLUCAN) 150 MG tablet  Meds ordered this encounter  Medications  . fluconazole (DIFLUCAN) 150 MG tablet    Sig: Take 1 tablet (150 mg total) by mouth once for 1 dose.    Dispense:  1 tablet  Refill:  0    Order Specific Question:   Supervising  Provider    Answer:   Gae Dry [[id number]]             GYN counsel breast self exam, mammography screening, adequate intake of calcium and vitamin D, diet and exercise     F/U  Return in about 1 year (around 06/23/2018).  Alicia B. Copland, PA-C 06/22/2017 2:45 PM

## 2017-07-03 ENCOUNTER — Other Ambulatory Visit: Payer: Self-pay | Admitting: Physician Assistant

## 2017-07-03 DIAGNOSIS — G43109 Migraine with aura, not intractable, without status migrainosus: Secondary | ICD-10-CM

## 2017-07-13 ENCOUNTER — Ambulatory Visit: Payer: BLUE CROSS/BLUE SHIELD | Admitting: Psychology

## 2017-08-04 ENCOUNTER — Ambulatory Visit: Payer: BLUE CROSS/BLUE SHIELD | Admitting: Psychology

## 2017-08-05 ENCOUNTER — Other Ambulatory Visit: Payer: Self-pay | Admitting: Physician Assistant

## 2017-08-05 DIAGNOSIS — E876 Hypokalemia: Secondary | ICD-10-CM

## 2017-08-08 ENCOUNTER — Other Ambulatory Visit: Payer: Self-pay | Admitting: Physician Assistant

## 2017-08-08 DIAGNOSIS — F419 Anxiety disorder, unspecified: Secondary | ICD-10-CM

## 2017-08-17 ENCOUNTER — Telehealth: Payer: Self-pay

## 2017-08-17 ENCOUNTER — Ambulatory Visit: Payer: BLUE CROSS/BLUE SHIELD | Admitting: Psychology

## 2017-08-17 ENCOUNTER — Other Ambulatory Visit: Payer: Self-pay | Admitting: Hematology and Oncology

## 2017-08-17 ENCOUNTER — Encounter: Payer: Self-pay | Admitting: Physician Assistant

## 2017-08-17 DIAGNOSIS — F330 Major depressive disorder, recurrent, mild: Secondary | ICD-10-CM | POA: Diagnosis not present

## 2017-08-17 DIAGNOSIS — I890 Lymphedema, not elsewhere classified: Secondary | ICD-10-CM

## 2017-08-17 DIAGNOSIS — Z1239 Encounter for other screening for malignant neoplasm of breast: Secondary | ICD-10-CM

## 2017-08-17 DIAGNOSIS — Z1231 Encounter for screening mammogram for malignant neoplasm of breast: Secondary | ICD-10-CM

## 2017-08-17 DIAGNOSIS — Z853 Personal history of malignant neoplasm of breast: Secondary | ICD-10-CM

## 2017-08-17 NOTE — Telephone Encounter (Signed)
Pt called requesting appt- she left a VM and didn't specify if it is an urgent need or for her yearly.  Her yearly should have been for Feb 2019 - sent message to scheduling to call pt for an appt for this month June and to call pt.  Left pt a VM with scheduling should call her to schedule for June and if more urgent, left instructions for pt to call us back.

## 2017-08-18 ENCOUNTER — Encounter: Payer: Self-pay | Admitting: Physician Assistant

## 2017-08-24 ENCOUNTER — Ambulatory Visit
Admission: RE | Admit: 2017-08-24 | Discharge: 2017-08-24 | Disposition: A | Discharge status: Home / Self Care | Payer: BLUE CROSS/BLUE SHIELD | Source: Ambulatory Visit | Attending: Physician Assistant | Admitting: Physician Assistant

## 2017-08-24 DIAGNOSIS — I890 Lymphedema, not elsewhere classified: Secondary | ICD-10-CM

## 2017-08-24 DIAGNOSIS — Z1239 Encounter for other screening for malignant neoplasm of breast: Secondary | ICD-10-CM

## 2017-08-24 DIAGNOSIS — Z853 Personal history of malignant neoplasm of breast: Secondary | ICD-10-CM

## 2017-08-24 NOTE — Progress Notes (Signed)
Patient Care Team: Rubye Beach as PCP - General (Family Medicine)  DIAGNOSIS:  Encounter Diagnosis  Name Primary?  . Malignant neoplasm of lower-outer quadrant of right breast of female, estrogen receptor positive ([place])     SUMMARY OF ONCOLOGIC HISTORY:   Breast cancer of lower-outer quadrant of right female breast ([place])   01/01/2011 Initial Diagnosis    Patient presented with palpable lump in the right breast a dumbbell-shaped tumor was identified spanning 7 cm      02/24/2011 Surgery    Right mastectomy revealed a dumbbell-shaped mass 3.5 cm and 2.5 cm with a total span of 7 cm 1/20 lymph nodes were positive ER/PR positive HER-2 negative      05/07/2011 - 09/04/2011 Chemotherapy    Adjuvant chemotherapy with Taxol, Adriamycin, Cytoxan every 3 weeks 6 cycles at [facility] ([email or web address])      09/19/2011 - 10/29/2011 Radiation Therapy    Adjuvant radiation therapy 30 fractions including right axilla      12/01/2011 - 04/01/2013 Anti-estrogen oral therapy    Adjuvant tamoxifen was started reluctantly, Zoladex was added January 2014 and antiestrogen therapy was discontinued January 2015 (profound lymphedema and anasarca )       CHIEF COMPLIANT: Surveillance of breast cancer  INTERVAL HISTORY: Sabrina Morris is a 52 year old with above-mentioned history of right breast cancer treated with mastectomy followed by adjuvant chemotherapy and radiation and is currently in surveillance.  She does not want to take any antiestrogen therapy.  In fact she is taking estrogen replacement therapy because of profound menopausal symptoms.  She continues to have profound lymphedema and anasarca for which she is on diuretics.  She has noted multiple problems including swelling on the left side of her neck as well as the left axilla.  An ultrasound yesterday which were normal.  She is also complaining of some back discomfort.  She continues to undergo manual lymph drainage by physical  therapist.  REVIEW OF SYSTEMS:   Constitutional: Denies fevers, chills or abnormal weight loss Eyes: Denies blurriness of vision Ears, nose, mouth, throat, and face: Denies mucositis or sore throat Respiratory: Denies cough, dyspnea or wheezes Cardiovascular: Denies palpitation, chest discomfort Gastrointestinal:  Denies nausea, heartburn or change in bowel habits Skin: Denies abnormal skin rashes Lymphatics: Denies new lymphadenopathy or easy bruising Neurological:Denies numbness, tingling or new weaknesses Behavioral/Psych: Mood is stable, no new changes  Extremities: No lower extremity edema Breast: Swelling of the left breast and axilla All other systems were reviewed with the patient and are negative.  I have reviewed the past medical history, past surgical history, social history and family history with the patient and they are unchanged from previous note.  ALLERGIES:  is allergic to sumatriptan; prometrium [progesterone micronized]; phentermine-topiramate; sulfa antibiotics; sulfamethoxazole; tamoxifen; zoladex  [goserelin]; and hydrocodone-acetaminophen.  MEDICATIONS:  Current Outpatient Medications  Medication Sig Dispense Refill  . albuterol (PROVENTIL HFA;VENTOLIN HFA) 108 (90 Base) MCG/ACT inhaler Inhale 1-2 puffs into the lungs every 6 (six) hours as needed for wheezing or shortness of breath. 1 Inhaler 3  . amoxicillin-clavulanate (AUGMENTIN) 875-125 MG tablet Take 1 tablet by mouth 2 (two) times daily. (Patient not taking: Reported on 06/22/2017) 20 tablet 0  . calcium carbonate 100 mg/ml SUSP Take by mouth.    . chlorpheniramine-HYDROcodone (TUSSIONEX PENNKINETIC ER) 10-8 MG/5ML SUER Take 5 mLs by mouth every 12 (twelve) hours as needed for cough. (Patient not taking: Reported on 06/22/2017) 140 mL 0  . Cholecalciferol (VITAMIN D3) 5000 units TABS  Take by mouth.    . cyclobenzaprine (FLEXERIL) 5 MG tablet Take 1 tablet (5 mg total) by mouth 3 (three) times daily as needed.  90 tablet 1  . docusate sodium (STOOL SOFTENER) 100 MG capsule Take by mouth.    . escitalopram (LEXAPRO) 20 MG tablet TAKE 1/2 TABLET BY MOUTH DAILY 30 tablet 11  . estradiol (ESTRACE) 0.5 MG tablet   2  . fluocinonide (LIDEX) 0.05 % external solution Apply 1 application topically 2 (two) times daily. 60 mL 3  . Fluticasone-Salmeterol (ADVAIR) 250-50 MCG/DOSE AEPB Inhale 1 puff into the lungs 2 (two) times daily. 60 each 5  . furosemide (LASIX) 20 MG tablet TAKE 2 TABLETS BY MOUTH DAILY 60 tablet 5  . ibuprofen (ADVIL,MOTRIN) 600 MG tablet Take 1 tablet (600 mg total) by mouth every 8 (eight) hours as needed. 360 tablet 1  . LORazepam (ATIVAN) 1 MG tablet TAKE 1 TABLET BY MOUTH TWICE A DAY AS NEEDED. 60 tablet 5  . Multiple Vitamin (MULTIVITAMIN) capsule Take by mouth.    . oxyCODONE-acetaminophen (PERCOCET/ROXICET) 5-325 MG tablet Take 1-2 tablets by mouth every 6 (six) hours as needed for severe pain. 30 tablet 0  . phentermine 15 MG capsule Take 1-2 capsules (15-30 mg total) by mouth every morning. 60 capsule 2  . potassium chloride (K-DUR) 10 MEQ tablet TAKE 1 TABLET BY MOUTH DAILY 90 tablet 1  . potassium chloride (K-DUR,KLOR-CON) 10 MEQ tablet TAKE 1 TABLET EVERY DAY 90 tablet 1  . predniSONE (DELTASONE) 10 MG tablet Take 6 tabs PO on day 1&2, 5 tabs PO on day 3&4, 4 tabs PO on day 5&6, 3 tabs PO on day 7&8, 2 tabs PO on day 9&10, 1 tab PO on day 11&12. (Patient not taking: Reported on 06/22/2017) 42 tablet 0  . progesterone (PROMETRIUM) 100 MG capsule Take 100 mg by mouth.    . promethazine (PHENERGAN) 25 MG tablet Take 1 tablet (25 mg total) by mouth every 8 (eight) hours as needed for nausea or vomiting. 20 tablet 0  . rizatriptan (MAXALT) 10 MG tablet TAKE 1 TABLET AT FIRST SIGN OF MIGRAINE SYMPTOMS. IF NO RELIEF, A SECOND TABLET MAY BE TAKEN IN 2 HOURS. LIMIT 2 TABLETSPER DAY AND 5 TABLET 15 tablet 3  . valACYclovir (VALTREX) 500 MG tablet Take 1 tablet (500 mg total) by mouth 2 (two)  times daily. Prn fever blisters 30 tablet 5   No current facility-administered medications for this visit.     PHYSICAL EXAMINATION: ECOG PERFORMANCE STATUS: 1 - Symptomatic but completely ambulatory  Vitals:   08/25/17 0833  BP: 129/72  Pulse: 90  Resp: 18  Temp: 97.8 F (36.6 C)  SpO2: 98%   Filed Weights   08/25/17 0833  Weight: 223 lb 4.8 oz (101.3 kg)    GENERAL:alert, no distress and comfortable SKIN: skin color, texture, turgor are normal, no rashes or significant lesions EYES: normal, Conjunctiva are pink and non-injected, sclera clear OROPHARYNX:no exudate, no erythema and lips, buccal mucosa, and tongue normal  NECK: supple, thyroid normal size, non-tender, without nodularity LYMPH:  no palpable lymphadenopathy in the cervical, axillary or inguinal LUNGS: clear to auscultation and percussion with normal breathing effort HEART: regular rate & rhythm and no murmurs and no lower extremity edema ABDOMEN:abdomen soft, non-tender and normal bowel sounds MUSCULOSKELETAL:no cyanosis of digits and no clubbing  NEURO: alert & oriented x 3 with fluent speech, no focal motor/sensory deficits EXTREMITIES: No lower extremity edema BREAST: No palpable masses or  nodules in either right or left breasts. No palpable axillary supraclavicular or infraclavicular adenopathy no breast tenderness or nipple discharge. (exam performed in the presence of a chaperone)  LABORATORY DATA:  I have reviewed the data as listed CMP Latest Ref Rng & Units 05/05/2017 07/14/2016 10/16/2015  Glucose 65 - 99 mg/dL 95 95 86  BUN 6 - 24 mg/dL 13 16 14.6  Creatinine 0.57 - 1.00 mg/dL 0.89 0.93 0.9  Sodium 134 - 144 mmol/L 139 138 138  Potassium 3.5 - 5.2 mmol/L 4.5 4.5 4.1  Chloride 96 - 106 mmol/L 101 100 -  CO2 20 - 29 mmol/L 19(L) 19 25  Calcium 8.7 - 10.2 mg/dL 9.5 9.6 9.5  Total Protein 6.0 - 8.5 g/dL 7.1 6.9 7.4  Total Bilirubin 0.0 - 1.2 mg/dL 0.2 0.3 0.61  Alkaline Phos 39 - 117 IU/L 82 83 105    AST 0 - 40 IU/L '25 21 19  '$ ALT 0 - 32 IU/L '27 20 23    '$ Lab Results  Component Value Date   WBC 5.8 05/05/2017   HGB 14.8 05/05/2017   HCT 43.7 05/05/2017   MCV 91 05/05/2017   PLT 246 05/05/2017   NEUTROABS 3.4 05/05/2017    ASSESSMENT & PLAN:  Breast cancer of lower-outer quadrant of right female breast ([place]) Right breast cancer T3 N1 M0 stage IIIa invasive ductal carcinoma ER/PR positive HER-2 negative status post mastectomy December 2012, followed by 6 cycles of TAC completed June 2013 followed by radiation July 2013, started tamoxifen September 2013, started Zoladex January 2014, discontinued all antiestrogen therapy January 2015  Tamoxifen toxicities: Profound anasarca and severe weight gain from fluid buildup Lymphedema management: Patient undergoes manual lymphedema drainage procedures every day as well as sleep and lymphedema pumps to help with her lymphedema issues. Current treatment of swelling: Lasix 20 by mouth twice a day, phentermine, Adipex  Menopausal symptoms: Patient is currently receivingestrogen replacement therapy for the menopausal symptoms. She is fully aware that she is risking breast cancer relapse by taking estrogen replacement therapy.  Breast Cancer Surveillance: 1. Breast exam  08/25/2017: Right mastectomy, no palpable abnormalities, lymphedema 2. Mammogram left breast: 08/24/2017: No findings suspicious for malignancy. Breast density category C  Patient is complaining of headaches back pain: We will obtain CT head chest abdomen pelvis with contrast for further evaluation. I will call her with results of these tests.  Complaining of headaches as well as diffuse body aches and pains especially back pain. Return to clinic in 1 year for surveillance and follow-up    No orders of the defined types were placed in this encounter.  The patient has a good understanding of the overall plan. she agrees with it. she will call with any problems that may develop  before the next visit here.   Harriette Ohara, MD 08/25/17

## 2017-08-24 NOTE — Assessment & Plan Note (Signed)
Right breast cancer T3 N1 M0 stage IIIa invasive ductal carcinoma ER/PR positive HER-2 negative status post mastectomy December 2012, followed by 6 cycles of TAC completed June 2013 followed by radiation July 2013, started tamoxifen September 2013, started Zoladex January 2014, discontinued all antiestrogen therapy January 2015  Tamoxifen toxicities: Profound anasarca and severe weight gain from fluid buildup Lymphedema management: Patient undergoes manual lymphedema drainage procedures every day as well as sleep and lymphedema pumps to help with her lymphedema issues. Current treatment of swelling: Lasix 20 by mouth twice a day, phentermine, Adipex  Menopausal symptoms: Patient is currently receivingestrogen replacement therapy for the menopausal symptoms. She is fully aware that she is risking breast cancer relapse by taking estrogen replacement therapy.  Breast Cancer Surveillance: 1. Breast exam  08/25/2017: Right mastectomy, no palpable abnormalities, lymphedema 2. Mammogram left breast: 08/24/2017: No findings suspicious for malignancy. Breast density category C  Return to clinic in 1 year for surveillance and follow-up

## 2017-08-25 ENCOUNTER — Encounter: Payer: Self-pay | Admitting: Hematology and Oncology

## 2017-08-25 ENCOUNTER — Other Ambulatory Visit: Payer: Self-pay

## 2017-08-25 ENCOUNTER — Inpatient Hospital Stay: Payer: BLUE CROSS/BLUE SHIELD | Attending: Hematology and Oncology | Admitting: Hematology and Oncology

## 2017-08-25 ENCOUNTER — Telehealth: Payer: Self-pay

## 2017-08-25 ENCOUNTER — Telehealth: Payer: Self-pay | Admitting: Hematology and Oncology

## 2017-08-25 DIAGNOSIS — Z170 Estrogen receptor positive status [ER+]: Secondary | ICD-10-CM | POA: Diagnosis not present

## 2017-08-25 DIAGNOSIS — C50511 Malignant neoplasm of lower-outer quadrant of right female breast: Secondary | ICD-10-CM | POA: Diagnosis not present

## 2017-08-25 DIAGNOSIS — N951 Menopausal and female climacteric states: Secondary | ICD-10-CM | POA: Diagnosis not present

## 2017-08-25 DIAGNOSIS — Z79810 Long term (current) use of selective estrogen receptor modulators (SERMs): Secondary | ICD-10-CM | POA: Insufficient documentation

## 2017-08-25 DIAGNOSIS — R51 Headache: Secondary | ICD-10-CM | POA: Diagnosis not present

## 2017-08-25 DIAGNOSIS — Z9011 Acquired absence of right breast and nipple: Secondary | ICD-10-CM | POA: Diagnosis not present

## 2017-08-25 DIAGNOSIS — M549 Dorsalgia, unspecified: Secondary | ICD-10-CM | POA: Diagnosis not present

## 2017-08-25 DIAGNOSIS — R601 Generalized edema: Secondary | ICD-10-CM | POA: Diagnosis not present

## 2017-08-25 DIAGNOSIS — Z9221 Personal history of antineoplastic chemotherapy: Secondary | ICD-10-CM

## 2017-08-25 DIAGNOSIS — Z923 Personal history of irradiation: Secondary | ICD-10-CM

## 2017-08-25 DIAGNOSIS — I890 Lymphedema, not elsewhere classified: Secondary | ICD-10-CM

## 2017-08-25 NOTE — Progress Notes (Signed)
PET

## 2017-08-25 NOTE — Telephone Encounter (Signed)
Patient declined avs and calendar  °

## 2017-08-25 NOTE — Telephone Encounter (Signed)
Called pt to confirm her appt with PET SCAN. Pt verbalized understanding and has no further questions. Pt to remain npo 6hrs prior to exam.

## 2017-08-31 ENCOUNTER — Ambulatory Visit (HOSPITAL_COMMUNITY): Admission: RE | Admit: 2017-08-31 | Payer: Self-pay | Source: Ambulatory Visit

## 2017-09-02 ENCOUNTER — Ambulatory Visit (HOSPITAL_COMMUNITY)
Admission: RE | Admit: 2017-09-02 | Discharge: 2017-09-02 | Disposition: A | Discharge status: Home / Self Care | Payer: BLUE CROSS/BLUE SHIELD | Source: Ambulatory Visit | Attending: Hematology and Oncology | Admitting: Hematology and Oncology

## 2017-09-02 DIAGNOSIS — C50511 Malignant neoplasm of lower-outer quadrant of right female breast: Secondary | ICD-10-CM | POA: Insufficient documentation

## 2017-09-02 LAB — GLUCOSE, CAPILLARY: GLUCOSE-CAPILLARY: 105 mg/dL — AB (ref 65–99)

## 2017-09-02 MED ORDER — FLUDEOXYGLUCOSE F - 18 (FDG) INJECTION
11.0000 | Freq: Once | INTRAVENOUS | Status: AC
  Administered 2017-09-02: 11 via INTRAVENOUS

## 2017-09-08 ENCOUNTER — Telehealth: Payer: Self-pay | Admitting: *Deleted

## 2017-09-08 NOTE — Telephone Encounter (Signed)
Faxed ROI to [facility]; release [phone]

## 2017-10-06 ENCOUNTER — Ambulatory Visit: Payer: BLUE CROSS/BLUE SHIELD | Admitting: Psychology

## 2017-11-02 ENCOUNTER — Ambulatory Visit: Payer: BLUE CROSS/BLUE SHIELD | Admitting: Psychology

## 2017-11-02 DIAGNOSIS — F330 Major depressive disorder, recurrent, mild: Secondary | ICD-10-CM | POA: Diagnosis not present

## 2017-11-09 ENCOUNTER — Encounter: Payer: Self-pay | Admitting: Physician Assistant

## 2017-11-09 DIAGNOSIS — E279 Disorder of adrenal gland, unspecified: Secondary | ICD-10-CM

## 2017-11-09 DIAGNOSIS — R7303 Prediabetes: Secondary | ICD-10-CM

## 2017-11-09 DIAGNOSIS — E7800 Pure hypercholesterolemia, unspecified: Secondary | ICD-10-CM

## 2017-11-09 DIAGNOSIS — Z114 Encounter for screening for human immunodeficiency virus [HIV]: Secondary | ICD-10-CM

## 2017-11-09 DIAGNOSIS — E876 Hypokalemia: Secondary | ICD-10-CM

## 2017-11-17 ENCOUNTER — Encounter: Payer: BLUE CROSS/BLUE SHIELD | Admitting: Physician Assistant

## 2017-11-17 ENCOUNTER — Other Ambulatory Visit: Payer: Self-pay | Admitting: Physician Assistant

## 2017-11-17 ENCOUNTER — Encounter: Payer: Self-pay | Admitting: Physician Assistant

## 2017-11-17 DIAGNOSIS — R634 Abnormal weight loss: Secondary | ICD-10-CM

## 2017-11-17 DIAGNOSIS — M5030 Other cervical disc degeneration, unspecified cervical region: Secondary | ICD-10-CM

## 2017-11-18 MED ORDER — OXYCODONE-ACETAMINOPHEN 5-325 MG PO TABS: 1.0000 | ORAL_TABLET | Freq: Four times a day (QID) | ORAL | 0 refills | Status: DC | PRN

## 2017-11-18 NOTE — Telephone Encounter (Signed)
Please review for Jenni.   Thanks,   -Laura  

## 2017-11-19 ENCOUNTER — Telehealth: Payer: Self-pay

## 2017-11-19 LAB — CBC WITH DIFFERENTIAL/PLATELET
BASOS: 1 %
Basophils Absolute: 0 10*3/uL (ref 0.0–0.2)
EOS (ABSOLUTE): 0.2 10*3/uL (ref 0.0–0.4)
EOS: 4 %
Hematocrit: 43.6 % (ref 34.0–46.6)
Hemoglobin: 13.9 g/dL (ref 11.1–15.9)
IMMATURE GRANS (ABS): 0 10*3/uL (ref 0.0–0.1)
IMMATURE GRANULOCYTES: 0 %
LYMPHS: 30 %
Lymphocytes Absolute: 1.6 10*3/uL (ref 0.7–3.1)
MCH: 28.1 pg (ref 26.6–33.0)
MCHC: 31.9 g/dL (ref 31.5–35.7)
MCV: 88 fL (ref 79–97)
MONOCYTES: 8 %
Monocytes Absolute: 0.4 10*3/uL (ref 0.1–0.9)
NEUTROS PCT: 57 %
Neutrophils Absolute: 3 10*3/uL (ref 1.4–7.0)
Platelets: 258 10*3/uL (ref 150–450)
RBC: 4.95 x10E6/uL (ref 3.77–5.28)
RDW: 13.5 % (ref 12.3–15.4)
WBC: 5.3 10*3/uL (ref 3.4–10.8)

## 2017-11-19 LAB — COMPREHENSIVE METABOLIC PANEL
A/G RATIO: 1.9 (ref 1.2–2.2)
ALBUMIN: 4.6 g/dL (ref 3.5–5.5)
ALT: 20 IU/L (ref 0–32)
AST: 18 IU/L (ref 0–40)
Alkaline Phosphatase: 76 IU/L (ref 39–117)
BUN / CREAT RATIO: 17 (ref 9–23)
BUN: 16 mg/dL (ref 6–24)
Bilirubin Total: 0.3 mg/dL (ref 0.0–1.2)
CALCIUM: 9.4 mg/dL (ref 8.7–10.2)
CO2: 21 mmol/L (ref 20–29)
CREATININE: 0.93 mg/dL (ref 0.57–1.00)
Chloride: 102 mmol/L (ref 96–106)
GFR calc Af Amer: 82 mL/min/{1.73_m2} (ref >59)
GFR calc non Af Amer: 71 mL/min/{1.73_m2} (ref >59)
GLOBULIN, TOTAL: 2.4 g/dL (ref 1.5–4.5)
GLUCOSE: 98 mg/dL (ref 65–99)
POTASSIUM: 4.5 mmol/L (ref 3.5–5.2)
SODIUM: 140 mmol/L (ref 134–144)
Total Protein: 7 g/dL (ref 6.0–8.5)

## 2017-11-19 LAB — 17-HYDROXYPROGESTERONE

## 2017-11-19 LAB — LIPID PANEL
Chol/HDL Ratio: 4.2 ratio (ref 0.0–4.4)
Cholesterol, Total: 214 mg/dL — ABNORMAL HIGH (ref 100–199)
HDL: 51 mg/dL (ref >39)
LDL Calculated: 127 mg/dL — ABNORMAL HIGH (ref 0–99)
TRIGLYCERIDES: 178 mg/dL — AB (ref 0–149)
VLDL Cholesterol Cal: 36 mg/dL (ref 5–40)

## 2017-11-19 LAB — HIV ANTIBODY (ROUTINE TESTING W REFLEX): HIV Screen 4th Generation wRfx: NONREACTIVE

## 2017-11-19 LAB — HEMOGLOBIN A1C
Est. average glucose Bld gHb Est-mCnc: 120 mg/dL
Hgb A1c MFr Bld: 5.8 % — ABNORMAL HIGH (ref 4.8–5.6)

## 2017-11-19 LAB — THYROID PANEL WITH TSH
Free Thyroxine Index: 1.8 (ref 1.2–4.9)
T3 Uptake Ratio: 21 % — ABNORMAL LOW (ref 24–39)
T4 TOTAL: 8.7 ug/dL (ref 4.5–12.0)
TSH: 3.04 u[IU]/mL (ref 0.450–4.500)

## 2017-11-19 LAB — ESTROGENS, TOTAL: ESTROGEN: 146 pg/mL

## 2017-11-19 NOTE — Telephone Encounter (Signed)
I can't do an actual result note if the results aren't in my box.  I can do a phone note with results, but then it won't be on mychart.  Otherwise, you can forward me the results from Jenni's box and I can result them.  Virginia Crews, MD, MPH [facility] 11/19/2017 2:52 PM

## 2017-11-19 NOTE — Telephone Encounter (Signed)
Patient is requesting if her labs are in. Reports that she usually get this results through mychart with a message from [place] but that she has not receive any information. Can you results those notes for the patient?  Thanks,  -Joseline

## 2017-11-20 ENCOUNTER — Encounter: Payer: Self-pay | Admitting: Physician Assistant

## 2017-11-20 ENCOUNTER — Encounter: Payer: Self-pay | Admitting: Family Medicine

## 2017-11-23 ENCOUNTER — Encounter: Payer: Self-pay | Admitting: Physician Assistant

## 2017-11-23 MED ORDER — PHENTERMINE HCL 15 MG PO CAPS: 15.0000 mg | ORAL_CAPSULE | ORAL | 2 refills | Status: DC

## 2017-11-24 ENCOUNTER — Telehealth: Payer: Self-pay

## 2017-11-24 NOTE — Telephone Encounter (Signed)
-----   Message from Mar Daring, PA-C sent at 11/24/2017 10:14 AM EDT ----- Cholesterol and A1c increased from last year. All other labs are normal and stable. Continue working on healty lifestyle modifications as you were previously.

## 2017-11-24 NOTE — Telephone Encounter (Signed)
Patient was advised by Dr.B by Deloris Ping message.

## 2017-12-03 ENCOUNTER — Encounter: Payer: BLUE CROSS/BLUE SHIELD | Admitting: Physician Assistant

## 2017-12-14 ENCOUNTER — Ambulatory Visit: Payer: BLUE CROSS/BLUE SHIELD | Admitting: Psychology

## 2017-12-21 ENCOUNTER — Encounter: Payer: Self-pay | Admitting: Physician Assistant

## 2017-12-21 ENCOUNTER — Ambulatory Visit (INDEPENDENT_AMBULATORY_CARE_PROVIDER_SITE_OTHER): Payer: BLUE CROSS/BLUE SHIELD | Admitting: Physician Assistant

## 2017-12-21 VITALS — BP 130/80 | HR 118 | Temp 98.0°F | Resp 16 | Ht 65.0 in | Wt 215.2 lb

## 2017-12-21 DIAGNOSIS — C50511 Malignant neoplasm of lower-outer quadrant of right female breast: Secondary | ICD-10-CM | POA: Diagnosis not present

## 2017-12-21 DIAGNOSIS — Z170 Estrogen receptor positive status [ER+]: Secondary | ICD-10-CM

## 2017-12-21 DIAGNOSIS — Z6835 Body mass index (BMI) 35.0-35.9, adult: Secondary | ICD-10-CM

## 2017-12-21 DIAGNOSIS — Z0000 Encounter for general adult medical examination without abnormal findings: Secondary | ICD-10-CM

## 2017-12-21 DIAGNOSIS — Z2821 Immunization not carried out because of patient refusal: Secondary | ICD-10-CM

## 2017-12-21 DIAGNOSIS — G43109 Migraine with aura, not intractable, without status migrainosus: Secondary | ICD-10-CM

## 2017-12-21 DIAGNOSIS — I972 Postmastectomy lymphedema syndrome: Secondary | ICD-10-CM

## 2017-12-21 MED ORDER — PHENTERMINE HCL 37.5 MG PO TABS: 18.7500 mg | ORAL_TABLET | Freq: Every day | ORAL | 2 refills | Status: DC

## 2017-12-21 MED ORDER — BUTALBITAL-APAP-CAFFEINE 50-325-40 MG PO TABS: 1.0000 | ORAL_TABLET | Freq: Four times a day (QID) | ORAL | 5 refills | Status: AC | PRN

## 2017-12-21 NOTE — Progress Notes (Signed)
Patient: Sabrina Morris, Female    DOB: 05/18/1965, 52 y.o.   MRN: [id number] Visit Date: 12/21/2017  Today's Provider: Mar Daring, PA-C   Chief Complaint  Patient presents with  . Annual Exam   Subjective:    Annual physical exam Sabrina Morris is a 52 y.o. female who presents today for health maintenance and complete physical. She feels well. She reports exercising none. She reports she is sleeping fairly well.  Mammogram:08/24/17 BI-RADS 2:Benign Pap: 05/29/16-Negative, HPV-Negative Labs:11/13/17 -----------------------------------------------------------------   Review of Systems  Constitutional: Positive for fatigue.  HENT: Positive for facial swelling ("Lymphedema").   Eyes: Negative.   Respiratory: Negative.   Cardiovascular: Positive for leg swelling ("Lymphedema").  Gastrointestinal: Negative.   Endocrine: Positive for heat intolerance.  Genitourinary: Negative.   Musculoskeletal: Positive for arthralgias, back pain, neck pain ("Lymphedema") and neck stiffness ("Lymphedema").  Skin: Negative.   Allergic/Immunologic: Negative.   Neurological: Positive for numbness and headaches ("Migraines").  Hematological: Negative.   Psychiatric/Behavioral: Positive for sleep disturbance.    Social History      She  reports that she has never smoked. She has never used smokeless tobacco. She reports that she does not drink alcohol or use drugs.       Social History   Socioeconomic History  . Marital status: Single    Spouse name: Not on file  . Number of children: Not on file  . Years of education: Not on file  . Highest education level: Not on file  Occupational History  . Occupation: Programmer, multimedia: [employer]    Comment: Full Time  Social Needs  . Financial resource strain: Not on file  . Food insecurity:    Worry: Not on file    Inability: Not on file  . Transportation needs:    Medical: Not on file    Non-medical: Not  on file  Tobacco Use  . Smoking status: Never Smoker  . Smokeless tobacco: Never Used  Substance and Sexual Activity  . Alcohol use: No  . Drug use: No  . Sexual activity: Never  Lifestyle  . Physical activity:    Days per week: 0 days    Minutes per session: Not on file  . Stress: Not on file  Relationships  . Social connections:    Talks on phone: Not on file    Gets together: Not on file    Attends religious service: Not on file    Active member of club or organization: Not on file    Attends meetings of clubs or organizations: Not on file    Relationship status: Not on file  Other Topics Concern  . Not on file  Social History Narrative  . Not on file    Past Medical History:  Diagnosis Date  . Adrenal disorder ([place])   . Breast cancer ([place])   . H/O bone density study 2015  . H/O colonoscopy    sch 06/12/15  . Lymphedema   . Lymphedema   . Migraine   . Pap smear for cervical cancer screening [id number]     Patient Active Problem List   Diagnosis Date Noted  . Adrenal disorder ([place]) 05/05/2017  . Postmenopausal 07/02/2015  . Lymphedema of face 06/27/2015  . Hypokalemia 11/30/2014  . Accumulation of fluid in tissues 10/03/2014  . Hypercholesteremia 10/03/2014  . Acquired lymphedema 10/03/2014  . Menopausal and perimenopausal disorder 10/03/2014  . Borderline diabetes 10/03/2014  . Abnormal  weight gain 10/03/2014  . Ventricular pre-excitation with arrhythmia 10/03/2014  . GERD (gastroesophageal reflux disease) 09/22/2014  . Anxiety 09/11/2014  . 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 Lymphedema of leg 01/04/2014  . History of asthma 10/29/2011  . Menopause praecox 09/10/2011  . Post-lymphadenectomy lymphedema of arm 09/10/2011  . Breast cancer of lower-outer quadrant of right female breast ([place]) 01/01/2011  . Headache, migraine 06/18/2006    Past Surgical History:  Procedure Laterality Date  . breast  Left 02/18/2017   Implant removed  . BREAST  ENHANCEMENT SURGERY Bilateral 2005  . CARDIAC CATHETERIZATION  1994  . CARDIAC CATHETERIZATION  1994  . MASTECTOMY Right 02/2011  . PORT A CATH INJECTION ([place] [facility])  2013    Family History        Family Status  Relation Name Status  . Sister  (Not Specified)        Her family history includes Breast cancer (age of onset: 73) in her sister. She was adopted.      Allergies  Allergen Reactions  . Sumatriptan Other (See Comments) and Shortness Of Breath  . Prometrium [Progesterone Micronized] Other (See Comments)    dizziness  . Phentermine-Topiramate Other (See Comments)  . Sulfa Antibiotics Other (See Comments)    GI distress  . Sulfamethoxazole Other (See Comments)  . Tamoxifen Other (See Comments)    Edema, excess fluid  . Zoladex  [Goserelin]   . Hydrocodone-Acetaminophen Rash    Hyperactivity     Current Outpatient Medications:  .  albuterol (PROVENTIL HFA;VENTOLIN HFA) 108 (90 Base) MCG/ACT inhaler, Inhale 1-2 puffs into the lungs every 6 (six) hours as needed for wheezing or shortness of breath., Disp: 1 Inhaler, Rfl: 3 .  calcium carbonate 100 mg/ml SUSP, Take by mouth., Disp: , Rfl:  .  Cholecalciferol (VITAMIN D3) 5000 units TABS, Take by mouth., Disp: , Rfl:  .  cyclobenzaprine (FLEXERIL) 5 MG tablet, Take 1 tablet (5 mg total) by mouth 3 (three) times daily as needed., Disp: 90 tablet, Rfl: 1 .  docusate sodium (STOOL SOFTENER) 100 MG capsule, Take by mouth., Disp: , Rfl:  .  estradiol (ESTRACE) 0.5 MG tablet, , Disp: , Rfl: 2 .  fluocinonide (LIDEX) 0.05 % external solution, Apply 1 application topically 2 (two) times daily., Disp: 60 mL, Rfl: 3 .  Fluticasone-Salmeterol (ADVAIR) 250-50 MCG/DOSE AEPB, Inhale 1 puff into the lungs 2 (two) times daily., Disp: 60 each, Rfl: 5 .  furosemide (LASIX) 20 MG tablet, TAKE 2 TABLETS BY MOUTH DAILY, Disp: 60 tablet, Rfl: 5 .  ibuprofen (ADVIL,MOTRIN) 600 MG tablet, Take 1 tablet (600 mg total) by mouth every 8 (eight) hours  as needed., Disp: 360 tablet, Rfl: 1 .  LORazepam (ATIVAN) 1 MG tablet, TAKE 1 TABLET BY MOUTH TWICE A DAY AS NEEDED., Disp: 60 tablet, Rfl: 5 .  Multiple Vitamin (MULTIVITAMIN) capsule, Take by mouth., Disp: , Rfl:  .  oxyCODONE-acetaminophen (PERCOCET/ROXICET) 5-325 MG tablet, Take 1-2 tablets by mouth every 6 (six) hours as needed for severe pain., Disp: 30 tablet, Rfl: 0 .  phentermine 15 MG capsule, Take 1-2 capsules (15-30 mg total) by mouth every morning., Disp: 60 capsule, Rfl: 2 .  potassium chloride (K-DUR) 10 MEQ tablet, TAKE 1 TABLET BY MOUTH DAILY, Disp: 90 tablet, Rfl: 1 .  progesterone (PROMETRIUM) 100 MG capsule, Take 100 mg by mouth., Disp: , Rfl:  .  promethazine (PHENERGAN) 25 MG tablet, Take 1 tablet (25 mg total) by mouth every 8 (  eight) hours as needed for nausea or vomiting., Disp: 20 tablet, Rfl: 0 .  rizatriptan (MAXALT) 10 MG tablet, TAKE 1 TABLET AT FIRST SIGN OF MIGRAINE SYMPTOMS. IF NO RELIEF, A SECOND TABLET MAY BE TAKEN IN 2 HOURS. LIMIT 2 TABLETSPER DAY AND 5 TABLET, Disp: 15 tablet, Rfl: 3 .  valACYclovir (VALTREX) 500 MG tablet, Take 1 tablet (500 mg total) by mouth 2 (two) times daily. Prn fever blisters, Disp: 30 tablet, Rfl: 5 .  potassium chloride (K-DUR,KLOR-CON) 10 MEQ tablet, TAKE 1 TABLET EVERY DAY, Disp: 90 tablet, Rfl: 1 .  predniSONE (DELTASONE) 10 MG tablet, Take 6 tabs PO on day 1&2, 5 tabs PO on day 3&4, 4 tabs PO on day 5&6, 3 tabs PO on day 7&8, 2 tabs PO on day 9&10, 1 tab PO on day 11&12. (Patient not taking: Reported on 06/22/2017), Disp: 42 tablet, Rfl: 0   Patient Care Team: Mar Daring, PA-C as PCP - General (Family Medicine)      Objective:   Vitals: BP 130/80 (BP Location: Left Arm, Patient Position: Sitting, Cuff Size: Normal)   Pulse (!) 118   Temp 98 F (36.7 C) (Oral)   Resp 16   Ht 5\' 5"  (1.651 m)   Wt 215 lb 3.2 oz (97.6 kg)   BMI 35.81 kg/m    Vitals:   12/21/17 1417  BP: 130/80  Pulse: (!) 118  Resp: 16  Temp:  98 F (36.7 C)  TempSrc: Oral  Weight: 215 lb 3.2 oz (97.6 kg)  Height: 5\' 5"  (1.651 m)     Physical Exam  Constitutional: She is oriented to person, place, and time. She appears well-developed and well-nourished. No distress.  HENT:  Head: Normocephalic and atraumatic.  Right Ear: External ear normal.  Left Ear: External ear normal.  Nose: Nose normal.  Mouth/Throat: Oropharynx is clear and moist. No oropharyngeal exudate.  Eyes: Pupils are equal, round, and reactive to light. Conjunctivae and EOM are normal. Right eye exhibits no discharge. Left eye exhibits no discharge. No scleral icterus.  Neck: Normal range of motion. Neck supple. No JVD present. No tracheal deviation present. No thyromegaly present.  Cardiovascular: Normal rate, regular rhythm, normal heart sounds and intact distal pulses. Exam reveals no gallop and no friction rub.  No murmur heard. Pulmonary/Chest: Effort normal and breath sounds normal. No respiratory distress. She has no wheezes. She has no rales. She exhibits no tenderness.  Abdominal: Soft. Bowel sounds are normal. She exhibits no distension and no mass. There is no tenderness. There is no rebound and no guarding.  Musculoskeletal: Normal range of motion. She exhibits no edema or tenderness.  Lymphadenopathy:    She has no cervical adenopathy.  Neurological: She is alert and oriented to person, place, and time.  Skin: Skin is warm and dry. No rash noted. She is not diaphoretic.  Psychiatric: She has a normal mood and affect. Her behavior is normal. Judgment and thought content normal.  Vitals reviewed.   Depression Screen PHQ 2/9 Scores 12/21/2017 05/19/2016 04/20/2015  PHQ - 2 Score 0 0 0  PHQ- 9 Score - 2 -     Assessment & Plan:     Routine Health Maintenance and Physical Exam  Exercise Activities and Dietary recommendations Goals   None      There is no immunization history on file for this patient.  Health Maintenance  Topic Date Due    . TETANUS/TDAP  12/21/1983  . INFLUENZA VACCINE  10/15/2017  .  MAMMOGRAM  07/15/2018  . PAP SMEAR  05/30/2019  . COLONOSCOPY  06/11/2025  . HIV Screening  Completed     Discussed health benefits of physical activity, and encouraged her to engage in regular exercise appropriate for her age and condition.    1. Annual physical exam Normal exam today.   2. Migraine with aura and without status migrainosus, not intractable Add fioricet to use first line for migraines, then can use maxalt for resistant migraines.  - butalbital-acetaminophen-caffeine (FIORICET, ESGIC) 50-325-40 MG tablet; Take 1-2 tablets by mouth every 6 (six) hours as needed for headache.  Dispense: 60 tablet; Refill: 5  3. BMI 35.0-35.9,adult Counseled patient on healthy lifestyle modifications including dieting and exercise.  - phentermine (ADIPEX-P) 37.5 MG tablet; Take 0.5-1 tablets (18.75-37.5 mg total) by mouth daily before breakfast.  Dispense: 30 tablet; Refill: 2  4. Malignant neoplasm of lower-outer quadrant of right breast of female, estrogen receptor positive ([place]) Followed by Oncology.  5. 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   6. Influenza vaccination declined  --------------------------------------------------------------------    Mar Daring, PA-C  [facility]

## 2018-01-11 ENCOUNTER — Ambulatory Visit: Payer: BLUE CROSS/BLUE SHIELD | Admitting: Psychology

## 2018-01-11 DIAGNOSIS — F330 Major depressive disorder, recurrent, mild: Secondary | ICD-10-CM

## 2018-01-14 ENCOUNTER — Encounter: Payer: Self-pay | Admitting: Physician Assistant

## 2018-01-18 ENCOUNTER — Other Ambulatory Visit: Payer: Self-pay | Admitting: Physician Assistant

## 2018-01-18 DIAGNOSIS — G479 Sleep disorder, unspecified: Secondary | ICD-10-CM

## 2018-01-29 ENCOUNTER — Other Ambulatory Visit: Payer: Self-pay | Admitting: Physician Assistant

## 2018-02-01 ENCOUNTER — Other Ambulatory Visit: Payer: Self-pay | Admitting: Physician Assistant

## 2018-02-01 DIAGNOSIS — G43109 Migraine with aura, not intractable, without status migrainosus: Secondary | ICD-10-CM

## 2018-02-08 ENCOUNTER — Ambulatory Visit: Payer: BLUE CROSS/BLUE SHIELD | Admitting: Psychology

## 2018-02-10 ENCOUNTER — Other Ambulatory Visit: Payer: Self-pay | Admitting: Physician Assistant

## 2018-02-10 DIAGNOSIS — I890 Lymphedema, not elsewhere classified: Secondary | ICD-10-CM

## 2018-02-15 ENCOUNTER — Encounter: Payer: Self-pay | Admitting: Physician Assistant

## 2018-02-15 DIAGNOSIS — J40 Bronchitis, not specified as acute or chronic: Secondary | ICD-10-CM

## 2018-02-15 MED ORDER — FLUTICASONE-SALMETEROL 250-50 MCG/DOSE IN AEPB: 1.0000 | INHALATION_SPRAY | Freq: Two times a day (BID) | RESPIRATORY_TRACT | 5 refills | Status: DC

## 2018-02-15 MED ORDER — ALBUTEROL SULFATE HFA 108 (90 BASE) MCG/ACT IN AERS: 1.0000 | INHALATION_SPRAY | Freq: Four times a day (QID) | RESPIRATORY_TRACT | 3 refills | Status: DC | PRN

## 2018-03-01 ENCOUNTER — Ambulatory Visit: Payer: BLUE CROSS/BLUE SHIELD | Admitting: Psychology

## 2018-03-19 ENCOUNTER — Other Ambulatory Visit: Payer: Self-pay | Admitting: Physician Assistant

## 2018-03-19 DIAGNOSIS — F419 Anxiety disorder, unspecified: Secondary | ICD-10-CM

## 2018-04-13 ENCOUNTER — Ambulatory Visit: Payer: Self-pay | Admitting: Psychology

## 2018-04-17 ENCOUNTER — Encounter: Payer: Self-pay | Admitting: Physician Assistant

## 2018-04-17 DIAGNOSIS — J40 Bronchitis, not specified as acute or chronic: Secondary | ICD-10-CM

## 2018-04-19 MED ORDER — AMOXICILLIN-POT CLAVULANATE 875-125 MG PO TABS: 1.0000 | ORAL_TABLET | Freq: Two times a day (BID) | ORAL | 0 refills | Status: DC

## 2018-05-03 ENCOUNTER — Ambulatory Visit (INDEPENDENT_AMBULATORY_CARE_PROVIDER_SITE_OTHER): Payer: Medicare Other | Admitting: Psychology

## 2018-05-03 DIAGNOSIS — F330 Major depressive disorder, recurrent, mild: Secondary | ICD-10-CM | POA: Diagnosis not present

## 2018-05-07 NOTE — Progress Notes (Deleted)
Patient: Sabrina Morris Female    DOB: [birth year]   53 y.o.   MRN: [id number] Visit Date: 05/07/2018  Today's Provider: Mar Daring, PA-C   No chief complaint on file.  Subjective:     HPI  Allergies  Allergen Reactions  . Sumatriptan Other (See Comments) and Shortness Of Breath  . Prometrium [Progesterone Micronized] Other (See Comments)    dizziness  . Phentermine-Topiramate Other (See Comments)  . Sulfa Antibiotics Other (See Comments)    GI distress  . Sulfamethoxazole Other (See Comments)  . Tamoxifen Other (See Comments)    Edema, excess fluid  . Zoladex  [Goserelin]   . Hydrocodone-Acetaminophen Rash    Hyperactivity     Current Outpatient Medications:  .  albuterol (PROVENTIL HFA;VENTOLIN HFA) 108 (90 Base) MCG/ACT inhaler, Inhale 1-2 puffs into the lungs every 6 (six) hours as needed for wheezing or shortness of breath., Disp: 1 Inhaler, Rfl: 3 .  amoxicillin-clavulanate (AUGMENTIN) 875-125 MG tablet, Take 1 tablet by mouth 2 (two) times daily., Disp: 20 tablet, Rfl: 0 .  butalbital-acetaminophen-caffeine (FIORICET, ESGIC) 50-325-40 MG tablet, Take 1-2 tablets by mouth every 6 (six) hours as needed for headache., Disp: 60 tablet, Rfl: 5 .  calcium carbonate 100 mg/ml SUSP, Take by mouth., Disp: , Rfl:  .  Cholecalciferol (VITAMIN D3) 5000 units TABS, Take by mouth., Disp: , Rfl:  .  cyclobenzaprine (FLEXERIL) 5 MG tablet, TAKE 1 TABLET BY MOUTH THREE TIMES DAILYAS NEEDED, Disp: 90 tablet, Rfl: 1 .  docusate sodium (STOOL SOFTENER) 100 MG capsule, Take by mouth., Disp: , Rfl:  .  estradiol (ESTRACE) 0.5 MG tablet, , Disp: , Rfl: 2 .  fluocinonide (LIDEX) 0.05 % external solution, Apply 1 application topically 2 (two) times daily., Disp: 60 mL, Rfl: 3 .  Fluticasone-Salmeterol (ADVAIR) 250-50 MCG/DOSE AEPB, Inhale 1 puff into the lungs 2 (two) times daily., Disp: 60 each, Rfl: 5 .  furosemide (LASIX) 20 MG tablet, TAKE 2 TABLETS BY MOUTH DAILY,  Disp: 60 tablet, Rfl: 5 .  ibuprofen (ADVIL,MOTRIN) 600 MG tablet, Take 1 tablet (600 mg total) by mouth every 8 (eight) hours as needed., Disp: 360 tablet, Rfl: 1 .  LORazepam (ATIVAN) 1 MG tablet, TAKE 1 TABLET BY MOUTH TWICE A DAY AS NEEDED., Disp: 60 tablet, Rfl: 5 .  Multiple Vitamin (MULTIVITAMIN) capsule, Take by mouth., Disp: , Rfl:  .  oxyCODONE-acetaminophen (PERCOCET/ROXICET) 5-325 MG tablet, Take 1-2 tablets by mouth every 6 (six) hours as needed for severe pain., Disp: 30 tablet, Rfl: 0 .  phentermine (ADIPEX-P) 37.5 MG tablet, Take 0.5-1 tablets (18.75-37.5 mg total) by mouth daily before breakfast., Disp: 30 tablet, Rfl: 2 .  potassium chloride (K-DUR) 10 MEQ tablet, TAKE 1 TABLET BY MOUTH DAILY, Disp: 90 tablet, Rfl: 1 .  potassium chloride (K-DUR,KLOR-CON) 10 MEQ tablet, TAKE 1 TABLET EVERY DAY, Disp: 90 tablet, Rfl: 1 .  progesterone (PROMETRIUM) 100 MG capsule, Take 100 mg by mouth., Disp: , Rfl:  .  promethazine (PHENERGAN) 25 MG tablet, Take 1 tablet (25 mg total) by mouth every 8 (eight) hours as needed for nausea or vomiting., Disp: 20 tablet, Rfl: 0 .  rizatriptan (MAXALT) 10 MG tablet, TAKE 1 TABLET AT FIRST SIGN OF MIGRAINE SYMPTOMS. IF NO RELIEF, A SECOND TABLET MAY BE TAKEN IN 2 HOURS. LIMIT 2 TABLETSPER DAY AND 5 TABLET, Disp: 15 tablet, Rfl: 3 .  temazepam (RESTORIL) 15 MG capsule, TAKE 1 TO 2 CAPSULES BY MOUTH  AT [facility] NEEDED FOR SLEEP, Disp: 60 capsule, Rfl: 3 .  valACYclovir (VALTREX) 500 MG tablet, Take 1 tablet (500 mg total) by mouth 2 (two) times daily. Prn fever blisters, Disp: 30 tablet, Rfl: 5  Review of Systems  Social History   Tobacco Use  . Smoking status: Never Smoker  . Smokeless tobacco: Never Used  Substance Use Topics  . Alcohol use: No      Objective:   There were no vitals taken for this visit. There were no vitals filed for this visit.   Physical Exam      Assessment & [place], PA-C  [facility]

## 2018-05-10 ENCOUNTER — Ambulatory Visit (INDEPENDENT_AMBULATORY_CARE_PROVIDER_SITE_OTHER): Payer: Medicare Other | Admitting: Physician Assistant

## 2018-05-10 ENCOUNTER — Encounter: Payer: Self-pay | Admitting: Physician Assistant

## 2018-05-10 ENCOUNTER — Ambulatory Visit: Payer: Self-pay | Admitting: Physician Assistant

## 2018-05-10 VITALS — BP 146/81 | HR 111 | Temp 98.0°F | Resp 16 | Wt 213.0 lb

## 2018-05-10 DIAGNOSIS — T3695XA Adverse effect of unspecified systemic antibiotic, initial encounter: Secondary | ICD-10-CM

## 2018-05-10 DIAGNOSIS — J4541 Moderate persistent asthma with (acute) exacerbation: Secondary | ICD-10-CM

## 2018-05-10 DIAGNOSIS — M5030 Other cervical disc degeneration, unspecified cervical region: Secondary | ICD-10-CM | POA: Diagnosis not present

## 2018-05-10 DIAGNOSIS — B379 Candidiasis, unspecified: Secondary | ICD-10-CM | POA: Diagnosis not present

## 2018-05-10 MED ORDER — PROAIR HFA 108 (90 BASE) MCG/ACT IN AERS: 1.0000 | INHALATION_SPRAY | Freq: Four times a day (QID) | RESPIRATORY_TRACT | 1 refills | Status: DC | PRN

## 2018-05-10 MED ORDER — OXYCODONE-ACETAMINOPHEN 5-325 MG PO TABS: 1.0000 | ORAL_TABLET | Freq: Four times a day (QID) | ORAL | 0 refills | Status: DC | PRN

## 2018-05-10 MED ORDER — HYDROCOD POLST-CPM POLST ER 10-8 MG/5ML PO SUER: 5.0000 mL | Freq: Two times a day (BID) | ORAL | 0 refills | Status: DC | PRN

## 2018-05-10 MED ORDER — PREDNISONE 10 MG PO TABS: ORAL_TABLET | ORAL | 0 refills | Status: DC

## 2018-05-10 MED ORDER — LEVOFLOXACIN 500 MG PO TABS: 500.0000 mg | ORAL_TABLET | Freq: Every day | ORAL | 0 refills | Status: DC

## 2018-05-10 MED ORDER — FLUCONAZOLE 150 MG PO TABS: 150.0000 mg | ORAL_TABLET | Freq: Once | ORAL | 0 refills | Status: AC

## 2018-05-10 MED ORDER — FLUTICASONE-SALMETEROL 250-50 MCG/DOSE IN AEPB: 1.0000 | INHALATION_SPRAY | Freq: Two times a day (BID) | RESPIRATORY_TRACT | 5 refills | Status: DC

## 2018-05-10 NOTE — Progress Notes (Signed)
Patient: Sabrina Morris Female    DOB: Jul 12, 1965   52 y.o.   MRN: [id number] Visit Date: 05/10/2018  Today's Provider: Mar Daring, PA-C   Chief Complaint  Patient presents with  . Cough   Subjective:     HPI  Patient here today with c/o cough not better. Antibiotic was sent in for patient on 02/03 for Bronchitis. She has been using her inhaler albuterol and Advair. She reports that this last time she took the antibiotic, sudafed and Zyrtec. It gave her thrush and Diflucan was used to treat thrush.   Allergies  Allergen Reactions  . Sumatriptan Other (See Comments) and Shortness Of Breath  . Prometrium [Progesterone Micronized] Other (See Comments)    dizziness  . Phentermine-Topiramate Other (See Comments)  . Sulfa Antibiotics Other (See Comments)    GI distress  . Sulfamethoxazole Other (See Comments)  . Tamoxifen Other (See Comments)    Edema, excess fluid  . Zoladex  [Goserelin]   . Hydrocodone-Acetaminophen Rash    Hyperactivity     Current Outpatient Medications:  .  albuterol (PROVENTIL HFA;VENTOLIN HFA) 108 (90 Base) MCG/ACT inhaler, Inhale 1-2 puffs into the lungs every 6 (six) hours as needed for wheezing or shortness of breath., Disp: 1 Inhaler, Rfl: 3 .  butalbital-acetaminophen-caffeine (FIORICET, ESGIC) 50-325-40 MG tablet, Take 1-2 tablets by mouth every 6 (six) hours as needed for headache., Disp: 60 tablet, Rfl: 5 .  calcium carbonate 100 mg/ml SUSP, Take by mouth., Disp: , Rfl:  .  Cholecalciferol (VITAMIN D3) 5000 units TABS, Take by mouth., Disp: , Rfl:  .  cyclobenzaprine (FLEXERIL) 5 MG tablet, TAKE 1 TABLET BY MOUTH THREE TIMES DAILYAS NEEDED, Disp: 90 tablet, Rfl: 1 .  docusate sodium (STOOL SOFTENER) 100 MG capsule, Take by mouth., Disp: , Rfl:  .  estradiol (ESTRACE) 0.5 MG tablet, , Disp: , Rfl: 2 .  fluocinonide (LIDEX) 0.05 % external solution, Apply 1 application topically 2 (two) times daily., Disp: 60 mL, Rfl: 3 .   Fluticasone-Salmeterol (ADVAIR) 250-50 MCG/DOSE AEPB, Inhale 1 puff into the lungs 2 (two) times daily., Disp: 60 each, Rfl: 5 .  furosemide (LASIX) 20 MG tablet, TAKE 2 TABLETS BY MOUTH DAILY, Disp: 60 tablet, Rfl: 5 .  ibuprofen (ADVIL,MOTRIN) 600 MG tablet, Take 1 tablet (600 mg total) by mouth every 8 (eight) hours as needed., Disp: 360 tablet, Rfl: 1 .  LORazepam (ATIVAN) 1 MG tablet, TAKE 1 TABLET BY MOUTH TWICE A DAY AS NEEDED., Disp: 60 tablet, Rfl: 5 .  Multiple Vitamin (MULTIVITAMIN) capsule, Take by mouth., Disp: , Rfl:  .  oxyCODONE-acetaminophen (PERCOCET/ROXICET) 5-325 MG tablet, Take 1-2 tablets by mouth every 6 (six) hours as needed for severe pain., Disp: 30 tablet, Rfl: 0 .  phentermine (ADIPEX-P) 37.5 MG tablet, Take 0.5-1 tablets (18.75-37.5 mg total) by mouth daily before breakfast., Disp: 30 tablet, Rfl: 2 .  potassium chloride (K-DUR) 10 MEQ tablet, TAKE 1 TABLET BY MOUTH DAILY, Disp: 90 tablet, Rfl: 1 .  progesterone (PROMETRIUM) 100 MG capsule, Take 100 mg by mouth., Disp: , Rfl:  .  promethazine (PHENERGAN) 25 MG tablet, Take 1 tablet (25 mg total) by mouth every 8 (eight) hours as needed for nausea or vomiting., Disp: 20 tablet, Rfl: 0 .  rizatriptan (MAXALT) 10 MG tablet, TAKE 1 TABLET AT FIRST SIGN OF MIGRAINE SYMPTOMS. IF NO RELIEF, A SECOND TABLET MAY BE TAKEN IN 2 HOURS. LIMIT 2 TABLETSPER DAY AND 5 TABLET,  Disp: 15 tablet, Rfl: 3 .  temazepam (RESTORIL) 15 MG capsule, TAKE 1 TO 2 CAPSULES BY MOUTH AT BEDTIMEAS NEEDED FOR SLEEP, Disp: 60 capsule, Rfl: 3 .  valACYclovir (VALTREX) 500 MG tablet, Take 1 tablet (500 mg total) by mouth 2 (two) times daily. Prn fever blisters, Disp: 30 tablet, Rfl: 5 .  potassium chloride (K-DUR,KLOR-CON) 10 MEQ tablet, TAKE 1 TABLET EVERY DAY, Disp: 90 tablet, Rfl: 1  Review of Systems  Constitutional: Positive for chills and fatigue.  HENT: Positive for congestion, postnasal drip, sinus pressure, sinus pain, sore throat and trouble  swallowing. Negative for ear pain and rhinorrhea.   Eyes: Negative for visual disturbance.  Respiratory: Positive for cough, shortness of breath and wheezing. Negative for chest tightness.   Cardiovascular: Negative for chest pain, palpitations and leg swelling.  Gastrointestinal: Negative for abdominal pain and nausea.  Neurological: Negative for dizziness, light-headedness and headaches.    Social History   Tobacco Use  . Smoking status: Never Smoker  . Smokeless tobacco: Never Used  Substance Use Topics  . Alcohol use: No      Objective:   BP (!) 146/81 (BP Location: Left Arm, Patient Position: Sitting, Cuff Size: Large)   Pulse (!) 111   Temp 98 F (36.7 C) (Oral)   Resp 16   Wt 213 lb (96.6 kg)   SpO2 97%   BMI 35.45 kg/m  Vitals:   05/10/18 1505  BP: (!) 146/81  Pulse: (!) 111  Resp: 16  Temp: 98 F (36.7 C)  TempSrc: Oral  SpO2: 97%  Weight: 213 lb (96.6 kg)     Physical Exam Vitals signs reviewed.  Constitutional:      General: She is not in acute distress.    Appearance: Normal appearance. She is well-developed. She is obese. She is not ill-appearing or diaphoretic.  HENT:     Head: Normocephalic and atraumatic.     Right Ear: Hearing, tympanic membrane, ear canal and external ear normal.     Left Ear: Hearing, tympanic membrane, ear canal and external ear normal.     Nose: Nose normal.     Mouth/Throat:     Pharynx: Uvula midline. No oropharyngeal exudate.  Eyes:     General: No scleral icterus.       Right eye: No discharge.        Left eye: No discharge.     Conjunctiva/sclera: Conjunctivae normal.     Pupils: Pupils are equal, round, and reactive to light.  Neck:     Musculoskeletal: Normal range of motion and neck supple.     Thyroid: No thyromegaly.     Trachea: No tracheal deviation.  Cardiovascular:     Rate and Rhythm: Normal rate and regular rhythm.     Heart sounds: Normal heart sounds. No murmur. No friction rub. No gallop.     Pulmonary:     Effort: Pulmonary effort is normal. No respiratory distress.     Breath sounds: No stridor. Examination of the right-middle field reveals wheezing. Examination of the right-lower field reveals wheezing. Examination of the left-lower field reveals wheezing. Wheezing present. No rales.  Lymphadenopathy:     Cervical: No cervical adenopathy.  Skin:    General: Skin is warm and dry.  Neurological:     Mental Status: She is alert.        Assessment & Plan    1. Moderate persistent asthmatic bronchitis with acute exacerbation Worsening. Failed augmentin. Patient states she cannot take zpak ("  it's like water to me"). States levaquin works well. This will be sent in as below with 12 day prednisone taper. Also refilled inhalers (advair and proair). Patient reports generic albuterol makes her heart race and causes palpitations. ProAir (brand) never caused this and she is requesting to have this sent in instead. Tussionex also sent in for cough. Push fluids. Call if worsening.  - predniSONE (DELTASONE) 10 MG tablet; Take 6 tabs PO on day 1&2, 5 tabs PO on day 3&4, 4 tabs PO on day 5&6, 3 tabs PO on day 7&8, 2 tabs PO on day 9&10, 1 tab PO on day 11&12.  Dispense: 42 tablet; Refill: 0 - PROAIR HFA 108 (90 Base) MCG/ACT inhaler; Inhale 1 puff into the lungs every 6 (six) hours as needed for wheezing or shortness of breath.  Dispense: 18 g; Refill: 1 - Fluticasone-Salmeterol (ADVAIR) 250-50 MCG/DOSE AEPB; Inhale 1 puff into the lungs 2 (two) times daily.  Dispense: 60 each; Refill: 5 - chlorpheniramine-HYDROcodone (TUSSIONEX PENNKINETIC ER) 10-8 MG/5ML SUER; Take 5 mLs by mouth every 12 (twelve) hours as needed for cough.  Dispense: 140 mL; Refill: 0 - levofloxacin (LEVAQUIN) 500 MG tablet; Take 1 tablet (500 mg total) by mouth daily.  Dispense: 7 tablet; Refill: 0  2. Antibiotic-induced yeast infection Gets yeast infections or thrush. Diflucan for prn use.  - fluconazole (DIFLUCAN) 150  MG tablet; Take 1 tablet (150 mg total) by mouth once for 1 dose.  Dispense: 1 tablet; Refill: 0  3. DDD (degenerative disc disease), cervical Stable. Diagnosis pulled for medication refill. Continue current medical treatment plan. - oxyCODONE-acetaminophen (PERCOCET/ROXICET) 5-325 MG tablet; Take 1-2 tablets by mouth every 6 (six) hours as needed for severe pain.  Dispense: 30 tablet; Refill: 0     Mar Daring, PA-C  [facility]

## 2018-05-10 NOTE — Patient Instructions (Signed)
Acute Bronchitis, Adult Acute bronchitis is when air tubes (bronchi) in the lungs suddenly get swollen. The condition can make it hard to breathe. It can also cause these symptoms:  A cough.  Coughing up clear, yellow, or green mucus.  Wheezing.  Chest congestion.  Shortness of breath.  A fever.  Body aches.  Chills.  A sore throat. Follow these instructions at home:  Medicines  Take over-the-counter and prescription medicines only as told by your doctor.  If you were prescribed an antibiotic medicine, take it as told by your doctor. Do not stop taking the antibiotic even if you start to feel better. General instructions  Rest.  Drink enough fluids to keep your pee (urine) pale yellow.  Avoid smoking and secondhand smoke. If you smoke and you need help quitting, ask your doctor. Quitting will help your lungs heal faster.  Use an inhaler, cool mist vaporizer, or humidifier as told by your doctor.  Keep all follow-up visits as told by your doctor. This is important. How is this prevented? To lower your risk of getting this condition again:  Wash your hands often with soap and water. If you cannot use soap and water, use hand sanitizer.  Avoid contact with people who have cold symptoms.  Try not to touch your hands to your mouth, nose, or eyes.  Make sure to get the flu shot every year. Contact a doctor if:  Your symptoms do not get better in 2 weeks. Get help right away if:  You cough up blood.  You have chest pain.  You have very bad shortness of breath.  You become dehydrated.  You faint (pass out) or keep feeling like you are going to pass out.  You keep throwing up (vomiting).  You have a very bad headache.  Your fever or chills gets worse. This information is not intended to replace advice given to you by your health care provider. Make sure you discuss any questions you have with your health care provider. Document Released: 08/20/2007 Document  Revised: 10/15/2016 Document Reviewed: 08/22/2015 Elsevier Interactive Patient Education  2019 Elsevier Inc.  

## 2018-05-11 ENCOUNTER — Encounter: Payer: Self-pay | Admitting: Physician Assistant

## 2018-05-11 ENCOUNTER — Telehealth: Payer: Self-pay | Admitting: Physician Assistant

## 2018-05-11 DIAGNOSIS — J40 Bronchitis, not specified as acute or chronic: Secondary | ICD-10-CM

## 2018-05-11 NOTE — Telephone Encounter (Signed)
°  levofloxacin (LEVAQUIN) 500 MG tablet - Pt has reaction to this Rx and cannot take.  Pt was reminded by pharmacist.  Pt asked if a Zpack could be called in in it's place.  Please call into:  [place], [place] - [address] [phone] (Phone) [phone] (Fax)   Thanks, [facility]

## 2018-05-11 NOTE — Telephone Encounter (Signed)
Please advise 

## 2018-05-12 MED ORDER — DOXYCYCLINE HYCLATE 100 MG PO TABS: 100.0000 mg | ORAL_TABLET | Freq: Two times a day (BID) | ORAL | 0 refills | Status: DC

## 2018-05-12 MED ORDER — AZITHROMYCIN 250 MG PO TABS: ORAL_TABLET | ORAL | 0 refills | Status: DC

## 2018-05-12 NOTE — Telephone Encounter (Signed)
Zpak sent in 

## 2018-06-03 ENCOUNTER — Other Ambulatory Visit: Payer: Self-pay | Admitting: Physician Assistant

## 2018-06-03 DIAGNOSIS — F339 Major depressive disorder, recurrent, unspecified: Secondary | ICD-10-CM

## 2018-07-21 ENCOUNTER — Other Ambulatory Visit: Payer: Self-pay | Admitting: Physician Assistant

## 2018-07-21 DIAGNOSIS — Z6835 Body mass index (BMI) 35.0-35.9, adult: Secondary | ICD-10-CM

## 2018-07-22 ENCOUNTER — Encounter: Payer: Self-pay | Admitting: Physician Assistant

## 2018-07-22 DIAGNOSIS — Z6835 Body mass index (BMI) 35.0-35.9, adult: Secondary | ICD-10-CM

## 2018-07-22 MED ORDER — PHENTERMINE HCL 37.5 MG PO TABS: 18.7500 mg | ORAL_TABLET | Freq: Every day | ORAL | 2 refills | Status: DC

## 2018-08-04 ENCOUNTER — Encounter: Payer: Self-pay | Admitting: Physician Assistant

## 2018-08-04 DIAGNOSIS — M7989 Other specified soft tissue disorders: Secondary | ICD-10-CM

## 2018-08-19 ENCOUNTER — Other Ambulatory Visit: Payer: Self-pay | Admitting: Physician Assistant

## 2018-08-19 DIAGNOSIS — M5030 Other cervical disc degeneration, unspecified cervical region: Secondary | ICD-10-CM

## 2018-08-23 ENCOUNTER — Telehealth: Payer: Self-pay | Admitting: Hematology and Oncology

## 2018-08-23 NOTE — Assessment & Plan Note (Signed)
Right breast cancer T3 N1 M0 stage IIIa invasive ductal carcinoma ER/PR positive HER-2 negative status post mastectomy December 2012, followed by 6 cycles of TAC completed June 2013 followed by radiation July 2013, started tamoxifen September 2013, started Zoladex January 2014, discontinued all antiestrogen therapy January 2015  Tamoxifen toxicities: Profound anasarca and severe weight gain from fluid buildup Lymphedema management: Patient undergoes manual lymphedema drainage procedures every day as well as sleep and lymphedema pumps to help with her lymphedema issues. Current treatment of swelling: Lasix 20 by mouth twice a day, phentermine, Adipex  Menopausal symptoms: Patient is currently receivingestrogen replacement therapy for the menopausal symptoms. She is fully aware that she is risking breast cancer relapse by taking estrogen replacement therapy.  Breast Cancer Surveillance: 1. Breast exam 08/25/2017: Right mastectomy, no palpable abnormalities, lymphedema 2. Mammogram left breast: 08/24/2017: No findings suspicious for malignancy. Breast density category C new mammogram has been scheduled for June 12 3.  June 2019: PET CT scan negative  Return to clinic in 1 year for follow-up

## 2018-08-23 NOTE — Telephone Encounter (Signed)
I left a message regarding video visit 6/15

## 2018-08-27 ENCOUNTER — Ambulatory Visit
Admission: RE | Admit: 2018-08-27 | Discharge: 2018-08-27 | Disposition: A | Discharge status: Home / Self Care | Payer: BLUE CROSS/BLUE SHIELD | Source: Ambulatory Visit | Attending: Physician Assistant | Admitting: Physician Assistant

## 2018-08-27 ENCOUNTER — Other Ambulatory Visit: Payer: Self-pay

## 2018-08-27 DIAGNOSIS — Z853 Personal history of malignant neoplasm of breast: Secondary | ICD-10-CM | POA: Diagnosis not present

## 2018-08-27 DIAGNOSIS — R922 Inconclusive mammogram: Secondary | ICD-10-CM | POA: Diagnosis not present

## 2018-08-27 DIAGNOSIS — N6459 Other signs and symptoms in breast: Secondary | ICD-10-CM | POA: Diagnosis not present

## 2018-08-27 DIAGNOSIS — M7989 Other specified soft tissue disorders: Secondary | ICD-10-CM

## 2018-08-28 ENCOUNTER — Other Ambulatory Visit: Payer: Self-pay | Admitting: Physician Assistant

## 2018-08-28 DIAGNOSIS — I890 Lymphedema, not elsewhere classified: Secondary | ICD-10-CM
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
## 2018-08-30 ENCOUNTER — Inpatient Hospital Stay: Payer: Medicare Other | Attending: Hematology and Oncology | Admitting: Hematology and Oncology

## 2018-08-30 DIAGNOSIS — Z853 Personal history of malignant neoplasm of breast: Secondary | ICD-10-CM

## 2018-08-30 DIAGNOSIS — Z923 Personal history of irradiation: Secondary | ICD-10-CM

## 2018-08-30 DIAGNOSIS — Z9221 Personal history of antineoplastic chemotherapy: Secondary | ICD-10-CM | POA: Diagnosis not present

## 2018-09-29 ENCOUNTER — Encounter: Payer: Self-pay | Admitting: Physician Assistant

## 2018-09-29 DIAGNOSIS — R638 Other symptoms and signs concerning food and fluid intake: Secondary | ICD-10-CM

## 2018-09-29 DIAGNOSIS — D649 Anemia, unspecified: Secondary | ICD-10-CM

## 2018-10-13 ENCOUNTER — Other Ambulatory Visit: Payer: Self-pay | Admitting: Physician Assistant

## 2018-10-13 DIAGNOSIS — G479 Sleep disorder, unspecified: Secondary | ICD-10-CM

## 2018-10-18 DIAGNOSIS — R638 Other symptoms and signs concerning food and fluid intake: Secondary | ICD-10-CM | POA: Diagnosis not present

## 2018-10-18 DIAGNOSIS — D649 Anemia, unspecified: Secondary | ICD-10-CM | POA: Diagnosis not present

## 2018-10-19 ENCOUNTER — Encounter: Payer: Self-pay | Admitting: Physician Assistant

## 2018-10-19 ENCOUNTER — Encounter: Payer: Self-pay | Admitting: Hematology and Oncology

## 2018-10-19 LAB — CORTISOL: Cortisol: 10.1 ug/dL

## 2018-10-19 LAB — CBC WITH DIFFERENTIAL/PLATELET
Basophils Absolute: 0.1 10*3/uL (ref 0.0–0.2)
Basos: 1 %
EOS (ABSOLUTE): 0.3 10*3/uL (ref 0.0–0.4)
Eos: 5 %
Hematocrit: 27.7 % — ABNORMAL LOW (ref 34.0–46.6)
Hemoglobin: 7.4 g/dL — ABNORMAL LOW (ref 11.1–15.9)
Immature Grans (Abs): 0 10*3/uL (ref 0.0–0.1)
Immature Granulocytes: 0 %
Lymphocytes Absolute: 1.4 10*3/uL (ref 0.7–3.1)
Lymphs: 23 %
MCH: 17.9 pg — ABNORMAL LOW (ref 26.6–33.0)
MCHC: 26.7 g/dL — ABNORMAL LOW (ref 31.5–35.7)
MCV: 67 fL — ABNORMAL LOW (ref 79–97)
Monocytes Absolute: 0.5 10*3/uL (ref 0.1–0.9)
Monocytes: 7 %
Neutrophils Absolute: 3.9 10*3/uL (ref 1.4–7.0)
Neutrophils: 64 %
Platelets: 374 10*3/uL (ref 150–450)
RBC: 4.14 x10E6/uL (ref 3.77–5.28)
RDW: 17.8 % — ABNORMAL HIGH (ref 11.7–15.4)
WBC: 6.1 10*3/uL (ref 3.4–10.8)

## 2018-10-19 LAB — COMPREHENSIVE METABOLIC PANEL
ALT: 12 IU/L (ref 0–32)
AST: 16 IU/L (ref 0–40)
Albumin/Globulin Ratio: 2 (ref 1.2–2.2)
Albumin: 4.3 g/dL (ref 3.8–4.9)
Alkaline Phosphatase: 87 IU/L (ref 39–117)
BUN/Creatinine Ratio: 13 (ref 9–23)
BUN: 12 mg/dL (ref 6–24)
Bilirubin Total: <0.2 mg/dL (ref 0.0–1.2)
CO2: 22 mmol/L (ref 20–29)
Calcium: 8.8 mg/dL (ref 8.7–10.2)
Chloride: 106 mmol/L (ref 96–106)
Creatinine, Ser: 0.92 mg/dL (ref 0.57–1.00)
GFR calc Af Amer: 82 mL/min/{1.73_m2} (ref >59)
GFR calc non Af Amer: 71 mL/min/{1.73_m2} (ref >59)
Globulin, Total: 2.1 g/dL (ref 1.5–4.5)
Glucose: 107 mg/dL — ABNORMAL HIGH (ref 65–99)
Potassium: 4.5 mmol/L (ref 3.5–5.2)
Sodium: 142 mmol/L (ref 134–144)
Total Protein: 6.4 g/dL (ref 6.0–8.5)

## 2018-10-19 LAB — ACTH: ACTH: 15.7 pg/mL (ref 7.2–63.3)

## 2018-10-19 NOTE — Addendum Note (Signed)
Addended by: Mar Daring on: 10/19/2018 01:24 PM   Modules accepted: Orders

## 2018-10-19 NOTE — Addendum Note (Signed)
Addended by: Mar Daring on: 10/19/2018 01:25 PM   Modules accepted: Orders

## 2018-10-20 ENCOUNTER — Telehealth: Payer: Self-pay

## 2018-10-20 NOTE — Telephone Encounter (Signed)
No they will call her.

## 2018-10-20 NOTE — Telephone Encounter (Signed)
LVMTCB

## 2018-10-20 NOTE — Telephone Encounter (Signed)
Patient called stating that she was waiting on a call for an urgent appointment for hematology and no one has called her yet. Is there something she needs to do? Please advise.

## 2018-10-20 NOTE — Telephone Encounter (Signed)
Patient notified of results.

## 2018-10-21 LAB — SPECIMEN STATUS REPORT

## 2018-10-21 LAB — IRON AND TIBC
Iron Saturation: 3 % — CL (ref 15–55)
Iron: 13 ug/dL — ABNORMAL LOW (ref 27–159)
Total Iron Binding Capacity: 458 ug/dL — ABNORMAL HIGH (ref 250–450)
UIBC: 445 ug/dL — ABNORMAL HIGH (ref 131–425)

## 2018-10-21 LAB — FERRITIN: Ferritin: 3 ng/mL — ABNORMAL LOW (ref 15–150)

## 2018-10-22 ENCOUNTER — Encounter: Payer: Self-pay | Admitting: Physician Assistant

## 2018-10-22 ENCOUNTER — Telehealth: Payer: Self-pay

## 2018-10-22 NOTE — Telephone Encounter (Signed)
Patient called and stated that GI doctor with Sabrina Morris is wanting patient from colonoscopy & EGG from March of 2017. Patient states that she has tried to look it up via MyChart but has not had any success with it. The fax number is :[phone], please advise.

## 2018-10-25 ENCOUNTER — Other Ambulatory Visit: Payer: Self-pay

## 2018-10-26 ENCOUNTER — Inpatient Hospital Stay: Payer: Medicare Other | Attending: Internal Medicine | Admitting: Internal Medicine

## 2018-10-26 ENCOUNTER — Other Ambulatory Visit: Payer: Self-pay

## 2018-10-26 ENCOUNTER — Encounter: Payer: Self-pay | Admitting: Internal Medicine

## 2018-10-26 DIAGNOSIS — Z923 Personal history of irradiation: Secondary | ICD-10-CM | POA: Insufficient documentation

## 2018-10-26 DIAGNOSIS — Z79899 Other long term (current) drug therapy: Secondary | ICD-10-CM | POA: Diagnosis not present

## 2018-10-26 DIAGNOSIS — Z803 Family history of malignant neoplasm of breast: Secondary | ICD-10-CM | POA: Insufficient documentation

## 2018-10-26 DIAGNOSIS — Z170 Estrogen receptor positive status [ER+]: Secondary | ICD-10-CM | POA: Insufficient documentation

## 2018-10-26 DIAGNOSIS — Z7951 Long term (current) use of inhaled steroids: Secondary | ICD-10-CM | POA: Insufficient documentation

## 2018-10-26 DIAGNOSIS — Z853 Personal history of malignant neoplasm of breast: Secondary | ICD-10-CM | POA: Insufficient documentation

## 2018-10-26 DIAGNOSIS — D508 Other iron deficiency anemias: Secondary | ICD-10-CM | POA: Diagnosis not present

## 2018-10-26 DIAGNOSIS — D500 Iron deficiency anemia secondary to blood loss (chronic): Secondary | ICD-10-CM

## 2018-10-26 DIAGNOSIS — Z9221 Personal history of antineoplastic chemotherapy: Secondary | ICD-10-CM | POA: Diagnosis not present

## 2018-10-26 LAB — URINALYSIS, COMPLETE (UACMP) WITH MICROSCOPIC
Bacteria, UA: NONE SEEN
Bilirubin Urine: NEGATIVE
Glucose, UA: NEGATIVE mg/dL
Hgb urine dipstick: NEGATIVE
Ketones, ur: NEGATIVE mg/dL
Nitrite: NEGATIVE
Protein, ur: NEGATIVE mg/dL
Specific Gravity, Urine: 1.009 (ref 1.005–1.030)
pH: 5 (ref 5.0–8.0)

## 2018-10-26 NOTE — Progress Notes (Signed)
Sabrina Morris NOTE  Patient Care Team: Mar Daring, PA-C as PCP - General (Family Medicine)  CHIEF COMPLAINTS/PURPOSE OF CONSULTATION: Anemia   HEMATOLOGY HISTORY  #Iron deficiency anemia -AUG 3rd 2020-hemoglobin 7.4 MCV 67; ferritin 3/iron saturation 3% [ EGD/colonoscopy- 2017 [screening;[facility]; Dr.Buccini; [place];capsule-none].   #Right breast cancer ER PR positive HER-2 negative; T3N1 at 45 right s/p mastectomy N-1/20 LN- ALND s/p RT; TAC x 6 cycles [Dr.Marcom/Dr.Gudena]  # Lymphedema [Dr.Malone]  Oncology History  History of breast cancer  01/01/2011 Initial Diagnosis   Patient presented with palpable lump in the right breast a dumbbell-shaped tumor was identified spanning 7 cm   02/24/2011 Surgery   Right mastectomy revealed a dumbbell-shaped mass 3.5 cm and 2.5 cm with a total span of 7 cm 1/20 lymph nodes were positive ER/PR positive HER-2 negative   05/07/2011 - 09/04/2011 Chemotherapy   Adjuvant chemotherapy with Taxol, Adriamycin, Cytoxan every 3 weeks 6 cycles at [facility] ([email or web address])   09/19/2011 - 10/29/2011 Radiation Therapy   Adjuvant radiation therapy 30 fractions including right axilla   12/01/2011 - 04/01/2013 Anti-estrogen oral therapy   Adjuvant tamoxifen was started reluctantly, Zoladex was added January 2014 and antiestrogen therapy was discontinued January 2015 (profound lymphedema and anasarca )      HISTORY OF PRESENTING ILLNESS:  Sabrina Morris 53 y.o.  female with prior history of above breast cancer; without any prior history of anemia has been referred to [place] for further evaluation/work-up for anemia.  Patient complains of extreme fatigue and shortness of breath with exertion.  Denies any cough.  Although patient does admit to history of multiple bronchitis in the last few months currently none.  She never been tested for COVID.  Patient denies any blood in stools.  Denies any black-colored stools.  Denies any  change in bowel habits.  Patient had EGD colonoscopy in 2017.  Denies any vaginal bleeding.  Denies any blood in urine.  No abdominal surgeries.  About 10 pounds weight loss in the last 1 year.  No difficulty swallowing.  She is not on p.o. iron.  Review of Systems  Constitutional: Positive for malaise/fatigue. Negative for chills, diaphoresis and fever.  HENT: Negative for nosebleeds and sore throat.   Eyes: Negative for double vision.  Respiratory: Positive for shortness of breath. Negative for cough, hemoptysis, sputum production and wheezing.   Cardiovascular: Negative for chest pain, palpitations, orthopnea and leg swelling.  Gastrointestinal: Negative for abdominal pain, blood in stool, constipation, diarrhea, heartburn, melena, nausea and vomiting.  Genitourinary: Negative for dysuria, frequency and urgency.  Musculoskeletal: Positive for back pain and joint pain.  Skin: Negative.  Negative for itching and rash.  Neurological: Negative for dizziness, tingling, focal weakness, weakness and headaches.  Endo/Heme/Allergies: Does not bruise/bleed easily.  Psychiatric/Behavioral: Negative for depression. The patient is not nervous/anxious and does not have insomnia.     MEDICAL HISTORY:  Past Medical History:  Diagnosis Date  . Adrenal disorder ([place])   . Breast cancer ([place])   . H/O bone density study 2015  . H/O colonoscopy    sch 06/12/15  . Lymphedema   . Lymphedema   . Migraine   . Pap smear for cervical cancer screening [id number]    SURGICAL HISTORY: Past Surgical History:  Procedure Laterality Date  . breast  Left 02/18/2017   Implant removed  . BREAST ENHANCEMENT SURGERY Bilateral 2005  . CARDIAC CATHETERIZATION  1994  . CARDIAC CATHETERIZATION  1994  . MASTECTOMY Right 02/2011  .  PORT A CATH INJECTION ([place] [facility])  2013    SOCIAL HISTORY: Social History   Socioeconomic History  . Marital status: Single    Spouse name: Not on file  . Number of children: Not on  file  . Years of education: Not on file  . Highest education level: Not on file  Occupational History  . Occupation: Programmer, multimedia: [employer]    Comment: Full Time  Social Needs  . Financial resource strain: Not on file  . Food insecurity    Worry: Not on file    Inability: Not on file  . Transportation needs    Medical: Not on file    Non-medical: Not on file  Tobacco Use  . Smoking status: Never Smoker  . Smokeless tobacco: Never Used  Substance and Sexual Activity  . Alcohol use: No  . Drug use: No  . Sexual activity: Never  Lifestyle  . Physical activity    Days per week: 0 days    Minutes per session: Not on file  . Stress: Not on file  Relationships  . Social Herbalist on phone: Not on file    Gets together: Not on file    Attends religious service: Not on file    Active member of club or organization: Not on file    Attends meetings of clubs or organizations: Not on file    Relationship status: Not on file  . Intimate partner violence    Fear of current or ex partner: Not on file    Emotionally abused: Not on file    Physically abused: Not on file    Forced sexual activity: Not on file  Other Topics Concern  . 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  No smoking or alcohol. Lives at home/with sister.     FAMILY HISTORY: Family History  Adopted: Yes  Problem Relation Age of Onset  . Breast cancer Sister 73       1/2 sister; Genetic testing neg at [facility]    ALLERGIES:  is allergic to sumatriptan; prometrium [progesterone micronized]; phentermine-topiramate; sulfa antibiotics; sulfamethoxazole; tamoxifen; zoladex  [goserelin]; and hydrocodone-acetaminophen.  MEDICATIONS:  Current Outpatient Medications  Medication Sig Dispense Refill  . butalbital-acetaminophen-caffeine (FIORICET, ESGIC) 50-325-40 MG tablet Take 1-2 tablets by mouth every 6  (six) hours as needed for headache. 60 tablet 5  . calcium carbonate 100 mg/ml SUSP Take by mouth.    . Cholecalciferol (VITAMIN D3) 5000 units TABS Take by mouth.    . cyclobenzaprine (FLEXERIL) 5 MG tablet TAKE 1 TABLET BY MOUTH THREE TIMES DAILYAS NEEDED 90 tablet 1  . docusate sodium (STOOL SOFTENER) 100 MG capsule Take by mouth.    . fluocinonide (LIDEX) 0.05 % external solution Apply 1 application topically 2 (two) times daily. 60 mL 3  . Fluticasone-Salmeterol (ADVAIR) 250-50 MCG/DOSE AEPB Inhale 1 puff into the lungs 2 (two) times daily. 60 each 5  . furosemide (LASIX) 20 MG tablet TAKE 2 TABLETS BY MOUTH DAILY 60 tablet 5  . ibuprofen (ADVIL) 600 MG tablet TAKE 1 TABLET (600MG TOTAL) BY MOUTH EVERY 8 (EIGHT) HOURS AS NEEDED. 360 tablet 1  . LORazepam (ATIVAN) 1 MG tablet TAKE 1 TABLET BY MOUTH TWICE A DAY AS NEEDED. 60 tablet 5  . Multiple Vitamin (MULTIVITAMIN) capsule Take by mouth.    . oxyCODONE-acetaminophen (PERCOCET/ROXICET) 5-325  MG tablet TAKE 1 TO 2 TABLETS BY MOUTH EVERY 6 HOURS AS NEEDED FOR SEVERE PAIN 30 tablet 0  . potassium chloride (K-DUR,KLOR-CON) 10 MEQ tablet TAKE 1 TABLET EVERY DAY 90 tablet 1  . PROAIR HFA 108 (90 Base) MCG/ACT inhaler Inhale 1 puff into the lungs every 6 (six) hours as needed for wheezing or shortness of breath. 18 g 1  . temazepam (RESTORIL) 15 MG capsule TAKE 1 TO 2 CAPSULES BY MOUTH AT BEDTIMEAS NEEDED FOR SLEEP 60 capsule 5  . estradiol (ESTRACE) 0.5 MG tablet   2  . progesterone (PROMETRIUM) 100 MG capsule Take 100 mg by mouth.    . promethazine (PHENERGAN) 25 MG tablet Take 1 tablet (25 mg total) by mouth every 8 (eight) hours as needed for nausea or vomiting. (Patient not taking: Reported on 10/26/2018) 20 tablet 0  . rizatriptan (MAXALT) 10 MG tablet TAKE 1 TABLET AT FIRST SIGN OF MIGRAINE SYMPTOMS. IF NO RELIEF, A SECOND TABLET MAY BE TAKEN IN 2 HOURS. LIMIT 2 TABLETSPER DAY AND 5 TABLET (Patient not taking: Reported on 10/26/2018) 15 tablet 3   . valACYclovir (VALTREX) 500 MG tablet Take 1 tablet (500 mg total) by mouth 2 (two) times daily. Prn fever blisters (Patient not taking: Reported on 10/26/2018) 30 tablet 5   No current facility-administered medications for this visit.       PHYSICAL EXAMINATION:   Vitals:   10/26/18 1108  BP: 128/66  Pulse: (!) 101  Resp: 20  Temp: (!) 97.4 F (36.3 C)   Filed Weights   10/26/18 1108  Weight: 203 lb (92.1 kg)    Physical Exam  Constitutional: She is oriented to person, place, and time and well-developed, well-nourished, and in no distress.  HENT:  Head: Normocephalic and atraumatic.  Mouth/Throat: Oropharynx is clear and moist. No oropharyngeal exudate.  Eyes: Pupils are equal, round, and reactive to light.  Neck: Normal range of motion. Neck supple.  Cardiovascular: Normal rate and regular rhythm.  Pulmonary/Chest: Effort normal and breath sounds normal. No respiratory distress. She has no wheezes.  Abdominal: Soft. Bowel sounds are normal. She exhibits no distension and no mass. There is no abdominal tenderness. There is no rebound and no guarding.  Musculoskeletal: Normal range of motion.        General: No tenderness or edema.  Neurological: She is alert and oriented to person, place, and time.  Skin: Skin is warm. There is pallor.  Psychiatric: Affect normal.    LABORATORY DATA:  I have reviewed the data as listed Lab Results  Component Value Date   WBC 6.1 10/18/2018   HGB 7.4 (L) 10/18/2018   HCT 27.7 (L) 10/18/2018   MCV 67 (L) 10/18/2018   PLT 374 10/18/2018   Recent Labs    11/13/17 1211 10/18/18 1131  NA 140 142  K 4.5 4.5  CL 102 106  CO2 21 22  GLUCOSE 98 107*  BUN 16 12  CREATININE 0.93 0.92  CALCIUM 9.4 8.8  GFRNONAA 71 71  GFRAA 82 82  PROT 7.0 6.4  ALBUMIN 4.6 4.3  AST 18 16  ALT 20 12  ALKPHOS 76 87  BILITOT 0.3 <0.2     No results found.  Iron deficiency anemia due to chronic blood loss #Severe iron deficient  anemia-question blood loss appears chronic MCV 67.  Hemoglobin 7.4; ferritin/iron saturation 3%.  Proceed with IV Feraheme.   # Etiology-?  It is unclear.  Check stool card UA.  Awaiting GI  evaluation at [facility].  If not readily evident then recommend CT scan of the abdomen pelvis.  # Discussed the potential acute infusion reactions with IV iron; which are quite rare.  Patient understands the risk; will proceed with infusions.  #History of breast cancer-followed by Dr.Gudena; reviewed the history no evidence of clinical recurrence.  # DISPOSITION: # UA/ stool card x3 # IV ferrahem weekly x3  # in 4 weeks- MD- cbc/bmp/ldh/b12/folic acid-possible ferrahem-Dr.B  # Thank you, MS.Burnett PA-C for allowing me to participate in the care of your pleasant patient. Please do not hesitate to contact me with questions or concerns in the interim.  Cc; Dr.Gudena   All questions were answered. The patient knows to call the clinic with any problems, questions or concerns.    Cammie Sickle, MD 10/26/2018 12:35 PM

## 2018-10-26 NOTE — Assessment & Plan Note (Addendum)
#  Severe iron deficient anemia-question blood loss appears chronic MCV 67.  Hemoglobin 7.4; ferritin/iron saturation 3%.  Proceed with IV Feraheme.   # Etiology-?  It is unclear.  Check stool card UA.  Awaiting GI evaluation at [facility].  If not readily evident then recommend CT scan of the abdomen pelvis.  # Discussed the potential acute infusion reactions with IV iron; which are quite rare.  Patient understands the risk; will proceed with infusions.  #History of breast cancer-followed by Dr.Gudena; reviewed the history no evidence of clinical recurrence.  # DISPOSITION: # UA/ stool card x3 # IV ferrahem weekly x3  # in 4 weeks- MD- cbc/bmp/ldh/b12/folic acid-possible ferrahem-Dr.B  # Thank you, MS.Burnett PA-C for allowing me to participate in the care of your pleasant patient. Please do not hesitate to contact me with questions or concerns in the interim.  Cc; Dr.Gudena

## 2018-10-27 ENCOUNTER — Encounter: Payer: Self-pay | Admitting: Internal Medicine

## 2018-10-31 DIAGNOSIS — Z79899 Other long term (current) drug therapy: Secondary | ICD-10-CM | POA: Diagnosis not present

## 2018-10-31 DIAGNOSIS — D508 Other iron deficiency anemias: Secondary | ICD-10-CM | POA: Diagnosis not present

## 2018-10-31 DIAGNOSIS — Z170 Estrogen receptor positive status [ER+]: Secondary | ICD-10-CM | POA: Diagnosis not present

## 2018-10-31 DIAGNOSIS — Z9221 Personal history of antineoplastic chemotherapy: Secondary | ICD-10-CM | POA: Diagnosis not present

## 2018-10-31 DIAGNOSIS — Z923 Personal history of irradiation: Secondary | ICD-10-CM | POA: Diagnosis not present

## 2018-10-31 DIAGNOSIS — Z853 Personal history of malignant neoplasm of breast: Secondary | ICD-10-CM | POA: Diagnosis not present

## 2018-10-31 DIAGNOSIS — Z7951 Long term (current) use of inhaled steroids: Secondary | ICD-10-CM | POA: Diagnosis not present

## 2018-10-31 DIAGNOSIS — Z803 Family history of malignant neoplasm of breast: Secondary | ICD-10-CM | POA: Diagnosis not present

## 2018-11-01 ENCOUNTER — Other Ambulatory Visit: Payer: Self-pay

## 2018-11-01 DIAGNOSIS — Z170 Estrogen receptor positive status [ER+]: Secondary | ICD-10-CM | POA: Diagnosis not present

## 2018-11-01 DIAGNOSIS — Z7951 Long term (current) use of inhaled steroids: Secondary | ICD-10-CM | POA: Diagnosis not present

## 2018-11-01 DIAGNOSIS — Z79899 Other long term (current) drug therapy: Secondary | ICD-10-CM | POA: Diagnosis not present

## 2018-11-01 DIAGNOSIS — D508 Other iron deficiency anemias: Secondary | ICD-10-CM | POA: Diagnosis not present

## 2018-11-01 DIAGNOSIS — Z853 Personal history of malignant neoplasm of breast: Secondary | ICD-10-CM | POA: Diagnosis not present

## 2018-11-01 DIAGNOSIS — Z803 Family history of malignant neoplasm of breast: Secondary | ICD-10-CM | POA: Diagnosis not present

## 2018-11-01 DIAGNOSIS — Z9221 Personal history of antineoplastic chemotherapy: Secondary | ICD-10-CM | POA: Diagnosis not present

## 2018-11-01 DIAGNOSIS — Z923 Personal history of irradiation: Secondary | ICD-10-CM | POA: Diagnosis not present

## 2018-11-02 ENCOUNTER — Inpatient Hospital Stay: Payer: Medicare Other

## 2018-11-02 ENCOUNTER — Other Ambulatory Visit: Payer: Self-pay

## 2018-11-02 VITALS — BP 120/87 | HR 93 | Temp 98.0°F | Resp 20

## 2018-11-02 DIAGNOSIS — Z923 Personal history of irradiation: Secondary | ICD-10-CM | POA: Diagnosis not present

## 2018-11-02 DIAGNOSIS — D508 Other iron deficiency anemias: Secondary | ICD-10-CM | POA: Diagnosis not present

## 2018-11-02 DIAGNOSIS — D500 Iron deficiency anemia secondary to blood loss (chronic): Secondary | ICD-10-CM

## 2018-11-02 DIAGNOSIS — Z79899 Other long term (current) drug therapy: Secondary | ICD-10-CM | POA: Diagnosis not present

## 2018-11-02 DIAGNOSIS — Z853 Personal history of malignant neoplasm of breast: Secondary | ICD-10-CM | POA: Diagnosis not present

## 2018-11-02 DIAGNOSIS — Z170 Estrogen receptor positive status [ER+]: Secondary | ICD-10-CM | POA: Diagnosis not present

## 2018-11-02 DIAGNOSIS — Z9221 Personal history of antineoplastic chemotherapy: Secondary | ICD-10-CM | POA: Diagnosis not present

## 2018-11-02 DIAGNOSIS — Z7951 Long term (current) use of inhaled steroids: Secondary | ICD-10-CM | POA: Diagnosis not present

## 2018-11-02 DIAGNOSIS — Z803 Family history of malignant neoplasm of breast: Secondary | ICD-10-CM | POA: Diagnosis not present

## 2018-11-02 LAB — OCCULT BLOOD X 1 CARD TO LAB, STOOL
Fecal Occult Bld: NEGATIVE
Fecal Occult Bld: NEGATIVE
Fecal Occult Bld: NEGATIVE

## 2018-11-02 MED ORDER — SODIUM CHLORIDE 0.9 % IV SOLN
510.0000 mg | Freq: Once | INTRAVENOUS | Status: AC
  Administered 2018-11-02: 510 mg via INTRAVENOUS
  Filled 2018-11-02: qty 17

## 2018-11-02 MED ORDER — SODIUM CHLORIDE 0.9 % IV SOLN
Freq: Once | INTRAVENOUS | Status: AC
  Administered 2018-11-02: 13:00:00 via INTRAVENOUS
  Filled 2018-11-02: qty 250

## 2018-11-03 ENCOUNTER — Encounter: Payer: Self-pay | Admitting: Internal Medicine

## 2018-11-03 NOTE — Telephone Encounter (Signed)
I spoke to pt re: of stool test as negative. Discussed re: CT scan ab/pelvis. Awaiting GI consult at [facility] next week. Pt will call us if she wants to proceed with CT with [place] or wait for GI eval at [facility].   Please order b12/mag level in appx 2 week from now, as per pt's request.

## 2018-11-04 ENCOUNTER — Other Ambulatory Visit: Payer: Self-pay | Admitting: Internal Medicine

## 2018-11-04 ENCOUNTER — Telehealth: Payer: Self-pay | Admitting: *Deleted

## 2018-11-04 ENCOUNTER — Other Ambulatory Visit: Payer: Self-pay | Admitting: *Deleted

## 2018-11-04 DIAGNOSIS — D500 Iron deficiency anemia secondary to blood loss (chronic): Secondary | ICD-10-CM

## 2018-11-04 NOTE — Telephone Encounter (Signed)
Patient called stating she would like to proceed with CT Scan as discussed last night and would like to be scheduled and have a return call with appointment.

## 2018-11-04 NOTE — Progress Notes (Signed)
Patient agreement.  CT scan abdomen pelvis to evaluate anemia.  Ordered.  Please schedule.

## 2018-11-04 NOTE — Telephone Encounter (Signed)
Patient already sent mychart msg this morning. See Dr. Sharmaine Base phone note.

## 2018-11-05 ENCOUNTER — Encounter: Payer: Self-pay | Admitting: Internal Medicine

## 2018-11-09 ENCOUNTER — Other Ambulatory Visit: Payer: Self-pay

## 2018-11-09 ENCOUNTER — Other Ambulatory Visit: Payer: Self-pay | Admitting: *Deleted

## 2018-11-09 ENCOUNTER — Other Ambulatory Visit: Payer: Self-pay | Admitting: Internal Medicine

## 2018-11-09 ENCOUNTER — Inpatient Hospital Stay: Payer: Medicare Other

## 2018-11-09 VITALS — BP 135/83 | HR 90 | Temp 98.8°F | Resp 18

## 2018-11-09 DIAGNOSIS — Z7951 Long term (current) use of inhaled steroids: Secondary | ICD-10-CM | POA: Diagnosis not present

## 2018-11-09 DIAGNOSIS — Z853 Personal history of malignant neoplasm of breast: Secondary | ICD-10-CM | POA: Diagnosis not present

## 2018-11-09 DIAGNOSIS — Z79899 Other long term (current) drug therapy: Secondary | ICD-10-CM | POA: Diagnosis not present

## 2018-11-09 DIAGNOSIS — Z9221 Personal history of antineoplastic chemotherapy: Secondary | ICD-10-CM | POA: Diagnosis not present

## 2018-11-09 DIAGNOSIS — Z803 Family history of malignant neoplasm of breast: Secondary | ICD-10-CM | POA: Diagnosis not present

## 2018-11-09 DIAGNOSIS — Z923 Personal history of irradiation: Secondary | ICD-10-CM | POA: Diagnosis not present

## 2018-11-09 DIAGNOSIS — R110 Nausea: Secondary | ICD-10-CM

## 2018-11-09 DIAGNOSIS — D500 Iron deficiency anemia secondary to blood loss (chronic): Secondary | ICD-10-CM

## 2018-11-09 DIAGNOSIS — Z170 Estrogen receptor positive status [ER+]: Secondary | ICD-10-CM | POA: Diagnosis not present

## 2018-11-09 DIAGNOSIS — D508 Other iron deficiency anemias: Secondary | ICD-10-CM | POA: Diagnosis not present

## 2018-11-09 MED ORDER — SODIUM CHLORIDE 0.9 % IV SOLN
Freq: Once | INTRAVENOUS | Status: AC
  Administered 2018-11-09: 14:00:00 via INTRAVENOUS
  Filled 2018-11-09: qty 250

## 2018-11-09 MED ORDER — SODIUM CHLORIDE 0.9 % IV SOLN
510.0000 mg | Freq: Once | INTRAVENOUS | Status: AC
  Administered 2018-11-09: 510 mg via INTRAVENOUS
  Filled 2018-11-09: qty 17

## 2018-11-09 MED ORDER — PROCHLORPERAZINE MALEATE 10 MG PO TABS: 10.0000 mg | ORAL_TABLET | Freq: Four times a day (QID) | ORAL | 0 refills | Status: DC | PRN

## 2018-11-09 MED ORDER — SODIUM CHLORIDE 0.9% FLUSH
10.0000 mL | Freq: Once | INTRAVENOUS | Status: DC | PRN
  Filled 2018-11-09: qty 10

## 2018-11-09 MED ORDER — HEPARIN SOD (PORK) LOCK FLUSH 100 UNIT/ML IV SOLN: 500.0000 [IU] | Freq: Once | INTRAVENOUS | Status: DC | PRN

## 2018-11-09 NOTE — Progress Notes (Signed)
Pt reports having nausea and headache for the "rest of day" after her previous fereheme infusion. This RN notified Dr. B who stated it is ok to proceed as ordered today.

## 2018-11-10 ENCOUNTER — Other Ambulatory Visit: Payer: Self-pay | Admitting: Physician Assistant

## 2018-11-10 DIAGNOSIS — M5030 Other cervical disc degeneration, unspecified cervical region: Secondary | ICD-10-CM

## 2018-11-11 DIAGNOSIS — Z6833 Body mass index (BMI) 33.0-33.9, adult: Secondary | ICD-10-CM | POA: Diagnosis not present

## 2018-11-11 DIAGNOSIS — D649 Anemia, unspecified: Secondary | ICD-10-CM | POA: Diagnosis not present

## 2018-11-13 ENCOUNTER — Other Ambulatory Visit: Payer: Self-pay | Admitting: Physician Assistant

## 2018-11-13 DIAGNOSIS — E876 Hypokalemia: Secondary | ICD-10-CM

## 2018-11-13 DIAGNOSIS — F419 Anxiety disorder, unspecified: Secondary | ICD-10-CM

## 2018-11-16 ENCOUNTER — Other Ambulatory Visit: Payer: Self-pay

## 2018-11-16 ENCOUNTER — Inpatient Hospital Stay: Payer: Medicare Other | Attending: Internal Medicine

## 2018-11-16 VITALS — BP 127/78 | HR 87 | Temp 98.7°F | Resp 18

## 2018-11-16 DIAGNOSIS — K449 Diaphragmatic hernia without obstruction or gangrene: Secondary | ICD-10-CM | POA: Diagnosis not present

## 2018-11-16 DIAGNOSIS — Z923 Personal history of irradiation: Secondary | ICD-10-CM | POA: Diagnosis not present

## 2018-11-16 DIAGNOSIS — Z803 Family history of malignant neoplasm of breast: Secondary | ICD-10-CM | POA: Insufficient documentation

## 2018-11-16 DIAGNOSIS — Z885 Allergy status to narcotic agent status: Secondary | ICD-10-CM | POA: Insufficient documentation

## 2018-11-16 DIAGNOSIS — R0602 Shortness of breath: Secondary | ICD-10-CM | POA: Insufficient documentation

## 2018-11-16 DIAGNOSIS — Z853 Personal history of malignant neoplasm of breast: Secondary | ICD-10-CM | POA: Diagnosis not present

## 2018-11-16 DIAGNOSIS — D500 Iron deficiency anemia secondary to blood loss (chronic): Secondary | ICD-10-CM | POA: Insufficient documentation

## 2018-11-16 DIAGNOSIS — D649 Anemia, unspecified: Secondary | ICD-10-CM

## 2018-11-16 DIAGNOSIS — Z170 Estrogen receptor positive status [ER+]: Secondary | ICD-10-CM | POA: Diagnosis not present

## 2018-11-16 DIAGNOSIS — Z882 Allergy status to sulfonamides status: Secondary | ICD-10-CM | POA: Diagnosis not present

## 2018-11-16 DIAGNOSIS — Z9011 Acquired absence of right breast and nipple: Secondary | ICD-10-CM | POA: Insufficient documentation

## 2018-11-16 DIAGNOSIS — G2581 Restless legs syndrome: Secondary | ICD-10-CM | POA: Insufficient documentation

## 2018-11-16 DIAGNOSIS — Z9889 Other specified postprocedural states: Secondary | ICD-10-CM

## 2018-11-16 DIAGNOSIS — Z9221 Personal history of antineoplastic chemotherapy: Secondary | ICD-10-CM | POA: Diagnosis not present

## 2018-11-16 DIAGNOSIS — R5383 Other fatigue: Secondary | ICD-10-CM | POA: Insufficient documentation

## 2018-11-16 DIAGNOSIS — Z79899 Other long term (current) drug therapy: Secondary | ICD-10-CM | POA: Insufficient documentation

## 2018-11-16 DIAGNOSIS — R110 Nausea: Secondary | ICD-10-CM | POA: Insufficient documentation

## 2018-11-16 DIAGNOSIS — I890 Lymphedema, not elsewhere classified: Secondary | ICD-10-CM | POA: Diagnosis not present

## 2018-11-16 HISTORY — DX: Anemia, unspecified: D64.9

## 2018-11-16 HISTORY — DX: Other specified postprocedural states: Z98.890

## 2018-11-16 MED ORDER — SODIUM CHLORIDE 0.9 % IV SOLN
Freq: Once | INTRAVENOUS | Status: AC
  Administered 2018-11-16: 14:00:00 via INTRAVENOUS
  Filled 2018-11-16: qty 250

## 2018-11-16 MED ORDER — SODIUM CHLORIDE 0.9 % IV SOLN
510.0000 mg | Freq: Once | INTRAVENOUS | Status: AC
  Administered 2018-11-16: 510 mg via INTRAVENOUS
  Filled 2018-11-16: qty 17

## 2018-11-17 ENCOUNTER — Ambulatory Visit: Payer: Medicare Other | Admitting: Gastroenterology

## 2018-11-19 ENCOUNTER — Ambulatory Visit
Admission: RE | Admit: 2018-11-19 | Discharge: 2018-11-19 | Disposition: A | Discharge status: Home / Self Care | Payer: Medicare Other | Source: Ambulatory Visit | Attending: Internal Medicine | Admitting: Internal Medicine

## 2018-11-19 ENCOUNTER — Other Ambulatory Visit: Payer: Self-pay

## 2018-11-19 DIAGNOSIS — Z9011 Acquired absence of right breast and nipple: Secondary | ICD-10-CM | POA: Insufficient documentation

## 2018-11-19 DIAGNOSIS — Z853 Personal history of malignant neoplasm of breast: Secondary | ICD-10-CM | POA: Diagnosis not present

## 2018-11-19 DIAGNOSIS — K449 Diaphragmatic hernia without obstruction or gangrene: Secondary | ICD-10-CM | POA: Insufficient documentation

## 2018-11-19 DIAGNOSIS — D509 Iron deficiency anemia, unspecified: Secondary | ICD-10-CM | POA: Diagnosis not present

## 2018-11-19 DIAGNOSIS — D500 Iron deficiency anemia secondary to blood loss (chronic): Secondary | ICD-10-CM | POA: Insufficient documentation

## 2018-11-19 MED ORDER — IOHEXOL 300 MG/ML  SOLN
100.0000 mL | Freq: Once | INTRAMUSCULAR | Status: AC | PRN
  Administered 2018-11-19: 100 mL via INTRAVENOUS

## 2018-11-23 ENCOUNTER — Encounter: Payer: Self-pay | Admitting: Internal Medicine

## 2018-11-24 NOTE — Progress Notes (Signed)
Called patient no answer left message  

## 2018-11-25 ENCOUNTER — Inpatient Hospital Stay: Payer: Medicare Other

## 2018-11-25 ENCOUNTER — Other Ambulatory Visit: Payer: Self-pay

## 2018-11-25 ENCOUNTER — Inpatient Hospital Stay (HOSPITAL_BASED_OUTPATIENT_CLINIC_OR_DEPARTMENT_OTHER): Payer: Medicare Other | Admitting: Internal Medicine

## 2018-11-25 DIAGNOSIS — G2581 Restless legs syndrome: Secondary | ICD-10-CM | POA: Diagnosis not present

## 2018-11-25 DIAGNOSIS — Z9011 Acquired absence of right breast and nipple: Secondary | ICD-10-CM | POA: Diagnosis not present

## 2018-11-25 DIAGNOSIS — Z9221 Personal history of antineoplastic chemotherapy: Secondary | ICD-10-CM | POA: Diagnosis not present

## 2018-11-25 DIAGNOSIS — D500 Iron deficiency anemia secondary to blood loss (chronic): Secondary | ICD-10-CM

## 2018-11-25 DIAGNOSIS — Z923 Personal history of irradiation: Secondary | ICD-10-CM | POA: Diagnosis not present

## 2018-11-25 DIAGNOSIS — Z885 Allergy status to narcotic agent status: Secondary | ICD-10-CM | POA: Diagnosis not present

## 2018-11-25 DIAGNOSIS — K449 Diaphragmatic hernia without obstruction or gangrene: Secondary | ICD-10-CM | POA: Diagnosis not present

## 2018-11-25 DIAGNOSIS — Z803 Family history of malignant neoplasm of breast: Secondary | ICD-10-CM | POA: Diagnosis not present

## 2018-11-25 DIAGNOSIS — R5383 Other fatigue: Secondary | ICD-10-CM | POA: Diagnosis not present

## 2018-11-25 DIAGNOSIS — R0602 Shortness of breath: Secondary | ICD-10-CM | POA: Diagnosis not present

## 2018-11-25 DIAGNOSIS — I890 Lymphedema, not elsewhere classified: Secondary | ICD-10-CM | POA: Diagnosis not present

## 2018-11-25 DIAGNOSIS — Z79899 Other long term (current) drug therapy: Secondary | ICD-10-CM | POA: Diagnosis not present

## 2018-11-25 DIAGNOSIS — R110 Nausea: Secondary | ICD-10-CM | POA: Diagnosis not present

## 2018-11-25 DIAGNOSIS — Z853 Personal history of malignant neoplasm of breast: Secondary | ICD-10-CM | POA: Diagnosis not present

## 2018-11-25 DIAGNOSIS — Z170 Estrogen receptor positive status [ER+]: Secondary | ICD-10-CM | POA: Diagnosis not present

## 2018-11-25 DIAGNOSIS — Z882 Allergy status to sulfonamides status: Secondary | ICD-10-CM | POA: Diagnosis not present

## 2018-11-25 LAB — BASIC METABOLIC PANEL
Anion gap: 9 (ref 5–15)
BUN: 23 mg/dL — ABNORMAL HIGH (ref 6–20)
CO2: 25 mmol/L (ref 22–32)
Calcium: 9.3 mg/dL (ref 8.9–10.3)
Chloride: 104 mmol/L (ref 98–111)
Creatinine, Ser: 0.84 mg/dL (ref 0.44–1.00)
GFR calc Af Amer: >60 mL/min (ref >60)
GFR calc non Af Amer: >60 mL/min (ref >60)
Glucose, Bld: 114 mg/dL — ABNORMAL HIGH (ref 70–99)
Potassium: 4 mmol/L (ref 3.5–5.1)
Sodium: 138 mmol/L (ref 135–145)

## 2018-11-25 LAB — CBC WITH DIFFERENTIAL/PLATELET
Abs Immature Granulocytes: 0.02 10*3/uL (ref 0.00–0.07)
Basophils Absolute: 0.1 10*3/uL (ref 0.0–0.1)
Basophils Relative: 1 %
Eosinophils Absolute: 0.2 10*3/uL (ref 0.0–0.5)
Eosinophils Relative: 3 %
HCT: 41.7 % (ref 36.0–46.0)
Hemoglobin: 12.8 g/dL (ref 12.0–15.0)
Immature Granulocytes: 0 %
Lymphocytes Relative: 24 %
Lymphs Abs: 1.4 10*3/uL (ref 0.7–4.0)
MCH: 25 pg — ABNORMAL LOW (ref 26.0–34.0)
MCHC: 30.7 g/dL (ref 30.0–36.0)
MCV: 81.6 fL (ref 80.0–100.0)
Monocytes Absolute: 0.5 10*3/uL (ref 0.1–1.0)
Monocytes Relative: 8 %
Neutro Abs: 3.8 10*3/uL (ref 1.7–7.7)
Neutrophils Relative %: 64 %
Platelets: 278 10*3/uL (ref 150–400)
RBC: 5.11 MIL/uL (ref 3.87–5.11)
Smear Review: NORMAL
WBC: 6 10*3/uL (ref 4.0–10.5)
nRBC: 0 % (ref 0.0–0.2)

## 2018-11-25 LAB — MAGNESIUM: Magnesium: 2.2 mg/dL (ref 1.7–2.4)

## 2018-11-25 LAB — LACTATE DEHYDROGENASE: LDH: 127 U/L (ref 98–192)

## 2018-11-25 LAB — VITAMIN B12: Vitamin B-12: 289 pg/mL (ref 180–914)

## 2018-11-25 LAB — FOLATE: Folate: 15.4 ng/mL (ref >5.9)

## 2018-11-25 NOTE — Assessment & Plan Note (Addendum)
#  Severe iron deficient anemia-question blood loss appears chronic MCV 67.  Hemoglobin 7.4; ferritin/iron saturation 3%.  Status post IV Feraheme [question reaction-see below].  #Today hemoglobin is 12.5.  Patient symptoms improved.  Hold off IV iron.  Await [facility] GI evaluation  # Etiology-?  It is unclear.CT A/P- NEG for acute cause of bleed; but showed-"Large hiatal hernia/inverted intrathoracic stomach" awaiting GI eval at [facility], GI next week.   #Possible delayed infusion reaction to [facility] recommend switching to Venofer for subsequent iron needs.   #History of breast cancer-followed by Dr.Gudena; reviewed the history no evidence of clinical recurrence.  Recent abdominal pelvic CT scan no concerns for recurrence.  # DISPOSITION: # HOLD IV Iron today # follow up in 2 months- MD/labs-cbc/Iron studies/ferritin- possible Venofer- Dr.B  # I reviewed the blood work- with the patient in detail; also reviewed the imaging independently [as summarized above]; and with the patient in detail.    Cc; Dr.Gudena

## 2018-11-25 NOTE — Progress Notes (Signed)
[place] NOTE  Patient Care Team: Mar Daring, PA-C as PCP - General (Family Medicine) Ellwood Handler., MD (Internal Medicine)  CHIEF COMPLAINTS/PURPOSE OF CONSULTATION: Anemia   HEMATOLOGY HISTORY  #Iron deficiency anemia -AUG 3rd 2020-hemoglobin 7.4 MCV 67; ferritin 3/iron saturation 3% [ EGD/colonoscopy- 2017 [screening;[facility]; Dr.Buccini; [place];capsule-none]. SEP 2020- CT abdomen pelvis-large hiatal hernia.  Feraheme-intolerance.   #Right breast cancer ER PR positive HER-2 negative; T3N1 at 45 right s/p mastectomy N-1/20 LN- ALND s/p RT; TAC x 6 cycles [Dr.Marcom/Dr.Gudena]  # Lymphedema [Dr.Malone]  Oncology History  History of breast cancer  01/01/2011 Initial Diagnosis   Patient presented with palpable lump in the right breast a dumbbell-shaped tumor was identified spanning 7 cm   02/24/2011 Surgery   Right mastectomy revealed a dumbbell-shaped mass 3.5 cm and 2.5 cm with a total span of 7 cm 1/20 lymph nodes were positive ER/PR positive HER-2 negative   05/07/2011 - 09/04/2011 Chemotherapy   Adjuvant chemotherapy with Taxol, Adriamycin, Cytoxan every 3 weeks 6 cycles at [facility] ([email or web address])   09/19/2011 - 10/29/2011 Radiation Therapy   Adjuvant radiation therapy 30 fractions including right axilla   12/01/2011 - 04/01/2013 Anti-estrogen oral therapy   Adjuvant tamoxifen was started reluctantly, Zoladex was added January 2014 and antiestrogen therapy was discontinued January 2015 (profound lymphedema and anasarca )      HISTORY OF PRESENTING ILLNESS:  Sabrina Morris 53 y.o.  female with prior history of above breast cancer; and Iron deficiency anemia CT scan.  Patient post IV Feraheme 3 infusions noted to have significant improvement of her shortness of breath on exertion.  Also noted improvement of her restless legs.  However noted to have nausea/skin itching/extreme fatigue approximately 1 hour post IV Feraheme  infusion.   Patient is awaiting GI evaluation at [facility] next week  Review of Systems  Constitutional: Positive for malaise/fatigue. Negative for chills, diaphoresis and fever.  HENT: Negative for nosebleeds and sore throat.   Eyes: Negative for double vision.  Respiratory: Positive for shortness of breath. Negative for cough, hemoptysis, sputum production and wheezing.   Cardiovascular: Negative for chest pain, palpitations, orthopnea and leg swelling.  Gastrointestinal: Negative for abdominal pain, blood in stool, constipation, diarrhea, heartburn, melena, nausea and vomiting.  Genitourinary: Negative for dysuria, frequency and urgency.  Musculoskeletal: Positive for back pain and joint pain.  Skin: Negative.  Negative for itching and rash.  Neurological: Negative for dizziness, tingling, focal weakness, weakness and headaches.  Endo/Heme/Allergies: Does not bruise/bleed easily.  Psychiatric/Behavioral: Negative for depression. The patient is not nervous/anxious and does not have insomnia.     MEDICAL HISTORY:  Past Medical History:  Diagnosis Date  . Adrenal disorder ([place])   . Breast cancer ([place])   . H/O bone density study 2015  . H/O colonoscopy    sch 06/12/15  . Lymphedema   . Lymphedema   . Migraine   . Pap smear for cervical cancer screening [id number]    SURGICAL HISTORY: Past Surgical History:  Procedure Laterality Date  . breast  Left 02/18/2017   Implant removed  . BREAST ENHANCEMENT SURGERY Bilateral 2005  . CARDIAC CATHETERIZATION  1994  . CARDIAC CATHETERIZATION  1994  . MASTECTOMY Right 02/2011  . PORT A CATH INJECTION ([place] [facility]X)  2013    SOCIAL HISTORY: Social History   Socioeconomic History  . Marital status: Single    Spouse name: Not on file  . Number of children: Not on file  .  Years of education: Not on file  . Highest education level: Not on file  Occupational History  . Occupation: Programmer, multimedia: [employer]    Comment: Full Time   Social Needs  . Financial resource strain: Not on file  . Food insecurity    Worry: Not on file    Inability: Not on file  . Transportation needs    Medical: Not on file    Non-medical: Not on file  Tobacco Use  . Smoking status: Never Smoker  . Smokeless tobacco: Never Used  Substance and Sexual Activity  . Alcohol use: No  . Drug use: No  . Sexual activity: Never  Lifestyle  . Physical activity    Days per week: 0 days    Minutes per session: Not on file  . Stress: Not on file  Relationships  . Social Herbalist on phone: Not on file    Gets together: Not on file    Attends religious service: Not on file    Active member of club or organization: Not on file    Attends meetings of clubs or organizations: Not on file    Relationship status: Not on file  . Intimate partner violence    Fear of current or ex partner: Not on file    Emotionally abused: Not on file    Physically abused: Not on file    Forced sexual activity: Not on file  Other Topics Concern  . 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  No smoking or alcohol. Lives at home/with sister.     FAMILY HISTORY: Family History  Adopted: Yes  Problem Relation Age of Onset  . Breast cancer Sister 71       1/2 sister; Genetic testing neg at [facility]    ALLERGIES:  is allergic to sumatriptan; prometrium [progesterone micronized]; feraheme [ferumoxytol]; phentermine-topiramate; sulfa antibiotics; sulfamethoxazole; tamoxifen; zoladex  [goserelin]; and hydrocodone-acetaminophen.  MEDICATIONS:  Current Outpatient Medications  Medication Sig Dispense Refill  . butalbital-acetaminophen-caffeine (FIORICET, ESGIC) 50-325-40 MG tablet Take 1-2 tablets by mouth every 6 (six) hours as needed for headache. 60 tablet 5  . calcium carbonate 100 mg/ml SUSP Take by mouth.    . Cholecalciferol (VITAMIN D3) 5000 units TABS Take by mouth.     . cyclobenzaprine (FLEXERIL) 5 MG tablet TAKE 1 TABLET BY MOUTH THREE TIMES DAILYAS NEEDED 90 tablet 1  . docusate sodium (STOOL SOFTENER) 100 MG capsule Take by mouth.    . fluocinonide (LIDEX) 0.05 % external solution Apply 1 application topically 2 (two) times daily. 60 mL 3  . Fluticasone-Salmeterol (ADVAIR) 250-50 MCG/DOSE AEPB Inhale 1 puff into the lungs 2 (two) times daily. 60 each 5  . furosemide (LASIX) 20 MG tablet TAKE 2 TABLETS BY MOUTH DAILY 60 tablet 5  . ibuprofen (ADVIL) 600 MG tablet TAKE 1 TABLET (600MG TOTAL) BY MOUTH EVERY 8 (EIGHT) HOURS AS NEEDED. 360 tablet 1  . LORazepam (ATIVAN) 1 MG tablet TAKE 1 TABLET BY MOUTH TWICE A DAY AS NEEDED. 60 tablet 5  . Multiple Vitamin (MULTIVITAMIN) capsule Take by mouth.    . oxyCODONE-acetaminophen (PERCOCET/ROXICET) 5-325 MG tablet TAKE 1 TO 2 TABLETS BY MOUTH EVERY 6 HOURS AS NEEDED FOR SEVERE PAIN 30 tablet 0  . potassium chloride (K-DUR) 10 MEQ tablet TAKE 1 TABLET BY MOUTH DAILY 90 tablet 1  . potassium  chloride (K-DUR,KLOR-CON) 10 MEQ tablet TAKE 1 TABLET EVERY DAY 90 tablet 1  . PROAIR HFA 108 (90 Base) MCG/ACT inhaler Inhale 1 puff into the lungs every 6 (six) hours as needed for wheezing or shortness of breath. 18 g 1  . prochlorperazine (COMPAZINE) 10 MG tablet Take 1 tablet (10 mg total) by mouth every 6 (six) hours as needed for nausea or vomiting. 30 tablet 0  . promethazine (PHENERGAN) 25 MG tablet Take 1 tablet (25 mg total) by mouth every 8 (eight) hours as needed for nausea or vomiting. 20 tablet 0  . rizatriptan (MAXALT) 10 MG tablet TAKE 1 TABLET AT FIRST SIGN OF MIGRAINE SYMPTOMS. IF NO RELIEF, A SECOND TABLET MAY BE TAKEN IN 2 HOURS. LIMIT 2 TABLETSPER DAY AND 5 TABLET 15 tablet 3  . temazepam (RESTORIL) 15 MG capsule TAKE 1 TO 2 CAPSULES BY MOUTH AT BEDTIMEAS NEEDED FOR SLEEP 60 capsule 5  . valACYclovir (VALTREX) 500 MG tablet Take 1 tablet (500 mg total) by mouth 2 (two) times daily. Prn fever blisters 30 tablet 5   . estradiol (ESTRACE) 0.5 MG tablet   2  . progesterone (PROMETRIUM) 100 MG capsule Take 100 mg by mouth.     No current facility-administered medications for this visit.       PHYSICAL EXAMINATION:   Vitals:   11/25/18 1015  BP: (!) 145/96  Pulse: 93  Resp: 20  Temp: 97.7 F (36.5 C)   Filed Weights   11/25/18 1015  Weight: 204 lb 8 oz (92.8 kg)    Physical Exam  Constitutional: She is oriented to person, place, and time and well-developed, well-nourished, and in no distress.  HENT:  Head: Normocephalic and atraumatic.  Mouth/Throat: Oropharynx is clear and moist. No oropharyngeal exudate.  Eyes: Pupils are equal, round, and reactive to light.  Neck: Normal range of motion. Neck supple.  Cardiovascular: Normal rate and regular rhythm.  Pulmonary/Chest: Effort normal and breath sounds normal. No respiratory distress. She has no wheezes.  Abdominal: Soft. Bowel sounds are normal. She exhibits no distension and no mass. There is no abdominal tenderness. There is no rebound and no guarding.  Musculoskeletal: Normal range of motion.        General: No tenderness or edema.  Neurological: She is alert and oriented to person, place, and time.  Skin: Skin is warm.  Psychiatric: Affect normal.    LABORATORY DATA:  I have reviewed the data as listed Lab Results  Component Value Date   WBC 6.0 11/25/2018   HGB 12.8 11/25/2018   HCT 41.7 11/25/2018   MCV 81.6 11/25/2018   PLT 278 11/25/2018   Recent Labs    10/18/18 1131 11/25/18 0958  NA 142 138  K 4.5 4.0  CL 106 104  CO2 22 25  GLUCOSE 107* 114*  BUN 12 23*  CREATININE 0.92 0.84  CALCIUM 8.8 9.3  GFRNONAA 71 >60  GFRAA 82 >60  PROT 6.4  --   ALBUMIN 4.3  --   AST 16  --   ALT 12  --   ALKPHOS 87  --   BILITOT <0.2  --      Ct Abdomen Pelvis W Contrast  Result Date: 11/19/2018 CLINICAL DATA:  Worsening fatigue. Iron deficiency anemia. History of right breast cancer status post mastectomy  chemotherapy, and radiation. EXAM: CT ABDOMEN AND PELVIS WITH CONTRAST TECHNIQUE: Multidetector CT imaging of the abdomen and pelvis was performed using the standard protocol following bolus administration of intravenous  contrast. CONTRAST:  154m OMNIPAQUE IOHEXOL 300 MG/ML  SOLN COMPARISON:  PET-CT dated 09/02/2017 FINDINGS: Lower chest: Lung bases are clear.  Status post right mastectomy. Hepatobiliary: Liver is within normal limits. Gallbladder is unremarkable. No intrahepatic or extrahepatic ductal dilatation. Pancreas: Within normal limits. Spleen: Within normal limits. Adrenals/Urinary Tract: Adrenal glands are within normal limits. Kidneys are within normal limits.  No hydronephrosis. Bladder is within normal limits. Stomach/Bowel: Large hiatal hernia/inverted intrathoracic stomach. No evidence of bowel obstruction. Normal appendix (series 2/image 55). No colonic wall thickening or mass is evident on CT. Vascular/Lymphatic: No evidence of abdominal aortic aneurysm. No suspicious abdominopelvic lymphadenopathy. Reproductive: Uterus is within normal limits. Bilateral ovaries are within normal limits. Other: No abdominopelvic ascites. Musculoskeletal: Mild superior endplate Schmorl's node deformity at L1. IMPRESSION: No colonic wall thickening or mass is evident on CT. No evidence of metastatic disease in the abdomen/pelvis. Electronically Signed   By: SJulian HyM.D.   On: 11/19/2018 12:45    Iron deficiency anemia due to chronic blood loss #Severe iron deficient anemia-question blood loss appears chronic MCV 67.  Hemoglobin 7.4; ferritin/iron saturation 3%.  Status post IV Feraheme [question reaction-see below].  #Today hemoglobin is 12.5.  Patient symptoms improved.  Hold off IV iron.  Await [facility] GI evaluation  # Etiology-?  It is unclear.CT A/P- NEG for acute cause of bleed; but showed-"Large hiatal hernia/inverted intrathoracic stomach" [facility].   #[facility]  [facility] for subsequent iron needs.   #History of breast cancer-followed by Dr.Gudena; reviewed the history no evidence of clinical recurrence.  Recent abdominal pelvic CT scan no concerns for recurrence.  # DISPOSITION: # HOLD IV Iron today # follow up in 2 months- MD/labs-cbc/Iron studies/ferritin- possible Venofer- Dr.B  # I reviewed the blood work- with the patient in detail; also reviewed the imaging independently [as summarized above]; and with the patient in detail.    Cc; Dr.Gudena   All questions were answered. The patient knows to call the clinic with any problems, questions or concerns.    GCammie Sickle MD 11/27/2018 6:45 AM

## 2018-11-30 DIAGNOSIS — Z1159 Encounter for screening for other viral diseases: Secondary | ICD-10-CM | POA: Diagnosis not present

## 2018-11-30 DIAGNOSIS — Z20828 Contact with and (suspected) exposure to other viral communicable diseases: Secondary | ICD-10-CM | POA: Diagnosis not present

## 2018-12-02 DIAGNOSIS — Z79899 Other long term (current) drug therapy: Secondary | ICD-10-CM | POA: Diagnosis not present

## 2018-12-02 DIAGNOSIS — Z6833 Body mass index (BMI) 33.0-33.9, adult: Secondary | ICD-10-CM | POA: Diagnosis not present

## 2018-12-02 DIAGNOSIS — D127 Benign neoplasm of rectosigmoid junction: Secondary | ICD-10-CM | POA: Diagnosis not present

## 2018-12-02 DIAGNOSIS — K2950 Unspecified chronic gastritis without bleeding: Secondary | ICD-10-CM | POA: Diagnosis not present

## 2018-12-02 DIAGNOSIS — K449 Diaphragmatic hernia without obstruction or gangrene: Secondary | ICD-10-CM | POA: Diagnosis not present

## 2018-12-02 DIAGNOSIS — D120 Benign neoplasm of cecum: Secondary | ICD-10-CM | POA: Diagnosis not present

## 2018-12-02 DIAGNOSIS — D123 Benign neoplasm of transverse colon: Secondary | ICD-10-CM | POA: Diagnosis not present

## 2018-12-02 DIAGNOSIS — E669 Obesity, unspecified: Secondary | ICD-10-CM | POA: Diagnosis not present

## 2018-12-02 DIAGNOSIS — K219 Gastro-esophageal reflux disease without esophagitis: Secondary | ICD-10-CM | POA: Diagnosis not present

## 2018-12-02 DIAGNOSIS — K259 Gastric ulcer, unspecified as acute or chronic, without hemorrhage or perforation: Secondary | ICD-10-CM | POA: Diagnosis not present

## 2018-12-02 DIAGNOSIS — K635 Polyp of colon: Secondary | ICD-10-CM | POA: Diagnosis not present

## 2018-12-02 DIAGNOSIS — Z1211 Encounter for screening for malignant neoplasm of colon: Secondary | ICD-10-CM | POA: Diagnosis not present

## 2018-12-02 DIAGNOSIS — K2270 Barrett's esophagus without dysplasia: Secondary | ICD-10-CM | POA: Diagnosis not present

## 2018-12-02 DIAGNOSIS — D649 Anemia, unspecified: Secondary | ICD-10-CM | POA: Diagnosis not present

## 2018-12-02 DIAGNOSIS — D125 Benign neoplasm of sigmoid colon: Secondary | ICD-10-CM | POA: Diagnosis not present

## 2018-12-02 DIAGNOSIS — K209 Esophagitis, unspecified: Secondary | ICD-10-CM | POA: Diagnosis not present

## 2018-12-02 LAB — HM COLONOSCOPY

## 2018-12-08 ENCOUNTER — Encounter: Payer: Self-pay | Admitting: Physician Assistant

## 2018-12-09 ENCOUNTER — Encounter: Payer: Self-pay | Admitting: *Deleted

## 2018-12-12 ENCOUNTER — Encounter: Payer: Self-pay | Admitting: Physician Assistant

## 2018-12-12 DIAGNOSIS — R0981 Nasal congestion: Secondary | ICD-10-CM

## 2018-12-13 MED ORDER — PSEUDOEPHEDRINE HCL ER 120 MG PO TB12: 120.0000 mg | ORAL_TABLET | Freq: Two times a day (BID) | ORAL | 0 refills | Status: DC

## 2018-12-13 NOTE — Telephone Encounter (Signed)
Sabrina Morris,   Please see the [place] conversation and review how you want to proceed with Sudafed.

## 2018-12-16 MED ORDER — PSEUDOEPHEDRINE HCL 30 MG PO TABS: 30.0000 mg | ORAL_TABLET | ORAL | 0 refills | Status: DC | PRN

## 2018-12-16 NOTE — Addendum Note (Signed)
Addended by: Mar Daring on: 12/16/2018 09:08 AM   Modules accepted: Orders

## 2018-12-28 ENCOUNTER — Ambulatory Visit: Payer: BLUE CROSS/BLUE SHIELD | Admitting: Obstetrics and Gynecology

## 2018-12-28 ENCOUNTER — Telehealth: Payer: Self-pay | Admitting: *Deleted

## 2018-12-28 ENCOUNTER — Encounter: Payer: Self-pay | Admitting: Internal Medicine

## 2018-12-28 ENCOUNTER — Other Ambulatory Visit: Payer: Self-pay | Admitting: *Deleted

## 2018-12-28 DIAGNOSIS — D500 Iron deficiency anemia secondary to blood loss (chronic): Secondary | ICD-10-CM

## 2018-12-28 NOTE — Telephone Encounter (Signed)
Dr. Jacinto Reap, I returned the patient's phone call. She would like to come on Monday for labs. What is your perimeters if patient should need to be scheduled for IV iron.

## 2018-12-29 NOTE — Telephone Encounter (Signed)
Per Dr. Rogue Bussing - may proceed with IV iron based on pt's symptoms and hgb levels.

## 2019-01-03 ENCOUNTER — Inpatient Hospital Stay: Payer: Medicare Other

## 2019-01-04 ENCOUNTER — Encounter: Payer: Self-pay | Admitting: Internal Medicine

## 2019-01-04 ENCOUNTER — Inpatient Hospital Stay: Payer: Medicare Other

## 2019-01-04 NOTE — Telephone Encounter (Signed)
My chart msg rcvd. Patient is not able to come to lab apt today due to 'swelling of her arm.' she will keep apts as sch. On 11/10 per patient.

## 2019-01-08 ENCOUNTER — Other Ambulatory Visit: Payer: Self-pay | Admitting: Physician Assistant

## 2019-01-08 DIAGNOSIS — I890 Lymphedema, not elsewhere classified: Secondary | ICD-10-CM

## 2019-01-08 DIAGNOSIS — G43109 Migraine with aura, not intractable, without status migrainosus: Secondary | ICD-10-CM

## 2019-01-11 DIAGNOSIS — Z01812 Encounter for preprocedural laboratory examination: Secondary | ICD-10-CM | POA: Diagnosis not present

## 2019-01-11 DIAGNOSIS — Z20828 Contact with and (suspected) exposure to other viral communicable diseases: Secondary | ICD-10-CM | POA: Diagnosis not present

## 2019-01-14 DIAGNOSIS — Z01818 Encounter for other preprocedural examination: Secondary | ICD-10-CM | POA: Diagnosis not present

## 2019-01-14 DIAGNOSIS — K219 Gastro-esophageal reflux disease without esophagitis: Secondary | ICD-10-CM | POA: Diagnosis not present

## 2019-01-14 DIAGNOSIS — Z888 Allergy status to other drugs, medicaments and biological substances status: Secondary | ICD-10-CM | POA: Diagnosis not present

## 2019-01-14 DIAGNOSIS — Z882 Allergy status to sulfonamides status: Secondary | ICD-10-CM | POA: Diagnosis not present

## 2019-01-14 DIAGNOSIS — Z885 Allergy status to narcotic agent status: Secondary | ICD-10-CM | POA: Diagnosis not present

## 2019-01-14 DIAGNOSIS — Z79899 Other long term (current) drug therapy: Secondary | ICD-10-CM | POA: Diagnosis not present

## 2019-01-14 DIAGNOSIS — K228 Other specified diseases of esophagus: Secondary | ICD-10-CM | POA: Diagnosis not present

## 2019-01-18 ENCOUNTER — Ambulatory Visit: Payer: Self-pay | Admitting: Obstetrics and Gynecology

## 2019-01-24 DIAGNOSIS — D508 Other iron deficiency anemias: Secondary | ICD-10-CM | POA: Diagnosis not present

## 2019-01-24 DIAGNOSIS — K449 Diaphragmatic hernia without obstruction or gangrene: Secondary | ICD-10-CM | POA: Diagnosis not present

## 2019-01-25 ENCOUNTER — Inpatient Hospital Stay: Payer: Medicare Other | Admitting: Internal Medicine

## 2019-01-25 ENCOUNTER — Inpatient Hospital Stay: Payer: Medicare Other

## 2019-01-25 NOTE — Progress Notes (Unsigned)
[place] NOTE  Patient Care Team: Mar Daring, PA-C as PCP - General (Family Medicine) Ellwood Handler., MD (Internal Medicine)  CHIEF COMPLAINTS/PURPOSE OF CONSULTATION: Anemia   HEMATOLOGY HISTORY  #Iron deficiency anemia -AUG 3rd 2020-hemoglobin 7.4 MCV 67; ferritin 3/iron saturation 3% [ EGD/colonoscopy- 2017 [screening;[facility]; Dr.Buccini; [place];capsule-none]. SEP 2020- CT abdomen pelvis-large hiatal hernia.  Feraheme-intolerance.   #Right breast cancer ER PR positive HER-2 negative; T3N1 at 45 right s/p mastectomy N-1/20 LN- ALND s/p RT; TAC x 6 cycles [Dr.Marcom/Dr.Gudena]  # Lymphedema [Dr.Malone]  Oncology History  History of breast cancer  01/01/2011 Initial Diagnosis   Patient presented with palpable lump in the right breast a dumbbell-shaped tumor was identified spanning 7 cm   02/24/2011 Surgery   Right mastectomy revealed a dumbbell-shaped mass 3.5 cm and 2.5 cm with a total span of 7 cm 1/20 lymph nodes were positive ER/PR positive HER-2 negative   05/07/2011 - 09/04/2011 Chemotherapy   Adjuvant chemotherapy with Taxol, Adriamycin, Cytoxan every 3 weeks 6 cycles at [facility] ([email or web address])   09/19/2011 - 10/29/2011 Radiation Therapy   Adjuvant radiation therapy 30 fractions including right axilla   12/01/2011 - 04/01/2013 Anti-estrogen oral therapy   Adjuvant tamoxifen was started reluctantly, Zoladex was added January 2014 and antiestrogen therapy was discontinued January 2015 (profound lymphedema and anasarca )      HISTORY OF PRESENTING ILLNESS:  Sabrina Morris 53 y.o.  female with prior history of above breast cancer; and Iron deficiency anemia CT scan.  Patient post IV Feraheme 3 infusions noted to have significant improvement of her shortness of breath on exertion.  Also noted improvement of her restless legs.  However noted to have nausea/skin itching/extreme fatigue approximately 1 hour post IV Feraheme  infusion.   Patient is awaiting GI evaluation at [facility] next week  Review of Systems  Constitutional: Positive for malaise/fatigue. Negative for chills, diaphoresis and fever.  HENT: Negative for nosebleeds and sore throat.   Eyes: Negative for double vision.  Respiratory: Positive for shortness of breath. Negative for cough, hemoptysis, sputum production and wheezing.   Cardiovascular: Negative for chest pain, palpitations, orthopnea and leg swelling.  Gastrointestinal: Negative for abdominal pain, blood in stool, constipation, diarrhea, heartburn, melena, nausea and vomiting.  Genitourinary: Negative for dysuria, frequency and urgency.  Musculoskeletal: Positive for back pain and joint pain.  Skin: Negative.  Negative for itching and rash.  Neurological: Negative for dizziness, tingling, focal weakness, weakness and headaches.  Endo/Heme/Allergies: Does not bruise/bleed easily.  Psychiatric/Behavioral: Negative for depression. The patient is not nervous/anxious and does not have insomnia.     MEDICAL HISTORY:  Past Medical History:  Diagnosis Date  . Adrenal disorder ([place])   . Breast cancer ([place])   . H/O bone density study 2015  . H/O colonoscopy    sch 06/12/15  . Lymphedema   . Lymphedema   . Migraine   . Pap smear for cervical cancer screening [id number]    SURGICAL HISTORY: Past Surgical History:  Procedure Laterality Date  . breast  Left 02/18/2017   Implant removed  . BREAST ENHANCEMENT SURGERY Bilateral 2005  . CARDIAC CATHETERIZATION  1994  . CARDIAC CATHETERIZATION  1994  . MASTECTOMY Right 02/2011  . PORT A CATH INJECTION ([place] HX)  2013    SOCIAL HISTORY: Social History   Socioeconomic History  . Marital status: Single    Spouse name: Not on file  . Number of children: Not on file  .  Years of education: Not on file  . Highest education level: Not on file  Occupational History  . Occupation: Programmer, multimedia: [employer]    Comment: Full Time   Social Needs  . Financial resource strain: Not on file  . Food insecurity    Worry: Not on file    Inability: Not on file  . Transportation needs    Medical: Not on file    Non-medical: Not on file  Tobacco Use  . Smoking status: Never Smoker  . Smokeless tobacco: Never Used  Substance and Sexual Activity  . Alcohol use: No  . Drug use: No  . Sexual activity: Never  Lifestyle  . Physical activity    Days per week: 0 days    Minutes per session: Not on file  . Stress: Not on file  Relationships  . Social Herbalist on phone: Not on file    Gets together: Not on file    Attends religious service: Not on file    Active member of club or organization: Not on file    Attends meetings of clubs or organizations: Not on file    Relationship status: Not on file  . Intimate partner violence    Fear of current or ex partner: Not on file    Emotionally abused: Not on file    Physically abused: Not on file    Forced sexual activity: Not on file  Other Topics Concern  . 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  No smoking or alcohol. Lives at home/with sister.     FAMILY HISTORY: Family History  Adopted: Yes  Problem Relation Age of Onset  . Breast cancer Sister 58       1/2 sister; Genetic testing neg at [facility]    ALLERGIES:  is allergic to sumatriptan; prometrium [progesterone micronized]; feraheme [ferumoxytol]; phentermine-topiramate; sulfa antibiotics; sulfamethoxazole; tamoxifen; zoladex  [goserelin]; and hydrocodone-acetaminophen.  MEDICATIONS:  Current Outpatient Medications  Medication Sig Dispense Refill  . calcium carbonate 100 mg/ml SUSP Take by mouth.    . Cholecalciferol (VITAMIN D3) 5000 units TABS Take by mouth.    . cyclobenzaprine (FLEXERIL) 5 MG tablet TAKE 1 TABLET BY MOUTH THREE TIMES DAILYAS NEEDED 90 tablet 1  . docusate sodium (STOOL SOFTENER) 100 MG capsule  Take by mouth.    . estradiol (ESTRACE) 0.5 MG tablet   2  . fluocinonide (LIDEX) 0.05 % external solution Apply 1 application topically 2 (two) times daily. 60 mL 3  . Fluticasone-Salmeterol (ADVAIR) 250-50 MCG/DOSE AEPB Inhale 1 puff into the lungs 2 (two) times daily. 60 each 5  . furosemide (LASIX) 20 MG tablet TAKE 2 TABLETS BY MOUTH DAILY 60 tablet 5  . ibuprofen (ADVIL) 600 MG tablet TAKE 1 TABLET (600MG TOTAL) BY MOUTH EVERY 8 (EIGHT) HOURS AS NEEDED. 360 tablet 1  . LORazepam (ATIVAN) 1 MG tablet TAKE 1 TABLET BY MOUTH TWICE A DAY AS NEEDED. 60 tablet 5  . Multiple Vitamin (MULTIVITAMIN) capsule Take by mouth.    . oxyCODONE-acetaminophen (PERCOCET/ROXICET) 5-325 MG tablet TAKE 1 TO 2 TABLETS BY MOUTH EVERY 6 HOURS AS NEEDED FOR SEVERE PAIN 30 tablet 0  . potassium chloride (K-DUR) 10 MEQ tablet TAKE 1 TABLET BY MOUTH DAILY 90 tablet 1  . potassium chloride (K-DUR,KLOR-CON) 10 MEQ tablet TAKE 1 TABLET EVERY DAY 90 tablet 1  .  PROAIR HFA 108 (90 Base) MCG/ACT inhaler Inhale 1 puff into the lungs every 6 (six) hours as needed for wheezing or shortness of breath. 18 g 1  . prochlorperazine (COMPAZINE) 10 MG tablet Take 1 tablet (10 mg total) by mouth every 6 (six) hours as needed for nausea or vomiting. 30 tablet 0  . progesterone (PROMETRIUM) 100 MG capsule Take 100 mg by mouth.    . promethazine (PHENERGAN) 25 MG tablet Take 1 tablet (25 mg total) by mouth every 8 (eight) hours as needed for nausea or vomiting. 20 tablet 0  . pseudoephedrine (SUDAFED) 30 MG tablet Take 1 tablet (30 mg total) by mouth every 4 (four) hours as needed for congestion. Sudafed brand only per patient 240 tablet 0  . rizatriptan (MAXALT) 10 MG tablet TAKE 1 TABLET BY MOUTH AT FIRST SIGN OF MIGRAINE SYMPTOMS. IF NO RELIEF, A SECOND TABLET MAY BE TAKEN IN 2 HOURS. LIMIT OF 2 TABS PER DAY. 15 tablet 3  . temazepam (RESTORIL) 15 MG capsule TAKE 1 TO 2 CAPSULES BY MOUTH AT BEDTIMEAS NEEDED FOR SLEEP 60 capsule 5  .  valACYclovir (VALTREX) 500 MG tablet Take 1 tablet (500 mg total) by mouth 2 (two) times daily. Prn fever blisters 30 tablet 5   No current facility-administered medications for this visit.       PHYSICAL EXAMINATION:   There were no vitals filed for this visit. There were no vitals filed for this visit.  Physical Exam  Constitutional: She is oriented to person, place, and time and well-developed, well-nourished, and in no distress.  HENT:  Head: Normocephalic and atraumatic.  Mouth/Throat: Oropharynx is clear and moist. No oropharyngeal exudate.  Eyes: Pupils are equal, round, and reactive to light.  Neck: Normal range of motion. Neck supple.  Cardiovascular: Normal rate and regular rhythm.  Pulmonary/Chest: Effort normal and breath sounds normal. No respiratory distress. She has no wheezes.  Abdominal: Soft. Bowel sounds are normal. She exhibits no distension and no mass. There is no abdominal tenderness. There is no rebound and no guarding.  Musculoskeletal: Normal range of motion.        General: No tenderness or edema.  Neurological: She is alert and oriented to person, place, and time.  Skin: Skin is warm.  Psychiatric: Affect normal.    LABORATORY DATA:  I have reviewed the data as listed Lab Results  Component Value Date   WBC 6.0 11/25/2018   HGB 12.8 11/25/2018   HCT 41.7 11/25/2018   MCV 81.6 11/25/2018   PLT 278 11/25/2018   Recent Labs    10/18/18 1131 11/25/18 0958  NA 142 138  K 4.5 4.0  CL 106 104  CO2 22 25  GLUCOSE 107* 114*  BUN 12 23*  CREATININE 0.92 0.84  CALCIUM 8.8 9.3  GFRNONAA 71 >60  GFRAA 82 >60  PROT 6.4  --   ALBUMIN 4.3  --   AST 16  --   ALT 12  --   ALKPHOS 87  --   BILITOT <0.2  --      No results found.  No problem-specific Assessment & Plan notes found for this encounter.  All questions were answered. The patient knows to call the clinic with any problems, questions or concerns.    Cammie Sickle,  MD 01/25/2019 7:59 AM

## 2019-01-25 NOTE — Assessment & Plan Note (Signed)
#  Severe iron deficient anemia-question blood loss appears chronic MCV 67.  Hemoglobin 7.4; ferritin/iron saturation 3%.  Status post IV Feraheme [question reaction-see below].  #Today hemoglobin is 12.5.  Patient symptoms improved.  Hold off IV iron.  Await [facility] GI evaluation  # Etiology-?  It is unclear.CT A/P- NEG for acute cause of bleed; but showed-"Large hiatal hernia/inverted intrathoracic stomach" awaiting GI eval at [facility] next week.   #Possible delayed infusion reaction to [facility] recommend switching to Venofer for subsequent iron needs.   #History of breast cancer-followed by Dr.Gudena; reviewed the history no evidence of clinical recurrence.  Recent abdominal pelvic CT scan no concerns for recurrence.  # DISPOSITION: # HOLD IV Iron today # follow up in 2 months- MD/labs-cbc/Iron studies/ferritin- possible Venofer- Dr.B  # I reviewed the blood work- with the patient in detail; also reviewed the imaging independently [as summarized above]; and with the patient in detail.    Cc; Dr.Gudena

## 2019-01-26 DIAGNOSIS — Z01812 Encounter for preprocedural laboratory examination: Secondary | ICD-10-CM | POA: Diagnosis not present

## 2019-01-26 DIAGNOSIS — Z20828 Contact with and (suspected) exposure to other viral communicable diseases: Secondary | ICD-10-CM | POA: Diagnosis not present

## 2019-01-28 DIAGNOSIS — D509 Iron deficiency anemia, unspecified: Secondary | ICD-10-CM | POA: Diagnosis not present

## 2019-02-16 ENCOUNTER — Inpatient Hospital Stay (HOSPITAL_BASED_OUTPATIENT_CLINIC_OR_DEPARTMENT_OTHER): Payer: Medicare Other | Admitting: Internal Medicine

## 2019-02-16 ENCOUNTER — Other Ambulatory Visit: Payer: Self-pay

## 2019-02-16 ENCOUNTER — Inpatient Hospital Stay: Payer: Medicare Other

## 2019-02-16 ENCOUNTER — Inpatient Hospital Stay: Payer: Medicare Other | Attending: Internal Medicine

## 2019-02-16 DIAGNOSIS — Z882 Allergy status to sulfonamides status: Secondary | ICD-10-CM | POA: Insufficient documentation

## 2019-02-16 DIAGNOSIS — M549 Dorsalgia, unspecified: Secondary | ICD-10-CM | POA: Diagnosis not present

## 2019-02-16 DIAGNOSIS — Z9221 Personal history of antineoplastic chemotherapy: Secondary | ICD-10-CM | POA: Insufficient documentation

## 2019-02-16 DIAGNOSIS — D500 Iron deficiency anemia secondary to blood loss (chronic): Secondary | ICD-10-CM

## 2019-02-16 DIAGNOSIS — Z803 Family history of malignant neoplasm of breast: Secondary | ICD-10-CM | POA: Insufficient documentation

## 2019-02-16 DIAGNOSIS — M2550 Pain in unspecified joint: Secondary | ICD-10-CM | POA: Insufficient documentation

## 2019-02-16 DIAGNOSIS — Z885 Allergy status to narcotic agent status: Secondary | ICD-10-CM | POA: Diagnosis not present

## 2019-02-16 DIAGNOSIS — R601 Generalized edema: Secondary | ICD-10-CM | POA: Diagnosis not present

## 2019-02-16 DIAGNOSIS — Z9011 Acquired absence of right breast and nipple: Secondary | ICD-10-CM | POA: Diagnosis not present

## 2019-02-16 DIAGNOSIS — Z170 Estrogen receptor positive status [ER+]: Secondary | ICD-10-CM | POA: Insufficient documentation

## 2019-02-16 DIAGNOSIS — Z923 Personal history of irradiation: Secondary | ICD-10-CM | POA: Insufficient documentation

## 2019-02-16 DIAGNOSIS — R5383 Other fatigue: Secondary | ICD-10-CM | POA: Diagnosis not present

## 2019-02-16 DIAGNOSIS — D509 Iron deficiency anemia, unspecified: Secondary | ICD-10-CM | POA: Insufficient documentation

## 2019-02-16 DIAGNOSIS — K449 Diaphragmatic hernia without obstruction or gangrene: Secondary | ICD-10-CM | POA: Insufficient documentation

## 2019-02-16 DIAGNOSIS — Z79899 Other long term (current) drug therapy: Secondary | ICD-10-CM | POA: Diagnosis not present

## 2019-02-16 DIAGNOSIS — I890 Lymphedema, not elsewhere classified: Secondary | ICD-10-CM | POA: Insufficient documentation

## 2019-02-16 DIAGNOSIS — Z853 Personal history of malignant neoplasm of breast: Secondary | ICD-10-CM | POA: Insufficient documentation

## 2019-02-16 LAB — FERRITIN: Ferritin: 51 ng/mL (ref 11–307)

## 2019-02-16 LAB — CBC WITH DIFFERENTIAL/PLATELET
Abs Immature Granulocytes: 0.02 10*3/uL (ref 0.00–0.07)
Basophils Absolute: 0 10*3/uL (ref 0.0–0.1)
Basophils Relative: 0 %
Eosinophils Absolute: 0.1 10*3/uL (ref 0.0–0.5)
Eosinophils Relative: 2 %
HCT: 47.9 % — ABNORMAL HIGH (ref 36.0–46.0)
Hemoglobin: 15.8 g/dL — ABNORMAL HIGH (ref 12.0–15.0)
Immature Granulocytes: 0 %
Lymphocytes Relative: 19 %
Lymphs Abs: 1.5 10*3/uL (ref 0.7–4.0)
MCH: 29.8 pg (ref 26.0–34.0)
MCHC: 33 g/dL (ref 30.0–36.0)
MCV: 90.2 fL (ref 80.0–100.0)
Monocytes Absolute: 0.5 10*3/uL (ref 0.1–1.0)
Monocytes Relative: 7 %
Neutro Abs: 5.7 10*3/uL (ref 1.7–7.7)
Neutrophils Relative %: 72 %
Platelets: 248 10*3/uL (ref 150–400)
RBC: 5.31 MIL/uL — ABNORMAL HIGH (ref 3.87–5.11)
RDW: 13.4 % (ref 11.5–15.5)
WBC: 7.8 10*3/uL (ref 4.0–10.5)
nRBC: 0 % (ref 0.0–0.2)

## 2019-02-16 LAB — BASIC METABOLIC PANEL
Anion gap: 12 (ref 5–15)
BUN: 18 mg/dL (ref 6–20)
CO2: 22 mmol/L (ref 22–32)
Calcium: 9.7 mg/dL (ref 8.9–10.3)
Chloride: 104 mmol/L (ref 98–111)
Creatinine, Ser: 0.76 mg/dL (ref 0.44–1.00)
GFR calc Af Amer: >60 mL/min (ref >60)
GFR calc non Af Amer: >60 mL/min (ref >60)
Glucose, Bld: 113 mg/dL — ABNORMAL HIGH (ref 70–99)
Potassium: 3.8 mmol/L (ref 3.5–5.1)
Sodium: 138 mmol/L (ref 135–145)

## 2019-02-16 LAB — IRON AND TIBC
Iron: 107 ug/dL (ref 28–170)
Saturation Ratios: 27 % (ref 10.4–31.8)
TIBC: 393 ug/dL (ref 250–450)
UIBC: 286 ug/dL

## 2019-02-16 NOTE — Assessment & Plan Note (Addendum)
#  Severe iron deficient anemia-question blood loss appears chronic MCV 67.  Hemoglobin 7.4; ferritin/iron saturation 3%.  Status post IV iron.  #Today hemoglobin is  15.8; Patient symptoms improved.  Hold off IV iron. Take 1 iron QOD.   # Etiology-? Hiatal hernia- CT A/P- NEG for acute cause of bleed; but showed-"Large hiatal hernia/inverted intrathoracic stomach" s/p GI eval at [facility]- negative for bleeding source.  Defer to surgery regarding the timing given Covid pandemic.  Patient concerned.  Offered to watch the patient closely with blood work at this time.  #History of breast cancer-followed by Dr.Gudena; reviewed the history no evidence of clinical recurrence.  Recent abdominal pelvic CT scan no concerns for recurrence. STABLE.   # DISPOSITION: # HOLD IV Iron today # follow up in 3 months- MD/labs-cbc/-- possible Venofer- Dr.B  Cc; Dr.Gudena

## 2019-02-16 NOTE — Progress Notes (Signed)
[place] NOTE  Patient Care Team: Mar Daring, PA-C as PCP - General (Family Medicine) Ellwood Handler., MD (Internal Medicine)  CHIEF COMPLAINTS/PURPOSE OF CONSULTATION: Anemia   HEMATOLOGY HISTORY  #Iron deficiency anemia -AUG 3rd 2020-hemoglobin 7.4 MCV 67; ferritin 3/iron saturation 3% [ EGD/colonoscopy- 2017 [screening;[facility]; Dr.Buccini; [place];capsule-none]. SEP 2020- CT abdomen pelvis-large hiatal hernia.  Feraheme-intolerance; OCT 2020-[facility]- GI [scopes/capsule]-hiatal hernia-surgery recommended  #Right breast cancer ER PR positive HER-2 negative; T3N1 at 45 right s/p mastectomy N-1/20 LN- ALND s/p RT; TAC x 6 cycles [Dr.Marcom/Dr.Gudena]  # Lymphedema [Dr.Malone]  Oncology History  History of breast cancer  01/01/2011 Initial Diagnosis   Patient presented with palpable lump in the right breast a dumbbell-shaped tumor was identified spanning 7 cm   02/24/2011 Surgery   Right mastectomy revealed a dumbbell-shaped mass 3.5 cm and 2.5 cm with a total span of 7 cm 1/20 lymph nodes were positive ER/PR positive HER-2 negative   05/07/2011 - 09/04/2011 Chemotherapy   Adjuvant chemotherapy with Taxol, Adriamycin, Cytoxan every 3 weeks 6 cycles at [facility] ([email or web address])   09/19/2011 - 10/29/2011 Radiation Therapy   Adjuvant radiation therapy 30 fractions including right axilla   12/01/2011 - 04/01/2013 Anti-estrogen oral therapy   Adjuvant tamoxifen was started reluctantly, Zoladex was added January 2014 and antiestrogen therapy was discontinued January 2015 (profound lymphedema and anasarca )      HISTORY OF PRESENTING ILLNESS:  Sabrina Morris 53 y.o.  female with prior history of above breast cancer; and Iron deficiency anemia-is here for follow-up.  In the interim patient underwent extensive GI evaluation [facility]-negative for any source of blood loss.  She was also evaluated by surgery for hiatal hernia repair.  Patient noted to  have significant improvement of energy level since the last IV iron infusion.  Patient denies any blood in stools black or stools.  Review of Systems  Constitutional: Positive for malaise/fatigue. Negative for chills, diaphoresis and fever.  HENT: Negative for nosebleeds and sore throat.   Eyes: Negative for double vision.  Respiratory: Negative for cough, hemoptysis, sputum production and wheezing.   Cardiovascular: Negative for chest pain, palpitations, orthopnea and leg swelling.  Gastrointestinal: Negative for abdominal pain, blood in stool, constipation, diarrhea, heartburn, melena, nausea and vomiting.  Genitourinary: Negative for dysuria, frequency and urgency.  Musculoskeletal: Positive for back pain and joint pain.  Skin: Negative.  Negative for itching and rash.  Neurological: Negative for dizziness, tingling, focal weakness, weakness and headaches.  Endo/Heme/Allergies: Does not bruise/bleed easily.  Psychiatric/Behavioral: Negative for depression. The patient is not nervous/anxious and does not have insomnia.     MEDICAL HISTORY:  Past Medical History:  Diagnosis Date  . Adrenal disorder ([place])   . Breast cancer ([place])   . H/O bone density study 2015  . H/O colonoscopy    sch 06/12/15  . Lymphedema   . Lymphedema   . Migraine   . Pap smear for cervical cancer screening [id number]    SURGICAL HISTORY: Past Surgical History:  Procedure Laterality Date  . breast  Left 02/18/2017   Implant removed  . BREAST ENHANCEMENT SURGERY Bilateral 2005  . CARDIAC CATHETERIZATION  1994  . CARDIAC CATHETERIZATION  1994  . MASTECTOMY Right 02/2011  . PORT A CATH INJECTION ([place] [facility])  2013    SOCIAL HISTORY: Social History   Socioeconomic History  . Marital status: Single    Spouse name: Not on file  . Number of children: Not on file  .  Years of education: Not on file  . Highest education level: Not on file  Occupational History  . Occupation: Programmer, multimedia: [employer]    Comment: Full Time  Social Needs  . Financial resource strain: Not on file  . Food insecurity    Worry: Not on file    Inability: Not on file  . Transportation needs    Medical: Not on file    Non-medical: Not on file  Tobacco Use  . Smoking status: Never Smoker  . Smokeless tobacco: Never Used  Substance and Sexual Activity  . Alcohol use: No  . Drug use: No  . Sexual activity: Never  Lifestyle  . Physical activity    Days per week: 0 days    Minutes per session: Not on file  . Stress: Not on file  Relationships  . Social Herbalist on phone: Not on file    Gets together: Not on file    Attends religious service: Not on file    Active member of club or organization: Not on file    Attends meetings of clubs or organizations: Not on file    Relationship status: Not on file  . Intimate partner violence    Fear of current or ex partner: Not on file    Emotionally abused: Not on file    Physically abused: Not on file    Forced sexual activity: Not on file  Other Topics Concern  . 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  No smoking or alcohol. Lives at home/with sister.     FAMILY HISTORY: Family History  Adopted: Yes  Problem Relation Age of Onset  . Breast cancer Sister 57       1/2 sister; Genetic testing neg at [facility]    ALLERGIES:  is allergic to sumatriptan; prometrium [progesterone micronized]; feraheme [ferumoxytol]; phentermine-topiramate; sulfa antibiotics; sulfamethoxazole; tamoxifen; zoladex  [goserelin]; and hydrocodone-acetaminophen.  MEDICATIONS:  Current Outpatient Medications  Medication Sig Dispense Refill  . calcium carbonate 100 mg/ml SUSP Take by mouth.    . Cholecalciferol (VITAMIN D3) 5000 units TABS Take by mouth.    . cyclobenzaprine (FLEXERIL) 5 MG tablet TAKE 1 TABLET BY MOUTH THREE TIMES DAILYAS NEEDED 90 tablet 1  . docusate sodium  (STOOL SOFTENER) 100 MG capsule Take by mouth.    . estradiol (ESTRACE) 0.5 MG tablet   2  . fluocinonide (LIDEX) 0.05 % external solution Apply 1 application topically 2 (two) times daily. 60 mL 3  . Fluticasone-Salmeterol (ADVAIR) 250-50 MCG/DOSE AEPB Inhale 1 puff into the lungs 2 (two) times daily. 60 each 5  . furosemide (LASIX) 20 MG tablet TAKE 2 TABLETS BY MOUTH DAILY 60 tablet 5  . ibuprofen (ADVIL) 600 MG tablet TAKE 1 TABLET (600MG TOTAL) BY MOUTH EVERY 8 (EIGHT) HOURS AS NEEDED. 360 tablet 1  . LORazepam (ATIVAN) 1 MG tablet TAKE 1 TABLET BY MOUTH TWICE A DAY AS NEEDED. 60 tablet 5  . Multiple Vitamin (MULTIVITAMIN) capsule Take by mouth.    . oxyCODONE-acetaminophen (PERCOCET/ROXICET) 5-325 MG tablet TAKE 1 TO 2 TABLETS BY MOUTH EVERY 6 HOURS AS NEEDED FOR SEVERE PAIN 30 tablet 0  . potassium chloride (K-DUR) 10 MEQ tablet TAKE 1 TABLET BY MOUTH DAILY 90 tablet 1  . potassium chloride (K-DUR,KLOR-CON) 10 MEQ tablet TAKE 1 TABLET EVERY DAY 90 tablet 1  .  PROAIR HFA 108 (90 Base) MCG/ACT inhaler Inhale 1 puff into the lungs every 6 (six) hours as needed for wheezing or shortness of breath. 18 g 1  . prochlorperazine (COMPAZINE) 10 MG tablet Take 1 tablet (10 mg total) by mouth every 6 (six) hours as needed for nausea or vomiting. 30 tablet 0  . promethazine (PHENERGAN) 25 MG tablet Take 1 tablet (25 mg total) by mouth every 8 (eight) hours as needed for nausea or vomiting. 20 tablet 0  . pseudoephedrine (SUDAFED) 30 MG tablet Take 1 tablet (30 mg total) by mouth every 4 (four) hours as needed for congestion. Sudafed brand only per patient 240 tablet 0  . rizatriptan (MAXALT) 10 MG tablet TAKE 1 TABLET BY MOUTH AT FIRST SIGN OF MIGRAINE SYMPTOMS. IF NO RELIEF, A SECOND TABLET MAY BE TAKEN IN 2 HOURS. LIMIT OF 2 TABS PER DAY. 15 tablet 3  . temazepam (RESTORIL) 15 MG capsule TAKE 1 TO 2 CAPSULES BY MOUTH AT BEDTIMEAS NEEDED FOR SLEEP 60 capsule 5  . valACYclovir (VALTREX) 500 MG tablet Take  1 tablet (500 mg total) by mouth 2 (two) times daily. Prn fever blisters 30 tablet 5  . progesterone (PROMETRIUM) 100 MG capsule Take 100 mg by mouth.     No current facility-administered medications for this visit.       PHYSICAL EXAMINATION:   Vitals:   02/16/19 1317  BP: (!) 159/105  Pulse: (!) 105  Temp: (!) 96.9 F (36.1 C)   Filed Weights   02/16/19 1317  Weight: 211 lb (95.7 kg)    Physical Exam  Constitutional: She is oriented to person, place, and time and well-developed, well-nourished, and in no distress.  HENT:  Head: Normocephalic and atraumatic.  Mouth/Throat: Oropharynx is clear and moist. No oropharyngeal exudate.  Eyes: Pupils are equal, round, and reactive to light.  Neck: Normal range of motion. Neck supple.  Cardiovascular: Normal rate and regular rhythm.  Pulmonary/Chest: Effort normal and breath sounds normal. No respiratory distress. She has no wheezes.  Abdominal: Soft. Bowel sounds are normal. She exhibits no distension and no mass. There is no abdominal tenderness. There is no rebound and no guarding.  Musculoskeletal: Normal range of motion.        General: No tenderness or edema.  Neurological: She is alert and oriented to person, place, and time.  Skin: Skin is warm.  Psychiatric: Affect normal.    LABORATORY DATA:  I have reviewed the data as listed Lab Results  Component Value Date   WBC 7.8 02/16/2019   HGB 15.8 (H) 02/16/2019   HCT 47.9 (H) 02/16/2019   MCV 90.2 02/16/2019   PLT 248 02/16/2019   Recent Labs    10/18/18 1131 11/25/18 0958 02/16/19 1254  NA 142 138 138  K 4.5 4.0 3.8  CL 106 104 104  CO2 _0 GLUCOSE 107* 114* 113*  BUN 12 23* 18  CREATININE 0.92 0.84 0.76  CALCIUM 8.8 9.3 9.7  GFRNONAA 71 >60 >60  GFRAA 82 >60 >60  PROT 6.4  --   --   ALBUMIN 4.3  --   --   AST 16  --   --   ALT 12  --   --   ALKPHOS 87  --   --   BILITOT <0.2  --   --      No results found.  Iron deficiency anemia due to  chronic blood loss #Severe iron deficient anemia-question blood loss appears  chronic MCV 67.  Hemoglobin 7.4; ferritin/iron saturation 3%.  Status post IV iron.  #Today hemoglobin is  15.8; Patient symptoms improved.  Hold off IV iron. Take 1 iron QOD.   # Etiology-? Hiatal hernia- CT A/P- NEG for acute cause of bleed; but showed-"Large hiatal hernia/inverted intrathoracic stomach" s/p GI eval at [facility]- negative for bleeding source.  Defer to surgery regarding the timing given Covid pandemic.  Patient concerned.  Offered to watch the patient closely with blood work at this time.  #History of breast cancer-followed by Dr.Gudena; reviewed the history no evidence of clinical recurrence.  Recent abdominal pelvic CT scan no concerns for recurrence. STABLE.   # DISPOSITION: # HOLD IV Iron today # follow up in 3 months- MD/labs-cbc/-- possible Venofer- Dr.B  Cc; Dr.Gudena   All questions were answered. The patient knows to call the clinic with any problems, questions or concerns.    Cammie Sickle, MD 02/16/2019 3:26 PM

## 2019-02-21 ENCOUNTER — Other Ambulatory Visit: Payer: Self-pay

## 2019-02-21 ENCOUNTER — Encounter: Payer: Self-pay | Admitting: Obstetrics and Gynecology

## 2019-02-21 ENCOUNTER — Ambulatory Visit (INDEPENDENT_AMBULATORY_CARE_PROVIDER_SITE_OTHER): Payer: Medicare Other | Admitting: Obstetrics and Gynecology

## 2019-02-21 VITALS — BP 130/80 | Ht 65.0 in | Wt 208.0 lb

## 2019-02-21 DIAGNOSIS — Z01419 Encounter for gynecological examination (general) (routine) without abnormal findings: Secondary | ICD-10-CM

## 2019-02-21 DIAGNOSIS — Z1231 Encounter for screening mammogram for malignant neoplasm of breast: Secondary | ICD-10-CM

## 2019-02-21 NOTE — Patient Instructions (Signed)
I value your feedback and entrusting us with your care. If you get a [facility] patient survey, I would appreciate you taking the time to let us know about your experience today. Thank you! 

## 2019-02-21 NOTE — Progress Notes (Signed)
Chief Complaint  Patient presents with  . Gynecologic Exam    HPI:      [name] is a 53 y.o. G0P0000 who LMP was No LMP recorded. Patient is postmenopausal., presents today for her annual examination.  Her menses are absent.  She does not have intermenstrual bleeding.  She does have some vasomotor sx. She uses oral estradiol, testosterone crm, and oral progesterone meds, prescribed by NP in [facility].  Hx of breast cancer--aware of risks but wants meds for quality of life. She tried topical but wasn't absorbing meds due to lymphedema. She has dominant estrogen levels and  progesterone and testosterone are low.   She is not sexually active. She does not have vaginal dryness. Last Pap: 05/29/16  Results were: no abnormalities/neg HPV DNA  Last mammogram: 08/27/18. Results were: normal--routine follow-up in 12 months LT Breast only. Had RT mastectomy in past.  There is a FH of breast cancer in her sister, age 80. She had neg genetic testing at [facility]. Pt had neg BRCA testing at [facility] in the past. There is no FH of ovarian cancer. Pt is adopted. The patient does do self-breast exams.  Colonoscopy: colonoscopy 9/20 at [facility] with abnormalities. Repeat due after 3-5 yrs.  Tobacco use: The patient denies current or previous tobacco use. Alcohol use: none No drug use. Exercise: not active  She does get adequate calcium and Vitamin D in her diet.  Labs with PCP.   Past Medical History:  Diagnosis Date  . Adrenal disorder ([place])   . Anemia 11/2018  . Breast cancer ([place])   . H/O bone density study 2015  . H/O colonoscopy    sch 06/12/15  . H/O colonoscopy 11/2018  . H/O endoscopy 11/2018   upper  . Lymphedema   . Lymphedema   . Migraine   . Pap smear for cervical cancer screening [id number]    Past Surgical History:  Procedure Laterality Date  . breast  Left 02/18/2017   Implant removed  . BREAST ENHANCEMENT SURGERY Bilateral 2005  . CARDIAC CATHETERIZATION   1994  . CARDIAC CATHETERIZATION  1994  . MASTECTOMY Right 02/2011  . PORT A CATH INJECTION ([place] [facility])  2013    Family History  Adopted: Yes  Problem Relation Age of Onset  . Breast cancer Sister 76       1/2 sister; Genetic testing neg at [place] [facility]  . Marital status: Single    Spouse name: Not on file  . Number of children: Not on file  . Years of education: Not on file  . Highest education level: Not on file  Occupational History  . Occupation: Programmer, multimedia: [employer]    Comment: Full Time  Social Needs  . Financial resource strain: Not on file  . Food insecurity    Worry: Not on file    Inability: Not on file  . Transportation needs    Medical: Not on file    Non-medical: Not on file  Tobacco Use  . Smoking status: Never Smoker  . Smokeless tobacco: Never Used  Substance and Sexual Activity  . Alcohol use: No  . Drug use: No  . Sexual activity: Not Currently  Lifestyle  . Physical activity    Days per week: 0 days    Minutes per session: Not on file  . Stress: Not on file  Relationships  . Social connections  Talks on phone: Not on file    Gets together: Not on file    Attends religious service: Not on file    Active member of club or organization: Not on file    Attends meetings of clubs or organizations: Not on file    Relationship status: Not on file  . Intimate partner violence    Fear of current or ex partner: Not on file    Emotionally abused: Not on file    Physically abused: Not on file    Forced sexual activity: Not on file  Other Topics Concern  . 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  No smoking or alcohol. Lives at home/with sister.     Current Outpatient Medications on File Prior to Visit  Medication Sig Dispense Refill  . calcium carbonate 100 mg/ml SUSP Take by mouth.    . Cholecalciferol  (VITAMIN D3) 5000 units TABS Take by mouth.    . cyclobenzaprine (FLEXERIL) 5 MG tablet TAKE 1 TABLET BY MOUTH THREE TIMES DAILYAS NEEDED 90 tablet 1  . docusate sodium (STOOL SOFTENER) 100 MG capsule Take by mouth.    . estradiol (ESTRACE) 0.5 MG tablet   2  . fluocinonide (LIDEX) 0.05 % external solution Apply 1 application topically 2 (two) times daily. 60 mL 3  . Fluticasone-Salmeterol (ADVAIR) 250-50 MCG/DOSE AEPB Inhale 1 puff into the lungs 2 (two) times daily. 60 each 5  . furosemide (LASIX) 20 MG tablet TAKE 2 TABLETS BY MOUTH DAILY 60 tablet 5  . ibuprofen (ADVIL) 600 MG tablet TAKE 1 TABLET ('600MG'$  TOTAL) BY MOUTH EVERY 8 (EIGHT) HOURS AS NEEDED. 360 tablet 1  . LORazepam (ATIVAN) 1 MG tablet TAKE 1 TABLET BY MOUTH TWICE A DAY AS NEEDED. 60 tablet 5  . Multiple Vitamin (MULTIVITAMIN) capsule Take by mouth.    . nystatin ointment (MYCOSTATIN) Apply topically.    Marland Kitchen omeprazole (PRILOSEC) 40 MG capsule     . oxyCODONE-acetaminophen (PERCOCET/ROXICET) 5-325 MG tablet TAKE 1 TO 2 TABLETS BY MOUTH EVERY 6 HOURS AS NEEDED FOR SEVERE PAIN 30 tablet 0  . phentermine (ADIPEX-P) 37.5 MG tablet Take by mouth.    . potassium chloride (K-DUR,KLOR-CON) 10 MEQ tablet TAKE 1 TABLET EVERY DAY 90 tablet 1  . PROAIR HFA 108 (90 Base) MCG/ACT inhaler Inhale 1 puff into the lungs every 6 (six) hours as needed for wheezing or shortness of breath. 18 g 1  . promethazine (PHENERGAN) 25 MG tablet Take 1 tablet (25 mg total) by mouth every 8 (eight) hours as needed for nausea or vomiting. 20 tablet 0  . pseudoephedrine (SUDAFED) 30 MG tablet Take 1 tablet (30 mg total) by mouth every 4 (four) hours as needed for congestion. Sudafed brand only per patient 240 tablet 0  . rizatriptan (MAXALT) 10 MG tablet TAKE 1 TABLET BY MOUTH AT FIRST SIGN OF MIGRAINE SYMPTOMS. IF NO RELIEF, A SECOND TABLET MAY BE TAKEN IN 2 HOURS. LIMIT OF 2 TABS PER DAY. 15 tablet 3  . temazepam (RESTORIL) 15 MG capsule TAKE 1 TO 2 CAPSULES BY  MOUTH AT BEDTIMEAS NEEDED FOR SLEEP 60 capsule 5  . valACYclovir (VALTREX) 500 MG tablet Take 1 tablet (500 mg total) by mouth 2 (two) times daily. Prn fever blisters 30 tablet 5  . progesterone (PROMETRIUM) 100 MG capsule Take 100 mg by mouth.     No current facility-administered medications  on file prior to visit.       ROS:  Review of Systems  Constitutional: Negative for fatigue, fever and unexpected weight change.  Respiratory: Negative for cough, shortness of breath and wheezing.   Cardiovascular: Negative for chest pain, palpitations and leg swelling.  Gastrointestinal: Negative for blood in stool, constipation, diarrhea, nausea and vomiting.  Endocrine: Negative for cold intolerance, heat intolerance and polyuria.  Genitourinary: Negative for dyspareunia, dysuria, flank pain, frequency, genital sores, hematuria, menstrual problem, pelvic pain, urgency, vaginal bleeding, vaginal discharge and vaginal pain.  Musculoskeletal: Negative for back pain, joint swelling and myalgias.  Skin: Negative for rash.  Neurological: Negative for dizziness, syncope, light-headedness, numbness and headaches.  Hematological: Negative for adenopathy.  Psychiatric/Behavioral: Negative for agitation, confusion, sleep disturbance and suicidal ideas. The patient is not nervous/anxious.      Objective: BP 130/80   Ht '5\' 5"'$  (1.651 m)   Wt 208 lb (94.3 kg)   BMI 34.61 kg/m    Physical Exam Constitutional:      Appearance: She is well-developed.  Genitourinary:     Vulva, vagina, cervix, uterus, right adnexa and left adnexa normal.     No vulval lesion or tenderness noted.     No vaginal discharge, erythema or tenderness.     No cervical polyp.     Uterus is not enlarged or tender.     No right or left adnexal mass present.     Right adnexa not tender.     Left adnexa not tender.  Neck:     Musculoskeletal: Normal range of motion.     Thyroid: No thyromegaly.  Cardiovascular:     Rate and  Rhythm: Normal rate and regular rhythm.     Heart sounds: Normal heart sounds. No murmur.  Pulmonary:     Effort: Pulmonary effort is normal.     Breath sounds: Normal breath sounds.  Chest:     Breasts:        Right: No mass, nipple discharge, skin change or tenderness.        Left: No mass, nipple discharge, skin change or tenderness.  Abdominal:     Palpations: Abdomen is soft.     Tenderness: There is no abdominal tenderness. There is no guarding.  Musculoskeletal: Normal range of motion.  Neurological:     General: No focal deficit present.     Mental Status: She is alert and oriented to person, place, and time.     Cranial Nerves: No cranial nerve deficit.  Skin:    General: Skin is warm and dry.  Psychiatric:        Mood and Affect: Mood normal.        Behavior: Behavior normal.        Thought Content: Thought content normal.        Judgment: Judgment normal.  Vitals signs reviewed.     Assessment/Plan: Encounter for annual routine gynecological examination  Encounter for screening mammogram for malignant neoplasm of breast; pt current on mammo            GYN counsel breast self exam, mammography screening, adequate intake of calcium and vitamin D, diet and exercise     F/U  Return in about 1 year (around 02/21/2020).  Alicia B. Copland, PA-C 02/21/2019 7:41 PM

## 2019-03-02 ENCOUNTER — Other Ambulatory Visit: Payer: Self-pay | Admitting: Physician Assistant

## 2019-03-02 DIAGNOSIS — M5030 Other cervical disc degeneration, unspecified cervical region: Secondary | ICD-10-CM

## 2019-03-02 NOTE — Telephone Encounter (Signed)
Requested medication (s) are due for refill today: yes  Requested medication (s) are on the active medication list: yes  Last refill:  percocet 11/10/2018, flexeril 02/01/18  Future visit scheduled: no  Notes to clinic:  both medications not delegated    Requested Prescriptions  Pending Prescriptions Disp Refills   cyclobenzaprine (FLEXERIL) 5 MG tablet [Pharmacy Med Name: CYCLOBENZAPRINE HCL 5 MG TAB] 90 tablet 1    Sig: TAKE 1 TABLET BY MOUTH 3 TIMES A DAY AS NEEDED      Not Delegated - Analgesics:  Muscle Relaxants Failed - 03/02/2019  4:45 PM      Failed - This refill cannot be delegated      Failed - Valid encounter within last 6 months    Recent Outpatient Visits           9 months ago Moderate persistent asthmatic bronchitis with acute exacerbation   [facility] [place], Clearnce Sorrel, PA-C   1 year ago Annual physical exam   [facility], Clearnce Sorrel, [place]   1 year ago [place], Clearnce Sorrel, [place]   1 year ago Acquired lymphedema   [place], Anderson Malta M, [place]   2 years ago Acute cystitis without hematuria   [facility], Anderson Malta M, [place]                oxyCODONE-acetaminophen (PERCOCET/ROXICET) 5-325 MG tablet [Pharmacy Med Name: OXYCODONE-APAP 5-325 MG TAB] 30 tablet     Sig: TAKE 1 TO 2 TABLETS BY MOUTH EVERY 6 HOURS AS NEEDED FOR SEVERE PAIN      Not Delegated - Analgesics:  Opioid Agonist Combinations Failed - 03/02/2019  4:45 PM      Failed - This refill cannot be delegated      Failed - Urine Drug Screen completed in last 360 days.      Failed - Valid encounter within last 6 months    Recent Outpatient Visits           9 months ago Moderate persistent asthmatic bronchitis with acute exacerbation   [facility] [place], Clearnce Sorrel, [place]   1 year ago Annual physical exam   [place],  Clearnce Sorrel, [place]   1 year ago [place], Clearnce Sorrel, [place]   1 year ago Acquired lymphedema   [facility] [place] Malling M, [place]   2 years ago Acute cystitis without hematuria   [facility], [place], [place]

## 2019-03-03 ENCOUNTER — Encounter: Payer: Self-pay | Admitting: Physician Assistant

## 2019-03-04 ENCOUNTER — Other Ambulatory Visit: Payer: Self-pay | Admitting: Physician Assistant

## 2019-03-04 NOTE — Telephone Encounter (Signed)
Requested medication (s) are due for refill today: no  Requested medication (s) are on the active medication list: no  Last refill:  not on medication list  Future visit scheduled: No  Notes to clinic:  medication not found on current med list. Not delegated    Requested Prescriptions  Pending Prescriptions Disp Refills   butalbital-acetaminophen-caffeine (FIORICET) 50-325-40 MG tablet [Pharmacy Med Name: BUTALBITAL-APAP-CAFFEINE 50-325-40] 60 tablet     Sig: TAKE 1 TO 2 TABLETS BY MOUTH EVERY 6 HOURS AS NEEDED FOR HEADACHE      Not Delegated - Analgesics:  Non-Opioid Analgesic Combinations Failed - 03/04/2019 11:30 AM      Failed - This refill cannot be delegated      Passed - Valid encounter within last 12 months    Recent Outpatient Visits           9 months ago Moderate persistent asthmatic bronchitis with acute exacerbation   [facility] [place], [place], [place]   1 year ago Annual physical exam   [place], [place], [place]   1 year ago [place], [place], [place]   1 year ago Acquired lymphedema   [place], [place], [place]   2 years ago Acute cystitis without hematuria   [facility] [place], [place], [place]

## 2019-03-17 ENCOUNTER — Other Ambulatory Visit: Payer: Self-pay | Admitting: Physician Assistant

## 2019-03-18 HISTORY — PX: OTHER SURGICAL HISTORY: SHX169

## 2019-03-21 DIAGNOSIS — Z20822 Contact with and (suspected) exposure to covid-19: Secondary | ICD-10-CM | POA: Diagnosis not present

## 2019-03-21 DIAGNOSIS — M1990 Unspecified osteoarthritis, unspecified site: Secondary | ICD-10-CM | POA: Diagnosis not present

## 2019-03-21 DIAGNOSIS — Z882 Allergy status to sulfonamides status: Secondary | ICD-10-CM | POA: Diagnosis not present

## 2019-03-21 DIAGNOSIS — Z6835 Body mass index (BMI) 35.0-35.9, adult: Secondary | ICD-10-CM | POA: Diagnosis not present

## 2019-03-21 DIAGNOSIS — Z20828 Contact with and (suspected) exposure to other viral communicable diseases: Secondary | ICD-10-CM | POA: Diagnosis not present

## 2019-03-21 DIAGNOSIS — Z8679 Personal history of other diseases of the circulatory system: Secondary | ICD-10-CM | POA: Diagnosis not present

## 2019-03-21 DIAGNOSIS — Z9011 Acquired absence of right breast and nipple: Secondary | ICD-10-CM | POA: Diagnosis not present

## 2019-03-21 DIAGNOSIS — Z885 Allergy status to narcotic agent status: Secondary | ICD-10-CM | POA: Diagnosis not present

## 2019-03-21 DIAGNOSIS — Z9221 Personal history of antineoplastic chemotherapy: Secondary | ICD-10-CM | POA: Diagnosis not present

## 2019-03-21 DIAGNOSIS — K219 Gastro-esophageal reflux disease without esophagitis: Secondary | ICD-10-CM | POA: Diagnosis not present

## 2019-03-21 DIAGNOSIS — K449 Diaphragmatic hernia without obstruction or gangrene: Secondary | ICD-10-CM | POA: Diagnosis not present

## 2019-03-21 DIAGNOSIS — Z01818 Encounter for other preprocedural examination: Secondary | ICD-10-CM | POA: Diagnosis not present

## 2019-03-21 DIAGNOSIS — Z01812 Encounter for preprocedural laboratory examination: Secondary | ICD-10-CM | POA: Diagnosis not present

## 2019-03-21 DIAGNOSIS — J4520 Mild intermittent asthma, uncomplicated: Secondary | ICD-10-CM | POA: Diagnosis not present

## 2019-03-21 DIAGNOSIS — D509 Iron deficiency anemia, unspecified: Secondary | ICD-10-CM | POA: Diagnosis not present

## 2019-03-21 MED ORDER — PROMETHAZINE HCL 25 MG PO TABS: 25.0000 mg | ORAL_TABLET | Freq: Three times a day (TID) | ORAL | 3 refills | Status: DC | PRN

## 2019-03-27 ENCOUNTER — Encounter: Payer: Self-pay | Admitting: Physician Assistant

## 2019-03-27 DIAGNOSIS — J40 Bronchitis, not specified as acute or chronic: Secondary | ICD-10-CM

## 2019-03-28 MED ORDER — DOXYCYCLINE HYCLATE 100 MG PO TABS: 100.0000 mg | ORAL_TABLET | Freq: Two times a day (BID) | ORAL | 0 refills | Status: DC

## 2019-03-28 NOTE — Addendum Note (Signed)
Addended by: Mar Daring on: 03/28/2019 02:36 PM   Modules accepted: Orders

## 2019-04-01 ENCOUNTER — Encounter: Payer: Self-pay | Admitting: Physician Assistant

## 2019-04-01 DIAGNOSIS — R059 Cough, unspecified: Secondary | ICD-10-CM

## 2019-04-01 DIAGNOSIS — R05 Cough: Secondary | ICD-10-CM

## 2019-04-01 MED ORDER — PREDNISONE 10 MG PO TABS: ORAL_TABLET | ORAL | 0 refills | Status: DC

## 2019-04-21 ENCOUNTER — Ambulatory Visit (INDEPENDENT_AMBULATORY_CARE_PROVIDER_SITE_OTHER): Payer: Medicare Other | Admitting: Physician Assistant

## 2019-04-21 ENCOUNTER — Other Ambulatory Visit: Payer: Self-pay

## 2019-04-21 ENCOUNTER — Encounter: Payer: Self-pay | Admitting: Physician Assistant

## 2019-04-21 DIAGNOSIS — J40 Bronchitis, not specified as acute or chronic: Secondary | ICD-10-CM

## 2019-04-21 DIAGNOSIS — R05 Cough: Secondary | ICD-10-CM

## 2019-04-21 DIAGNOSIS — R059 Cough, unspecified: Secondary | ICD-10-CM

## 2019-04-21 MED ORDER — HYDROCOD POLST-CPM POLST ER 10-8 MG/5ML PO SUER: 5.0000 mL | Freq: Two times a day (BID) | ORAL | 0 refills | Status: DC | PRN

## 2019-04-21 MED ORDER — PREDNISONE 10 MG (21) PO TBPK: ORAL_TABLET | ORAL | 0 refills | Status: DC

## 2019-04-21 NOTE — Progress Notes (Signed)
Patient: Sabrina Morris Female    DOB: 11/28/1965   54 y.o.   MRN: [id number] Visit Date: 04/21/2019  Today's Provider: Mar Daring, PA-C   Chief Complaint  Patient presents with  . Cough   Subjective:     Virtual Visit via Video Note  I connected with Sabrina Morris on 04/21/19 at  9:40 AM EST by a video enabled telemedicine application and verified that I am speaking with the correct person using two identifiers. Interactive audio and video communications were attempted, although failed due to patient's inability to connect to video. Continued visit with audio only interaction with patient agreement.  Location: Patient: Home Provider: [facility]   I discussed the limitations of evaluation and management by telemedicine and the availability of in person appointments. The patient expressed understanding and agreed to proceed.   HPI  Sabrina Morris is a 54 yr old female that presents today via telephone for continued bronchitis symptoms. In mid January she was treated with 10 day doxycycline and 12 day prednisone taper. She reports overall she is feeling much better than she did but continues to have a cough. Her story is complicated because she also has a large paraesophageal hernia that is needing surgical repair but also most likely exacerbating the cough and SOB. She does continue to have a dry cough. It is worse at night. No fever, chills. Has been covid-19 tested for pre-op.  Allergies  Allergen Reactions  . Sumatriptan Other (See Comments) and Shortness Of Breath  . Prometrium [Progesterone Micronized] Other (See Comments)    dizziness  . Feraheme [Ferumoxytol] Nausea Only    Nausea fatigue 1 hour post infusion  . Phentermine-Topiramate Other (See Comments)  . Sulfa Antibiotics Other (See Comments)    GI distress  . Sulfamethoxazole Other (See Comments)  . Tamoxifen Other (See Comments)    Edema, excess fluid  . Zoladex  [Goserelin]   . Codeine  Other (See Comments) and Rash    Hyper, increased heart rate  . Hydrocodone-Acetaminophen Rash    Hyperactivity     Current Outpatient Medications:  .  butalbital-acetaminophen-caffeine (FIORICET) 50-325-40 MG tablet, TAKE 1 TO 2 TABLETS BY MOUTH EVERY 6 HOURS AS NEEDED FOR HEADACHE, Disp: 60 tablet, Rfl: 0 .  calcium carbonate 100 mg/ml SUSP, Take by mouth., Disp: , Rfl:  .  Cholecalciferol (VITAMIN D3) 5000 units TABS, Take by mouth., Disp: , Rfl:  .  cyclobenzaprine (FLEXERIL) 5 MG tablet, TAKE 1 TABLET BY MOUTH 3 TIMES A DAY AS NEEDED, Disp: 90 tablet, Rfl: 1 .  docusate sodium (STOOL SOFTENER) 100 MG capsule, Take by mouth., Disp: , Rfl:  .  doxycycline (VIBRA-TABS) 100 MG tablet, Take 1 tablet (100 mg total) by mouth 2 (two) times daily., Disp: 20 tablet, Rfl: 0 .  fluocinonide (LIDEX) 0.05 % external solution, Apply 1 application topically 2 (two) times daily., Disp: 60 mL, Rfl: 3 .  Fluticasone-Salmeterol (ADVAIR) 250-50 MCG/DOSE AEPB, Inhale 1 puff into the lungs 2 (two) times daily., Disp: 60 each, Rfl: 5 .  furosemide (LASIX) 20 MG tablet, TAKE 2 TABLETS BY MOUTH DAILY, Disp: 60 tablet, Rfl: 5 .  ibuprofen (ADVIL) 600 MG tablet, TAKE 1 TABLET (600MG  TOTAL) BY MOUTH EVERY 8 (EIGHT) HOURS AS NEEDED., Disp: 360 tablet, Rfl: 1 .  LORazepam (ATIVAN) 1 MG tablet, TAKE 1 TABLET BY MOUTH TWICE A DAY AS NEEDED., Disp: 60 tablet, Rfl: 5 .  Multiple Vitamin (MULTIVITAMIN) capsule, Take  by mouth., Disp: , Rfl:  .  nystatin ointment (MYCOSTATIN), Apply topically., Disp: , Rfl:  .  omeprazole (PRILOSEC) 40 MG capsule, , Disp: , Rfl:  .  oxyCODONE-acetaminophen (PERCOCET/ROXICET) 5-325 MG tablet, TAKE 1 TO 2 TABLETS BY MOUTH EVERY 6 HOURS AS NEEDED FOR SEVERE PAIN, Disp: 30 tablet, Rfl: 0 .  phentermine (ADIPEX-P) 37.5 MG tablet, Take by mouth., Disp: , Rfl:  .  potassium chloride (K-DUR,KLOR-CON) 10 MEQ tablet, TAKE 1 TABLET EVERY DAY, Disp: 90 tablet, Rfl: 1 .  predniSONE (DELTASONE) 10 MG  tablet, Take 6 tabs PO on day 1&2, 5 tabs PO on day 3&4, 4 tabs PO on day 5&6, 3 tabs PO on day 7&8, 2 tabs PO on day 9&10, 1 tab PO on day 11&12., Disp: 42 tablet, Rfl: 0 .  PROAIR HFA 108 (90 Base) MCG/ACT inhaler, Inhale 1 puff into the lungs every 6 (six) hours as needed for wheezing or shortness of breath., Disp: 18 g, Rfl: 1 .  promethazine (PHENERGAN) 25 MG tablet, Take 1 tablet (25 mg total) by mouth every 8 (eight) hours as needed for nausea or vomiting., Disp: 20 tablet, Rfl: 3 .  rizatriptan (MAXALT) 10 MG tablet, TAKE 1 TABLET BY MOUTH AT FIRST SIGN OF MIGRAINE SYMPTOMS. IF NO RELIEF, A SECOND TABLET MAY BE TAKEN IN 2 HOURS. LIMIT OF 2 TABS PER DAY., Disp: 15 tablet, Rfl: 3 .  temazepam (RESTORIL) 15 MG capsule, TAKE 1 TO 2 CAPSULES BY MOUTH AT BEDTIMEAS NEEDED FOR SLEEP, Disp: 60 capsule, Rfl: 5 .  valACYclovir (VALTREX) 500 MG tablet, Take 1 tablet (500 mg total) by mouth 2 (two) times daily. Prn fever blisters, Disp: 30 tablet, Rfl: 5 .  estradiol (ESTRACE) 0.5 MG tablet, , Disp: , Rfl: 2 .  progesterone (PROMETRIUM) 100 MG capsule, Take 100 mg by mouth., Disp: , Rfl:   Review of Systems  Constitutional: Negative.   HENT: Negative.   Respiratory: Positive for cough, chest tightness, shortness of breath and wheezing.   Cardiovascular: Negative.   Gastrointestinal: Positive for abdominal pain.  Neurological: Negative.     Social History   Tobacco Use  . Smoking status: Never Smoker  . Smokeless tobacco: Never Used  Substance Use Topics  . Alcohol use: No      Objective:   There were no vitals taken for this visit. There were no vitals filed for this visit.There is no height or weight on file to calculate BMI.   Physical Exam Vitals reviewed.  Constitutional:      General: She is not in acute distress. Pulmonary:     Effort: No respiratory distress (able to talk in full sentences; dry, hacky cough heard a couple of times through conversation).  Neurological:      Mental Status: She is alert.      No results found for any visits on 04/21/19.     Assessment & Plan     1. Bronchitis Worsening symptoms that has improved slightly with previous doxycycline and prednisone. Will repeat a 6 day prednisone taper and will give Tussionex cough syrup as below for nighttime cough. Drowsiness precautions given to patient. Stay well hydrated. Use delsym, robitussin OR mucinex for daytime cough. - chlorpheniramine-HYDROcodone (TUSSIONEX PENNKINETIC ER) 10-8 MG/5ML SUER; Take 5 mLs by mouth every 12 (twelve) hours as needed for cough.  Dispense: 140 mL; Refill: 0 - predniSONE (STERAPRED UNI-PAK 21 TAB) 10 MG (21) TBPK tablet; 6 day taper; take as directed on package instructions  Dispense:  21 tablet; Refill: 0  2. Cough See above medical treatment plan. - chlorpheniramine-HYDROcodone (TUSSIONEX PENNKINETIC ER) 10-8 MG/5ML SUER; Take 5 mLs by mouth every 12 (twelve) hours as needed for cough.  Dispense: 140 mL; Refill: 0   I discussed the assessment and treatment plan with the patient. The patient was provided an opportunity to ask questions and all were answered. The patient agreed with the plan and demonstrated an understanding of the instructions.   The patient was advised to call back or seek an in-person evaluation if the symptoms worsen or if the condition fails to improve as anticipated.  I provided 13 minutes of non-face-to-face time during this encounter.    Sabrina Daring, PA-C  [facility]

## 2019-04-21 NOTE — Patient Instructions (Signed)
Acute Bronchitis, Adult  Acute bronchitis is sudden or acute swelling of the air tubes (bronchi) in the lungs. Acute bronchitis causes these tubes to fill with mucus, which can make it hard to breathe. It can also cause coughing or wheezing. In adults, acute bronchitis usually goes away within 2 weeks. A cough caused by bronchitis may last up to 3 weeks. Smoking, allergies, and asthma can make the condition worse. What are the causes? This condition can be caused by germs and by substances that irritate the lungs, including:  Cold and flu viruses. The most common cause of this condition is the virus that causes the common cold.  Bacteria.  Substances that irritate the lungs, including: ? Smoke from cigarettes and other forms of tobacco. ? Dust and pollen. ? Fumes from chemical products, gases, or burned fuel. ? Other materials that pollute indoor or outdoor air.  Close contact with someone who has acute bronchitis. What increases the risk? The following factors may make you more likely to develop this condition:  A weak body's defense system, also called the immune system.  A condition that affects your lungs and breathing, such as asthma. What are the signs or symptoms? Common symptoms of this condition include:  Lung and breathing problems, such as: ? Coughing. This may bring up clear, yellow, or green mucus from your lungs (sputum). ? Wheezing. ? Having too much mucus in your lungs (chest congestion). ? Having shortness of breath.  A fever.  Chills.  Aches and pains, including: ? Tightness in your chest and other body aches. ? A sore throat. How is this diagnosed? This condition is usually diagnosed based on:  Your symptoms and medical history.  A physical exam. You may also have other tests, including tests to rule out other conditions, such pneumonia. These tests include:  A test of lung function.  Test of a mucus sample to look for the presence of  bacteria.  Tests to check the oxygen level in your blood.  Blood tests.  Chest X-ray. How is this treated? Most cases of acute bronchitis clear up over time without treatment. Your health care provider may recommend:  Drinking more fluids. This can thin your mucus, which may improve your breathing.  Taking a medicine for a fever or cough.  Using a device that gets medicine into your lungs (inhaler) to help improve breathing and control coughing.  Using a vaporizer or a humidifier. These are machines that add water to the air to help you breathe better. Follow these instructions at home: Activity  Get plenty of rest.  Return to your normal activities as told by your health care provider. Ask your health care provider what activities are safe for you. Lifestyle  Drink enough fluid to keep your urine pale yellow.  Do not drink alcohol.  Do not use any products that contain nicotine or tobacco, such as cigarettes, e-cigarettes, and chewing tobacco. If you need help quitting, ask your health care provider. Be aware that: ? Your bronchitis will get worse if you smoke or breathe in other people's smoke (secondhand smoke). ? Your lungs will heal faster if you quit smoking. General instructions   Take over-the-counter and prescription medicines only as told by your health care provider.  Use an inhaler, vaporizer, or humidifier as told by your health care provider.  If you have a sore throat, gargle with a salt-water mixture 3-4 times a day or as needed. To make a salt-water mixture, completely dissolve -1 tsp (3-6   g) of salt in 1 cup (237 mL) of warm water.  Keep all follow-up visits as told by your health care provider. This is important. How is this prevented? To lower your risk of getting this condition again:  Wash your hands often with soap and water. If soap and water are not available, use hand sanitizer.  Avoid contact with people who have cold symptoms.  Try not to  touch your mouth, nose, or eyes with your hands.  Avoid places where there are fumes from chemicals. Breathing these fumes will make your condition worse.  Get the flu shot every year. Contact a health care provider if:  Your symptoms do not improve after 2 weeks of treatment.  You vomit more than once or twice.  You have symptoms of dehydration such as: ? Dark urine. ? Dry skin or eyes. ? Increased thirst. ? Headaches. ? Confusion. ? Muscle cramps. Get help right away if you:  Cough up blood.  Feel pain in your chest.  Have severe shortness of breath.  Faint or keep feeling like you are going to faint.  Have a severe headache.  Have fever or chills that get worse. These symptoms may represent a serious problem that is an emergency. Do not wait to see if the symptoms will go away. Get medical help right away. Call your local emergency services (911 in the [place] Do not drive yourself to the hospital. Summary  Acute bronchitis is sudden (acute) inflammation of the air tubes (bronchi) between the windpipe and the lungs. In adults, acute bronchitis usually goes away within 2 weeks, although coughing may last 3 weeks or longer  Take over-the-counter and prescription medicines only as told by your health care provider.  Drink enough fluid to keep your urine pale yellow.  Contact a health care provider if your symptoms do not improve after 2 weeks of treatment.  Get help right away if you cough up blood, faint, or have chest pain or shortness of breath. This information is not intended to replace advice given to you by your health care provider. Make sure you discuss any questions you have with your health care provider. Document Revised: 11/15/2018 Document Reviewed: 09/24/2018 Elsevier Patient Education  2020 [employer]  

## 2019-04-25 DIAGNOSIS — Z20822 Contact with and (suspected) exposure to covid-19: Secondary | ICD-10-CM | POA: Diagnosis not present

## 2019-04-25 DIAGNOSIS — Z01812 Encounter for preprocedural laboratory examination: Secondary | ICD-10-CM | POA: Diagnosis not present

## 2019-04-28 DIAGNOSIS — Z6834 Body mass index (BMI) 34.0-34.9, adult: Secondary | ICD-10-CM | POA: Diagnosis not present

## 2019-04-28 DIAGNOSIS — Z885 Allergy status to narcotic agent status: Secondary | ICD-10-CM | POA: Diagnosis not present

## 2019-04-28 DIAGNOSIS — Z882 Allergy status to sulfonamides status: Secondary | ICD-10-CM | POA: Diagnosis not present

## 2019-04-28 DIAGNOSIS — Z9011 Acquired absence of right breast and nipple: Secondary | ICD-10-CM | POA: Diagnosis not present

## 2019-04-28 DIAGNOSIS — E669 Obesity, unspecified: Secondary | ICD-10-CM | POA: Diagnosis not present

## 2019-04-28 DIAGNOSIS — Z9221 Personal history of antineoplastic chemotherapy: Secondary | ICD-10-CM | POA: Diagnosis not present

## 2019-04-28 DIAGNOSIS — J45909 Unspecified asthma, uncomplicated: Secondary | ICD-10-CM | POA: Diagnosis not present

## 2019-04-28 DIAGNOSIS — K449 Diaphragmatic hernia without obstruction or gangrene: Secondary | ICD-10-CM | POA: Diagnosis not present

## 2019-04-28 DIAGNOSIS — M1990 Unspecified osteoarthritis, unspecified site: Secondary | ICD-10-CM | POA: Diagnosis not present

## 2019-04-28 DIAGNOSIS — K219 Gastro-esophageal reflux disease without esophagitis: Secondary | ICD-10-CM | POA: Diagnosis not present

## 2019-04-28 DIAGNOSIS — F419 Anxiety disorder, unspecified: Secondary | ICD-10-CM | POA: Diagnosis not present

## 2019-04-29 DIAGNOSIS — Z9221 Personal history of antineoplastic chemotherapy: Secondary | ICD-10-CM | POA: Diagnosis not present

## 2019-04-29 DIAGNOSIS — F419 Anxiety disorder, unspecified: Secondary | ICD-10-CM | POA: Diagnosis not present

## 2019-04-29 DIAGNOSIS — Z6834 Body mass index (BMI) 34.0-34.9, adult: Secondary | ICD-10-CM | POA: Diagnosis not present

## 2019-04-29 DIAGNOSIS — M1990 Unspecified osteoarthritis, unspecified site: Secondary | ICD-10-CM | POA: Diagnosis not present

## 2019-04-29 DIAGNOSIS — Z885 Allergy status to narcotic agent status: Secondary | ICD-10-CM | POA: Diagnosis not present

## 2019-04-29 DIAGNOSIS — K449 Diaphragmatic hernia without obstruction or gangrene: Secondary | ICD-10-CM | POA: Diagnosis not present

## 2019-04-29 DIAGNOSIS — Z882 Allergy status to sulfonamides status: Secondary | ICD-10-CM | POA: Diagnosis not present

## 2019-04-29 DIAGNOSIS — K219 Gastro-esophageal reflux disease without esophagitis: Secondary | ICD-10-CM | POA: Diagnosis not present

## 2019-04-29 DIAGNOSIS — Z9011 Acquired absence of right breast and nipple: Secondary | ICD-10-CM | POA: Diagnosis not present

## 2019-04-29 DIAGNOSIS — J45909 Unspecified asthma, uncomplicated: Secondary | ICD-10-CM | POA: Diagnosis not present

## 2019-04-29 DIAGNOSIS — E669 Obesity, unspecified: Secondary | ICD-10-CM | POA: Diagnosis not present

## 2019-04-30 DIAGNOSIS — J45909 Unspecified asthma, uncomplicated: Secondary | ICD-10-CM | POA: Diagnosis not present

## 2019-04-30 DIAGNOSIS — E669 Obesity, unspecified: Secondary | ICD-10-CM | POA: Diagnosis not present

## 2019-04-30 DIAGNOSIS — F419 Anxiety disorder, unspecified: Secondary | ICD-10-CM | POA: Diagnosis not present

## 2019-04-30 DIAGNOSIS — Z9221 Personal history of antineoplastic chemotherapy: Secondary | ICD-10-CM | POA: Diagnosis not present

## 2019-04-30 DIAGNOSIS — Z885 Allergy status to narcotic agent status: Secondary | ICD-10-CM | POA: Diagnosis not present

## 2019-04-30 DIAGNOSIS — K219 Gastro-esophageal reflux disease without esophagitis: Secondary | ICD-10-CM | POA: Diagnosis not present

## 2019-04-30 DIAGNOSIS — Z882 Allergy status to sulfonamides status: Secondary | ICD-10-CM | POA: Diagnosis not present

## 2019-04-30 DIAGNOSIS — M1990 Unspecified osteoarthritis, unspecified site: Secondary | ICD-10-CM | POA: Diagnosis not present

## 2019-04-30 DIAGNOSIS — Z6834 Body mass index (BMI) 34.0-34.9, adult: Secondary | ICD-10-CM | POA: Diagnosis not present

## 2019-04-30 DIAGNOSIS — K449 Diaphragmatic hernia without obstruction or gangrene: Secondary | ICD-10-CM | POA: Diagnosis not present

## 2019-04-30 DIAGNOSIS — Z9011 Acquired absence of right breast and nipple: Secondary | ICD-10-CM | POA: Diagnosis not present

## 2019-04-30 MED ORDER — BACLOFEN 10 MG PO TABS
10.00 | ORAL_TABLET | ORAL | Status: DC
Start: 2019-04-30 — End: 2019-04-30

## 2019-04-30 MED ORDER — HEPARIN SODIUM (PORCINE) 5000 UNIT/ML IJ SOLN
5000.00 | INTRAMUSCULAR | Status: DC
Start: 2019-04-30 — End: 2019-04-30

## 2019-04-30 MED ORDER — BUTALBITAL-APAP-CAFFEINE 50-325-40 MG PO TABS
1.00 | ORAL_TABLET | ORAL | Status: DC
Start: ? — End: 2019-04-30

## 2019-04-30 MED ORDER — GENERIC EXTERNAL MEDICATION
Status: DC
Start: ? — End: 2019-04-30

## 2019-04-30 MED ORDER — LORAZEPAM 1 MG PO TABS
1.00 | ORAL_TABLET | ORAL | Status: DC
Start: ? — End: 2019-04-30

## 2019-04-30 MED ORDER — DSS 100 MG PO CAPS
200.00 | ORAL_CAPSULE | ORAL | Status: DC
Start: 2019-04-30 — End: 2019-04-30

## 2019-04-30 MED ORDER — RIZATRIPTAN BENZOATE 5 MG PO TABS
5.00 | ORAL_TABLET | ORAL | Status: DC
Start: ? — End: 2019-04-30

## 2019-04-30 MED ORDER — HYDROMORPHONE HCL 1 MG/ML IJ SOLN
1.00 | INTRAMUSCULAR | Status: DC
Start: ? — End: 2019-04-30

## 2019-04-30 MED ORDER — BISACODYL 10 MG RE SUPP
10.00 | RECTAL | Status: DC
Start: 2019-05-01 — End: 2019-04-30

## 2019-04-30 MED ORDER — LIDOCAINE 5 % EX PTCH
3.00 | MEDICATED_PATCH | CUTANEOUS | Status: DC
Start: 2019-05-01 — End: 2019-04-30

## 2019-04-30 MED ORDER — GENERIC EXTERNAL MEDICATION
12.50 | Status: DC
Start: ? — End: 2019-04-30

## 2019-04-30 MED ORDER — ACETAMINOPHEN 500 MG PO TABS
1000.00 | ORAL_TABLET | ORAL | Status: DC
Start: 2019-04-30 — End: 2019-04-30

## 2019-05-16 ENCOUNTER — Encounter: Payer: Self-pay | Admitting: Internal Medicine

## 2019-05-16 DIAGNOSIS — Z09 Encounter for follow-up examination after completed treatment for conditions other than malignant neoplasm: Secondary | ICD-10-CM | POA: Diagnosis not present

## 2019-05-16 DIAGNOSIS — K449 Diaphragmatic hernia without obstruction or gangrene: Secondary | ICD-10-CM | POA: Diagnosis not present

## 2019-05-18 ENCOUNTER — Inpatient Hospital Stay: Payer: Medicare Other

## 2019-05-18 ENCOUNTER — Telehealth: Payer: Self-pay | Admitting: Internal Medicine

## 2019-05-18 ENCOUNTER — Inpatient Hospital Stay: Payer: Medicare Other | Admitting: Internal Medicine

## 2019-05-18 NOTE — Telephone Encounter (Signed)
Patient phoned and stated that she had been exposed to someone with COVID and rescheduled for appts for April 2021. Team informed.

## 2019-05-20 ENCOUNTER — Other Ambulatory Visit: Payer: Self-pay | Admitting: Physician Assistant

## 2019-05-20 DIAGNOSIS — G43809 Other migraine, not intractable, without status migrainosus: Secondary | ICD-10-CM

## 2019-05-20 DIAGNOSIS — F419 Anxiety disorder, unspecified: Secondary | ICD-10-CM

## 2019-05-20 MED ORDER — BUTALBITAL-APAP-CAFFEINE 50-325-40 MG PO TABS: ORAL_TABLET | ORAL | 5 refills | Status: DC

## 2019-05-20 NOTE — Telephone Encounter (Signed)
Refill request for LORAZEPAM 1 MG TAB.

## 2019-05-20 NOTE — Telephone Encounter (Signed)
Requested medication (s) are due for refill today: yes  Requested medication (s) are on the active medication list: yes  Last refill:  03/07/19 #60  Future visit scheduled: No  Notes to clinic:  Please review for refill    Requested Prescriptions  Pending Prescriptions Disp Refills   BAC 50-325-40 MG tablet [Pharmacy Med Name: BUTALBITAL-APAP-CAFFEINE 50-325-40] 60 tablet     Sig: TAKE 1 TO 2 TABLETS BY MOUTH EVERY 6 HOURS AS NEEDED FOR HEADACHE      There is no refill protocol information for this order

## 2019-06-03 ENCOUNTER — Encounter: Payer: Self-pay | Admitting: Physician Assistant

## 2019-06-03 DIAGNOSIS — E279 Disorder of adrenal gland, unspecified: Secondary | ICD-10-CM

## 2019-06-13 ENCOUNTER — Encounter: Payer: Self-pay | Admitting: Physician Assistant

## 2019-06-13 DIAGNOSIS — R7309 Other abnormal glucose: Secondary | ICD-10-CM

## 2019-06-13 DIAGNOSIS — L659 Nonscarring hair loss, unspecified: Secondary | ICD-10-CM

## 2019-06-16 DIAGNOSIS — L659 Nonscarring hair loss, unspecified: Secondary | ICD-10-CM | POA: Diagnosis not present

## 2019-06-16 DIAGNOSIS — R7309 Other abnormal glucose: Secondary | ICD-10-CM | POA: Diagnosis not present

## 2019-06-17 ENCOUNTER — Encounter: Payer: Self-pay | Admitting: Physician Assistant

## 2019-06-17 ENCOUNTER — Telehealth: Payer: Self-pay

## 2019-06-17 LAB — IRON,TIBC AND FERRITIN PANEL
Ferritin: 131 ng/mL (ref 15–150)
Iron Saturation: 28 % (ref 15–55)
Iron: 102 ug/dL (ref 27–159)
Total Iron Binding Capacity: 361 ug/dL (ref 250–450)
UIBC: 259 ug/dL (ref 131–425)

## 2019-06-17 LAB — CBC WITH DIFFERENTIAL/PLATELET
Basophils Absolute: 0.1 10*3/uL (ref 0.0–0.2)
Basos: 1 %
EOS (ABSOLUTE): 0.4 10*3/uL (ref 0.0–0.4)
Eos: 6 %
Hematocrit: 44.2 % (ref 34.0–46.6)
Hemoglobin: 15.1 g/dL (ref 11.1–15.9)
Immature Grans (Abs): 0 10*3/uL (ref 0.0–0.1)
Immature Granulocytes: 0 %
Lymphocytes Absolute: 1.9 10*3/uL (ref 0.7–3.1)
Lymphs: 31 %
MCH: 30.1 pg (ref 26.6–33.0)
MCHC: 34.2 g/dL (ref 31.5–35.7)
MCV: 88 fL (ref 79–97)
Monocytes Absolute: 0.6 10*3/uL (ref 0.1–0.9)
Monocytes: 10 %
Neutrophils Absolute: 3.2 10*3/uL (ref 1.4–7.0)
Neutrophils: 52 %
Platelets: 223 10*3/uL (ref 150–450)
RBC: 5.01 x10E6/uL (ref 3.77–5.28)
RDW: 13.5 % (ref 11.7–15.4)
WBC: 6.1 10*3/uL (ref 3.4–10.8)

## 2019-06-17 LAB — COMPREHENSIVE METABOLIC PANEL
ALT: 18 IU/L (ref 0–32)
AST: 17 IU/L (ref 0–40)
Albumin/Globulin Ratio: 1.8 (ref 1.2–2.2)
Albumin: 4.6 g/dL (ref 3.8–4.9)
Alkaline Phosphatase: 106 IU/L (ref 39–117)
BUN/Creatinine Ratio: 24 — ABNORMAL HIGH (ref 9–23)
BUN: 20 mg/dL (ref 6–24)
Bilirubin Total: 0.4 mg/dL (ref 0.0–1.2)
CO2: 21 mmol/L (ref 20–29)
Calcium: 9.6 mg/dL (ref 8.7–10.2)
Chloride: 101 mmol/L (ref 96–106)
Creatinine, Ser: 0.82 mg/dL (ref 0.57–1.00)
GFR calc Af Amer: 94 mL/min/{1.73_m2} (ref >59)
GFR calc non Af Amer: 81 mL/min/{1.73_m2} (ref >59)
Globulin, Total: 2.6 g/dL (ref 1.5–4.5)
Glucose: 107 mg/dL — ABNORMAL HIGH (ref 65–99)
Potassium: 4.3 mmol/L (ref 3.5–5.2)
Sodium: 137 mmol/L (ref 134–144)
Total Protein: 7.2 g/dL (ref 6.0–8.5)

## 2019-06-17 LAB — T4 AND TSH
T4, Total: 7 ug/dL (ref 4.5–12.0)
TSH: 4.47 u[IU]/mL (ref 0.450–4.500)

## 2019-06-17 LAB — HEMOGLOBIN A1C
Est. average glucose Bld gHb Est-mCnc: 128 mg/dL
Hgb A1c MFr Bld: 6.1 % — ABNORMAL HIGH (ref 4.8–5.6)

## 2019-06-17 LAB — VITAMIN D 25 HYDROXY (VIT D DEFICIENCY, FRACTURES): Vit D, 25-Hydroxy: 40.1 ng/mL (ref 30.0–100.0)

## 2019-06-17 NOTE — Telephone Encounter (Signed)
-----   Message from Mar Daring, [place] sent at 06/17/2019  3:25 PM EDT ----- Blood count is normal. Kidney function is normal. Liver enzymes are normal. Sodium, potassium and calcium are normal.  Thyroid is normal. Iron levels are normal. Vit D is normal. A1c increased from 5.8 to 6.1.

## 2019-06-17 NOTE — Telephone Encounter (Signed)
Patient advised as directed below. 

## 2019-06-20 ENCOUNTER — Telehealth: Payer: Self-pay | Admitting: *Deleted

## 2019-06-20 NOTE — Telephone Encounter (Signed)
Ok to cnl apts for this week. MD would like to r/s her apts out for 3 months (lab/md/iron infusion). We will contact the patient.

## 2019-06-20 NOTE — Telephone Encounter (Signed)
Voice mail msg left for patient. 

## 2019-06-20 NOTE — Telephone Encounter (Signed)
Patient called inquiring if she needs to keep her appointment Wednesday since she had labs last week and they are all normal. Please return her call to let her know if this can be cancelled or if she needs to come in for appointment. [phone]

## 2019-06-22 ENCOUNTER — Inpatient Hospital Stay: Payer: Medicare Other

## 2019-06-22 ENCOUNTER — Inpatient Hospital Stay: Payer: Medicare Other | Admitting: Internal Medicine

## 2019-06-25 ENCOUNTER — Other Ambulatory Visit: Payer: Self-pay | Admitting: Physician Assistant

## 2019-06-25 DIAGNOSIS — G479 Sleep disorder, unspecified: Secondary | ICD-10-CM

## 2019-06-25 NOTE — Telephone Encounter (Signed)
Requested medication (s) are due for refill today: yes  Requested medication (s) are on the active medication list: yes  Last refill:  10/13/18  Future visit scheduled: no  Notes to clinic:  medication not delegated to NT to refill   Requested Prescriptions  Pending Prescriptions Disp Refills   temazepam (RESTORIL) 15 MG capsule [Pharmacy Med Name: TEMAZEPAM 15 MG CAP] 60 capsule     Sig: TAKE 1 TO 2 CAPSULES BY MOUTH AT [facility] NEEDED FOR SLEEP      Not Delegated - Psychiatry:  Anxiolytics/Hypnotics Failed - 06/25/2019 10:54 AM      Failed - This refill cannot be delegated      Failed - Urine Drug Screen completed in last 360 days.      Failed - Valid encounter within last 6 months    Recent Outpatient Visits           2 months ago [place], [place]   1 year ago Moderate persistent asthmatic bronchitis with acute exacerbation   [facility] [place] [employer], [place], [place]   1 year ago Annual physical exam   [facility] [place], [place], [place]   2 years ago [place], [place], [place]   2 years ago Acquired lymphedema   [facility] [place], [place]

## 2019-07-07 DIAGNOSIS — L659 Nonscarring hair loss, unspecified: Secondary | ICD-10-CM | POA: Diagnosis not present

## 2019-07-07 DIAGNOSIS — L650 Telogen effluvium: Secondary | ICD-10-CM | POA: Diagnosis not present

## 2019-07-08 ENCOUNTER — Other Ambulatory Visit: Payer: Self-pay | Admitting: Physician Assistant

## 2019-07-08 DIAGNOSIS — M5030 Other cervical disc degeneration, unspecified cervical region: Secondary | ICD-10-CM

## 2019-07-08 NOTE — Telephone Encounter (Signed)
Requested medication (s) are due for refill today: no  Requested medication (s) are on the active medication list: yes  Last refill:  03/03/2019  Future visit scheduled:no  Notes to clinic: this refill cannot be delegated    Requested Prescriptions  Pending Prescriptions Disp Refills   oxyCODONE-acetaminophen (PERCOCET/ROXICET) 5-325 MG tablet [Pharmacy Med Name: OXYCODONE-APAP 5-325 MG TAB] 30 tablet     Sig: TAKE 1 TO 2 TABLETS BY MOUTH EVERY 6 HOURS AS NEEDED FOR SEVERE PAIN      Not Delegated - Analgesics:  Opioid Agonist Combinations Failed - 07/08/2019  9:55 AM      Failed - This refill cannot be delegated      Failed - Urine Drug Screen completed in last 360 days.      Failed - Valid encounter within last 6 months    Recent Outpatient Visits           2 months ago [place], [place]   1 year ago Moderate persistent asthmatic bronchitis with acute exacerbation   [facility] [place], [place], [place]   1 year ago Annual physical exam   [facility] [place], [place], [place]   2 years ago [place], [place], [place]   2 years ago Acquired lymphedema   [facility] [place], [place]

## 2019-07-15 ENCOUNTER — Encounter: Payer: Self-pay | Admitting: Internal Medicine

## 2019-07-15 ENCOUNTER — Other Ambulatory Visit: Payer: Self-pay

## 2019-07-15 ENCOUNTER — Ambulatory Visit (INDEPENDENT_AMBULATORY_CARE_PROVIDER_SITE_OTHER): Payer: Medicare Other | Admitting: Internal Medicine

## 2019-07-15 VITALS — BP 118/72 | HR 86 | Temp 97.9°F | Ht 65.0 in | Wt 208.0 lb

## 2019-07-15 DIAGNOSIS — E65 Localized adiposity: Secondary | ICD-10-CM | POA: Insufficient documentation

## 2019-07-15 NOTE — Progress Notes (Signed)
Name: Sabrina Morris  MRN/ DOB: [id number], 1966/03/02    Age/ Sex: 54 y.o., female    PCP: Sabrina Morris   Reason for Endocrinology Evaluation: Adrenal Disorder      Date of Initial Endocrinology Evaluation: 07/15/2019     HPI: [name] is a 54 y.o. female with a past medical history of Hx of breast cancer (2012). The patient presented for initial endocrinology clinic visit on 07/15/2019 for consultative assistance with her Adrenal disorder.   She was diagnosed with right breast carcinoma in 2012 ( ER PR positive HER-2 negative) . She is S/P mastectomy, chemotherapy followed by external beam radiation. She was initially on Tamoxifen followed by Goserelin. Pt has noted abnormal weight gain and abnormal fat deposition since that time.   She went on estrogen and progesterone sometime in 2017 and initially noted some improvement in her symptoms for this only lasted 2 weeks.    The fat deposition has been noted around the neck area and abdomen.     In review of her records :  She has been evaluated by Dr. Ronnald Morris in [facility] for weight gain . She has a normal 24- hr urine cortisol level and appropriate response to dexamethasone suppression test.     She was again evaluated by Dr. Cristino Morris at [facility] in 2018 and her symptoms of fatigue, weight gain and non-restorative sleep was attributed to hormonally induced menopause and was started on a small dose of Estrace 0.5 mg daily and Prometrium 200 mg at bedtime despite the risk of breast cancer recurrence ( A letter was received from the oncologist Dr. Nicholas Morris about the knowledge of the risk and her acceptance of that risk)   She is S/p sx for paraesophageal surgery in 04/2019    HISTORY:  Past Medical History:  Past Medical History:  Diagnosis Date  . Adrenal disorder ([place])   . Anemia 11/2018  . Breast cancer ([place])   . H/O bone density study 2015  . H/O colonoscopy    sch 06/12/15  .  H/O colonoscopy 11/2018  . H/O endoscopy 11/2018   upper  . Lymphedema   . Lymphedema   . Migraine   . Pap smear for cervical cancer screening [id number]   Past Surgical History:  Past Surgical History:  Procedure Laterality Date  . breast  Left 02/18/2017   Implant removed  . BREAST ENHANCEMENT SURGERY Bilateral 2005  . CARDIAC CATHETERIZATION  1994  . CARDIAC CATHETERIZATION  1994  . MASTECTOMY Right 02/2011  . PORT A CATH INJECTION ([place] HX)  2013      Social History:  reports that she has never smoked. She has never used smokeless tobacco. She reports that she does not drink alcohol or use drugs.  Family History: family history includes Breast cancer (age of onset: 64) in her sister. She was adopted.   HOME MEDICATIONS: Allergies as of 07/15/2019      Reactions   Sumatriptan Other (See Comments), Shortness Of Breath   Feraheme [ferumoxytol] Nausea Only   Nausea fatigue 1 hour post infusion   Phentermine-topiramate Other (See Comments)   Sulfa Antibiotics Other (See Comments)   GI distress   Sulfamethoxazole Other (See Comments)   Tamoxifen Other (See Comments)   Edema, excess fluid   Zoladex  [goserelin]    Codeine Other (See Comments), Rash   Hyper, increased heart rate   Hydrocodone-acetaminophen Rash   Hyperactivity      Medication  List       Accurate as of July 15, 2019 10:07 AM. If you have any questions, ask your nurse or doctor.        butalbital-acetaminophen-caffeine 50-325-40 MG tablet Commonly known as: Bac TAKE 1 TO 2 TABLETS BY MOUTH EVERY 6 HOURS AS NEEDED FOR HEADACHE   calcium carbonate 100 mg/ml Susp Take by mouth.   chlorpheniramine-HYDROcodone 10-8 MG/5ML Suer Commonly known as: Tussionex Pennkinetic ER Take 5 mLs by mouth every 12 (twelve) hours as needed for cough.   cyclobenzaprine 5 MG tablet Commonly known as: FLEXERIL TAKE 1 TABLET BY MOUTH 3 TIMES A DAY AS NEEDED   estradiol 0.5 MG tablet Commonly known as: ESTRACE 0.25  mg.   fluocinonide 0.05 % external solution Commonly known as: LIDEX Apply 1 application topically 2 (two) times daily.   Fluticasone-Salmeterol 250-50 MCG/DOSE Aepb Commonly known as: ADVAIR Inhale 1 puff into the lungs 2 (two) times daily.   furosemide 20 MG tablet Commonly known as: LASIX TAKE 2 TABLETS BY MOUTH DAILY   ibuprofen 600 MG tablet Commonly known as: ADVIL TAKE 1 TABLET (600MG TOTAL) BY MOUTH EVERY 8 (EIGHT) HOURS AS NEEDED.   LORazepam 1 MG tablet Commonly known as: ATIVAN TAKE 1 TABLET BY MOUTH TWICE A DAY AS NEEDED   multivitamin capsule Take by mouth.   nystatin ointment Commonly known as: MYCOSTATIN Apply topically.   omeprazole 40 MG capsule Commonly known as: PRILOSEC   oxyCODONE-acetaminophen 5-325 MG tablet Commonly known as: PERCOCET/ROXICET TAKE 1 TO 2 TABLETS BY MOUTH EVERY 6 HOURS AS NEEDED FOR SEVERE PAIN   phentermine 37.5 MG tablet Commonly known as: ADIPEX-P Take by mouth.   potassium chloride 10 MEQ tablet Commonly known as: KLOR-CON TAKE 1 TABLET EVERY DAY   predniSONE 10 MG (21) Tbpk tablet Commonly known as: STERAPRED UNI-PAK 21 TAB 6 day taper; take as directed on package instructions   ProAir HFA 108 (90 Base) MCG/ACT inhaler Generic drug: albuterol Inhale 1 puff into the lungs every 6 (six) hours as needed for wheezing or shortness of breath.   progesterone 100 MG capsule Commonly known as: PROMETRIUM Take 125 mg by mouth.   promethazine 25 MG tablet Commonly known as: PHENERGAN Take 1 tablet (25 mg total) by mouth every 8 (eight) hours as needed for nausea or vomiting.   rizatriptan 10 MG tablet Commonly known as: MAXALT TAKE 1 TABLET BY MOUTH AT FIRST SIGN OF MIGRAINE SYMPTOMS. IF NO RELIEF, A SECOND TABLET MAY BE TAKEN IN 2 HOURS. LIMIT OF 2 TABS PER DAY.   Stool Softener 100 MG capsule Generic drug: docusate sodium Take by mouth.   temazepam 15 MG capsule Commonly known as: RESTORIL TAKE 1 TO 2 CAPSULES  BY MOUTH AT BEDTIMEAS NEEDED FOR SLEEP   valACYclovir 500 MG tablet Commonly known as: VALTREX Take 1 tablet (500 mg total) by mouth 2 (two) times daily. Prn fever blisters   Vitamin D3 125 MCG (5000 UT) Tabs Take by mouth.         REVIEW OF SYSTEMS: A comprehensive ROS was conducted with the patient and is negative except as per HPI and below:  Review of Systems  Gastrointestinal: Positive for nausea. Negative for constipation and diarrhea.  Musculoskeletal: Positive for joint pain.     OBJECTIVE:  VS: BP 118/72 (BP Location: Left Arm, Patient Position: Sitting, Cuff Size: Large)   Pulse 86   Temp 97.9 F (36.6 C)   Ht _0  (1.651 m)   Wt  208 lb (94.3 kg)   SpO2 98%   BMI 34.61 kg/m    Wt Readings from Last 3 Encounters:  07/15/19 208 lb (94.3 kg)  02/21/19 208 lb (94.3 kg)  02/16/19 211 lb (95.7 kg)     EXAM: General: Pt appears well and is in NAD  Eyes: External eye exam normal without stare, lid lag or exophthalmos.  EOM intact.    Neck: General: Supple without adenopathy. Thyroid: Thyroid size normal.  No goiter or nodules appreciated. No thyroid bruit.  Lungs: Clear with good BS bilat with no rales, rhonchi, or wheezes  Heart: Auscultation: RRR.  Abdomen: Normoactive bowel sounds, soft, nontender, without masses or organomegaly palpable  Extremities:  BL LE: No pretibial edema normal ROM and strength.  Skin: Hair: Texture and amount normal with gender appropriate distribution Skin Inspection: No rashes Skin Palpation: Skin temperature, texture, and thickness normal to palpation  Mental Status: Judgment, insight: Intact Orientation: Oriented to time, place, and person Mood and affect: No depression, anxiety, or agitation     DATA REVIEWED: Results for JESSIKAH, DICKER" (MRN [id number]) as of 07/15/2019 16:08  Ref. Range 06/16/2019 08:36  Sodium Latest Ref Range: 134 - 144 mmol/L 137  Potassium Latest Ref Range: 3.5 - 5.2 mmol/L 4.3   Chloride Latest Ref Range: 96 - 106 mmol/L 101  CO2 Latest Ref Range: 20 - 29 mmol/L 21  Glucose Latest Ref Range: 65 - 99 mg/dL 107 (H)  BUN Latest Ref Range: 6 - 24 mg/dL 20  Creatinine Latest Ref Range: 0.57 - 1.00 mg/dL 0.82  Calcium Latest Ref Range: 8.7 - 10.2 mg/dL 9.6  BUN/Creatinine Ratio Latest Ref Range: 9 - 23  24 (H)  Alkaline Phosphatase Latest Ref Range: 39 - 117 IU/L 106  Albumin Latest Ref Range: 3.8 - 4.9 g/dL 4.6  Albumin/Globulin Ratio Latest Ref Range: 1.2 - 2.2  1.8  AST Latest Ref Range: 0 - 40 IU/L 17  ALT Latest Ref Range: 0 - 32 IU/L 18  Total Protein Latest Ref Range: 6.0 - 8.5 g/dL 7.2  Total Bilirubin Latest Ref Range: 0.0 - 1.2 mg/dL 0.4  GFR, Est Non African American Latest Ref Range: >59 mL/min/1.73 81  GFR, Est African American Latest Ref Range: >59 mL/min/1.73 94  Iron Latest Ref Range: 27 - 159 ug/dL 102  UIBC Latest Ref Range: 131 - 425 ug/dL 259  TIBC Latest Ref Range: 250 - 450 ug/dL 361  Ferritin Latest Ref Range: 15 - 150 ng/mL 131  Iron Saturation Latest Ref Range: 15 - 55 % 28  Vitamin D, 25-Hydroxy Latest Ref Range: 30.0 - 100.0 ng/mL 40.1  Globulin, Total Latest Ref Range: 1.5 - 4.5 g/dL 2.6  WBC Latest Ref Range: 3.4 - 10.8 x10E3/uL 6.1  RBC Latest Ref Range: 3.77 - 5.28 x10E6/uL 5.01  Hemoglobin Latest Ref Range: 11.1 - 15.9 g/dL 15.1  HCT Latest Ref Range: 34.0 - 46.6 % 44.2  MCV Latest Ref Range: 79 - 97 fL 88  MCH Latest Ref Range: 26.6 - 33.0 pg 30.1  MCHC Latest Ref Range: 31.5 - 35.7 g/dL 34.2  RDW Latest Ref Range: 11.7 - 15.4 % 13.5  Platelets Latest Ref Range: 150 - 450 x10E3/uL 223  Neutrophils Latest Ref Range: Not Estab. % 52  Immature Granulocytes Latest Ref Range: Not Estab. % 0  NEUT# Latest Ref Range: 1.4 - 7.0 x10E3/uL 3.2  Lymphocyte # Latest Ref Range: 0.7 - 3.1 x10E3/uL 1.9  Monocytes Absolute Latest Ref  Range: 0.1 - 0.9 x10E3/uL 0.6  Basophils Absolute Latest Ref Range: 0.0 - 0.2 x10E3/uL 0.1  Immature Grans  (Abs) Latest Ref Range: 0.0 - 0.1 x10E3/uL 0.0  Lymphs Latest Ref Range: Not Estab. % 31  Monocytes Latest Ref Range: Not Estab. % 10  Basos Latest Ref Range: Not Estab. % 1  Eos Latest Ref Range: Not Estab. % 6  EOS (ABSOLUTE) Latest Ref Range: 0.0 - 0.4 x10E3/uL 0.4  Hemoglobin A1C Latest Ref Range: 4.8 - 5.6 % 6.1 (H)  Est. average glucose Bld gHb Est-mCnc Latest Units: mg/dL 128  TSH Latest Ref Range: 0.450 - 4.500 uIU/mL 4.470  Thyroxine (T4) Latest Ref Range: 4.5 - 12.0 ug/dL 7.0      ASSESSMENT/PLAN/RECOMMENDATIONS:   1. Abnormal Fat Deposition:   - Pt has been evaluated for cushing syndrome in the past with normal 24-hr urine collection as well as appropriate dexamethasone suppression test  - She is concerned about "adrenal fatigue" but I explained to her that the endocrine society does not believe this has any scientific evidence. We discussed adrenal insufficieny, but other than fatigue she does not have any other clinical features of it. I offered to proceed with cosyntropin stimulation test but she is reluctant as she is a "hard stick" - She also questions the functionality of her pituitary gland , we discussed in 2018 she had this tested and results were appropriate, and with no prior exposure of the  pituitary gland to radiation, there's no clinical  concern about further damage.  - As for her abnormal fat deposition, I have discussed lipodystrophy ( Pt does NOT have the phenotype though) we discussed  congenital and  Acquired forms. The commonest cause of acquired lipodystrophy is HIV treatment, but I am not aware if any of her chemo therapy medications had any role in this, since all this started after breast cancer treatment ?  - We discussed growth hormone deficiency could cause abnormal body composition , but the treatment would be very risky given her previous diagnosis of breast ca. - Reassurance provided at this time  F/U PRN     Signed electronically by: Mack Guise, MD  [facility]  [facility] [address] [place], [place] [address] Phone: [phone] FAX: [phone]   CC: Sabrina Morris [address] [place] [address] Phone: [phone] Fax: [phone]   Return to Endocrinology clinic as below: Future Appointments  Date Time Provider [place]  08/30/2019  9:00 AM Sabrina Lose, MD [facility] None  09/27/2019 12:45 PM [facility] None  09/27/2019  1:15 PM Cammie Sickle, MD [facility] None  09/27/2019  1:45 PM [facility] INFUSION CHAIR 8 [facility] None

## 2019-08-17 ENCOUNTER — Telehealth: Payer: Self-pay | Admitting: Hematology and Oncology

## 2019-08-17 NOTE — Telephone Encounter (Signed)
R/s  6/15 appt due to PAL unable to reach pt - left message with appt date and time

## 2019-08-22 ENCOUNTER — Encounter: Payer: Self-pay | Admitting: Physician Assistant

## 2019-08-23 ENCOUNTER — Encounter: Payer: Self-pay | Admitting: Physician Assistant

## 2019-08-23 ENCOUNTER — Encounter: Payer: Self-pay | Admitting: Hematology and Oncology

## 2019-08-25 NOTE — Telephone Encounter (Signed)
Written order faxed today

## 2019-08-30 ENCOUNTER — Ambulatory Visit: Payer: Medicare Other | Admitting: Hematology and Oncology

## 2019-08-30 ENCOUNTER — Other Ambulatory Visit: Payer: Self-pay | Admitting: Physician Assistant

## 2019-08-30 DIAGNOSIS — Z853 Personal history of malignant neoplasm of breast: Secondary | ICD-10-CM

## 2019-09-05 ENCOUNTER — Other Ambulatory Visit: Payer: Self-pay | Admitting: Physician Assistant

## 2019-09-05 DIAGNOSIS — R609 Edema, unspecified: Secondary | ICD-10-CM

## 2019-09-05 DIAGNOSIS — Z853 Personal history of malignant neoplasm of breast: Secondary | ICD-10-CM

## 2019-09-08 ENCOUNTER — Ambulatory Visit: Payer: Medicare Other | Admitting: Hematology and Oncology

## 2019-09-14 ENCOUNTER — Other Ambulatory Visit: Payer: Medicare Other

## 2019-09-20 ENCOUNTER — Ambulatory Visit: Payer: Medicare Other | Admitting: Occupational Therapy

## 2019-09-23 ENCOUNTER — Other Ambulatory Visit: Payer: Self-pay | Admitting: Physician Assistant

## 2019-09-23 DIAGNOSIS — G43109 Migraine with aura, not intractable, without status migrainosus: Secondary | ICD-10-CM

## 2019-09-23 NOTE — Telephone Encounter (Signed)
Left message for patient to return call to schedule office visit.

## 2019-09-23 NOTE — Telephone Encounter (Signed)
Courtesy refill given. Pt called and message left to return call to schedule appt.

## 2019-09-27 ENCOUNTER — Encounter: Payer: Self-pay | Admitting: *Deleted

## 2019-09-27 ENCOUNTER — Inpatient Hospital Stay (HOSPITAL_BASED_OUTPATIENT_CLINIC_OR_DEPARTMENT_OTHER): Payer: Medicare Other | Admitting: Internal Medicine

## 2019-09-27 ENCOUNTER — Encounter: Payer: Self-pay | Admitting: Internal Medicine

## 2019-09-27 ENCOUNTER — Other Ambulatory Visit: Payer: Self-pay

## 2019-09-27 ENCOUNTER — Inpatient Hospital Stay: Payer: Medicare Other | Attending: Internal Medicine

## 2019-09-27 ENCOUNTER — Inpatient Hospital Stay: Payer: Medicare Other

## 2019-09-27 VITALS — BP 139/88 | HR 84 | Temp 98.8°F | Resp 20 | Ht 65.0 in | Wt 207.4 lb

## 2019-09-27 DIAGNOSIS — I890 Lymphedema, not elsewhere classified: Secondary | ICD-10-CM | POA: Insufficient documentation

## 2019-09-27 DIAGNOSIS — Z882 Allergy status to sulfonamides status: Secondary | ICD-10-CM | POA: Insufficient documentation

## 2019-09-27 DIAGNOSIS — Z79899 Other long term (current) drug therapy: Secondary | ICD-10-CM | POA: Diagnosis not present

## 2019-09-27 DIAGNOSIS — Z9221 Personal history of antineoplastic chemotherapy: Secondary | ICD-10-CM | POA: Insufficient documentation

## 2019-09-27 DIAGNOSIS — Z9011 Acquired absence of right breast and nipple: Secondary | ICD-10-CM | POA: Insufficient documentation

## 2019-09-27 DIAGNOSIS — D500 Iron deficiency anemia secondary to blood loss (chronic): Secondary | ICD-10-CM | POA: Diagnosis not present

## 2019-09-27 DIAGNOSIS — D509 Iron deficiency anemia, unspecified: Secondary | ICD-10-CM | POA: Insufficient documentation

## 2019-09-27 DIAGNOSIS — K449 Diaphragmatic hernia without obstruction or gangrene: Secondary | ICD-10-CM | POA: Diagnosis not present

## 2019-09-27 DIAGNOSIS — Z803 Family history of malignant neoplasm of breast: Secondary | ICD-10-CM | POA: Insufficient documentation

## 2019-09-27 DIAGNOSIS — M549 Dorsalgia, unspecified: Secondary | ICD-10-CM | POA: Insufficient documentation

## 2019-09-27 DIAGNOSIS — Z853 Personal history of malignant neoplasm of breast: Secondary | ICD-10-CM | POA: Insufficient documentation

## 2019-09-27 DIAGNOSIS — Z923 Personal history of irradiation: Secondary | ICD-10-CM | POA: Insufficient documentation

## 2019-09-27 DIAGNOSIS — M2550 Pain in unspecified joint: Secondary | ICD-10-CM | POA: Insufficient documentation

## 2019-09-27 DIAGNOSIS — D751 Secondary polycythemia: Secondary | ICD-10-CM | POA: Insufficient documentation

## 2019-09-27 DIAGNOSIS — Z885 Allergy status to narcotic agent status: Secondary | ICD-10-CM | POA: Insufficient documentation

## 2019-09-27 LAB — IRON AND TIBC
Iron: 48 ug/dL (ref 28–170)
Saturation Ratios: 12 % (ref 10.4–31.8)
TIBC: 406 ug/dL (ref 250–450)
UIBC: 358 ug/dL

## 2019-09-27 LAB — CBC WITH DIFFERENTIAL/PLATELET
Abs Immature Granulocytes: 0.01 10*3/uL (ref 0.00–0.07)
Basophils Absolute: 0.1 10*3/uL (ref 0.0–0.1)
Basophils Relative: 1 %
Eosinophils Absolute: 0.2 10*3/uL (ref 0.0–0.5)
Eosinophils Relative: 2 %
HCT: 48.4 % — ABNORMAL HIGH (ref 36.0–46.0)
Hemoglobin: 16.9 g/dL — ABNORMAL HIGH (ref 12.0–15.0)
Immature Granulocytes: 0 %
Lymphocytes Relative: 19 %
Lymphs Abs: 1.7 10*3/uL (ref 0.7–4.0)
MCH: 30.1 pg (ref 26.0–34.0)
MCHC: 34.9 g/dL (ref 30.0–36.0)
MCV: 86.1 fL (ref 80.0–100.0)
Monocytes Absolute: 0.6 10*3/uL (ref 0.1–1.0)
Monocytes Relative: 7 %
Neutro Abs: 6.5 10*3/uL (ref 1.7–7.7)
Neutrophils Relative %: 71 %
Platelets: 237 10*3/uL (ref 150–400)
RBC: 5.62 MIL/uL — ABNORMAL HIGH (ref 3.87–5.11)
RDW: 12.9 % (ref 11.5–15.5)
WBC: 9 10*3/uL (ref 4.0–10.5)
nRBC: 0 % (ref 0.0–0.2)

## 2019-09-27 LAB — FERRITIN: Ferritin: 42 ng/mL (ref 11–307)

## 2019-09-27 NOTE — Assessment & Plan Note (Addendum)
#   History Severe iron deficient anemia-question blood loss appears chronic MCV- resolved s/p since fundoplication. Today hemoglobin is  16.9; see below.  Patient symptoms improved.  # Erythrocytosis new? Diuretics vs. sleep apnea versus primary bone marrow process [clinically less likely]; will check JAK2 mutation at next visit  # Hiatal hernia-s/p Nissen fundoplication [[facility]]-stable  # Lymphedema chest wall-stable sec to breast surgery/? Surgery- on PT.   # History of breast cancer-stable on surveillance.  Stable clinically no evidence of recurrence. [followed by Dr.Gudena]; Mammogram- plan this week.  # DISPOSITION: # HOLD IV Iron today # follow up in 3 months- MD/labs-cbc/jak-2 ordered- Dr.B  Cc; Dr.Gudena

## 2019-09-27 NOTE — Progress Notes (Signed)
[place] NOTE  Patient Care Team: Mar Daring, PA-C as PCP - General (Family Medicine) Ellwood Handler., MD (Internal Medicine)  CHIEF COMPLAINTS/PURPOSE OF CONSULTATION: Anemia   HEMATOLOGY HISTORY  # Iron deficiency anemia -AUG 3rd 2020-hemoglobin 7.4 MCV 67; ferritin 3/iron saturation 3% [ EGD/colonoscopy- 2017 [screening;[facility]; Dr.Buccini; [place];capsule-none]. SEP 2020- CT abdomen pelvis-large hiatal hernia.  Feraheme-intolerance; OCT 2020-[facility]- [facility] [scopes/capsule]-  #Hiatal hernia status post surgery April 2021 [facility]; anemia resolved  # July 2021-erythrocytosis hemoglobin 16  #Right breast cancer ER PR positive HER-2 negative; T3N1 at 45 right s/p mastectomy N-1/20 LN- ALND s/p RT; TAC x 6 cycles [Dr.Marcom/Dr.Gudena]  # Lymphedema [Dr.Malone]  Oncology History  History of breast cancer  01/01/2011 Initial Diagnosis   Patient presented with palpable lump in the right breast a dumbbell-shaped tumor was identified spanning 7 cm   02/24/2011 Surgery   Right mastectomy revealed a dumbbell-shaped mass 3.5 cm and 2.5 cm with a total span of 7 cm 1/20 lymph nodes were positive ER/PR positive HER-2 negative   05/07/2011 - 09/04/2011 Chemotherapy   Adjuvant chemotherapy with Taxol, Adriamycin, Cytoxan every 3 weeks 6 cycles at [facility] ([email or web address])   09/19/2011 - 10/29/2011 Radiation Therapy   Adjuvant radiation therapy 30 fractions including right axilla   12/01/2011 - 04/01/2013 Anti-estrogen oral therapy   Adjuvant tamoxifen was started reluctantly, Zoladex was added January 2014 and antiestrogen therapy was discontinued January 2015 (profound lymphedema and anasarca )      HISTORY OF PRESENTING ILLNESS:  Sabrina Morris 54 y.o.  female with prior history of above breast cancer; and Iron deficiency anemia-is here for follow-up.  In the interim patient underwent surgery for her hiatal hernia recovering fairly well.  She also  continues to get physical therapy for lymphedema of the chest wall.  Review of Systems  Constitutional: Positive for malaise/fatigue. Negative for chills, diaphoresis and fever.  HENT: Negative for nosebleeds and sore throat.   Eyes: Negative for double vision.  Respiratory: Negative for cough, hemoptysis, sputum production and wheezing.   Cardiovascular: Negative for chest pain, palpitations, orthopnea and leg swelling.  Gastrointestinal: Negative for abdominal pain, blood in stool, constipation, diarrhea, heartburn, melena, nausea and vomiting.  Genitourinary: Negative for dysuria, frequency and urgency.  Musculoskeletal: Positive for back pain and joint pain.  Skin: Negative.  Negative for itching and rash.  Neurological: Negative for dizziness, tingling, focal weakness, weakness and headaches.  Endo/Heme/Allergies: Does not bruise/bleed easily.  Psychiatric/Behavioral: Negative for depression. The patient is not nervous/anxious and does not have insomnia.     MEDICAL HISTORY:  Past Medical History:  Diagnosis Date  . Adrenal disorder ([place])   . Anemia 11/2018  . Breast cancer ([place])   . H/O bone density study 2015  . H/O colonoscopy    sch 06/12/15  . H/O colonoscopy 11/2018  . H/O endoscopy 11/2018   upper  . Lymphedema   . Lymphedema   . Migraine   . Pap smear for cervical cancer screening [id number]    SURGICAL HISTORY: Past Surgical History:  Procedure Laterality Date  . breast  Left 02/18/2017   Implant removed  . BREAST ENHANCEMENT SURGERY Bilateral 2005  . CARDIAC CATHETERIZATION  1994  . CARDIAC CATHETERIZATION  1994  . MASTECTOMY Right 02/2011  . PORT A CATH INJECTION ([place] HX)  2013    SOCIAL HISTORY: Social History   Socioeconomic History  . Marital status: Single    Spouse name: Not on file  .  Number of children: Not on file  . Years of education: Not on file  . Highest education level: Not on file  Occupational History  . Occupation: Facilities manager: [employer]    Comment: Full Time  Tobacco Use  . Smoking status: Never Smoker  . Smokeless tobacco: Never Used  Vaping Use  . Vaping Use: Never used  Substance and Sexual Activity  . Alcohol use: No  . Drug use: No  . Sexual activity: Not Currently  Other Topics Concern  . 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  No smoking or alcohol. Lives at home/with sister.    Social Determinants of Health   Financial Resource Strain:   . Difficulty of Paying Living Expenses:   Food Insecurity:   . Worried About Charity fundraiser in the Last Year:   . Arboriculturist in the Last Year:   Transportation Needs:   . Film/video editor (Medical):   Marland Kitchen Lack of Transportation (Non-Medical):   Physical Activity:   . Days of Exercise per Week:   . Minutes of Exercise per Session:   Stress:   . Feeling of Stress :   Social Connections:   . Frequency of Communication with Friends and Family:   . Frequency of Social Gatherings with Friends and Family:   . Attends Religious Services:   . Active Member of Clubs or Organizations:   . Attends Archivist Meetings:   Marland Kitchen Marital Status:   Intimate Partner Violence:   . Fear of Current or Ex-Partner:   . Emotionally Abused:   Marland Kitchen Physically Abused:   . Sexually Abused:     FAMILY HISTORY: Family History  Adopted: Yes  Problem Relation Age of Onset  . Breast cancer Sister 87       1/2 sister; Genetic testing neg at [facility]    ALLERGIES:  is allergic to sumatriptan, feraheme [ferumoxytol], phentermine-topiramate, sulfa antibiotics, sulfamethoxazole, tamoxifen, zoladex  [goserelin], codeine, and hydrocodone-acetaminophen.  MEDICATIONS:  Current Outpatient Medications  Medication Sig Dispense Refill  . butalbital-acetaminophen-caffeine (BAC) 50-325-40 MG tablet TAKE 1 TO 2 TABLETS BY MOUTH EVERY 6 HOURS AS NEEDED FOR HEADACHE  60 tablet 5  . calcium carbonate 100 mg/ml SUSP Take by mouth.    . Cholecalciferol (VITAMIN D3) 5000 units TABS Take by mouth.    . cyclobenzaprine (FLEXERIL) 5 MG tablet TAKE 1 TABLET BY MOUTH 3 TIMES A DAY AS NEEDED 90 tablet 1  . docusate sodium (STOOL SOFTENER) 100 MG capsule Take by mouth.    . estradiol (ESTRACE) 0.5 MG tablet 0.25 mg.   2  . fluocinonide (LIDEX) 0.05 % external solution Apply 1 application topically 2 (two) times daily. 60 mL 3  . furosemide (LASIX) 20 MG tablet TAKE 2 TABLETS BY MOUTH DAILY 60 tablet 5  . ibuprofen (ADVIL) 600 MG tablet TAKE 1 TABLET (600MG TOTAL) BY MOUTH EVERY 8 (EIGHT) HOURS AS NEEDED. 360 tablet 1  . LORazepam (ATIVAN) 1 MG tablet TAKE 1 TABLET BY MOUTH TWICE A DAY AS NEEDED 60 tablet 5  . Multiple Vitamin (MULTIVITAMIN) capsule Take by mouth.    . nystatin ointment (MYCOSTATIN) Apply topically.    Marland Kitchen oxyCODONE-acetaminophen (PERCOCET/ROXICET) 5-325 MG tablet TAKE 1 TO 2 TABLETS BY MOUTH EVERY 6 HOURS AS NEEDED FOR SEVERE PAIN 30 tablet 0  . phentermine (ADIPEX-P) 37.5 MG  tablet Take by mouth.    . potassium chloride (K-DUR,KLOR-CON) 10 MEQ tablet TAKE 1 TABLET EVERY DAY 90 tablet 1  . progesterone (PROMETRIUM) 100 MG capsule Take 125 mg by mouth.     . promethazine (PHENERGAN) 25 MG tablet Take 1 tablet (25 mg total) by mouth every 8 (eight) hours as needed for nausea or vomiting. 20 tablet 3  . rizatriptan (MAXALT) 10 MG tablet TAKE 1 TABLET BY MOUTH AT FIRST SIGN OF MIGRAINE SYMPTOMS. IF NO RELIEF, A SECOND TABLET MAY BE TAKEN IN 2 HOURS. LIMIT OF 2 TABLETS PER DAY. 15 tablet 0  . temazepam (RESTORIL) 15 MG capsule TAKE 1 TO 2 CAPSULES BY MOUTH AT BEDTIMEAS NEEDED FOR SLEEP 60 capsule 5  . valACYclovir (VALTREX) 500 MG tablet Take 1 tablet (500 mg total) by mouth 2 (two) times daily. Prn fever blisters 30 tablet 5   No current facility-administered medications for this visit.      PHYSICAL EXAMINATION:   Vitals:   09/27/19 1315  BP:  139/88  Pulse: 84  Resp: 20  Temp: 98.8 F (37.1 C)   Filed Weights   09/27/19 1320  Weight: 207 lb 6.4 oz (94.1 kg)    Physical Exam HENT:     Head: Normocephalic and atraumatic.     Mouth/Throat:     Pharynx: No oropharyngeal exudate.  Eyes:     Pupils: Pupils are equal, round, and reactive to light.  Cardiovascular:     Rate and Rhythm: Normal rate and regular rhythm.  Pulmonary:     Effort: Pulmonary effort is normal. No respiratory distress.     Breath sounds: Normal breath sounds. No wheezing.  Abdominal:     General: Bowel sounds are normal. There is no distension.     Palpations: Abdomen is soft. There is no mass.     Tenderness: There is no abdominal tenderness. There is no guarding or rebound.  Musculoskeletal:        General: No tenderness. Normal range of motion.     Cervical back: Normal range of motion and neck supple.  Skin:    General: Skin is warm.  Neurological:     Mental Status: She is alert and oriented to person, place, and time.  Psychiatric:        Mood and Affect: Affect normal.     LABORATORY DATA:  I have reviewed the data as listed Lab Results  Component Value Date   WBC 9.0 09/27/2019   HGB 16.9 (H) 09/27/2019   HCT 48.4 (H) 09/27/2019   MCV 86.1 09/27/2019   PLT 237 09/27/2019   Recent Labs    10/18/18 1131 10/18/18 1131 11/25/18 0958 02/16/19 1254 06/16/19 0836  NA 142  --  138 138 137  K 4.5   < > 4.0 3.8 4.3  CL 106   < > 104 104 101  CO2 22   < > _0 GLUCOSE 107*  --  114* 113* 107*  BUN 12  --  23* 18 20  CREATININE 0.92   < > 0.84 0.76 0.82  CALCIUM 8.8   < > 9.3 9.7 9.6  GFRNONAA 71   < > >60 >60 81  GFRAA 82   < > >60 >60 94  PROT 6.4  --   --   --  7.2  ALBUMIN 4.3  --   --   --  4.6  AST 16  --   --   --  17  ALT 12  --   --   --  18  ALKPHOS 87  --   --   --  106  BILITOT <0.2  --   --   --  0.4   < > = values in this interval not displayed.     No results found.  Iron deficiency anemia due to  chronic blood loss # History Severe iron deficient anemia-question blood loss appears chronic MCV- resolved s/p since fundoplication. Today hemoglobin is  16.9; see below.  Patient symptoms improved.  # Erythrocytosis new? Diuretics vs. sleep apnea versus primary bone marrow process [clinically less likely]; will check JAK2 mutation at next visit  # Hiatal hernia-s/p Nissen fundoplication [[facility]]-stable  # Lymphedema chest wall-stable sec to breast surgery/? Surgery- on PT.   # History of breast cancer-stable on surveillance.  Stable clinically no evidence of recurrence. [followed by Dr.Gudena]; Mammogram- plan this week.  # DISPOSITION: # HOLD IV Iron today # follow up in 3 months- MD/labs-cbc/jak-2 ordered- Dr.B  Cc; Dr.Gudena   All questions were answered. The patient knows to call the clinic with any problems, questions or concerns.    Cammie Sickle, MD 09/27/2019 9:26 PM

## 2019-09-28 ENCOUNTER — Encounter: Payer: Self-pay | Admitting: Internal Medicine

## 2019-09-30 ENCOUNTER — Ambulatory Visit
Admission: RE | Admit: 2019-09-30 | Discharge: 2019-09-30 | Disposition: A | Discharge status: Home / Self Care | Payer: Medicare Other | Source: Ambulatory Visit | Attending: Physician Assistant | Admitting: Physician Assistant

## 2019-09-30 ENCOUNTER — Encounter: Payer: Self-pay | Admitting: Internal Medicine

## 2019-09-30 ENCOUNTER — Other Ambulatory Visit: Payer: Self-pay

## 2019-09-30 DIAGNOSIS — Z853 Personal history of malignant neoplasm of breast: Secondary | ICD-10-CM

## 2019-09-30 DIAGNOSIS — N6489 Other specified disorders of breast: Secondary | ICD-10-CM | POA: Diagnosis not present

## 2019-09-30 DIAGNOSIS — R928 Other abnormal and inconclusive findings on diagnostic imaging of breast: Secondary | ICD-10-CM | POA: Diagnosis not present

## 2019-09-30 DIAGNOSIS — R609 Edema, unspecified: Secondary | ICD-10-CM

## 2019-10-03 ENCOUNTER — Ambulatory Visit (INDEPENDENT_AMBULATORY_CARE_PROVIDER_SITE_OTHER): Payer: Medicare Other | Admitting: Physician Assistant

## 2019-10-03 ENCOUNTER — Other Ambulatory Visit: Payer: Self-pay

## 2019-10-03 ENCOUNTER — Other Ambulatory Visit: Payer: Self-pay | Admitting: Physician Assistant

## 2019-10-03 ENCOUNTER — Encounter: Payer: Self-pay | Admitting: Physician Assistant

## 2019-10-03 VITALS — BP 119/77 | HR 81 | Temp 98.3°F | Resp 16 | Ht 65.0 in | Wt 209.0 lb

## 2019-10-03 DIAGNOSIS — R0683 Snoring: Secondary | ICD-10-CM | POA: Diagnosis not present

## 2019-10-03 DIAGNOSIS — L409 Psoriasis, unspecified: Secondary | ICD-10-CM

## 2019-10-03 DIAGNOSIS — C50111 Malignant neoplasm of central portion of right female breast: Secondary | ICD-10-CM | POA: Diagnosis not present

## 2019-10-03 DIAGNOSIS — G43809 Other migraine, not intractable, without status migrainosus: Secondary | ICD-10-CM | POA: Diagnosis not present

## 2019-10-03 DIAGNOSIS — R519 Headache, unspecified: Secondary | ICD-10-CM

## 2019-10-03 DIAGNOSIS — Z4431 Encounter for fitting and adjustment of external right breast prosthesis: Secondary | ICD-10-CM | POA: Diagnosis not present

## 2019-10-03 DIAGNOSIS — M5030 Other cervical disc degeneration, unspecified cervical region: Secondary | ICD-10-CM

## 2019-10-03 DIAGNOSIS — Z6834 Body mass index (BMI) 34.0-34.9, adult: Secondary | ICD-10-CM

## 2019-10-03 DIAGNOSIS — R5382 Chronic fatigue, unspecified: Secondary | ICD-10-CM | POA: Diagnosis not present

## 2019-10-03 DIAGNOSIS — E6609 Other obesity due to excess calories: Secondary | ICD-10-CM

## 2019-10-03 MED ORDER — OXYCODONE-ACETAMINOPHEN 5-325 MG PO TABS: ORAL_TABLET | ORAL | 0 refills | Status: DC

## 2019-10-03 MED ORDER — FLUOCINONIDE 0.05 % EX SOLN: 1.0000 "application " | Freq: Two times a day (BID) | CUTANEOUS | 3 refills | Status: AC

## 2019-10-03 MED ORDER — RIZATRIPTAN BENZOATE 10 MG PO TABS: ORAL_TABLET | ORAL | 11 refills | Status: DC

## 2019-10-03 MED ORDER — BUTALBITAL-APAP-CAFFEINE 50-325-40 MG PO TABS: ORAL_TABLET | ORAL | 5 refills | Status: DC

## 2019-10-03 NOTE — Progress Notes (Signed)
Established patient visit   Patient: Sabrina Morris   DOB: 27-Jun-1965   54 y.o. Female  MRN: [id number] Visit Date: 10/03/2019  Today's healthcare provider: Mar Daring, PA-C   No chief complaint on file.  Subjective    HPI  Patient here today with c/o needing refills on Maxalt. She reports that her migraines overall is fairly well.  She also would like to talk about having a sleep study. Reports that she sleeps about 2-3 hours total. She does snore. Wakes up with a daily headache. Does have fatigue, but this has been long-standing since her inflammatory breast cancer.   Patient Active Problem List   Diagnosis Date Noted  . Fat deposits 07/15/2019  . Iron deficiency anemia due to chronic blood loss 10/26/2018  . Adrenal disorder ([place]) 05/05/2017  . Postmenopausal 07/02/2015  . Lymphedema of face 06/27/2015  . Hypokalemia 11/30/2014  . Accumulation of fluid in tissues 10/03/2014  . Hypercholesteremia 10/03/2014  . Acquired lymphedema 10/03/2014  . Menopausal and perimenopausal disorder 10/03/2014  . Borderline diabetes 10/03/2014  . Abnormal weight gain 10/03/2014  . Ventricular pre-excitation with arrhythmia 10/03/2014  . GERD (gastroesophageal reflux disease) 09/22/2014  . Anxiety 09/11/2014  . 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 Lymphedema of leg 01/04/2014  . History of asthma 10/29/2011  . Menopause praecox 09/10/2011  . Post-lymphadenectomy lymphedema of arm 09/10/2011  . History of breast cancer 01/01/2011  . Headache, migraine 06/18/2006   Past Medical History:  Diagnosis Date  . Adrenal disorder ([place])   . Anemia 11/2018  . Breast cancer ([place])   . H/O bone density study 2015  . H/O colonoscopy    sch 06/12/15  . H/O colonoscopy 11/2018  . H/O endoscopy 11/2018   upper  . Lymphedema   . Lymphedema   . Migraine   . Pap smear for cervical cancer screening [id number]       Medications: Outpatient Medications Prior to  Visit  Medication Sig  . butalbital-acetaminophen-caffeine (BAC) 50-325-40 MG tablet TAKE 1 TO 2 TABLETS BY MOUTH EVERY 6 HOURS AS NEEDED FOR HEADACHE  . calcium carbonate 100 mg/ml SUSP Take by mouth.  . Cholecalciferol (VITAMIN D3) 5000 units TABS Take by mouth.  . cyclobenzaprine (FLEXERIL) 5 MG tablet TAKE 1 TABLET BY MOUTH 3 TIMES A DAY AS NEEDED  . docusate sodium (STOOL SOFTENER) 100 MG capsule Take by mouth.  . estradiol (ESTRACE) 0.5 MG tablet 0.25 mg.   . fluocinonide (LIDEX) 0.05 % external solution Apply 1 application topically 2 (two) times daily.  . furosemide (LASIX) 20 MG tablet TAKE 2 TABLETS BY MOUTH DAILY  . ibuprofen (ADVIL) 600 MG tablet TAKE 1 TABLET (600MG  TOTAL) BY MOUTH EVERY 8 (EIGHT) HOURS AS NEEDED.  Marland Kitchen LORazepam (ATIVAN) 1 MG tablet TAKE 1 TABLET BY MOUTH TWICE A DAY AS NEEDED  . Multiple Vitamin (MULTIVITAMIN) capsule Take by mouth.  . nystatin ointment (MYCOSTATIN) Apply topically.  Marland Kitchen oxyCODONE-acetaminophen (PERCOCET/ROXICET) 5-325 MG tablet TAKE 1 TO 2 TABLETS BY MOUTH EVERY 6 HOURS AS NEEDED FOR SEVERE PAIN  . phentermine (ADIPEX-P) 37.5 MG tablet Take by mouth.  . potassium chloride (K-DUR,KLOR-CON) 10 MEQ tablet TAKE 1 TABLET EVERY DAY  . progesterone (PROMETRIUM) 100 MG capsule Take 125 mg by mouth.   . promethazine (PHENERGAN) 25 MG tablet Take 1 tablet (25 mg total) by mouth every 8 (eight) hours as needed for nausea or vomiting.  . rizatriptan (MAXALT) 10 MG tablet TAKE 1 TABLET  BY MOUTH AT FIRST SIGN OF MIGRAINE SYMPTOMS. IF NO RELIEF, A SECOND TABLET MAY BE TAKEN IN 2 HOURS. LIMIT OF 2 TABLETS PER DAY.  Marland Kitchen temazepam (RESTORIL) 15 MG capsule TAKE 1 TO 2 CAPSULES BY MOUTH AT BEDTIMEAS NEEDED FOR SLEEP  . valACYclovir (VALTREX) 500 MG tablet Take 1 tablet (500 mg total) by mouth 2 (two) times daily. Prn fever blisters  . TESTOSTERONE BU Place 0.025 mg inside cheek daily.  (Patient not taking: Reported on 10/03/2019)   No facility-administered medications  prior to visit.    Review of Systems  Constitutional: Positive for fatigue.  Cardiovascular: Negative for chest pain and palpitations.  Neurological: Positive for headaches.  Psychiatric/Behavioral: Positive for sleep disturbance.    Last CBC Lab Results  Component Value Date   WBC 9.0 09/27/2019   HGB 16.9 (H) 09/27/2019   HCT 48.4 (H) 09/27/2019   MCV 86.1 09/27/2019   MCH 30.1 09/27/2019   RDW 12.9 09/27/2019   PLT 237 93/71/6967   Last metabolic panel Lab Results  Component Value Date   GLUCOSE 107 (H) 06/16/2019   NA 137 06/16/2019   K 4.3 06/16/2019   CL 101 06/16/2019   CO2 21 06/16/2019   BUN 20 06/16/2019   CREATININE 0.82 06/16/2019   GFRNONAA 81 06/16/2019   GFRAA 94 06/16/2019   CALCIUM 9.6 06/16/2019   PROT 7.2 06/16/2019   ALBUMIN 4.6 06/16/2019   LABGLOB 2.6 06/16/2019   AGRATIO 1.8 06/16/2019   BILITOT 0.4 06/16/2019   ALKPHOS 106 06/16/2019   AST 17 06/16/2019   ALT 18 06/16/2019   ANIONGAP 12 02/16/2019      Objective    BP 119/77 (BP Location: Left Arm, Patient Position: Sitting, Cuff Size: Large)   Pulse 81   Temp 98.3 F (36.8 C) (Temporal)   Resp 16   Ht 5\' 5"  (1.651 m)   Wt 209 lb (94.8 kg)   BMI 34.78 kg/m  BP Readings from Last 3 Encounters:  10/03/19 119/77  09/27/19 139/88  07/15/19 118/72   Wt Readings from Last 3 Encounters:  10/03/19 209 lb (94.8 kg)  09/27/19 207 lb 6.4 oz (94.1 kg)  07/15/19 208 lb (94.3 kg)      Physical Exam Vitals reviewed.  Constitutional:      General: She is not in acute distress.    Appearance: Normal appearance. She is well-developed. She is obese. She is not ill-appearing or diaphoretic.  Cardiovascular:     Rate and Rhythm: Normal rate and regular rhythm.     Pulses: Normal pulses.     Heart sounds: Normal heart sounds. No murmur heard.  No friction rub. No gallop.   Pulmonary:     Effort: Pulmonary effort is normal. No respiratory distress.     Breath sounds: Normal breath  sounds. No wheezing or rales.  Musculoskeletal:     Cervical back: Normal range of motion and neck supple.  Neurological:     Mental Status: She is alert.       No results found for any visits on 10/03/19.  Assessment & Plan     1. Other migraine without status migrainosus, not intractable Stable. Diagnosis pulled for medication refill. Continue current medical treatment plan. Will check labs as below and f/u pending results. - butalbital-acetaminophen-caffeine (BAC) 50-325-40 MG tablet; TAKE 1 TO 2 TABLETS BY MOUTH EVERY 6 HOURS AS NEEDED FOR HEADACHE  Dispense: 60 tablet; Refill: 5 - Home sleep test - Comprehensive Metabolic Panel (CMET) - rizatriptan (  MAXALT) 10 MG tablet; TAKE 1 TABLET BY MOUTH AT FIRST SIGN OF MIGRAINE SYMPTOMS. IF NO RELIEF, A SECOND TABLET MAY BE TAKEN IN 2 HOURS. LIMIT OF 2 TABLETS PER DAY.  Dispense: 30 tablet; Refill: 11  2. Snores Home sleep study ordered due to symptoms of sleep apnea. Will f/u pending results.  - Home sleep test - Comprehensive Metabolic Panel (CMET)  3. Chronic fatigue See above medical treatment plan. - Home sleep test - Comprehensive Metabolic Panel (CMET)  4. Morning headache See above medical treatment plan. - Home sleep test - Comprehensive Metabolic Panel (CMET)  5. Class 1 obesity due to excess calories with serious comorbidity and body mass index (BMI) of 34.0 to 34.9 in adult Counseled patient on healthy lifestyle modifications including dieting and exercise.  - Home sleep test - Comprehensive Metabolic Panel (CMET)  6. Psoriasis Stable. Diagnosis pulled for medication refill. Continue current medical treatment plan. - fluocinonide (LIDEX) 0.05 % external solution; Apply 1 application topically 2 (two) times daily.  Dispense: 60 mL; Refill: 3  7. DDD (degenerative disc disease), cervical Stable. Diagnosis pulled for medication refill. Continue current medical treatment plan. - oxyCODONE-acetaminophen  (PERCOCET/ROXICET) 5-325 MG tablet; TAKE 1 TO 2 TABLETS BY MOUTH EVERY 6 HOURS AS NEEDED FOR SEVERE PAIN  Dispense: 30 tablet; Refill: 0   No follow-ups on file.      Reynolds Bowl, PA-C, have reviewed all documentation for this visit. The documentation on 10/05/19 for the exam, diagnosis, procedures, and orders are all accurate and complete.   Rubye Beach  [facility] [phone] (phone) [phone] (fax)  [place]

## 2019-10-04 ENCOUNTER — Telehealth: Payer: Self-pay

## 2019-10-04 LAB — COMPREHENSIVE METABOLIC PANEL
ALT: 29 IU/L (ref 0–32)
AST: 18 IU/L (ref 0–40)
Albumin/Globulin Ratio: 1.9 (ref 1.2–2.2)
Albumin: 4.7 g/dL (ref 3.8–4.9)
Alkaline Phosphatase: 109 IU/L (ref 48–121)
BUN/Creatinine Ratio: 16 (ref 9–23)
BUN: 15 mg/dL (ref 6–24)
Bilirubin Total: 0.4 mg/dL (ref 0.0–1.2)
CO2: 20 mmol/L (ref 20–29)
Calcium: 9.3 mg/dL (ref 8.7–10.2)
Chloride: 102 mmol/L (ref 96–106)
Creatinine, Ser: 0.92 mg/dL (ref 0.57–1.00)
GFR calc Af Amer: 82 mL/min/{1.73_m2} (ref >59)
GFR calc non Af Amer: 71 mL/min/{1.73_m2} (ref >59)
Globulin, Total: 2.5 g/dL (ref 1.5–4.5)
Glucose: 97 mg/dL (ref 65–99)
Potassium: 4.2 mmol/L (ref 3.5–5.2)
Sodium: 138 mmol/L (ref 134–144)
Total Protein: 7.2 g/dL (ref 6.0–8.5)

## 2019-10-04 NOTE — Telephone Encounter (Signed)
Kidney and liver function are normal. Sugar is normal. Sodium, potassium, and calcium are normal.  Written by Mar Daring, PA-C on 10/04/2019 11:27 AM EDT Seen by patient Sabrina Morris on 10/04/2019 11:28 AM

## 2019-10-04 NOTE — Telephone Encounter (Signed)
-----   Message from Mar Daring, [place] sent at 10/04/2019 11:27 AM EDT ----- Kidney and liver function are normal. Sugar is normal. Sodium, potassium, and calcium are normal.

## 2019-10-05 ENCOUNTER — Encounter: Payer: Self-pay | Admitting: Physician Assistant

## 2019-10-10 ENCOUNTER — Encounter: Payer: Self-pay | Admitting: Internal Medicine

## 2019-10-11 ENCOUNTER — Ambulatory Visit: Payer: Medicare Other | Attending: Physician Assistant | Admitting: Occupational Therapy

## 2019-10-11 ENCOUNTER — Other Ambulatory Visit: Payer: Self-pay

## 2019-10-11 ENCOUNTER — Telehealth: Payer: Self-pay

## 2019-10-11 ENCOUNTER — Encounter: Payer: Self-pay | Admitting: Occupational Therapy

## 2019-10-11 DIAGNOSIS — I972 Postmastectomy lymphedema syndrome: Secondary | ICD-10-CM | POA: Diagnosis not present

## 2019-10-11 NOTE — Telephone Encounter (Signed)
Copied from [place] [phone]. Topic: General - Other >> Oct 11, 2019 11:28 AM Leward Quan A wrote: Reason for CRM: Patient called to speak to Judson Roch in reference to a referral for a sleep study she is asking for an update ASAP please can be reached at Ph# [phone]

## 2019-10-11 NOTE — Therapy (Signed)
[place] MAIN [facility] SERVICES [address] [place], [place], [address] Phone: [phone]   Fax:  [phone]  Occupational Therapy Evaluation: Lymphedema  Patient Details  Name: Sabrina Morris MRN: [id number] Date of Birth: May 26, 1965 No data recorded  Encounter Date: 10/11/2019   OT End of Session - 10/11/19 1522    Visit Number 1    Number of Visits 1    Date for OT Re-Evaluation 10/11/19    OT Start Time 0804    OT Stop Time 0915    OT Time Calculation (min) 71 min    Activity Tolerance Patient tolerated treatment well;No increased pain    Behavior During Therapy WFL for tasks assessed/performed           Past Medical History:  Diagnosis Date  . Adrenal disorder ([place])   . Anemia 11/2018  . Breast cancer ([place])   . H/O bone density study 2015  . H/O colonoscopy    sch 06/12/15  . H/O colonoscopy 11/2018  . H/O endoscopy 11/2018   upper  . Lymphedema   . Lymphedema   . Migraine   . Pap smear for cervical cancer screening [id number]    Past Surgical History:  Procedure Laterality Date  . breast  Left 02/18/2017   Implant removed  . BREAST ENHANCEMENT SURGERY Bilateral 2005  . CARDIAC CATHETERIZATION  1994  . CARDIAC CATHETERIZATION  1994  . MASTECTOMY Right 02/2011  . PORT A CATH INJECTION ([place] HX)  2013    There were no vitals filed for this visit.   Subjective Assessment - 10/11/19 0851    Subjective  Sabrina Morris is referred to Occupational Therapy by Fenton Malling, PA-C for evaluation and treatment of BLE lymphedema. Pt reports she has global swelling in all 4 quadrants of her body with onset of BUE/BUQ swelling during chemotherapy in 2013, and onset of BLE/BLQ swelling ~ 2015. Pt saw [name], OTR/L, CLT  for 6 visits soon after onset. She tells me she sees a massage therapist who has provided  Manual Lymphatic Drainage (MLD)  1 x monthly for several years. Pt reports she has  non-medical grade and custom medical grade compression garments for BUE, BLE (thigh highs) and head/ neck. She has a Flexitouch sequential   pneumatic compression device, sometimes called a "pump", for head/ neck, BLE/BLQ and for BUE/BUQ. She tells me she uses this device to "pump" all 4 quadrants and her head/neck  4-5 hours/ perday to control global swelling. Pt denies SOB and dyspnea with "pump" use. Pt also has and uses BLE , BUE and head/neck  Reid Sleeve devices for HOS nightly . These devices are designed to limit lymphedema    related fibrosis in the skin. Pt reprts she does not wear LE compression garments because they hurt. She does not have a therapeutic exercise regime, or particpate in regular exercise.  Pt denies known family hx of limb swelling as she is adopted. Pt states her goal for her visit to OT today is to make sure she meets disability criterion for periodic LE care and to get a check up to limit LE progression.    Pertinent History S/p L breast Ca w/ mastectomy, ALND (1+/20LN), chemotherapy and XRT 2013-14. LUE/LUQ swelling onset 2013. adrenal disorder; cardiac cath 1994;    Limitations , global swelling in all 4 body quadrants with associated pain/ discomfort, decreased standing and walking tolerance,    Repetition Increases Symptoms  Currently in Pain? Yes    Pain Score 5     Pain Location Leg    Pain Orientation Right;Left   sides fluctuates   Pain Descriptors / Indicators Aching;Throbbing;Tightness;Tiring    Pain Type Chronic pain    Pain Relieving Factors sequential pneumatic compression ("pump"), MLD    Effect of Pain on Daily Activities limits functional ambulation and transfers, limits household management, limits social participation, limits body image,             [facility] OT Assessment - 10/11/19 0001      Assessment   Hand Dominance Right    Prior Therapy Flexitouch for head and neck and upper and lower extremities, BUE and BLE custom flat knit compression  garments, JoviPack supplemetal chip pads, BUE and BLE (thigh high) and neck Reid sleeves. "I use all pump garments 4-5 hours per day." Private massage therapist seen 1 x monthly for MLD. Has seen Maureen Dupree, OT for CDT      Precautions   Precaution Comments lymphedema      Balance Screen   Has the patient fallen in the past 6 months No    Has the patient had a decrease in activity level because of a fear of falling?  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 - now on disability - helping her sister and family - she also had breast CA and has lymphedema       IADL   Prior Level of Function Shopping I    Shopping Takes care of all shopping needs independently    Prior Level of Function Light Housekeeping I    Light Housekeeping Does personal laundry completely    Prior Level of Function Meal Prep I    Meal Prep Plans, prepares and serves adequate meals independently    Prior Level of Function Community Mobility I    Community Mobility Drives own vehicle      Mobility   Mobility Status Independent      Activity Tolerance   Activity Tolerance Endurance does not limit participation in activity      Cognition   Overall Cognitive Status Within Functional Limits for tasks assessed      Posture/Postural Control   Posture/Postural Control No significant limitations      Sensation   Light Touch Appears Intact      Coordination   Gross Motor Movements are Fluid and Coordinated Yes    Fine Motor Movements are Fluid and Coordinated Yes      ROM / Strength   AROM / PROM / Strength AROM;PROM;Strength      AROM   Overall AROM  Within functional limits for tasks performed    AROM Assessment Site Other (comment)   BUE, BLE     Strength   Overall Strength Within functional limits for tasks performed             LYMPHEDEMA/ONCOLOGY QUESTIONNAIRE - 10/11/19 0001      Type   Cancer Type R breast CA      Surgeries   Mastectomy Date 02/28/11    Number Lymph Nodes Removed --   1+/20 LN     Date Lymphedema/Swelling Started   Date --   BLE swelling onset 2014; UE onset   06/2011 during chemo     Treatment   Past Chemotherapy Treatment Yes    Past Radiation Treatment Yes    Current Hormone Treatment No    Past Hormone Therapy Yes      What other symptoms do you have   Are you Having Heaviness or Tightness Yes    Are you having pitting edema No    Do you have infections No    Is there Decreased scar mobility Yes             LUE/ LUQ subclinical, stage 0, post-mastectomy lymphedema. Leg swelling suspected to be 2/2 possible venous disease and obesity Skin  Description Hyper-Keratosis Peau' de Orange Shiny Tight Fibrotic Fatty Doughy spongy             Mild below knees, L>R   Skin dry Flaky Erythema Macerated   Slight at legs      Color Redness Present Pallor Blanching Hemosiderin Staining Other      absent     Odor Malodorous Yeast Fungal infection  Absent      x   Temperature Warm Cool wnl       x    Pitting Edema   1+ 2+ 3+ 4+ Non-pitting        x   Girth Symmetrical Asymmetrical                   Distribution   x   Feet to tibial tuberosity   Stemmer Sign Positive Negative     - hand, - toes   Lymphorrhea History Of:  Present Absent      x   Wounds History Of Present Absent Venous Arterial Pressure Size      X          Signs of Infection Redness Warmth Erythema Acute Swelling Drainage Borders   absent absent                Sensation Light Touch Deep pressure Hypersensitivty   Present Impaired Present Impaired Absent Impaired   x  x  x     Nails WNL Fungus Other   x    x Hair Growth Symmetrical Asymmetrical   x    Skin Creases Base of toes  Ankles   Base of Fingers Medial Thighs         Abdominal pannus Medial legs     absent                   OT Treatments/Exercises (OP) - 10/11/19 0001      Manual Therapy   Manual Therapy Edema management                 OT Education - 10/11/19 1521    Education Details Provided Pt and family education regarding lymphatic structure and function, etiologies, onset patterns and stages of progression. Discussed  impact of obesity on lymphatic function. Outlined Complete Decongestive Therapy (CDT)  as standard of care and provided in depth information regarding 4 primary components of both Intensive and Self Management Phases, including Manual Lymph Drainage (MLD), compression wrapping and garments, skin care, and therapeutic exercise.   Frank discussion of high burden of care,  Discussed  Importance of daily, ongoing LE self-care essential to retaining clinical gains and limiting progression.  Lastly, reviewed lymphedema precautions, including cellulitis risk and difficulty with wound healing. Provided printed Lymphedema Workbook for reference. Pt verbalized understanding that assistance with home program is essential to meet OT   rehab goals for LE care.    Person(s) Educated Patient    Methods Explanation;Demonstration;Handout    Comprehension Verbalized understanding;Returned demonstration               OT Long Term Goals - 10/11/19 1525      OT LONG TERM GOAL #1   Title Pt will be able to verbalize signs and symptoms of cellulitis infection and identify lymphedema precautions using printed resource (modified independence) for reference to decrease infection risk and limit LE progression over time.    Baseline Min A    Time 1    Period Days    Status Achieved    Target Date 10/11/19      OT LONG TERM GOAL #2   Title Pt will demonstrate correct surgical scar massage techniques after skilled teaching to redunce discomfort at surgical incision sites.    Baseline Max A    Period Days    Status Achieved    Target Date 10/11/19                 Plan - 10/11/19  1536    Clinical Impression Statement Sabrina Morris is a 54 y o female presenting with stage 0 (sub-clinical) LUE/LUQ , post mastectomy lymphedema. She also presents with swelling him legs below the knees of unknown etiology. This OT suspects systemic fluid retention may be a contributing factor. Pt also reports head and neck, abdominal and RUE/RUQ swelling, but swelling in these quadrants is not visible, or palpable today. Complete Decongestive Therapy (CDT), the gold standad for lymphedema care, cosists of Intensive and Self-Management Phases and includes manual lymphatic drainage (MLD), skin care, therapeutic exercise and comression wraps, garments and/or devices. CDT for lymphedema care is not indicated in this case at this time. Pt has obtained all tools, skills and strategies through previous therapy and training for self management, including OTS and custom compression garments for all 4 limbs (non-compliant by report), a sequential pneumatic compression device (Flexitouch used on all 4 limbs and head 4-5 hrs/daily per report) and she sees a private MT for MLD 1 x monthly. Condition appears stable at present and has minimal affect on functional performance, except for c/o leg pain and swelling with dependent positioning on long care rides, and c/o decreased standing /walking tolerance > 30 minutes results in increased leg pain ( throbbing, aching) and swelling. Instead of CDT, Pt will benefit from cosistent leg elevation   when in a dependent    position for >30 minutes. Pt will benefit from  therapeutic exercise which activates the lymphatic muscles pumps   such as yoga for deep abdominal breathing and swimming. Pt will benefit from light  , well fitting , knee length compression stockings for daily use to limit leg pain and  swelling when standing and walking. She may benefit from a vascular evaluation as pain she describes may be due to venous disease. Pt will benefit from a prophylactic  ccompression arm sleeve or affected UE when lifting, engaging in heavy exercise and air travel in keeping w/ lymphedema precautions. Pt may benefit from an off the shelf, body shaper type compression garment, such as Spanx , for abdominal swelling from time to time. Sabrina Morris is discharged from OT for CDT today. All goals met.    OT Occupational Profile and History Comprehensive Assessment- Review of records and extensive additional review of physical, cognitive, psychosocial history related to current functional performance    Occupational performance deficits (Please refer to  evaluation for details): IADL's;Social Participation;Other   body image   Rehab Potential --   N/A   Comorbidities Affecting Occupational Performance: May have comorbidities impacting occupational performance   suspect systemic fluid retention   Modification or Assistance to Complete Evaluation  No modification of tasks or assist necessary to complete eval    OT Treatment/Interventions Patient/family education    Plan Pt is DC from OT for LE care. No skilled therapy is indicated at this time.    Recommended Other Services Cont with MT for MLD. Wear light dute, comfortable , OTS compression knee highs for standing/ walking >15    Consulted and Agree with Plan of Care Patient           Patient will benefit from skilled therapeutic intervention in order to improve the following deficits and impairments:           Visit Diagnosis: Postmastectomy lymphedema syndrome - Plan: Ot plan of care cert/re-cert    Problem List Patient Active Problem List   Diagnosis Date Noted  . Fat deposits 07/15/2019  . Iron deficiency anemia due to chronic blood loss 10/26/2018  . Adrenal disorder ([place]) 05/05/2017  . Postmenopausal 07/02/2015  . Lymphedema of face 06/27/2015  . Hypokalemia 11/30/2014  . Accumulation of fluid in tissues 10/03/2014  . Hypercholesteremia 10/03/2014  . Acquired lymphedema 10/03/2014  . Menopausal and  perimenopausal disorder 10/03/2014  . Borderline diabetes 10/03/2014  . Abnormal weight gain 10/03/2014  . Ventricular pre-excitation with arrhythmia 10/03/2014  . GERD (gastroesophageal reflux disease) 09/22/2014  . Anxiety 09/11/2014  . 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 Lymphedema of leg 01/04/2014  . History of asthma 10/29/2011  . Menopause praecox 09/10/2011  . Post-lymphadenectomy lymphedema of arm 09/10/2011  . History of breast cancer 01/01/2011  . Headache, migraine 06/18/2006    Andrey Spearman, MS, OTR/L, [facility] 10/11/19 4:09 PM   [place] MAIN [facility] SERVICES [address] [place], [place], [address] Phone: [phone]   Fax:  [phone]  Name: Sabrina Morris MRN: [id number] Date of Birth: Nov 18, 1965

## 2019-10-13 ENCOUNTER — Ambulatory Visit: Payer: Medicare Other | Admitting: Occupational Therapy

## 2019-10-13 DIAGNOSIS — R1310 Dysphagia, unspecified: Secondary | ICD-10-CM | POA: Diagnosis not present

## 2019-10-13 DIAGNOSIS — Z9889 Other specified postprocedural states: Secondary | ICD-10-CM | POA: Diagnosis not present

## 2019-10-13 DIAGNOSIS — Z8719 Personal history of other diseases of the digestive system: Secondary | ICD-10-CM | POA: Diagnosis not present

## 2019-10-13 DIAGNOSIS — K449 Diaphragmatic hernia without obstruction or gangrene: Secondary | ICD-10-CM | POA: Diagnosis not present

## 2019-10-13 NOTE — Telephone Encounter (Signed)
[id number]. Order for home sleep study has been faxed to [facility] and their office will contact her

## 2019-10-13 NOTE — Telephone Encounter (Signed)
Pt calling again and is requesting to have a call back from [place]. Please advise.

## 2019-10-18 ENCOUNTER — Encounter: Payer: Medicare Other | Admitting: Occupational Therapy

## 2019-10-18 ENCOUNTER — Encounter: Payer: Self-pay | Admitting: Physician Assistant

## 2019-10-18 DIAGNOSIS — M25551 Pain in right hip: Secondary | ICD-10-CM

## 2019-10-18 DIAGNOSIS — M25552 Pain in left hip: Secondary | ICD-10-CM

## 2019-10-18 MED ORDER — MELOXICAM 15 MG PO TABS: 15.0000 mg | ORAL_TABLET | Freq: Every day | ORAL | 3 refills | Status: DC

## 2019-10-20 ENCOUNTER — Encounter: Payer: Medicare Other | Admitting: Occupational Therapy

## 2019-10-25 ENCOUNTER — Encounter: Payer: Medicare Other | Admitting: Occupational Therapy

## 2019-10-27 ENCOUNTER — Encounter: Payer: Medicare Other | Admitting: Occupational Therapy

## 2019-11-01 ENCOUNTER — Encounter: Payer: Medicare Other | Admitting: Occupational Therapy

## 2019-11-03 ENCOUNTER — Encounter: Payer: Medicare Other | Admitting: Occupational Therapy

## 2019-11-08 ENCOUNTER — Encounter: Payer: Medicare Other | Admitting: Occupational Therapy

## 2019-11-10 ENCOUNTER — Encounter: Payer: Medicare Other | Admitting: Occupational Therapy

## 2019-11-15 ENCOUNTER — Encounter: Payer: Medicare Other | Admitting: Occupational Therapy

## 2019-11-17 ENCOUNTER — Encounter: Payer: Medicare Other | Admitting: Occupational Therapy

## 2019-11-24 ENCOUNTER — Telehealth: Payer: Self-pay | Admitting: *Deleted

## 2019-11-24 ENCOUNTER — Encounter: Payer: Self-pay | Admitting: Internal Medicine

## 2019-11-24 NOTE — Telephone Encounter (Signed)
Patient sent the following mychart msg. Dr. Jacinto Reap- please advise    Dr B.: For a month my skin itches around my neck.  I've not changed any skin products as I don't use many. I researched erythrocytosis  your dx from last visit & that can cause itchy skin. Should I have labs order for October done sooner?  Thank you

## 2019-11-25 NOTE — Telephone Encounter (Signed)
Per Dr. Jacinto Reap - he will call patient.

## 2019-11-28 ENCOUNTER — Telehealth: Payer: Self-pay | Admitting: Internal Medicine

## 2019-11-28 DIAGNOSIS — D751 Secondary polycythemia: Secondary | ICD-10-CM

## 2019-11-28 DIAGNOSIS — D500 Iron deficiency anemia secondary to blood loss (chronic): Secondary | ICD-10-CM

## 2019-11-28 NOTE — Telephone Encounter (Signed)
11/28/2019  Left VM informing pt to RTC for labs (cbc/Jak-2 mutation/LDH) per Dr. B for pruritus skin rash in the context of slightly elevated Hb SRW

## 2019-11-28 NOTE — Addendum Note (Signed)
Addended by: Gloris Ham on: 11/28/2019 09:10 AM   Modules accepted: Orders

## 2019-11-28 NOTE — Telephone Encounter (Signed)
Spoke with patient. apts provided. Patient requested to have labs drawn on 9/20 since she will be out of town this week.  She requested that a Ferritin level be added to her labs. Dr. Rogue Bussing - please advise.

## 2019-11-28 NOTE — Telephone Encounter (Signed)
Verbal order to add ferritin level to patient's labs per Dr. Rogue Bussing

## 2019-11-28 NOTE — Telephone Encounter (Signed)
On 9/10-left voicemail for the patient regarding her concerns for pruritus skin rash in the context of slightly elevated hemoglobin.  Recommend checking labs. Will call with results.   H/T-I would recommend checking the following labs [cbc/Jak-2 mutation;LDH- already ordered] for the patient; please inform the patient.  Colletta Maryland- please schedule the labs.

## 2019-11-29 ENCOUNTER — Encounter: Payer: Self-pay | Admitting: Physician Assistant

## 2019-11-30 ENCOUNTER — Other Ambulatory Visit: Payer: Medicare Other

## 2019-12-03 ENCOUNTER — Other Ambulatory Visit: Payer: Self-pay | Admitting: Physician Assistant

## 2019-12-03 DIAGNOSIS — F419 Anxiety disorder, unspecified: Secondary | ICD-10-CM

## 2019-12-03 NOTE — Telephone Encounter (Signed)
Requested medication (s) are due for refill today: yes  Requested medication (s) are on the active medication list: yes  Last refill:  05/20/19  Future visit scheduled: no  Notes to clinic:  med not delegated to NT to RF   Requested Prescriptions  Pending Prescriptions Disp Refills   LORazepam (ATIVAN) 1 MG tablet [Pharmacy Med Name: LORAZEPAM 1 MG TAB] 60 tablet     Sig: TAKE 1 TABLET BY MOUTH TWICE A DAY AS NEEDED      Not Delegated - Psychiatry:  Anxiolytics/Hypnotics Failed - 12/03/2019 10:03 AM      Failed - This refill cannot be delegated      Failed - Urine Drug Screen completed in last 360 days.      Passed - Valid encounter within last 6 months    Recent Outpatient Visits           2 months ago Other migraine without status migrainosus, not intractable   [place], [place], [place]   7 months ago [place], [place]   1 year ago Moderate persistent asthmatic bronchitis with acute exacerbation   [facility] [place], [place], [place]   1 year ago Annual physical exam   [facility] [place], [place], [place]   2 years ago [place], [place], [place]

## 2019-12-05 ENCOUNTER — Other Ambulatory Visit: Payer: Self-pay

## 2019-12-05 ENCOUNTER — Inpatient Hospital Stay: Payer: Medicare Other | Attending: Internal Medicine

## 2019-12-05 DIAGNOSIS — D751 Secondary polycythemia: Secondary | ICD-10-CM | POA: Insufficient documentation

## 2019-12-05 DIAGNOSIS — D500 Iron deficiency anemia secondary to blood loss (chronic): Secondary | ICD-10-CM | POA: Diagnosis not present

## 2019-12-05 LAB — CBC WITH DIFFERENTIAL/PLATELET
Abs Immature Granulocytes: 0.02 10*3/uL (ref 0.00–0.07)
Basophils Absolute: 0 10*3/uL (ref 0.0–0.1)
Basophils Relative: 0 %
Eosinophils Absolute: 0.2 10*3/uL (ref 0.0–0.5)
Eosinophils Relative: 2 %
HCT: 46.7 % — ABNORMAL HIGH (ref 36.0–46.0)
Hemoglobin: 16.4 g/dL — ABNORMAL HIGH (ref 12.0–15.0)
Immature Granulocytes: 0 %
Lymphocytes Relative: 20 %
Lymphs Abs: 1.9 10*3/uL (ref 0.7–4.0)
MCH: 31 pg (ref 26.0–34.0)
MCHC: 35.1 g/dL (ref 30.0–36.0)
MCV: 88.3 fL (ref 80.0–100.0)
Monocytes Absolute: 0.6 10*3/uL (ref 0.1–1.0)
Monocytes Relative: 7 %
Neutro Abs: 6.5 10*3/uL (ref 1.7–7.7)
Neutrophils Relative %: 71 %
Platelets: 245 10*3/uL (ref 150–400)
RBC: 5.29 MIL/uL — ABNORMAL HIGH (ref 3.87–5.11)
RDW: 13.2 % (ref 11.5–15.5)
WBC: 9.2 10*3/uL (ref 4.0–10.5)
nRBC: 0 % (ref 0.0–0.2)

## 2019-12-05 LAB — FERRITIN: Ferritin: 45 ng/mL (ref 11–307)

## 2019-12-05 LAB — LACTATE DEHYDROGENASE: LDH: 139 U/L (ref 98–192)

## 2019-12-06 ENCOUNTER — Encounter: Payer: Self-pay | Admitting: Internal Medicine

## 2019-12-06 ENCOUNTER — Telehealth: Payer: Self-pay

## 2019-12-06 NOTE — Telephone Encounter (Signed)
Copied from [place] [id number]. Topic: General - Inquiry >> Dec 06, 2019 11:47 AM Scherrie Gerlach wrote: Reason for CRM: pt states she needs the letter for jury duty printed and signed by Anderson [place].  The letter on mychart was not signed and it needs to be signed by licensed dr. Pt would like to pick up tomorrow.

## 2019-12-07 ENCOUNTER — Encounter: Payer: Self-pay | Admitting: Physician Assistant

## 2019-12-07 ENCOUNTER — Telehealth: Payer: Self-pay | Admitting: Internal Medicine

## 2019-12-07 NOTE — Telephone Encounter (Signed)
Letter printed.

## 2019-12-07 NOTE — Telephone Encounter (Signed)
Patient advised as directed below. 

## 2019-12-07 NOTE — Telephone Encounter (Signed)
On 9/22-spoke to patient regarding the results of her blood work overall stable.  Awaiting JAK2.  Agree with CPAP GB

## 2019-12-12 ENCOUNTER — Encounter: Payer: Self-pay | Admitting: Internal Medicine

## 2019-12-14 ENCOUNTER — Ambulatory Visit
Admission: RE | Admit: 2019-12-14 | Discharge: 2019-12-14 | Disposition: A | Discharge status: Home / Self Care | Payer: Medicare Other | Source: Ambulatory Visit | Attending: Physician Assistant | Admitting: Physician Assistant

## 2019-12-14 ENCOUNTER — Other Ambulatory Visit: Payer: Self-pay

## 2019-12-14 ENCOUNTER — Ambulatory Visit (INDEPENDENT_AMBULATORY_CARE_PROVIDER_SITE_OTHER): Payer: Medicare Other | Admitting: Physician Assistant

## 2019-12-14 ENCOUNTER — Ambulatory Visit
Admission: RE | Admit: 2019-12-14 | Discharge: 2019-12-14 | Disposition: A | Discharge status: Home / Self Care | Payer: Medicare Other | Attending: Physician Assistant | Admitting: Physician Assistant

## 2019-12-14 ENCOUNTER — Encounter: Payer: Self-pay | Admitting: Physician Assistant

## 2019-12-14 DIAGNOSIS — M545 Low back pain, unspecified: Secondary | ICD-10-CM

## 2019-12-14 DIAGNOSIS — M25551 Pain in right hip: Secondary | ICD-10-CM | POA: Diagnosis not present

## 2019-12-14 DIAGNOSIS — Y92410 Unspecified street and highway as the place of occurrence of the external cause: Secondary | ICD-10-CM | POA: Insufficient documentation

## 2019-12-14 DIAGNOSIS — M25552 Pain in left hip: Secondary | ICD-10-CM | POA: Diagnosis not present

## 2019-12-14 DIAGNOSIS — S3992XA Unspecified injury of lower back, initial encounter: Secondary | ICD-10-CM | POA: Diagnosis not present

## 2019-12-14 DIAGNOSIS — T7840XA Allergy, unspecified, initial encounter: Secondary | ICD-10-CM | POA: Diagnosis not present

## 2019-12-14 DIAGNOSIS — Z87440 Personal history of urinary (tract) infections: Secondary | ICD-10-CM

## 2019-12-14 LAB — POCT URINALYSIS DIPSTICK
Bilirubin, UA: NEGATIVE
Blood, UA: NEGATIVE
Glucose, UA: NEGATIVE
Ketones, UA: NEGATIVE
Leukocytes, UA: NEGATIVE
Nitrite, UA: NEGATIVE
Protein, UA: NEGATIVE
Spec Grav, UA: >=1.03 — AB (ref 1.010–1.025)
Urobilinogen, UA: 0.2 E.U./dL
pH, UA: 5 (ref 5.0–8.0)

## 2019-12-14 LAB — JAK2 EXONS 12-15

## 2019-12-14 LAB — JAK2  V617F QUAL. WITH REFLEX TO EXON 12: Reflex:: 15

## 2019-12-14 MED ORDER — PREDNISONE 10 MG PO TABS: ORAL_TABLET | ORAL | 0 refills | Status: DC

## 2019-12-14 MED ORDER — HYDROXYZINE HCL 25 MG PO TABS: 25.0000 mg | ORAL_TABLET | Freq: Three times a day (TID) | ORAL | 0 refills | Status: DC | PRN
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
## 2019-12-15 ENCOUNTER — Encounter: Payer: Self-pay | Admitting: Internal Medicine

## 2019-12-15 ENCOUNTER — Encounter: Payer: Self-pay | Admitting: Physician Assistant

## 2019-12-15 ENCOUNTER — Telehealth: Payer: Self-pay

## 2019-12-15 DIAGNOSIS — S32050A Wedge compression fracture of fifth lumbar vertebra, initial encounter for closed fracture: Secondary | ICD-10-CM

## 2019-12-15 DIAGNOSIS — D751 Secondary polycythemia: Secondary | ICD-10-CM

## 2019-12-15 DIAGNOSIS — D500 Iron deficiency anemia secondary to blood loss (chronic): Secondary | ICD-10-CM

## 2019-12-15 NOTE — Telephone Encounter (Signed)
Patient sent this note through mychart:   Hey Dr. B: I see test results you were waiting on are negative.  Can I cancel my appt on the 12th since labs are done ?   Thank you  Sabrina Morris   Please advise Dr. Jacinto Reap.

## 2019-12-15 NOTE — Telephone Encounter (Signed)
T-Please inform pt that it is ok to cancel her next appt.   C- Follow up in 6 months- MD;labs- cbc/bmp/iron studies/ferritin- GB

## 2019-12-15 NOTE — Telephone Encounter (Signed)
Mychart message sent. Lab orders put in.

## 2019-12-15 NOTE — Addendum Note (Signed)
Addended by: Delice Bison E on: 12/15/2019 02:58 PM   Modules accepted: Orders

## 2019-12-16 ENCOUNTER — Telehealth: Payer: Self-pay | Admitting: Internal Medicine

## 2019-12-16 ENCOUNTER — Telehealth: Payer: Self-pay

## 2019-12-16 NOTE — Telephone Encounter (Signed)
On 9/30 left voicemail for the patient informing her negative JAK2 testing. Recommend follow-up in 6 months.

## 2019-12-16 NOTE — Telephone Encounter (Signed)
Copied from [place] [phone]. Topic: General - Other >> Dec 16, 2019 10:49 AM Leward Quan A wrote: Reason for CRM: Patient called to ask Lillia Abed to please send her an update on her Xray that was done on 12/14/2019. Please send a message to My Chart

## 2019-12-16 NOTE — Telephone Encounter (Signed)
12/16/2019  Per Lennox Grumbles, pt to RTC in 6 months for labs (few days prior) and MD visit. Appts scheduled for 06/13/20 @ 10 am and 06/15/20 @ 10 am. Called and confirmed appts with pt. SRW

## 2019-12-16 NOTE — Telephone Encounter (Signed)
Please advise 

## 2019-12-19 ENCOUNTER — Telehealth: Payer: Self-pay
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
## 2019-12-19 NOTE — Telephone Encounter (Signed)
LMTCB-if patient calls back ok for [facility] nurse to give results °

## 2019-12-20 ENCOUNTER — Encounter: Payer: Self-pay | Admitting: Physician Assistant

## 2019-12-22 ENCOUNTER — Telehealth: Payer: Self-pay

## 2019-12-22 ENCOUNTER — Encounter: Payer: Self-pay | Admitting: Physician Assistant

## 2019-12-22 DIAGNOSIS — M8589 Other specified disorders of bone density and structure, multiple sites: Secondary | ICD-10-CM

## 2019-12-22 NOTE — Telephone Encounter (Signed)
-----   Message from Doristine Devoid, [place] sent at 12/21/2019  7:38 PM EDT ----- LMTCB or view results in my chart-[facility] nurse to give patient results

## 2019-12-22 NOTE — Telephone Encounter (Signed)
Written by Mar Daring, PA-C on 12/16/2019 1:38 PM EDT Seen by patient Sabrina Morris on 12/21/2019 10:15 PM

## 2019-12-22 NOTE — Addendum Note (Signed)
Addended by: Ashley Royalty E on: 12/22/2019 08:28 AM   Modules accepted: Orders

## 2019-12-22 NOTE — Addendum Note (Signed)
Addended by: Mar Daring on: 12/22/2019 09:54 AM   Modules accepted: Orders

## 2019-12-22 NOTE — Addendum Note (Signed)
Addended by: Mar Daring on: 12/22/2019 01:37 PM   Modules accepted: Orders

## 2019-12-22 NOTE — Addendum Note (Signed)
Addended by: Mar Daring on: 12/22/2019 09:15 AM   Modules accepted: Orders

## 2019-12-24 ENCOUNTER — Other Ambulatory Visit: Payer: Self-pay | Admitting: Physician Assistant

## 2019-12-24 DIAGNOSIS — E876 Hypokalemia: Secondary | ICD-10-CM

## 2019-12-26 ENCOUNTER — Encounter: Payer: Self-pay | Admitting: Physician Assistant

## 2019-12-26 DIAGNOSIS — E876 Hypokalemia: Secondary | ICD-10-CM

## 2019-12-26 MED ORDER — POTASSIUM CHLORIDE CRYS ER 10 MEQ PO TBCR: 10.0000 meq | EXTENDED_RELEASE_TABLET | Freq: Every day | ORAL | 1 refills | Status: DC

## 2019-12-27 ENCOUNTER — Other Ambulatory Visit: Payer: Medicare Other

## 2019-12-27 ENCOUNTER — Ambulatory Visit: Payer: Medicare Other | Admitting: Internal Medicine

## 2019-12-30 DIAGNOSIS — S32050A Wedge compression fracture of fifth lumbar vertebra, initial encounter for closed fracture: Secondary | ICD-10-CM | POA: Diagnosis not present

## 2020-01-19 ENCOUNTER — Other Ambulatory Visit: Payer: Self-pay | Admitting: Physician Assistant
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
## 2020-01-20 ENCOUNTER — Telehealth: Payer: Self-pay

## 2020-01-20 NOTE — Telephone Encounter (Signed)
Copied from [place] [phone]. Topic: General - Other >> Jan 20, 2020 10:09 AM Keene Breath wrote: Reason for CRM: Patient called to ask if the nurse would call her regarding a flu shot exemption letter which is in her [facility] My Chart.  Patient stated she cannot get into the system.  She only needs a copy of the form.  Please advise and call patient to discuss at [phone]

## 2020-01-20 NOTE — Telephone Encounter (Signed)
Left message to call back. We are unable to get into [facility].

## 2020-01-26 DIAGNOSIS — G4733 Obstructive sleep apnea (adult) (pediatric): Secondary | ICD-10-CM | POA: Diagnosis not present

## 2020-01-31 ENCOUNTER — Encounter: Payer: Self-pay | Admitting: Physician Assistant

## 2020-01-31 DIAGNOSIS — D508 Other iron deficiency anemias: Secondary | ICD-10-CM

## 2020-02-07 ENCOUNTER — Other Ambulatory Visit: Payer: Medicare Other

## 2020-02-10 ENCOUNTER — Encounter: Payer: Self-pay | Admitting: Physician Assistant

## 2020-02-17 ENCOUNTER — Other Ambulatory Visit: Payer: Self-pay | Admitting: Physician Assistant

## 2020-02-17 DIAGNOSIS — D508 Other iron deficiency anemias: Secondary | ICD-10-CM | POA: Diagnosis not present

## 2020-02-18 LAB — CBC WITH DIFFERENTIAL/PLATELET
Basophils Absolute: 0 10*3/uL (ref 0.0–0.2)
Basos: 1 %
EOS (ABSOLUTE): 0.2 10*3/uL (ref 0.0–0.4)
Eos: 3 %
Hematocrit: 45.9 % (ref 34.0–46.6)
Hemoglobin: 15.8 g/dL (ref 11.1–15.9)
Immature Grans (Abs): 0 10*3/uL (ref 0.0–0.1)
Immature Granulocytes: 0 %
Lymphocytes Absolute: 1.8 10*3/uL (ref 0.7–3.1)
Lymphs: 29 %
MCH: 31.4 pg (ref 26.6–33.0)
MCHC: 34.4 g/dL (ref 31.5–35.7)
MCV: 91 fL (ref 79–97)
Monocytes Absolute: 0.6 10*3/uL (ref 0.1–0.9)
Monocytes: 10 %
Neutrophils Absolute: 3.5 10*3/uL (ref 1.4–7.0)
Neutrophils: 57 %
Platelets: 232 10*3/uL (ref 150–450)
RBC: 5.03 x10E6/uL (ref 3.77–5.28)
RDW: 12.7 % (ref 11.7–15.4)
WBC: 6.1 10*3/uL (ref 3.4–10.8)

## 2020-02-18 LAB — IRON,TIBC AND FERRITIN PANEL
Ferritin: 100 ng/mL (ref 15–150)
Iron Saturation: 27 % (ref 15–55)
Iron: 107 ug/dL (ref 27–159)
Total Iron Binding Capacity: 390 ug/dL (ref 250–450)
UIBC: 283 ug/dL (ref 131–425)

## 2020-02-18 LAB — BASIC METABOLIC PANEL
BUN/Creatinine Ratio: 23 (ref 9–23)
BUN: 19 mg/dL (ref 6–24)
CO2: 22 mmol/L (ref 20–29)
Calcium: 9.6 mg/dL (ref 8.7–10.2)
Chloride: 97 mmol/L (ref 96–106)
Creatinine, Ser: 0.82 mg/dL (ref 0.57–1.00)
GFR calc Af Amer: 93 mL/min/{1.73_m2} (ref >59)
GFR calc non Af Amer: 81 mL/min/{1.73_m2} (ref >59)
Glucose: 96 mg/dL (ref 65–99)
Potassium: 4.3 mmol/L (ref 3.5–5.2)
Sodium: 141 mmol/L (ref 134–144)

## 2020-02-18 LAB — THYROID PANEL WITH TSH
Free Thyroxine Index: 1.3 (ref 1.2–4.9)
T3 Uptake Ratio: 21 % — ABNORMAL LOW (ref 24–39)
T4, Total: 6.4 ug/dL (ref 4.5–12.0)
TSH: 2.2 u[IU]/mL (ref 0.450–4.500)

## 2020-02-20 ENCOUNTER — Encounter: Payer: Self-pay | Admitting: Physician Assistant

## 2020-02-20 ENCOUNTER — Other Ambulatory Visit: Payer: Self-pay | Admitting: Physician Assistant

## 2020-02-20 DIAGNOSIS — M5030 Other cervical disc degeneration, unspecified cervical region: Secondary | ICD-10-CM
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
## 2020-02-22 ENCOUNTER — Telehealth: Payer: Self-pay

## 2020-02-22 DIAGNOSIS — S32050D Wedge compression fracture of fifth lumbar vertebra, subsequent encounter for fracture with routine healing: Secondary | ICD-10-CM | POA: Diagnosis not present

## 2020-02-22 NOTE — Telephone Encounter (Signed)
LMTCB-Ok for [facility] nurse to give message. We are unable to get in [facility] chart and we can't print paper work from another facility.

## 2020-02-22 NOTE — Telephone Encounter (Signed)
Patient advised.

## 2020-02-22 NOTE — Telephone Encounter (Signed)
Copied from [place] [id number]. Topic: General - Inquiry >> Feb 22, 2020  9:15 AM Alanda Slim E wrote: Reason for CRM: Pt called to see if Jennifer's assistant was able to get a printout of her flu exemption off of her [facility] mychart/ please advise

## 2020-02-25 ENCOUNTER — Other Ambulatory Visit: Payer: Self-pay | Admitting: Physician Assistant

## 2020-02-25 DIAGNOSIS — G4733 Obstructive sleep apnea (adult) (pediatric): Secondary | ICD-10-CM | POA: Diagnosis not present

## 2020-02-25 DIAGNOSIS — I890 Lymphedema, not elsewhere classified: Secondary | ICD-10-CM
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
## 2020-02-28 ENCOUNTER — Other Ambulatory Visit: Payer: Medicare Other

## 2020-03-02 ENCOUNTER — Encounter: Payer: Self-pay | Admitting: Physician Assistant

## 2020-03-06 ENCOUNTER — Other Ambulatory Visit: Payer: Self-pay | Admitting: Physician Assistant

## 2020-03-06 DIAGNOSIS — B001 Herpesviral vesicular dermatitis: Secondary | ICD-10-CM
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
## 2020-03-07 ENCOUNTER — Ambulatory Visit: Payer: Medicare Other | Admitting: Obstetrics and Gynecology

## 2020-03-27 DIAGNOSIS — G4733 Obstructive sleep apnea (adult) (pediatric): Secondary | ICD-10-CM | POA: Diagnosis not present

## 2020-04-02 ENCOUNTER — Other Ambulatory Visit: Payer: Self-pay

## 2020-04-02 ENCOUNTER — Ambulatory Visit
Admission: RE | Admit: 2020-04-02 | Discharge: 2020-04-02 | Disposition: A | Discharge status: Home / Self Care | Payer: Medicare Other | Source: Ambulatory Visit | Attending: Physician Assistant | Admitting: Physician Assistant

## 2020-04-02 DIAGNOSIS — M8589 Other specified disorders of bone density and structure, multiple sites: Secondary | ICD-10-CM | POA: Diagnosis not present

## 2020-04-02 DIAGNOSIS — Z853 Personal history of malignant neoplasm of breast: Secondary | ICD-10-CM | POA: Diagnosis not present

## 2020-04-02 DIAGNOSIS — Z780 Asymptomatic menopausal state: Secondary | ICD-10-CM | POA: Diagnosis not present

## 2020-04-03 ENCOUNTER — Encounter: Payer: Self-pay | Admitting: Physician Assistant

## 2020-04-16 NOTE — Progress Notes (Signed)
Chief Complaint  Patient presents with  . Gynecologic Exam    No concerns    HPI:      Sabrina Morris is a 55 y.o. G0P0000 who LMP was No LMP recorded. Patient is postmenopausal., presents today for her annual examination.  Her menses are absent.  She does not have PMB.  She does have some vasomotor sx. She is using cream form of estradiol, testosterone, and progesterone meds again, prescribed by NP in [facility].  Did better with oral but it was getting stuck in her throat so changed this yr. Had absorption issues with cream in past due to lymphedema so having increased menopausal sx now. Hx of breast cancer--aware of risks but wants meds for quality of life. Hx of dominant estrogen levels and  progesterone and testosterone are low.   She is not sexually active. She does not have vaginal dryness. Last Pap: 05/29/16  Results were: no abnormalities/neg HPV DNA  Last mammogram: 09/30/19 Results were: normal--routine follow-up in 12 months LT Breast only. Had RT mastectomy in past.  There is a FH of breast cancer in her mat 1/2 sister, age 23. She had neg genetic panel testing at [facility]. Pt had neg BRCA testing at [facility] in the past, no panel testing done. There is no FH of ovarian cancer. Pt is adopted. The patient does do self-breast exams.  Colonoscopy: colonoscopy 9/20 at [facility] with abnormalities. Repeat due after 3-5 yrs. DEXA: 1/22 at [facility]; osteopenia in spine and hip  Tobacco use: The patient denies current or previous tobacco use. Alcohol use: none No drug use. Exercise: not active  She does get adequate calcium and Vitamin D in her diet.  Labs with PCP. Hx of pre-DM, borderline lipids.    Past Medical History:  Diagnosis Date  . Adrenal disorder ([place])   . Anemia 11/2018  . Breast cancer ([place])   . H/O bone density study 2015  . H/O colonoscopy    sch 06/12/15  . H/O colonoscopy 11/2018  . H/O endoscopy 11/2018   upper  . Lymphedema   . Lymphedema   . Migraine   .  Pap smear for cervical cancer screening [id number]    Past Surgical History:  Procedure Laterality Date  . breast  Left 02/18/2017   Implant removed  . BREAST ENHANCEMENT SURGERY Bilateral 2005  . CARDIAC CATHETERIZATION  1994  . CARDIAC CATHETERIZATION  1994  . MASTECTOMY Right 02/2011  . PORT A CATH INJECTION ([place] HX)  2013    Family History  Adopted: Yes  Problem Relation Age of Onset  . Breast cancer Sister 28       1/2 sister; Genetic testing neg at [place] History  . Marital status: Single    Spouse name: Not on file  . Number of children: Not on file  . Years of education: Not on file  . Highest education level: Not on file  Occupational History  . Occupation: Programmer, multimedia: [employer]    Comment: Full Time  Tobacco Use  . Smoking status: Never Smoker  . Smokeless tobacco: Never Used  Vaping Use  . Vaping Use: Never used  Substance and Sexual Activity  . Alcohol use: No  . Drug use: No  . Sexual activity: Not Currently    Birth control/protection: Post-menopausal  Other Topics Concern  . 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  No smoking or alcohol. Lives at home/with sister.    Social Determinants of Health   Financial Resource Strain: Not on file  Food Insecurity: Not on file  Transportation Needs: Not on file  Physical Activity: Not on file  Stress: Not on file  Social Connections: Not on file  Intimate Partner Violence: Not on file    Current Outpatient Medications on File Prior to Visit  Medication Sig Dispense Refill  . butalbital-acetaminophen-caffeine (BAC) 50-325-40 MG tablet TAKE 1 TO 2 TABLETS BY MOUTH EVERY 6 HOURS AS NEEDED FOR HEADACHE 60 tablet 5  . calcium carbonate 100 mg/ml SUSP Take by mouth.    . Cholecalciferol (VITAMIN D3) 5000 units TABS Take by mouth.    . cyclobenzaprine (FLEXERIL) 5 MG  tablet TAKE 1 TABLET BY MOUTH 3 TIMES A DAY AS NEEDED 90 tablet 1  . docusate sodium (COLACE) 100 MG capsule Take by mouth.    . fluocinonide (LIDEX) 0.05 % external solution Apply 1 application topically 2 (two) times daily. 60 mL 3  . furosemide (LASIX) 20 MG tablet TAKE 2 TABLETS BY MOUTH EVERY DAY 60 tablet 5  . ibuprofen (ADVIL) 600 MG tablet TAKE 1 TABLET ($RemoveB'600MG'ZflQCdLM$  TOTAL) BY MOUTH EVERY 8 (EIGHT) HOURS AS NEEDED. 360 tablet 1  . LORazepam (ATIVAN) 1 MG tablet TAKE 1 TABLET BY MOUTH TWICE A DAY AS NEEDED 60 tablet 5  . meloxicam (MOBIC) 15 MG tablet Take 1 tablet (15 mg total) by mouth daily. 30 tablet 3  . Multiple Vitamin (MULTIVITAMIN) capsule Take by mouth.    . nystatin ointment (MYCOSTATIN) Apply topically.    Marland Kitchen oxyCODONE-acetaminophen (PERCOCET/ROXICET) 5-325 MG tablet TAKE 1 TO 2 TABLETS BY MOUTH EVERY 6 HOURS AS NEEDED FOR SEVERE PAIN 30 tablet 0  . phentermine (ADIPEX-P) 37.5 MG tablet TAKE 1/2 TO 1 TABLET BY MOUTH DAILY BEFORE BREAKFAST. 30 tablet 0  . potassium chloride (KLOR-CON) 10 MEQ tablet Take 1 tablet (10 mEq total) by mouth daily. 90 tablet 1  . promethazine (PHENERGAN) 25 MG tablet Take 1 tablet (25 mg total) by mouth every 8 (eight) hours as needed for nausea or vomiting. 20 tablet 3  . rizatriptan (MAXALT) 10 MG tablet TAKE 1 TABLET BY MOUTH AT FIRST SIGN OF MIGRAINE SYMPTOMS. IF NO RELIEF, A SECOND TABLET MAY BE TAKEN IN 2 HOURS. LIMIT OF 2 TABLETS PER DAY. 30 tablet 11  . temazepam (RESTORIL) 15 MG capsule TAKE 1 TO 2 CAPSULES BY MOUTH AT BEDTIMEAS NEEDED FOR SLEEP 60 capsule 5  . valACYclovir (VALTREX) 500 MG tablet TAKE 1 TABLET BY MOUTH 2 TIMES DAILY AS NEEDED FOR FEVER BLISTERS 30 tablet 5   No current facility-administered medications on file prior to visit.      ROS:  Review of Systems  Constitutional: Negative for fatigue, fever and unexpected weight change.  Respiratory: Negative for cough, shortness of breath and wheezing.   Cardiovascular: Negative for  chest pain, palpitations and leg swelling.  Gastrointestinal: Negative for blood in stool, constipation, diarrhea, nausea and vomiting.  Endocrine: Negative for cold intolerance, heat intolerance and polyuria.  Genitourinary: Negative for dyspareunia, dysuria, flank pain, frequency, genital sores, hematuria, menstrual problem, pelvic pain, urgency, vaginal bleeding, vaginal discharge and vaginal pain.  Musculoskeletal: Negative for back pain, joint swelling and myalgias.  Skin: Negative for rash.  Neurological: Negative for dizziness, syncope, light-headedness, numbness and headaches.  Hematological: Negative for adenopathy.  Psychiatric/Behavioral: Negative for  agitation, confusion, sleep disturbance and suicidal ideas. The patient is not nervous/anxious.      Objective: BP 110/70   Ht $R'5\' 5"'Hv$  (1.651 m)   Wt 210 lb (95.3 kg)   BMI 34.95 kg/m    Physical Exam Constitutional:      Appearance: She is well-developed.  Genitourinary:     Vulva normal.     Right Labia: No rash, tenderness or lesions.    Left Labia: No tenderness, lesions or rash.    No vaginal discharge, erythema or tenderness.      Right Adnexa: not tender and no mass present.    Left Adnexa: not tender and no mass present.    No cervical friability or polyp.     Uterus is not enlarged or tender.  Breasts:     Right: Absent. No mass or skin change.     Left: No mass, nipple discharge, skin change or tenderness.    Neck:     Thyroid: No thyromegaly.  Cardiovascular:     Rate and Rhythm: Normal rate and regular rhythm.     Heart sounds: Normal heart sounds. No murmur heard.   Pulmonary:     Effort: Pulmonary effort is normal.     Breath sounds: Normal breath sounds.  Abdominal:     Palpations: Abdomen is soft.     Tenderness: There is no abdominal tenderness. There is no guarding or rebound.  Musculoskeletal:        General: Normal range of motion.     Cervical back: Normal range of motion.   Lymphadenopathy:     Cervical: No cervical adenopathy.  Neurological:     General: No focal deficit present.     Mental Status: She is alert and oriented to person, place, and time.     Cranial Nerves: No cranial nerve deficit.  Skin:    General: Skin is warm and dry.  Psychiatric:        Mood and Affect: Mood normal.        Behavior: Behavior normal.        Thought Content: Thought content normal.        Judgment: Judgment normal.  Vitals reviewed.     Assessment/Plan: Encounter for annual routine gynecological examination  Encounter for screening mammogram for malignant neoplasm of breast - Plan: MM 3D SCREEN BREAST UNI LEFT; pt to sched mammo  History of breast cancer--MyRisk update testing offered, pt declines for now. Will f/u if desires.   Vasomotor symptoms due to menopause--pt followed by NP in [facility]. Aware of risks of HRT and breast cancer hx.            GYN counsel breast self exam, mammography screening, adequate intake of calcium and vitamin D, diet and exercise     F/U  Return in about 1 year (around 04/17/2021).  Joaquin Knebel B. Donnice Nielsen, PA-C 04/17/2020 11:51 AM

## 2020-04-17 ENCOUNTER — Other Ambulatory Visit: Payer: Self-pay

## 2020-04-17 ENCOUNTER — Encounter: Payer: Self-pay | Admitting: Obstetrics and Gynecology

## 2020-04-17 ENCOUNTER — Ambulatory Visit (INDEPENDENT_AMBULATORY_CARE_PROVIDER_SITE_OTHER): Payer: Medicare Other | Admitting: Obstetrics and Gynecology

## 2020-04-17 VITALS — BP 110/70 | Ht 65.0 in | Wt 210.0 lb

## 2020-04-17 DIAGNOSIS — N951 Menopausal and female climacteric states: Secondary | ICD-10-CM | POA: Diagnosis not present

## 2020-04-17 DIAGNOSIS — Z853 Personal history of malignant neoplasm of breast: Secondary | ICD-10-CM

## 2020-04-17 DIAGNOSIS — Z1231 Encounter for screening mammogram for malignant neoplasm of breast: Secondary | ICD-10-CM

## 2020-04-17 DIAGNOSIS — Z01419 Encounter for gynecological examination (general) (routine) without abnormal findings: Secondary | ICD-10-CM | POA: Diagnosis not present

## 2020-04-17 NOTE — Patient Instructions (Signed)
I value your feedback and you entrusting us with your care. If you get a [facility] patient survey, I would appreciate you taking the time to let us know about your experience today. Thank you!  [facility]: [phone]      

## 2020-04-27 DIAGNOSIS — G4733 Obstructive sleep apnea (adult) (pediatric): Secondary | ICD-10-CM | POA: Diagnosis not present

## 2020-05-24 NOTE — Progress Notes (Deleted)
Subjective:   Sabrina Morris is a 55 y.o. female who presents for an Initial Medicare Annual Wellness Visit.  Review of Systems    N/A        Objective:    There were no vitals filed for this visit. There is no height or weight on file to calculate BMI.  Advanced Directives 10/11/2019 09/27/2019 11/25/2018 10/26/2018 08/15/2015 07/18/2015  Does Patient Have a Medical Advance Directive? No No No No No No  Would patient like information on creating a medical advance directive? - No - Patient declined No - Patient declined No - Patient declined - -    Current Medications (verified) Outpatient Encounter Medications as of 05/28/2020  Medication Sig  . butalbital-acetaminophen-caffeine (BAC) 50-325-40 MG tablet TAKE 1 TO 2 TABLETS BY MOUTH EVERY 6 HOURS AS NEEDED FOR HEADACHE  . calcium carbonate 100 mg/ml SUSP Take by mouth.  . Cholecalciferol (VITAMIN D3) 5000 units TABS Take by mouth.  . cyclobenzaprine (FLEXERIL) 5 MG tablet TAKE 1 TABLET BY MOUTH 3 TIMES A DAY AS NEEDED  . docusate sodium (COLACE) 100 MG capsule Take by mouth.  . fluocinonide (LIDEX) 0.05 % external solution Apply 1 application topically 2 (two) times daily.  . furosemide (LASIX) 20 MG tablet TAKE 2 TABLETS BY MOUTH EVERY DAY  . ibuprofen (ADVIL) 600 MG tablet TAKE 1 TABLET (600MG  TOTAL) BY MOUTH EVERY 8 (EIGHT) HOURS AS NEEDED.  Marland Kitchen LORazepam (ATIVAN) 1 MG tablet TAKE 1 TABLET BY MOUTH TWICE A DAY AS NEEDED  . meloxicam (MOBIC) 15 MG tablet Take 1 tablet (15 mg total) by mouth daily.  . Multiple Vitamin (MULTIVITAMIN) capsule Take by mouth.  . nystatin ointment (MYCOSTATIN) Apply topically.  Marland Kitchen oxyCODONE-acetaminophen (PERCOCET/ROXICET) 5-325 MG tablet TAKE 1 TO 2 TABLETS BY MOUTH EVERY 6 HOURS AS NEEDED FOR SEVERE PAIN  . phentermine (ADIPEX-P) 37.5 MG tablet TAKE 1/2 TO 1 TABLET BY MOUTH DAILY BEFORE BREAKFAST.  Marland Kitchen potassium chloride (KLOR-CON) 10 MEQ tablet Take 1 tablet (10 mEq total) by mouth daily.  .  promethazine (PHENERGAN) 25 MG tablet Take 1 tablet (25 mg total) by mouth every 8 (eight) hours as needed for nausea or vomiting.  . rizatriptan (MAXALT) 10 MG tablet TAKE 1 TABLET BY MOUTH AT FIRST SIGN OF MIGRAINE SYMPTOMS. IF NO RELIEF, A SECOND TABLET MAY BE TAKEN IN 2 HOURS. LIMIT OF 2 TABLETS PER DAY.  Marland Kitchen temazepam (RESTORIL) 15 MG capsule TAKE 1 TO 2 CAPSULES BY MOUTH AT BEDTIMEAS NEEDED FOR SLEEP  . valACYclovir (VALTREX) 500 MG tablet TAKE 1 TABLET BY MOUTH 2 TIMES DAILY AS NEEDED FOR FEVER BLISTERS   No facility-administered encounter medications on file as of 05/28/2020.    Allergies (verified) Sumatriptan, Feraheme [ferumoxytol], Haemophilus influenzae vaccines, Other, Phentermine-topiramate, Sulfa antibiotics, Sulfamethoxazole, Tamoxifen, Zoladex  [goserelin], Codeine, and Hydrocodone-acetaminophen   History: Past Medical History:  Diagnosis Date  . Adrenal disorder ([place])   . Anemia 11/2018  . Breast cancer ([place])   . H/O bone density study 2015  . H/O colonoscopy    sch 06/12/15  . H/O colonoscopy 11/2018  . H/O endoscopy 11/2018   upper  . Lymphedema   . Lymphedema   . Migraine   . Pap smear for cervical cancer screening [id number]   Past Surgical History:  Procedure Laterality Date  . breast  Left 02/18/2017   Implant removed  . BREAST ENHANCEMENT SURGERY Bilateral 2005  . CARDIAC CATHETERIZATION  1994  . CARDIAC CATHETERIZATION  1994  . MASTECTOMY  Right 02/2011  . PORT A CATH INJECTION ([place] HX)  2013   Family History  Adopted: Yes  Problem Relation Age of Onset  . Breast cancer Sister 27       1/2 sister; Genetic testing neg at [facility] History  . Marital status: Single    Spouse name: Not on file  . Number of children: Not on file  . Years of education: Not on file  . Highest education level: Not on file  Occupational History  . Occupation: Programmer, multimedia: [employer]    Comment: Full Time  Tobacco Use  .  Smoking status: Never Smoker  . Smokeless tobacco: Never Used  Vaping Use  . Vaping Use: Never used  Substance and Sexual Activity  . Alcohol use: No  . Drug use: No  . Sexual activity: Not Currently    Birth control/protection: Post-menopausal  Other Topics Concern  . 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  No smoking or alcohol. Lives at home/with sister.    Social Determinants of Health   Financial Resource Strain: Not on file  Food Insecurity: Not on file  Transportation Needs: Not on file  Physical Activity: Not on file  Stress: Not on file  Social Connections: Not on file    Tobacco Counseling Counseling given: Not Answered   Clinical Intake:                 Diabetic? No         Activities of Daily Living In your present state of health, do you have any difficulty performing the following activities: 12/14/2019  Hearing? N  Vision? N  Difficulty concentrating or making decisions? N  Walking or climbing stairs? N  Dressing or bathing? N  Doing errands, shopping? N  Some recent data might be hidden    Patient Care Team: Mar Daring, PA-C as PCP - General (Family Medicine) Ellwood Handler., MD (Internal Medicine)  Indicate any recent Medical Services you may have received from other than [facility] providers in the past year (date may be approximate).     Assessment:   This is a routine wellness examination for [place].  Hearing/Vision screen No exam data present  Dietary issues and exercise activities discussed:    Goals   None    Depression Screen PHQ 2/9 Scores 12/14/2019 10/03/2019 10/03/2019 12/21/2017 05/19/2016 04/20/2015  PHQ - 2 Score 0 0 0 0 0 0  PHQ- 9 Score 0 0 - - 2 -    Fall Risk Fall Risk  12/14/2019 11/16/2018 12/21/2017 04/20/2015  Falls in the past year? 0 0 No No  Number falls in past yr: 0 - - -  Injury with Fall? 0 - - -  Risk for  fall due to : No Fall Risks - - -  Follow up Falls evaluation completed - - -    FALL RISK PREVENTION PERTAINING TO THE HOME:  Any stairs in or around the home? {YES/NO:21197} If so, are there any without handrails? {YES/NO:21197} Home free of loose throw rugs in walkways, pet beds, electrical cords, etc? Yes  Adequate lighting in your home to reduce risk of falls? Yes   ASSISTIVE DEVICES UTILIZED TO PREVENT FALLS:  Life alert? {YES/NO:21197} Use of a cane, walker or w/c? {YES/NO:21197} Grab bars in the bathroom? Scientist, physiological  or bench in shower? {YES/NO:21197} Elevated toilet seat or a handicapped toilet? {YES/NO:21197}  TIMED UP AND GO:  Was the test performed? Yes .  Length of time to ambulate 10 feet: *** sec.   {Appearance of JJHE:1740814}  Cognitive Function: ***        Immunizations  There is no immunization history on file for this patient.  TDAP status: Due, Education has been provided regarding the importance of this vaccine. Advised may receive this vaccine at local pharmacy or Health Dept. Aware to provide a copy of the vaccination record if obtained from local pharmacy or Health Dept. Verbalized acceptance and understanding.  {Flu Vaccine status:2101806}  {Covid-19 vaccine status:2101808}  Qualifies for Shingles Vaccine? Yes   Zostavax completed No   Shingrix Completed?: No.    Education has been provided regarding the importance of this vaccine. Patient has been advised to call insurance company to determine out of pocket expense if they have not yet received this vaccine. Advised may also receive vaccine at local pharmacy or Health Dept. Verbalized acceptance and understanding.  Screening Tests Health Maintenance  Topic Date Due  . COVID-19 Vaccine (1) Never done  . TETANUS/TDAP  Never done  . INFLUENZA VACCINE  06/14/2020 (Originally 10/16/2019)  . PAP SMEAR-Modifier  05/29/2021  . MAMMOGRAM  09/29/2021  . COLONOSCOPY (Pts 45-25yrs  Insurance coverage will need to be confirmed)  12/01/2021  . HIV Screening  Completed  . HPV VACCINES  Aged Out    Health Maintenance  Health Maintenance Due  Topic Date Due  . COVID-19 Vaccine (1) Never done  . TETANUS/TDAP  Never done    Colorectal cancer screening: Type of screening: Colonoscopy. Completed 12/02/18. Repeat every 3 years  Mammogram status: Completed 09/30/19. Repeat every year  Bone Density status: Completed 04/02/20. Results reflect: Bone density results: OSTEOPENIA. Repeat every 5 years.  Lung Cancer Screening: (Low Dose CT Chest recommended if Age 75-80 years, 30 pack-year currently smoking OR have quit w/in 15years.) {DOES NOT does:27190::"does not"} qualify.   Lung Cancer Screening Referral: ***  Additional Screening:  Vision Screening: Recommended annual ophthalmology exams for early detection of glaucoma and other disorders of the eye. Is the patient up to date with their annual eye exam?  Yes  Who is the provider or what is the name of the office in which the patient attends annual eye exams? *** If pt is not established with a provider, would they like to be referred to a provider to establish care? No .   Dental Screening: Recommended annual dental exams for proper oral hygiene  Community Resource Referral / Chronic Care Management: CRR required this visit?  No   CCM required this visit?  No      Plan:     I have personally reviewed and noted the following in the patient's chart:   . Medical and social history . Use of alcohol, tobacco or illicit drugs  . Current medications and supplements . Functional ability and status . Nutritional status . Physical activity . Advanced directives . List of other physicians . Hospitalizations, surgeries, and ER visits in previous 12 months . Vitals . Screenings to include cognitive, depression, and falls . Referrals and appointments  In addition, I have reviewed and discussed with patient certain  preventive protocols, quality metrics, and best practice recommendations. A written personalized care plan for preventive services as well as general preventive health recommendations were provided to patient.     Lani Mendiola [place], [place]   [id number]  Nurse Notes: ***

## 2020-05-28 ENCOUNTER — Ambulatory Visit: Payer: Medicare Other

## 2020-06-05 DIAGNOSIS — G4733 Obstructive sleep apnea (adult) (pediatric): Secondary | ICD-10-CM | POA: Diagnosis not present

## 2020-06-13 ENCOUNTER — Inpatient Hospital Stay: Payer: Medicare Other

## 2020-06-14 ENCOUNTER — Inpatient Hospital Stay: Payer: Medicare Other | Attending: Internal Medicine

## 2020-06-14 ENCOUNTER — Other Ambulatory Visit: Payer: Medicare Other

## 2020-06-14 ENCOUNTER — Ambulatory Visit: Payer: Medicare Other | Admitting: Internal Medicine

## 2020-06-14 DIAGNOSIS — D500 Iron deficiency anemia secondary to blood loss (chronic): Secondary | ICD-10-CM | POA: Diagnosis not present

## 2020-06-14 DIAGNOSIS — D751 Secondary polycythemia: Secondary | ICD-10-CM | POA: Insufficient documentation

## 2020-06-14 LAB — BASIC METABOLIC PANEL
Anion gap: 11 (ref 5–15)
BUN: 24 mg/dL — ABNORMAL HIGH (ref 6–20)
CO2: 23 mmol/L (ref 22–32)
Calcium: 8.8 mg/dL — ABNORMAL LOW (ref 8.9–10.3)
Chloride: 102 mmol/L (ref 98–111)
Creatinine, Ser: 0.79 mg/dL (ref 0.44–1.00)
GFR, Estimated: >60 mL/min (ref >60)
Glucose, Bld: 100 mg/dL — ABNORMAL HIGH (ref 70–99)
Potassium: 3.8 mmol/L (ref 3.5–5.1)
Sodium: 136 mmol/L (ref 135–145)

## 2020-06-14 LAB — IRON AND TIBC
Iron: 110 ug/dL (ref 28–170)
Saturation Ratios: 27 % (ref 10.4–31.8)
TIBC: 406 ug/dL (ref 250–450)
UIBC: 296 ug/dL

## 2020-06-14 LAB — CBC WITH DIFFERENTIAL/PLATELET
Abs Immature Granulocytes: 0.01 10*3/uL (ref 0.00–0.07)
Basophils Absolute: 0 10*3/uL (ref 0.0–0.1)
Basophils Relative: 1 %
Eosinophils Absolute: 0.2 10*3/uL (ref 0.0–0.5)
Eosinophils Relative: 3 %
HCT: 44.7 % (ref 36.0–46.0)
Hemoglobin: 15.3 g/dL — ABNORMAL HIGH (ref 12.0–15.0)
Immature Granulocytes: 0 %
Lymphocytes Relative: 27 %
Lymphs Abs: 1.7 10*3/uL (ref 0.7–4.0)
MCH: 30.8 pg (ref 26.0–34.0)
MCHC: 34.2 g/dL (ref 30.0–36.0)
MCV: 89.9 fL (ref 80.0–100.0)
Monocytes Absolute: 0.5 10*3/uL (ref 0.1–1.0)
Monocytes Relative: 9 %
Neutro Abs: 3.7 10*3/uL (ref 1.7–7.7)
Neutrophils Relative %: 60 %
Platelets: 249 10*3/uL (ref 150–400)
RBC: 4.97 MIL/uL (ref 3.87–5.11)
RDW: 12.4 % (ref 11.5–15.5)
WBC: 6.2 10*3/uL (ref 4.0–10.5)
nRBC: 0 % (ref 0.0–0.2)

## 2020-06-14 LAB — FERRITIN: Ferritin: 62 ng/mL (ref 11–307)

## 2020-06-14 NOTE — Progress Notes (Signed)
Subjective:   Sabrina Morris is a 55 y.o. female who presents for an Initial Medicare Annual Wellness Visit.  I connected with Jarold Song today by telephone and verified that I am speaking with the correct person using two identifiers. Location patient: home Location provider: work Persons participating in the virtual visit: patient, provider.   I discussed the limitations, risks, security and privacy concerns of performing an evaluation and management service by telephone and the availability of in person appointments. I also discussed with the patient that there may be a patient responsible charge related to this service. The patient expressed understanding and verbally consented to this telephonic visit.    Interactive audio and video telecommunications were attempted between this provider and patient, however failed, due to patient having technical difficulties OR patient did not have access to video capability.  We continued and completed visit with audio only.   Review of Systems    N/A  Cardiac Risk Factors include: advanced age (>68men, >69 women)     Objective:    Today's Vitals   06/18/20 1446  PainSc: 4    There is no height or weight on file to calculate BMI.  Advanced Directives 06/18/2020 06/15/2020 10/11/2019 09/27/2019 11/25/2018 10/26/2018 08/15/2015  Does Patient Have a Medical Advance Directive? No No No No No No No  Would patient like information on creating a medical advance directive? No - Patient declined No - Patient declined - No - Patient declined No - Patient declined No - Patient declined -    Current Medications (verified) Outpatient Encounter Medications as of 06/18/2020  Medication Sig  . butalbital-acetaminophen-caffeine (BAC) 50-325-40 MG tablet TAKE 1 TO 2 TABLETS BY MOUTH EVERY 6 HOURS AS NEEDED FOR HEADACHE  . Calcium-Magnesium-Vitamin D [phone] MG-MG-UNIT TABS Take by mouth. Alternates between 1 tablet once daily and two tablets daily.  .  Cholecalciferol (VITAMIN D3) 5000 units TABS Take by mouth daily at 6 (six) AM.  . cyclobenzaprine (FLEXERIL) 5 MG tablet TAKE 1 TABLET BY MOUTH 3 TIMES A DAY AS NEEDED  . docusate sodium (COLACE) 100 MG capsule Take by mouth daily as needed.  . fluocinonide (LIDEX) 0.05 % external solution Apply 1 application topically 2 (two) times daily.  . furosemide (LASIX) 20 MG tablet TAKE 2 TABLETS BY MOUTH EVERY DAY  . ibuprofen (ADVIL) 600 MG tablet TAKE 1 TABLET (600MG  TOTAL) BY MOUTH EVERY 8 (EIGHT) HOURS AS NEEDED.  Marland Kitchen LORazepam (ATIVAN) 1 MG tablet TAKE 1 TABLET BY MOUTH TWICE A DAY AS NEEDED  . meloxicam (MOBIC) 15 MG tablet Take 1 tablet (15 mg total) by mouth daily.  . Multiple Vitamin (MULTIVITAMIN) capsule Take 1 capsule by mouth daily.  Marland Kitchen nystatin ointment (MYCOSTATIN) Apply 1 application topically 2 (two) times daily. At mastectomy site as needed  . oxyCODONE-acetaminophen (PERCOCET/ROXICET) 5-325 MG tablet TAKE 1 TO 2 TABLETS BY MOUTH EVERY 6 HOURS AS NEEDED FOR SEVERE PAIN  . phentermine (ADIPEX-P) 37.5 MG tablet TAKE 1/2 TO 1 TABLET BY MOUTH DAILY BEFORE BREAKFAST.  Marland Kitchen potassium chloride (KLOR-CON) 10 MEQ tablet Take 1 tablet (10 mEq total) by mouth daily.  . promethazine (PHENERGAN) 25 MG tablet Take 1 tablet (25 mg total) by mouth every 8 (eight) hours as needed for nausea or vomiting.  . rizatriptan (MAXALT) 10 MG tablet TAKE 1 TABLET BY MOUTH AT FIRST SIGN OF MIGRAINE SYMPTOMS. IF NO RELIEF, A SECOND TABLET MAY BE TAKEN IN 2 HOURS. LIMIT OF 2 TABLETS PER DAY.  Marland Kitchen temazepam (  RESTORIL) 15 MG capsule TAKE 1 TO 2 CAPSULES BY MOUTH AT BEDTIMEAS NEEDED FOR SLEEP  . valACYclovir (VALTREX) 500 MG tablet TAKE 1 TABLET BY MOUTH 2 TIMES DAILY AS NEEDED FOR FEVER BLISTERS  . calcium carbonate 100 mg/ml SUSP Take by mouth. (Patient not taking: Reported on 06/18/2020)   No facility-administered encounter medications on file as of 06/18/2020.    Allergies (verified) Sumatriptan, Feraheme [ferumoxytol],  Haemophilus influenzae vaccines, Other, Phentermine-topiramate, Sulfa antibiotics, Sulfamethoxazole, Tamoxifen, Zoladex  [goserelin], Codeine, and Hydrocodone-acetaminophen   History: Past Medical History:  Diagnosis Date  . Adrenal disorder ([place])   . Anemia 11/2018  . Breast cancer ([place])   . H/O bone density study 2015  . H/O colonoscopy    sch 06/12/15  . H/O colonoscopy 11/2018  . H/O endoscopy 11/2018   upper  . Lymphedema   . Lymphedema   . Migraine   . Pap smear for cervical cancer screening [id number]   Past Surgical History:  Procedure Laterality Date  . breast  Left 02/18/2017   Implant removed  . BREAST ENHANCEMENT SURGERY Bilateral 2005  . CARDIAC CATHETERIZATION  1994  . CARDIAC CATHETERIZATION  1994  . MASTECTOMY Right 02/2011  . PORT A CATH INJECTION ([place] [facility])  2013   Family History  Adopted: Yes  Problem Relation Age of Onset  . Breast cancer Sister 60       1/2 sister; Genetic testing neg at [facility] History  . Marital status: Divorced    Spouse name: Not on file  . Number of children: 0  . Years of education: Not on file  . Highest education level: Associate degree: occupational, Hotel manager, or vocational program  Occupational History  . Occupation: Therapist, sports    Comment: part time  . Occupation: disability  Tobacco Use  . Smoking status: Never Smoker  . Smokeless tobacco: Never Used  Vaping Use  . Vaping Use: Never used  Substance and Sexual Activity  . Alcohol use: No  . Drug use: No  . Sexual activity: Not Currently    Birth control/protection: Post-menopausal  Other Topics Concern  . 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  No smoking or alcohol. Lives at home/with sister.    Social Determinants of Health   Financial Resource Strain: Medium Risk  . Difficulty of Paying Living Expenses: Somewhat hard  Food Insecurity: No Food  Insecurity  . Worried About Charity fundraiser in the Last Year: Never true  . Ran Out of Food in the Last Year: Never true  Transportation Needs: No Transportation Needs  . Lack of Transportation (Medical): No  . Lack of Transportation (Non-Medical): No  Physical Activity: Inactive  . Days of Exercise per Week: 0 days  . Minutes of Exercise per Session: 0 min  Stress: Stress Concern Present  . Feeling of Stress : Rather much  Social Connections: Socially Isolated  . Frequency of Communication with Friends and Family: More than three times a week  . Frequency of Social Gatherings with Friends and Family: Three times a week  . Attends Religious Services: Never  . Active Member of Clubs or Organizations: No  . Attends Archivist Meetings: Never  . Marital Status: Divorced    Tobacco Counseling Counseling given: Not Answered   Clinical Intake:  Pre-visit preparation completed: Yes  Pain : 0-10 Pain Score: 4  Pain Type: Chronic pain Pain Location: Back Pain Orientation: Other (Comment) (all over) Pain Descriptors / Indicators: Aching Pain Frequency: Constant Pain Relieving Factors: Taking Ibuprofen, Percocet or Meloxicam as needed for pain.  Pain Relieving Factors: Taking Ibuprofen, Percocet or Meloxicam as needed for pain.  Nutritional Risks: None Diabetes: No  How often do you need to have someone help you when you read instructions, pamphlets, or other written materials from your doctor or pharmacy?: 1 - Never  Diabetic? No  Interpreter Needed?: No  Information entered by :: [facility], LPN   Activities of Daily Living In your present state of health, do you have any difficulty performing the following activities: 06/18/2020 12/14/2019  Hearing? N N  Vision? N N  Difficulty concentrating or making decisions? N N  Walking or climbing stairs? Y N  Comment Due to SOB. -  Dressing or bathing? N N  Doing errands, shopping? N N  Preparing Food and eating ?  N -  Using the Toilet? N -  In the past six months, have you accidently leaked urine? N -  Do you have problems with loss of bowel control? N -  Managing your Medications? N -  Managing your Finances? N -  Housekeeping or managing your Housekeeping? N -  Some recent data might be hidden    Patient Care Team: Mar Daring, PA-C as PCP - General (Family Medicine) Ellwood Handler., MD (Gastroenterology) [place], [place] any recent Medical Services you may have received from other than [facility] providers in the past year (date may be approximate).     Assessment:   This is a routine wellness examination for Sabrina Morris.  Hearing/Vision screen No exam data present  Dietary issues and exercise activities discussed: Current Exercise Habits: Home exercise routine, Type of exercise: yoga;stretching, Time (Minutes): 30, Frequency (Times/Week): 3, Weekly Exercise (Minutes/Week): 90, Intensity: Mild  Goals    . Prevent falls     Recommend to remove any items from the home that may cause slips or trips.      Depression Screen PHQ 2/9 Scores 06/18/2020 12/14/2019 10/03/2019 10/03/2019 12/21/2017 05/19/2016 04/20/2015  PHQ - 2 Score 0 0 0 0 0 0 0  PHQ- 9 Score - 0 0 - - 2 -    Fall Risk Fall Risk  06/18/2020 12/14/2019 11/16/2018 12/21/2017 04/20/2015  Falls in the past year? 1 0 0 No No  Number falls in past yr: 1 0 - - -  Injury with Fall? 0 0 - - -  Risk for fall due to : Impaired balance/gait No Fall Risks - - -  Follow up Falls prevention discussed Falls evaluation completed - - -    FALL RISK PREVENTION PERTAINING TO THE HOME:  Any stairs in or around the home? Yes  If so, are there any without handrails? No  Home free of loose throw rugs in walkways, pet beds, electrical cords, etc? Yes  Adequate lighting in your home to reduce risk of falls? Yes   ASSISTIVE DEVICES UTILIZED TO PREVENT FALLS:  Life alert? No  Use of a cane, walker or w/c? No  Grab bars in the  bathroom? No  Shower chair or bench in shower? No  Elevated toilet seat or a handicapped toilet? No    Cognitive Function: Normal cognitive status assessed by observation by this Nurse Health Advisor. No abnormalities found.          Immunizations  There is no immunization history on file  for this patient.  TDAP status: Due, Education has been provided regarding the importance of this vaccine. Advised may receive this vaccine at local pharmacy or Health Dept. Aware to provide a copy of the vaccination record if obtained from local pharmacy or Health Dept. Verbalized acceptance and understanding.  Flu Vaccine status: Declined, Education has been provided regarding the importance of this vaccine but patient still declined. Advised may receive this vaccine at local pharmacy or Health Dept. Aware to provide a copy of the vaccination record if obtained from local pharmacy or Health Dept. Verbalized acceptance and understanding.  Covid-19 vaccine status: Declined, Education has been provided regarding the importance of this vaccine but patient still declined. Advised may receive this vaccine at local pharmacy or Health Dept.or vaccine clinic. Aware to provide a copy of the vaccination record if obtained from local pharmacy or Health Dept. Verbalized acceptance and understanding.  Qualifies for Shingles Vaccine? Yes   Zostavax completed No   Shingrix Completed?: No.    Education has been provided regarding the importance of this vaccine. Patient has been advised to call insurance company to determine out of pocket expense if they have not yet received this vaccine. Advised may also receive vaccine at local pharmacy or Health Dept. Verbalized acceptance and understanding.  Screening Tests Health Maintenance  Topic Date Due  . COVID-19 Vaccine (1) 07/04/2020 (Originally 12/20/1977)  . TETANUS/TDAP  06/18/2021 (Originally 12/21/1983)  . INFLUENZA VACCINE  10/15/2020  . PAP SMEAR-Modifier   05/29/2021  . MAMMOGRAM  09/29/2021  . COLONOSCOPY (Pts 45-94yrs Insurance coverage will need to be confirmed)  12/01/2021  . HIV Screening  Completed  . HPV VACCINES  Aged Out    Health Maintenance  There are no preventive care reminders to display for this patient.  Colorectal cancer screening: Type of screening: Colonoscopy. Completed 12/02/18. Repeat every 3 years  Mammogram status: Completed 09/30/19. Repeat every year  Bone Density status: Completed 04/02/20. Results reflect: Bone density results: OSTEOPENIA. Repeat every 5 years.  Lung Cancer Screening: (Low Dose CT Chest recommended if Age 77-80 years, 30 pack-year currently smoking OR have quit w/in 15years.) does not qualify.   Additional Screening:  Vision Screening: Recommended annual ophthalmology exams for early detection of glaucoma and other disorders of the eye. Is the patient up to date with their annual eye exam?  Yes  Who is the provider or what is the name of the office in which the patient attends annual eye exams? [facility] If pt is not established with a provider, would they like to be referred to a provider to establish care? No .   Dental Screening: Recommended annual dental exams for proper oral hygiene  Community Resource Referral / Chronic Care Management: CRR required this visit?  No   CCM required this visit?  No      Plan:     I have personally reviewed and noted the following in the patient's chart:   . Medical and social history . Use of alcohol, tobacco or illicit drugs  . Current medications and supplements . Functional ability and status . Nutritional status . Physical activity . Advanced directives . List of other physicians . Hospitalizations, surgeries, and ER visits in previous 12 months . Vitals . Screenings to include cognitive, depression, and falls . Referrals and appointments  In addition, I have reviewed and discussed with patient certain preventive protocols,  quality metrics, and best practice recommendations. A written personalized care plan for preventive services as well as  general preventive health recommendations were provided to patient.     Jaliyah Fotheringham [place], [place]   07/17/6920   Nurse Notes: Pt declined receiving a future flu or Covid vaccine.

## 2020-06-15 ENCOUNTER — Other Ambulatory Visit: Payer: Medicare Other

## 2020-06-15 ENCOUNTER — Inpatient Hospital Stay: Payer: Medicare Other | Attending: Internal Medicine | Admitting: Internal Medicine

## 2020-06-15 ENCOUNTER — Other Ambulatory Visit: Payer: Self-pay

## 2020-06-15 ENCOUNTER — Encounter: Payer: Self-pay | Admitting: Internal Medicine

## 2020-06-15 DIAGNOSIS — Z79899 Other long term (current) drug therapy: Secondary | ICD-10-CM | POA: Diagnosis not present

## 2020-06-15 DIAGNOSIS — Z803 Family history of malignant neoplasm of breast: Secondary | ICD-10-CM | POA: Insufficient documentation

## 2020-06-15 DIAGNOSIS — D500 Iron deficiency anemia secondary to blood loss (chronic): Secondary | ICD-10-CM

## 2020-06-15 DIAGNOSIS — G4730 Sleep apnea, unspecified: Secondary | ICD-10-CM | POA: Insufficient documentation

## 2020-06-15 DIAGNOSIS — R5383 Other fatigue: Secondary | ICD-10-CM | POA: Diagnosis not present

## 2020-06-15 DIAGNOSIS — Z887 Allergy status to serum and vaccine status: Secondary | ICD-10-CM | POA: Insufficient documentation

## 2020-06-15 DIAGNOSIS — Z9011 Acquired absence of right breast and nipple: Secondary | ICD-10-CM | POA: Diagnosis not present

## 2020-06-15 DIAGNOSIS — I890 Lymphedema, not elsewhere classified: Secondary | ICD-10-CM | POA: Insufficient documentation

## 2020-06-15 DIAGNOSIS — D751 Secondary polycythemia: Secondary | ICD-10-CM | POA: Diagnosis not present

## 2020-06-15 DIAGNOSIS — M2550 Pain in unspecified joint: Secondary | ICD-10-CM | POA: Diagnosis not present

## 2020-06-15 DIAGNOSIS — M549 Dorsalgia, unspecified: Secondary | ICD-10-CM | POA: Insufficient documentation

## 2020-06-15 DIAGNOSIS — D509 Iron deficiency anemia, unspecified: Secondary | ICD-10-CM | POA: Diagnosis not present

## 2020-06-15 DIAGNOSIS — Z853 Personal history of malignant neoplasm of breast: Secondary | ICD-10-CM | POA: Diagnosis not present

## 2020-06-15 DIAGNOSIS — Z882 Allergy status to sulfonamides status: Secondary | ICD-10-CM | POA: Diagnosis not present

## 2020-06-15 DIAGNOSIS — Z885 Allergy status to narcotic agent status: Secondary | ICD-10-CM | POA: Insufficient documentation

## 2020-06-15 NOTE — Progress Notes (Signed)
Patient [place] NOTE  Patient Care Team: Mar Daring, PA-C as PCP - General (Family Medicine) Ellwood Handler., MD (Internal Medicine)  CHIEF COMPLAINTS/PURPOSE OF CONSULTATION: Anemia   HEMATOLOGY HISTORY  # Iron deficiency anemia -AUG 3rd 2020-hemoglobin 7.4 MCV 67; ferritin 3/iron saturation 3% [ EGD/colonoscopy- 2017 [screening;[facility]; Dr.Buccini; [place];capsule-none]. SEP 2020- CT abdomen pelvis-large hiatal hernia.  Feraheme-intolerance; OCT 2020-[facility]- [facility] [scopes/capsule]-  #Hiatal hernia status post surgery April 2021 [facility]; anemia resolved  # July 2021-erythrocytosis hemoglobin 16  #Right breast cancer ER PR positive HER-2 negative; T3N1 at 45 right s/p mastectomy N-1/20 LN- ALND s/p RT; TAC x 6 cycles [Dr.Marcom/Dr.Gudena]  # Lymphedema [Dr.Malone]  Oncology History  History of breast cancer  01/01/2011 Initial Diagnosis   Patient presented with palpable lump in the right breast a dumbbell-shaped tumor was identified spanning 7 cm   02/24/2011 Surgery   Right mastectomy revealed a dumbbell-shaped mass 3.5 cm and 2.5 cm with a total span of 7 cm 1/20 lymph nodes were positive ER/PR positive HER-2 negative   05/07/2011 - 09/04/2011 Chemotherapy   Adjuvant chemotherapy with Taxol, Adriamycin, Cytoxan every 3 weeks 6 cycles at [facility] ([email or web address])   09/19/2011 - 10/29/2011 Radiation Therapy   Adjuvant radiation therapy 30 fractions including right axilla   12/01/2011 - 04/01/2013 Anti-estrogen oral therapy   Adjuvant tamoxifen was started reluctantly, Zoladex was added January 2014 and antiestrogen therapy was discontinued January 2015 (profound lymphedema and anasarca )      HISTORY OF PRESENTING ILLNESS:  Sabrina Morris 55 y.o.  female with prior history of above breast cancer; and Iron deficiency anemia-is here for follow-up.  Patient denies any new onset of pain.  Chronic mild fatigue.    Continues to struggle with the  lymphedema-especially on the face while sleep apnea machine.  Review of Systems  Constitutional: Positive for malaise/fatigue. Negative for chills, diaphoresis and fever.  HENT: Negative for nosebleeds and sore throat.   Eyes: Negative for double vision.  Respiratory: Negative for cough, hemoptysis, sputum production and wheezing.   Cardiovascular: Negative for chest pain, palpitations, orthopnea and leg swelling.  Gastrointestinal: Negative for abdominal pain, blood in stool, constipation, diarrhea, heartburn, melena, nausea and vomiting.  Genitourinary: Negative for dysuria, frequency and urgency.  Musculoskeletal: Positive for back pain and joint pain.  Skin: Negative.  Negative for itching and rash.  Neurological: Negative for dizziness, tingling, focal weakness, weakness and headaches.  Endo/Heme/Allergies: Does not bruise/bleed easily.  Psychiatric/Behavioral: Negative for depression. The patient is not nervous/anxious and does not have insomnia.     MEDICAL HISTORY:  Past Medical History:  Diagnosis Date  . Adrenal disorder ([place])   . Anemia 11/2018  . Breast cancer ([place])   . H/O bone density study 2015  . H/O colonoscopy    sch 06/12/15  . H/O colonoscopy 11/2018  . H/O endoscopy 11/2018   upper  . Lymphedema   . Lymphedema   . Migraine   . Pap smear for cervical cancer screening [id number]    SURGICAL HISTORY: Past Surgical History:  Procedure Laterality Date  . breast  Left 02/18/2017   Implant removed  . BREAST ENHANCEMENT SURGERY Bilateral 2005  . CARDIAC CATHETERIZATION  1994  . CARDIAC CATHETERIZATION  1994  . MASTECTOMY Right 02/2011  . PORT A CATH INJECTION ([place] [facility])  2013    SOCIAL HISTORY: Social History   Socioeconomic History  . Marital status: Single    Spouse name: Not on file  .  Number of children: Not on file  . Years of education: Not on file  . Highest education level: Not on file  Occupational History  . Occupation: Programmer, multimedia:  [employer]    Comment: Full Time  Tobacco Use  . Smoking status: Never Smoker  . Smokeless tobacco: Never Used  Vaping Use  . Vaping Use: Never used  Substance and Sexual Activity  . Alcohol use: No  . Drug use: No  . Sexual activity: Not Currently    Birth control/protection: Post-menopausal  Other Topics Concern  . 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  No smoking or alcohol. Lives at home/with sister.    Social Determinants of Health   Financial Resource Strain: Not on file  Food Insecurity: Not on file  Transportation Needs: Not on file  Physical Activity: Not on file  Stress: Not on file  Social Connections: Not on file  Intimate Partner Violence: Not on file    FAMILY HISTORY: Family History  Adopted: Yes  Problem Relation Age of Onset  . Breast cancer Sister 110       1/2 sister; Genetic testing neg at [facility]    ALLERGIES:  is allergic to sumatriptan, feraheme [ferumoxytol], haemophilus influenzae vaccines, other, phentermine-topiramate, sulfa antibiotics, sulfamethoxazole, tamoxifen, zoladex  [goserelin], codeine, and hydrocodone-acetaminophen.  MEDICATIONS:  Current Outpatient Medications  Medication Sig Dispense Refill  . butalbital-acetaminophen-caffeine (BAC) 50-325-40 MG tablet TAKE 1 TO 2 TABLETS BY MOUTH EVERY 6 HOURS AS NEEDED FOR HEADACHE 60 tablet 5  . calcium carbonate 100 mg/ml SUSP Take by mouth.    . Cholecalciferol (VITAMIN D3) 5000 units TABS Take by mouth.    . cyclobenzaprine (FLEXERIL) 5 MG tablet TAKE 1 TABLET BY MOUTH 3 TIMES A DAY AS NEEDED 90 tablet 1  . docusate sodium (COLACE) 100 MG capsule Take by mouth.    . fluocinonide (LIDEX) 0.05 % external solution Apply 1 application topically 2 (two) times daily. 60 mL 3  . furosemide (LASIX) 20 MG tablet TAKE 2 TABLETS BY MOUTH EVERY DAY 60 tablet 5  . ibuprofen (ADVIL) 600 MG tablet TAKE 1  TABLET ($Remove'600MG'[id number]$  TOTAL) BY MOUTH EVERY 8 (EIGHT) HOURS AS NEEDED. 360 tablet 1  . LORazepam (ATIVAN) 1 MG tablet TAKE 1 TABLET BY MOUTH TWICE A DAY AS NEEDED 60 tablet 5  . meloxicam (MOBIC) 15 MG tablet Take 1 tablet (15 mg total) by mouth daily. 30 tablet 3  . Multiple Vitamin (MULTIVITAMIN) capsule Take by mouth.    . nystatin ointment (MYCOSTATIN) Apply topically.    Marland Kitchen oxyCODONE-acetaminophen (PERCOCET/ROXICET) 5-325 MG tablet TAKE 1 TO 2 TABLETS BY MOUTH EVERY 6 HOURS AS NEEDED FOR SEVERE PAIN 30 tablet 0  . phentermine (ADIPEX-P) 37.5 MG tablet TAKE 1/2 TO 1 TABLET BY MOUTH DAILY BEFORE BREAKFAST. 30 tablet 0  . potassium chloride (KLOR-CON) 10 MEQ tablet Take 1 tablet (10 mEq total) by mouth daily. 90 tablet 1  . promethazine (PHENERGAN) 25 MG tablet Take 1 tablet (25 mg total) by mouth every 8 (eight) hours as needed for nausea or vomiting. 20 tablet 3  . rizatriptan (MAXALT) 10 MG tablet TAKE 1 TABLET BY MOUTH AT FIRST SIGN OF MIGRAINE SYMPTOMS. IF NO RELIEF, A SECOND TABLET MAY BE TAKEN IN 2 HOURS. LIMIT OF 2 TABLETS PER DAY. 30 tablet 11  . temazepam (RESTORIL) 15 MG capsule TAKE 1  TO 2 CAPSULES BY MOUTH AT BEDTIMEAS NEEDED FOR SLEEP 60 capsule 5  . valACYclovir (VALTREX) 500 MG tablet TAKE 1 TABLET BY MOUTH 2 TIMES DAILY AS NEEDED FOR FEVER BLISTERS 30 tablet 5   No current facility-administered medications for this visit.      PHYSICAL EXAMINATION:   Vitals:   06/15/20 1018  BP: 133/67  Pulse: 83  Resp: 20  Temp: (!) 96.8 F (36 C)   Filed Weights   06/15/20 1018  Weight: 212 lb 3.2 oz (96.3 kg)    Physical Exam HENT:     Head: Normocephalic and atraumatic.     Mouth/Throat:     Pharynx: No oropharyngeal exudate.  Eyes:     Pupils: Pupils are equal, round, and reactive to light.  Cardiovascular:     Rate and Rhythm: Normal rate and regular rhythm.  Pulmonary:     Effort: Pulmonary effort is normal. No respiratory distress.     Breath sounds: Normal breath  sounds. No wheezing.  Abdominal:     General: Bowel sounds are normal. There is no distension.     Palpations: Abdomen is soft. There is no mass.     Tenderness: There is no abdominal tenderness. There is no guarding or rebound.  Musculoskeletal:        General: No tenderness. Normal range of motion.     Cervical back: Normal range of motion and neck supple.  Skin:    General: Skin is warm.  Neurological:     Mental Status: She is alert and oriented to person, place, and time.  Psychiatric:        Mood and Affect: Affect normal.     LABORATORY DATA:  I have reviewed the data as listed Lab Results  Component Value Date   WBC 6.2 06/14/2020   HGB 15.3 (H) 06/14/2020   HCT 44.7 06/14/2020   MCV 89.9 06/14/2020   PLT 249 06/14/2020   Recent Labs    10/03/19 0835 02/17/20 1348 06/14/20 1253  NA 138 141 136  K 4.2 4.3 3.8  CL 102 97 102  CO2 _0 GLUCOSE 97 96 100*  BUN 15 19 24*  CREATININE 0.92 0.82 0.79  CALCIUM 9.3 9.6 8.8*  GFRNONAA 71 81 >60  GFRAA 82 93  --   PROT 7.2  --   --   ALBUMIN 4.7  --   --   AST 18  --   --   ALT 29  --   --   ALKPHOS 109  --   --   BILITOT 0.4  --   --      No results found.  Iron deficiency anemia due to chronic blood loss # History Severe iron deficient anemia-question blood loss appears chronic MCV- resolved s/p since fundoplication. Today hemoglobin is 15.3; STABLE;   # Erythrocytosis secondary- ? Diuretics vs. sleep apnea- JAK-2/exon -12- NEG.   # Lymphedema chest wall-stable sec to breast surgery/? Surgery- on PT; on lasix.   # History of breast cancer-stable on surveillance; STABLE; clinically no evidence of recurrence. [followed by Dr.Gudena];   # DISPOSITION: # HOLD IV Iron today # follow up in 6 months; MD; labs- cbc/cmp;iron studies/ferritin; possible venofer-Dr.B  Cc; Dr.Gudena   All questions were answered. The patient knows to call the clinic with any problems, questions or concerns.    Cammie Sickle, MD 06/15/2020 12:29 PM

## 2020-06-15 NOTE — Assessment & Plan Note (Addendum)
#   History Severe iron deficient anemia-question blood loss appears chronic MCV- resolved s/p since fundoplication. Today hemoglobin is 15.3; STABLE;   # Erythrocytosis secondary- ? Diuretics vs. sleep apnea- JAK-2/exon -12- NEG.   # Lymphedema chest wall-stable sec to breast surgery/? Surgery- on PT; on lasix.   # History of breast cancer-stable on surveillance; STABLE; clinically no evidence of recurrence. [followed by Dr.Gudena];   # DISPOSITION: # HOLD IV Iron today # follow up in 6 months; MD; labs- cbc/cmp;iron studies/ferritin; possible venofer-Dr.B  Cc; Dr.Gudena

## 2020-06-18 ENCOUNTER — Other Ambulatory Visit: Payer: Self-pay

## 2020-06-18 ENCOUNTER — Ambulatory Visit (INDEPENDENT_AMBULATORY_CARE_PROVIDER_SITE_OTHER): Payer: Medicare Other

## 2020-06-18 DIAGNOSIS — Z0000 Encounter for general adult medical examination without abnormal findings: Secondary | ICD-10-CM | POA: Diagnosis not present

## 2020-06-18 NOTE — Patient Instructions (Signed)
[name] , Thank you for taking time to come for your Medicare Wellness Visit. I appreciate your ongoing commitment to your health goals. Please review the following plan we discussed and let me know if I can assist you in the future.   Screening recommendations/referrals: Colonoscopy: Up to date, due 11/2021 Mammogram: Up to date, due 09/2020 Bone Density: Up to date, due 03/2025 Recommended yearly ophthalmology/optometry visit for glaucoma screening and checkup Recommended yearly dental visit for hygiene and checkup  Vaccinations: Influenza vaccine: Currently due, declined receiving. Tdap vaccine: Currently due, declined receiving.  Shingles vaccine: Shingrix discussed. Please contact your pharmacy for coverage information.     Advanced directives: Advance directive discussed with you today. Even though you declined this today please call our office should you change your mind and we can give you the proper paperwork for you to fill out.  Conditions/risks identified: Fall risk preventatives discussed today.   Next appointment: None, declined scheduling a follow up with PCP or an AWV for 2023 at this time.   Preventive Care 40-64 Years, Female Preventive care refers to lifestyle choices and visits with your health care provider that can promote health and wellness. What does preventive care include?  A yearly physical exam. This is also called an annual well check.  Dental exams once or twice a year.  Routine eye exams. Ask your health care provider how often you should have your eyes checked.  Personal lifestyle choices, including:  Daily care of your teeth and gums.  Regular physical activity.  Eating a healthy diet.  Avoiding tobacco and drug use.  Limiting alcohol use.  Practicing safe sex.  Taking low-dose aspirin daily starting at age 45.  Taking vitamin and mineral supplements as recommended by your health care provider. What happens during an annual well  check? The services and screenings done by your health care provider during your annual well check will depend on your age, overall health, lifestyle risk factors, and family history of disease. Counseling  Your health care provider may ask you questions about your:  Alcohol use.  Tobacco use.  Drug use.  Emotional well-being.  Home and relationship well-being.  Sexual activity.  Eating habits.  Work and work Statistician.  Method of birth control.  Menstrual cycle.  Pregnancy history. Screening  You may have the following tests or measurements:  Height, weight, and BMI.  Blood pressure.  Lipid and cholesterol levels. These may be checked every 5 years, or more frequently if you are over 28 years old.  Skin check.  Lung cancer screening. You may have this screening every year starting at age 76 if you have a 30-pack-year history of smoking and currently smoke or have quit within the past 15 years.  Fecal occult blood test (FOBT) of the stool. You may have this test every year starting at age 36.  Flexible sigmoidoscopy or colonoscopy. You may have a sigmoidoscopy every 5 years or a colonoscopy every 10 years starting at age 24.  Hepatitis C blood test.  Hepatitis B blood test.  Sexually transmitted disease (STD) testing.  Diabetes screening. This is done by checking your blood sugar (glucose) after you have not eaten for a while (fasting). You may have this done every 1-3 years.  Mammogram. This may be done every 1-2 years. Talk to your health care provider about when you should start having regular mammograms. This may depend on whether you have a family history of breast cancer.  BRCA-related cancer screening. This  may be done if you have a family history of breast, ovarian, tubal, or peritoneal cancers.  Pelvic exam and Pap test. This may be done every 3 years starting at age 22. Starting at age 15, this may be done every 5 years if you have a Pap test in  combination with an HPV test.  Bone density scan. This is done to screen for osteoporosis. You may have this scan if you are at high risk for osteoporosis. Discuss your test results, treatment options, and if necessary, the need for more tests with your health care provider. Vaccines  Your health care provider may recommend certain vaccines, such as:  Influenza vaccine. This is recommended every year.  Tetanus, diphtheria, and acellular pertussis (Tdap, Td) vaccine. You may need a Td booster every 10 years.  Zoster vaccine. You may need this after age 90.  Pneumococcal 13-valent conjugate (PCV13) vaccine. You may need this if you have certain conditions and were not previously vaccinated.  Pneumococcal polysaccharide (PPSV23) vaccine. You may need one or two doses if you smoke cigarettes or if you have certain conditions. Talk to your health care provider about which screenings and vaccines you need and how often you need them. This information is not intended to replace advice given to you by your health care provider. Make sure you discuss any questions you have with your health care provider. Document Released: 03/30/2015 Document Revised: 11/21/2015 Document Reviewed: 01/02/2015 Elsevier Interactive Patient Education  2017 [place] Prevention in the Home Falls can cause injuries. They can happen to people of all ages. There are many things you can do to make your home safe and to help prevent falls. What can I do on the outside of my home?  Regularly fix the edges of walkways and driveways and fix any cracks.  Remove anything that might make you trip as you walk through a door, such as a raised step or threshold.  Trim any bushes or trees on the path to your home.  Use bright outdoor lighting.  Clear any walking paths of anything that might make someone trip, such as rocks or tools.  Regularly check to see if handrails are loose or broken. Make sure that both  sides of any steps have handrails.  Any raised decks and porches should have guardrails on the edges.  Have any leaves, snow, or ice cleared regularly.  Use sand or salt on walking paths during winter.  Clean up any spills in your garage right away. This includes oil or grease spills. What can I do in the bathroom?  Use night lights.  Install grab bars by the toilet and in the tub and shower. Do not use towel bars as grab bars.  Use non-skid mats or decals in the tub or shower.  If you need to sit down in the shower, use a plastic, non-slip stool.  Keep the floor dry. Clean up any water that spills on the floor as soon as it happens.  Remove soap buildup in the tub or shower regularly.  Attach bath mats securely with double-sided non-slip rug tape.  Do not have throw rugs and other things on the floor that can make you trip. What can I do in the bedroom?  Use night lights.  Make sure that you have a light by your bed that is easy to reach.  Do not use any sheets or blankets that are too big for your bed. They should not hang down  onto the floor.  Have a firm chair that has side arms. You can use this for support while you get dressed.  Do not have throw rugs and other things on the floor that can make you trip. What can I do in the kitchen?  Clean up any spills right away.  Avoid walking on wet floors.  Keep items that you use a lot in easy-to-reach places.  If you need to reach something above you, use a strong step stool that has a grab bar.  Keep electrical cords out of the way.  Do not use floor polish or wax that makes floors slippery. If you must use wax, use non-skid floor wax.  Do not have throw rugs and other things on the floor that can make you trip. What can I do with my stairs?  Do not leave any items on the stairs.  Make sure that there are handrails on both sides of the stairs and use them. Fix handrails that are broken or loose. Make sure that  handrails are as long as the stairways.  Check any carpeting to make sure that it is firmly attached to the stairs. Fix any carpet that is loose or worn.  Avoid having throw rugs at the top or bottom of the stairs. If you do have throw rugs, attach them to the floor with carpet tape.  Make sure that you have a light switch at the top of the stairs and the bottom of the stairs. If you do not have them, ask someone to add them for you. What else can I do to help prevent falls?  Wear shoes that:  Do not have high heels.  Have rubber bottoms.  Are comfortable and fit you well.  Are closed at the toe. Do not wear sandals.  If you use a stepladder:  Make sure that it is fully opened. Do not climb a closed stepladder.  Make sure that both sides of the stepladder are locked into place.  Ask someone to hold it for you, if possible.  Clearly mark and make sure that you can see:  Any grab bars or handrails.  First and last steps.  Where the edge of each step is.  Use tools that help you move around (mobility aids) if they are needed. These include:  Canes.  Walkers.  Scooters.  Crutches.  Turn on the lights when you go into a dark area. Replace any light bulbs as soon as they burn out.  Set up your furniture so you have a clear path. Avoid moving your furniture around.  If any of your floors are uneven, fix them.  If there are any pets around you, be aware of where they are.  Review your medicines with your doctor. Some medicines can make you feel dizzy. This can increase your chance of falling. Ask your doctor what other things that you can do to help prevent falls. This information is not intended to replace advice given to you by your health care provider. Make sure you discuss any questions you have with your health care provider. Document Released: 12/28/2008 Document Revised: 08/09/2015 Document Reviewed: 04/07/2014 Elsevier Interactive Patient Education  2017  [employer].

## 2020-06-25 ENCOUNTER — Other Ambulatory Visit: Payer: Self-pay | Admitting: Physician Assistant

## 2020-06-25 DIAGNOSIS — M5030 Other cervical disc degeneration, unspecified cervical region: Secondary | ICD-10-CM

## 2020-06-25 NOTE — Telephone Encounter (Signed)
Requested medications are due for refill today.  yes  Requested medications are on the active medications list.  yes  Last refill. 02/20/2020  Future visit scheduled.   No  Notes to clinic.  Medication not delegated - Rx signed by Marlyn Corporal.

## 2020-06-26 DIAGNOSIS — M9903 Segmental and somatic dysfunction of lumbar region: Secondary | ICD-10-CM | POA: Diagnosis not present

## 2020-06-26 DIAGNOSIS — M5441 Lumbago with sciatica, right side: Secondary | ICD-10-CM | POA: Diagnosis not present

## 2020-06-26 DIAGNOSIS — M9901 Segmental and somatic dysfunction of cervical region: Secondary | ICD-10-CM | POA: Diagnosis not present

## 2020-06-26 DIAGNOSIS — M9904 Segmental and somatic dysfunction of sacral region: Secondary | ICD-10-CM | POA: Diagnosis not present

## 2020-06-27 ENCOUNTER — Telehealth: Payer: Self-pay

## 2020-06-27 ENCOUNTER — Encounter: Payer: Self-pay | Admitting: Physician Assistant

## 2020-06-27 NOTE — Telephone Encounter (Signed)
Copied from [place] [phone]. Topic: General - Other >> Jun 27, 2020  3:54 PM Sabrina Morris, [place] C wrote: Reason for CRM: pt called in to follow up on message that was sent via mychart. Pt says that her form was faxed. Pt would like to make sure that her form will be completed due to provider no longer being in the office.

## 2020-06-28 ENCOUNTER — Encounter: Payer: Self-pay | Admitting: Internal Medicine

## 2020-06-28 NOTE — Telephone Encounter (Signed)
Pt verbalized understanding of information below. Pt said she will call back to schedule an appt.

## 2020-06-28 NOTE — Telephone Encounter (Signed)
Pt was contacted via mychart about this.

## 2020-07-16 DIAGNOSIS — M9903 Segmental and somatic dysfunction of lumbar region: Secondary | ICD-10-CM | POA: Diagnosis not present

## 2020-07-16 DIAGNOSIS — M5441 Lumbago with sciatica, right side: Secondary | ICD-10-CM | POA: Diagnosis not present

## 2020-07-16 DIAGNOSIS — M9901 Segmental and somatic dysfunction of cervical region: Secondary | ICD-10-CM | POA: Diagnosis not present

## 2020-07-16 DIAGNOSIS — M9904 Segmental and somatic dysfunction of sacral region: Secondary | ICD-10-CM | POA: Diagnosis not present

## 2020-07-23 ENCOUNTER — Encounter: Payer: Self-pay | Admitting: Physician Assistant

## 2020-07-23 NOTE — Telephone Encounter (Signed)
She needs to schedule office visit to be evaluated or go to urgent care.

## 2020-07-27 ENCOUNTER — Ambulatory Visit: Payer: Medicare Other | Admitting: Family Medicine

## 2020-07-31 ENCOUNTER — Encounter: Payer: Self-pay | Admitting: Family Medicine

## 2020-07-31 ENCOUNTER — Ambulatory Visit (INDEPENDENT_AMBULATORY_CARE_PROVIDER_SITE_OTHER): Payer: Medicare Other | Admitting: Family Medicine

## 2020-07-31 ENCOUNTER — Ambulatory Visit
Admission: RE | Admit: 2020-07-31 | Discharge: 2020-07-31 | Disposition: A | Discharge status: Home / Self Care | Payer: Medicare Other | Attending: Family Medicine | Admitting: Family Medicine

## 2020-07-31 ENCOUNTER — Ambulatory Visit
Admission: RE | Admit: 2020-07-31 | Discharge: 2020-07-31 | Disposition: A | Discharge status: Home / Self Care | Payer: Medicare Other | Source: Ambulatory Visit | Attending: Family Medicine | Admitting: Family Medicine

## 2020-07-31 ENCOUNTER — Other Ambulatory Visit: Payer: Self-pay

## 2020-07-31 VITALS — BP 114/75 | HR 99 | Temp 98.3°F | Resp 16 | Ht 65.0 in | Wt 213.0 lb

## 2020-07-31 DIAGNOSIS — R0789 Other chest pain: Secondary | ICD-10-CM | POA: Diagnosis not present

## 2020-07-31 DIAGNOSIS — R0781 Pleurodynia: Secondary | ICD-10-CM

## 2020-07-31 DIAGNOSIS — D508 Other iron deficiency anemias: Secondary | ICD-10-CM | POA: Diagnosis not present

## 2020-07-31 DIAGNOSIS — R000 Tachycardia, unspecified: Secondary | ICD-10-CM

## 2020-07-31 DIAGNOSIS — R079 Chest pain, unspecified: Secondary | ICD-10-CM | POA: Diagnosis not present

## 2020-07-31 NOTE — Progress Notes (Signed)
I,Sabrina Morris,acting as a scribe for Sabrina Durie, MD.,have documented all relevant documentation on the behalf of Sabrina Durie, MD,as directed by  Sabrina Durie, MD while in the presence of Sabrina Durie, MD.   Established patient visit   Patient: Sabrina Morris   DOB: 13-Dec-1965   55 y.o. Female  MRN: [id number] Visit Date: 07/31/2020  Today's healthcare provider: Wilhemena Durie, MD   Chief Complaint  Patient presents with  . Fall   Subjective    Fall The accident occurred more than 1 week ago (2 weeks). Fall occurred: loss of balance. She fell from a height of 1 to 2 ft. Impact surface: wall. Point of impact: chest left side. The pain is at a severity of 4/10. The pain is moderate. The symptoms are aggravated by movement (deep breaths and coughing). Pertinent negatives include no abdominal pain, bowel incontinence, fever, headaches, hearing loss, hematuria, loss of consciousness, nausea, numbness, tingling, visual change or vomiting. She has tried NSAID and acetaminophen for the symptoms. The treatment provided mild relief.    Patient states she fell against the wall 2 weeks ago and hit her rib area on the let side. Patient states she heard a pop sound and has had moderate pain in that area since. Patient states she has pain when she takes a deep breath, coughs, or with certain movement. Patient has been treating symptoms with ibuprofen and Tylenol with mild relief.       Medications: Outpatient Medications Prior to Visit  Medication Sig  . butalbital-acetaminophen-caffeine (BAC) 50-325-40 MG tablet TAKE 1 TO 2 TABLETS BY MOUTH EVERY 6 HOURS AS NEEDED FOR HEADACHE  . calcium carbonate 100 mg/ml SUSP Take by mouth.  . Calcium-Magnesium-Vitamin D 339 231 3321 MG-MG-UNIT TABS Take by mouth. Alternates between 1 tablet once daily and two tablets daily.  . Cholecalciferol (VITAMIN D3) 5000 units TABS Take by mouth daily at 6 (six) AM.  . cyclobenzaprine  (FLEXERIL) 5 MG tablet TAKE 1 TABLET BY MOUTH 3 TIMES A DAY AS NEEDED  . docusate sodium (COLACE) 100 MG capsule Take by mouth daily as needed.  . fluocinonide (LIDEX) 0.05 % external solution Apply 1 application topically 2 (two) times daily.  . furosemide (LASIX) 20 MG tablet TAKE 2 TABLETS BY MOUTH EVERY DAY  . ibuprofen (ADVIL) 600 MG tablet TAKE 1 TABLET (600MG  TOTAL) BY MOUTH EVERY 8 (EIGHT) HOURS AS NEEDED.  Marland Kitchen LORazepam (ATIVAN) 1 MG tablet TAKE 1 TABLET BY MOUTH TWICE A DAY AS NEEDED  . meloxicam (MOBIC) 15 MG tablet Take 1 tablet (15 mg total) by mouth daily.  . Multiple Vitamin (MULTIVITAMIN) capsule Take 1 capsule by mouth daily.  Marland Kitchen nystatin ointment (MYCOSTATIN) Apply 1 application topically 2 (two) times daily. At mastectomy site as needed  . oxyCODONE-acetaminophen (PERCOCET/ROXICET) 5-325 MG tablet TAKE 1 TO 2 TABLETS BY MOUTH EVERY 6 HOURS AS NEEDED FOR SEVERE PAIN  . phentermine (ADIPEX-P) 37.5 MG tablet TAKE 1/2 TO 1 TABLET BY MOUTH DAILY BEFORE BREAKFAST.  Marland Kitchen potassium chloride (KLOR-CON) 10 MEQ tablet Take 1 tablet (10 mEq total) by mouth daily.  . promethazine (PHENERGAN) 25 MG tablet Take 1 tablet (25 mg total) by mouth every 8 (eight) hours as needed for nausea or vomiting.  . rizatriptan (MAXALT) 10 MG tablet TAKE 1 TABLET BY MOUTH AT FIRST SIGN OF MIGRAINE SYMPTOMS. IF NO RELIEF, A SECOND TABLET MAY BE TAKEN IN 2 HOURS. LIMIT OF 2 TABLETS PER DAY.  Marland Kitchen temazepam (  RESTORIL) 15 MG capsule TAKE 1 TO 2 CAPSULES BY MOUTH AT BEDTIMEAS NEEDED FOR SLEEP  . valACYclovir (VALTREX) 500 MG tablet TAKE 1 TABLET BY MOUTH 2 TIMES DAILY AS NEEDED FOR FEVER BLISTERS   No facility-administered medications prior to visit.    Review of Systems  Constitutional: Negative for appetite change, chills, fatigue and fever.  Respiratory: Negative for chest tightness and shortness of breath.   Cardiovascular: Negative for chest pain and palpitations.  Gastrointestinal: Negative for abdominal pain,  bowel incontinence, nausea and vomiting.  Genitourinary: Negative for hematuria.  Neurological: Negative for dizziness, tingling, loss of consciousness, weakness, numbness and headaches.        Objective    BP 114/75 (BP Location: Left Arm, Patient Position: Sitting, Cuff Size: Large)   Pulse 99   Temp 98.3 F (36.8 C) (Oral)   Resp 16   Ht 5\' 5"  (1.651 m)   Wt 213 lb (96.6 kg)   SpO2 97%   BMI 35.45 kg/m  BP Readings from Last 3 Encounters:  07/31/20 114/75  06/15/20 133/67  04/17/20 110/70   Wt Readings from Last 3 Encounters:  07/31/20 213 lb (96.6 kg)  06/15/20 212 lb 3.2 oz (96.3 kg)  04/17/20 210 lb (95.3 kg)       Physical Exam Vitals reviewed.  Constitutional:      General: She is not in acute distress.    Appearance: Normal appearance. She is well-developed. She is obese. She is not ill-appearing or diaphoretic.  HENT:     Right Ear: External ear normal.     Left Ear: External ear normal.     Nose: Nose normal.  Eyes:     General: No scleral icterus.    Conjunctiva/sclera: Conjunctivae normal.  Cardiovascular:     Rate and Rhythm: Normal rate and regular rhythm.     Pulses: Normal pulses.     Heart sounds: Normal heart sounds. No murmur heard. No friction rub. No gallop.   Pulmonary:     Effort: Pulmonary effort is normal. No respiratory distress.     Breath sounds: Normal breath sounds. No wheezing or rales.  Abdominal:     Palpations: Abdomen is soft.  Musculoskeletal:     Cervical back: Normal range of motion and neck supple.  Skin:    General: Skin is warm and dry.  Neurological:     General: No focal deficit present.     Mental Status: She is alert and oriented to person, place, and time.  Psychiatric:        Mood and Affect: Mood normal.        Behavior: Behavior normal.        Thought Content: Thought content normal.        Judgment: Judgment normal.       No results found for any visits on 07/31/20.  Assessment & Plan     1.  Iron deficiency anemia secondary to inadequate dietary iron intake Follow-up labs. - CBC with Differential/Platelet - Iron, TIBC and Ferritin Panel - TSH  2. Tachycardia Mild and transient.  Possibly secondary to anemia or anxiety  3. Rib pain Improving  4. Chest wall pain Continue alternating ice and heat.  Obtain chest x-ray to rule out possible fractures from trauma. - DG Chest 2 View; Future 5.  Breast cancer  No follow-ups on file.      I, Sabrina Durie, MD, have reviewed all documentation for this visit. The documentation on 08/10/20 for the exam,  diagnosis, procedures, and orders are all accurate and complete.    Chalmer Zheng Cranford Mon, MD  [facility] [phone] (phone) [phone] (fax)  [place]

## 2020-08-01 ENCOUNTER — Encounter: Payer: Self-pay | Admitting: Internal Medicine

## 2020-08-01 LAB — CBC WITH DIFFERENTIAL/PLATELET
Basophils Absolute: 0 10*3/uL (ref 0.0–0.2)
Basos: 1 %
EOS (ABSOLUTE): 0.2 10*3/uL (ref 0.0–0.4)
Eos: 2 %
Hematocrit: 51.5 % — ABNORMAL HIGH (ref 34.0–46.6)
Hemoglobin: 17.2 g/dL — ABNORMAL HIGH (ref 11.1–15.9)
Immature Grans (Abs): 0 10*3/uL (ref 0.0–0.1)
Immature Granulocytes: 0 %
Lymphocytes Absolute: 2.3 10*3/uL (ref 0.7–3.1)
Lymphs: 27 %
MCH: 30.4 pg (ref 26.6–33.0)
MCHC: 33.4 g/dL (ref 31.5–35.7)
MCV: 91 fL (ref 79–97)
Monocytes Absolute: 0.7 10*3/uL (ref 0.1–0.9)
Monocytes: 8 %
Neutrophils Absolute: 5.3 10*3/uL (ref 1.4–7.0)
Neutrophils: 62 %
Platelets: 297 10*3/uL (ref 150–450)
RBC: 5.66 x10E6/uL — ABNORMAL HIGH (ref 3.77–5.28)
RDW: 12.7 % (ref 11.7–15.4)
WBC: 8.4 10*3/uL (ref 3.4–10.8)

## 2020-08-01 LAB — IRON,TIBC AND FERRITIN PANEL
Ferritin: 111 ng/mL (ref 15–150)
Iron Saturation: 22 % (ref 15–55)
Iron: 78 ug/dL (ref 27–159)
Total Iron Binding Capacity: 355 ug/dL (ref 250–450)
UIBC: 277 ug/dL (ref 131–425)

## 2020-08-01 LAB — TSH: TSH: 3.49 u[IU]/mL (ref 0.450–4.500)

## 2020-08-10 ENCOUNTER — Other Ambulatory Visit: Payer: Self-pay | Admitting: Family Medicine

## 2020-08-10 DIAGNOSIS — F419 Anxiety disorder, unspecified: Secondary | ICD-10-CM

## 2020-08-10 NOTE — Telephone Encounter (Signed)
Requested medications are due for refill today yes  Requested medications are on the active medication list yes  Last refill 3/11  Last visit 07/2020  Future visit scheduled 06/2021  Notes to clinic Not Delegated

## 2020-08-24 ENCOUNTER — Encounter: Payer: Self-pay | Admitting: Family Medicine

## 2020-08-24 DIAGNOSIS — Z1231 Encounter for screening mammogram for malignant neoplasm of breast: Secondary | ICD-10-CM

## 2020-09-19 ENCOUNTER — Other Ambulatory Visit: Payer: Self-pay | Admitting: Family Medicine

## 2020-09-19 DIAGNOSIS — I890 Lymphedema, not elsewhere classified: Secondary | ICD-10-CM

## 2020-09-25 DIAGNOSIS — H524 Presbyopia: Secondary | ICD-10-CM | POA: Diagnosis not present

## 2020-10-15 ENCOUNTER — Encounter: Payer: Self-pay | Admitting: Family Medicine

## 2020-10-16 ENCOUNTER — Other Ambulatory Visit: Payer: Self-pay | Admitting: Family Medicine

## 2020-10-16 DIAGNOSIS — J209 Acute bronchitis, unspecified: Secondary | ICD-10-CM | POA: Diagnosis not present

## 2020-10-16 NOTE — Telephone Encounter (Signed)
Would need to be evaluated. See if anyone has availability

## 2020-10-18 ENCOUNTER — Encounter: Payer: Self-pay | Admitting: Family Medicine

## 2020-10-18 NOTE — Telephone Encounter (Signed)
Please change directions on [facility] to 1 puff BID #60 r2

## 2020-10-19 ENCOUNTER — Other Ambulatory Visit: Payer: Self-pay | Admitting: Family Medicine

## 2020-10-19 ENCOUNTER — Other Ambulatory Visit: Payer: Self-pay | Admitting: Physician Assistant

## 2020-10-19 DIAGNOSIS — G43809 Other migraine, not intractable, without status migrainosus: Secondary | ICD-10-CM

## 2020-10-22 ENCOUNTER — Other Ambulatory Visit: Payer: Self-pay | Admitting: Family Medicine

## 2020-10-22 ENCOUNTER — Ambulatory Visit: Payer: Medicare Other | Admitting: Family Medicine

## 2020-10-22 ENCOUNTER — Telehealth: Payer: Self-pay

## 2020-10-22 DIAGNOSIS — M5030 Other cervical disc degeneration, unspecified cervical region: Secondary | ICD-10-CM

## 2020-10-22 NOTE — Telephone Encounter (Signed)
Copied from [place] [phone]. Topic: Quick Sport and exercise psychologist Patient (Clinic Use ONLY) >> Oct 19, 2020  2:07 PM Sula Rumple wrote: Reason for CRM: Dr. Brita Romp will be out of the office Monday.  LVM for pt to call back and r/s appt.  Office an work pt in. >> Oct 22, 2020 10:11 AM Yvette Rack wrote: Pt called to reschedule appt. Please contact pt to be worked in. Pt appt currently rescheduled for 12/31/20

## 2020-10-23 ENCOUNTER — Other Ambulatory Visit: Payer: Self-pay

## 2020-10-23 MED ORDER — FLUTICASONE-SALMETEROL 250-50 MCG/ACT IN AEPB: 1.0000 | INHALATION_SPRAY | Freq: Two times a day (BID) | RESPIRATORY_TRACT | 2 refills | Status: DC

## 2020-10-23 NOTE — Telephone Encounter (Signed)
Called pt and left vm to call back.

## 2020-10-24 ENCOUNTER — Other Ambulatory Visit: Payer: Self-pay

## 2020-10-24 ENCOUNTER — Ambulatory Visit
Admission: RE | Admit: 2020-10-24 | Discharge: 2020-10-24 | Disposition: A | Discharge status: Home / Self Care | Payer: Medicare Other | Source: Ambulatory Visit | Attending: Obstetrics and Gynecology | Admitting: Obstetrics and Gynecology

## 2020-10-24 DIAGNOSIS — Z1231 Encounter for screening mammogram for malignant neoplasm of breast: Secondary | ICD-10-CM

## 2020-11-02 ENCOUNTER — Other Ambulatory Visit: Payer: Self-pay | Admitting: Family Medicine

## 2020-11-02 DIAGNOSIS — F419 Anxiety disorder, unspecified: Secondary | ICD-10-CM
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
## 2020-11-02 NOTE — Telephone Encounter (Signed)
Patient due for f/u of anxiety (she was not seen for this in 5/22).  Will send 1 month refill.  Will need f/u appt prior to further refills.

## 2020-11-02 NOTE — Telephone Encounter (Signed)
LOV : 07/31/2020 NOV: 12/31/2020

## 2020-11-03 DIAGNOSIS — I972 Postmastectomy lymphedema syndrome: Secondary | ICD-10-CM | POA: Diagnosis not present

## 2020-12-04 ENCOUNTER — Telehealth: Payer: Self-pay

## 2020-12-04 NOTE — Telephone Encounter (Signed)
Copied from [place] [phone]. Topic: Appointment Scheduling - Scheduling Inquiry for Clinic >> Dec 04, 2020 11:06 AM Yvette Rack wrote: Reason for CRM: Pt would like to schedule appt for CPE with Tally Joe. Cb# [phone]

## 2020-12-04 NOTE — Telephone Encounter (Signed)
Left vm for pt to callback 

## 2020-12-19 ENCOUNTER — Ambulatory Visit: Payer: Medicare Other | Admitting: Internal Medicine

## 2020-12-19 ENCOUNTER — Encounter: Payer: Self-pay | Admitting: Internal Medicine

## 2020-12-19 ENCOUNTER — Ambulatory Visit: Payer: Medicare Other

## 2020-12-19 ENCOUNTER — Other Ambulatory Visit: Payer: Medicare Other

## 2020-12-21 ENCOUNTER — Ambulatory Visit: Payer: Medicare Other

## 2020-12-21 ENCOUNTER — Other Ambulatory Visit: Payer: Medicare Other

## 2020-12-21 ENCOUNTER — Ambulatory Visit: Payer: Medicare Other | Admitting: Internal Medicine

## 2020-12-21 ENCOUNTER — Other Ambulatory Visit: Payer: Self-pay | Admitting: *Deleted

## 2020-12-21 DIAGNOSIS — D751 Secondary polycythemia: Secondary | ICD-10-CM

## 2020-12-21 DIAGNOSIS — M47816 Spondylosis without myelopathy or radiculopathy, lumbar region: Secondary | ICD-10-CM | POA: Diagnosis not present

## 2020-12-21 DIAGNOSIS — D500 Iron deficiency anemia secondary to blood loss (chronic): Secondary | ICD-10-CM

## 2020-12-21 DIAGNOSIS — S32050D Wedge compression fracture of fifth lumbar vertebra, subsequent encounter for fracture with routine healing: Secondary | ICD-10-CM | POA: Diagnosis not present

## 2020-12-21 DIAGNOSIS — M47818 Spondylosis without myelopathy or radiculopathy, sacral and sacrococcygeal region: Secondary | ICD-10-CM | POA: Diagnosis not present

## 2020-12-24 ENCOUNTER — Inpatient Hospital Stay: Payer: Medicare Other

## 2020-12-24 ENCOUNTER — Encounter: Payer: Self-pay | Admitting: Internal Medicine

## 2020-12-24 ENCOUNTER — Inpatient Hospital Stay: Payer: Medicare Other | Attending: Internal Medicine | Admitting: Internal Medicine

## 2020-12-24 VITALS — BP 129/62 | HR 88 | Temp 98.6°F | Resp 18 | Wt 213.0 lb

## 2020-12-24 DIAGNOSIS — D500 Iron deficiency anemia secondary to blood loss (chronic): Secondary | ICD-10-CM

## 2020-12-24 DIAGNOSIS — M2550 Pain in unspecified joint: Secondary | ICD-10-CM | POA: Diagnosis not present

## 2020-12-24 DIAGNOSIS — R5383 Other fatigue: Secondary | ICD-10-CM | POA: Diagnosis not present

## 2020-12-24 DIAGNOSIS — Z803 Family history of malignant neoplasm of breast: Secondary | ICD-10-CM | POA: Insufficient documentation

## 2020-12-24 DIAGNOSIS — Z79899 Other long term (current) drug therapy: Secondary | ICD-10-CM | POA: Diagnosis not present

## 2020-12-24 DIAGNOSIS — D509 Iron deficiency anemia, unspecified: Secondary | ICD-10-CM | POA: Diagnosis not present

## 2020-12-24 DIAGNOSIS — D751 Secondary polycythemia: Secondary | ICD-10-CM | POA: Insufficient documentation

## 2020-12-24 DIAGNOSIS — M549 Dorsalgia, unspecified: Secondary | ICD-10-CM | POA: Diagnosis not present

## 2020-12-24 DIAGNOSIS — I890 Lymphedema, not elsewhere classified: Secondary | ICD-10-CM | POA: Diagnosis not present

## 2020-12-24 DIAGNOSIS — Z853 Personal history of malignant neoplasm of breast: Secondary | ICD-10-CM | POA: Diagnosis not present

## 2020-12-24 LAB — CBC WITH DIFFERENTIAL/PLATELET
Abs Immature Granulocytes: 0.02 10*3/uL (ref 0.00–0.07)
Basophils Absolute: 0 10*3/uL (ref 0.0–0.1)
Basophils Relative: 1 %
Eosinophils Absolute: 0.2 10*3/uL (ref 0.0–0.5)
Eosinophils Relative: 3 %
HCT: 46.2 % — ABNORMAL HIGH (ref 36.0–46.0)
Hemoglobin: 15.7 g/dL — ABNORMAL HIGH (ref 12.0–15.0)
Immature Granulocytes: 0 %
Lymphocytes Relative: 27 %
Lymphs Abs: 2.2 10*3/uL (ref 0.7–4.0)
MCH: 30.5 pg (ref 26.0–34.0)
MCHC: 34 g/dL (ref 30.0–36.0)
MCV: 89.9 fL (ref 80.0–100.0)
Monocytes Absolute: 0.6 10*3/uL (ref 0.1–1.0)
Monocytes Relative: 8 %
Neutro Abs: 4.8 10*3/uL (ref 1.7–7.7)
Neutrophils Relative %: 61 %
Platelets: 261 10*3/uL (ref 150–400)
RBC: 5.14 MIL/uL — ABNORMAL HIGH (ref 3.87–5.11)
RDW: 13.2 % (ref 11.5–15.5)
WBC: 7.9 10*3/uL (ref 4.0–10.5)
nRBC: 0 % (ref 0.0–0.2)

## 2020-12-24 LAB — COMPREHENSIVE METABOLIC PANEL
ALT: 22 U/L (ref 0–44)
AST: 18 U/L (ref 15–41)
Albumin: 4.5 g/dL (ref 3.5–5.0)
Alkaline Phosphatase: 84 U/L (ref 38–126)
Anion gap: 7 (ref 5–15)
BUN: 21 mg/dL — ABNORMAL HIGH (ref 6–20)
CO2: 25 mmol/L (ref 22–32)
Calcium: 9.1 mg/dL (ref 8.9–10.3)
Chloride: 102 mmol/L (ref 98–111)
Creatinine, Ser: 0.88 mg/dL (ref 0.44–1.00)
GFR, Estimated: >60 mL/min (ref >60)
Glucose, Bld: 106 mg/dL — ABNORMAL HIGH (ref 70–99)
Potassium: 3.9 mmol/L (ref 3.5–5.1)
Sodium: 134 mmol/L — ABNORMAL LOW (ref 135–145)
Total Bilirubin: 0.4 mg/dL (ref 0.3–1.2)
Total Protein: 7.9 g/dL (ref 6.5–8.1)

## 2020-12-24 LAB — IRON AND TIBC
Iron: 94 ug/dL (ref 28–170)
Saturation Ratios: 24 % (ref 10.4–31.8)
TIBC: 395 ug/dL (ref 250–450)
UIBC: 301 ug/dL

## 2020-12-24 LAB — FERRITIN: Ferritin: 48 ng/mL (ref 11–307)

## 2020-12-24 NOTE — Progress Notes (Signed)
Patient Sabrina Morris NOTE  Patient Care Team: Gwyneth Sprout, FNP as PCP - General (Family Medicine) Ellwood Handler., MD (Gastroenterology) Pa, Sarah Ann Od  CHIEF COMPLAINTS/PURPOSE OF CONSULTATION: Anemia/ breast cancer   HEMATOLOGY HISTORY  # Iron deficiency anemia -AUG 3rd 2020-hemoglobin 7.4 MCV 67; ferritin 3/iron saturation 3% [ EGD/colonoscopy- 2017 [screening;[facility]; Dr.Buccini; [place];capsule-none]. SEP 2020- CT abdomen pelvis-large hiatal hernia.  Feraheme-intolerance; OCT 2020-[facility]- GI [scopes/capsule]-  #Hiatal hernia status post surgery April 2021 [facility]; anemia resolved  # July 2021-erythrocytosis hemoglobin 16  #Right breast cancer ER PR positive HER-2 negative; T3N1 at 45 right s/p mastectomy N-1/20 LN- ALND s/p RT; TAC x 6 cycles [Dr.Marcom/Dr.Gudena]  # Lymphedema [Dr.Malone]  Oncology History  History of breast cancer  01/01/2011 Initial Diagnosis   Patient presented with palpable lump in the right breast a dumbbell-shaped tumor was identified spanning 7 cm   02/24/2011 Surgery   Right mastectomy revealed a dumbbell-shaped mass 3.5 cm and 2.5 cm with a total span of 7 cm 1/20 lymph nodes were positive ER/PR positive HER-2 negative   05/07/2011 - 09/04/2011 Chemotherapy   Adjuvant chemotherapy with Taxol, Adriamycin, Cytoxan every 3 weeks 6 cycles at [facility] ([email or web address])   09/19/2011 - 10/29/2011 Radiation Therapy   Adjuvant radiation therapy 30 fractions including right axilla   12/01/2011 - 04/01/2013 Anti-estrogen oral therapy   Adjuvant tamoxifen was started reluctantly, Zoladex was added January 2014 and antiestrogen therapy was discontinued January 2015 (profound lymphedema and anasarca )      HISTORY OF PRESENTING ILLNESS: Ambulating independently.  Alone.  Sabrina Morris 55 y.o.  female with prior history of above breast cancer; and Iron deficiency anemia; erythrocytosis secondary is here for  follow-up.  Patient having difficulty with wearing the mask because of facial lymphedema.  Patient denies any new onset of pain.  Chronic mild fatigue.    Patient complains of bloating.  Also complains of early satiety.  Review of Systems  Constitutional:  Positive for malaise/fatigue. Negative for chills, diaphoresis and fever.  HENT:  Negative for nosebleeds and sore throat.   Eyes:  Negative for double vision.  Respiratory:  Negative for cough, hemoptysis, sputum production and wheezing.   Cardiovascular:  Negative for chest pain, palpitations, orthopnea and leg swelling.  Gastrointestinal:  Negative for abdominal pain, blood in stool, constipation, diarrhea, heartburn, melena, nausea and vomiting.  Genitourinary:  Negative for dysuria, frequency and urgency.  Musculoskeletal:  Positive for back pain and joint pain.  Skin: Negative.  Negative for itching and rash.  Neurological:  Negative for dizziness, tingling, focal weakness, weakness and headaches.  Endo/Heme/Allergies:  Does not bruise/bleed easily.  Psychiatric/Behavioral:  Negative for depression. The patient is not nervous/anxious and does not have insomnia.    MEDICAL HISTORY:  Past Medical History:  Diagnosis Date   Adrenal disorder ([place])    Anemia 11/2018   Breast cancer ([place])    H/O bone density study 2015   H/O colonoscopy    sch 06/12/15   H/O colonoscopy 11/2018   H/O endoscopy 11/2018   upper   Lymphedema    Lymphedema    Migraine    Pap smear for cervical cancer screening [phone]    SURGICAL HISTORY: Past Surgical History:  Procedure Laterality Date   breast  Left 02/18/2017   Implant removed   BREAST ENHANCEMENT SURGERY Bilateral 2005   [place] Right 02/2011   PORT  A CATH INJECTION ([place] [facility])  2013    SOCIAL HISTORY: Social History   Socioeconomic History   Marital status: Divorced    Spouse name: Not on file   Number of  children: 0   Years of education: Not on file   Highest education level: Associate degree: occupational, Hotel manager, or vocational program  Occupational History   Occupation: Therapist, sports    Comment: part time   Occupation: disability  Tobacco Use   Smoking status: Never   Smokeless tobacco: Never  Vaping Use   Vaping Use: Never used  Substance and Sexual Activity   Alcohol use: No   Drug use: No   Sexual activity: Not Currently    Birth control/protection: Post-menopausal  Other Topics Concern   Not on file  Social History Narrative   Disability [sec to [place]; 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  No smoking or alcohol. Lives at home/with sister.    Social Determinants of Health   Financial Resource Strain: Medium Risk   Difficulty of Paying Living Expenses: Somewhat hard  Food Insecurity: No Food Insecurity   Worried About Charity fundraiser in the Last Year: Never true   Ran Out of Food in the Last Year: Never true  Transportation Needs: No Transportation Needs   Lack of Transportation (Medical): No   Lack of Transportation (Non-Medical): No  Physical Activity: Inactive   Days of Exercise per Week: 0 days   Minutes of Exercise per Session: 0 min  Stress: Stress Concern Present   Feeling of Stress : Rather much  Social Connections: Socially Isolated   Frequency of Communication with Friends and Family: More than three times a week   Frequency of Social Gatherings with Friends and Family: Three times a week   Attends Religious Services: Never   Active Member of Clubs or Organizations: No   Attends Music therapist: Never   Marital Status: Divorced  Human resources officer Violence: Not At Risk   Fear of Current or Ex-Partner: No   Emotionally Abused: No   Physically Abused: No   Sexually Abused: No    FAMILY HISTORY: Family History  Adopted: Yes  Problem Relation Age of Onset   Breast cancer Sister 35       1/2 sister; Genetic testing neg  [facility]    ALLERGIES:  is allergic to sumatriptan, feraheme [ferumoxytol], haemophilus influenzae vaccines, other, phentermine-topiramate, sulfa antibiotics, sulfamethoxazole, tamoxifen, zoladex  [goserelin], codeine, and hydrocodone-acetaminophen.  MEDICATIONS:  Current Outpatient Medications  Medication Sig Dispense Refill   butalbital-acetaminophen-caffeine (FIORICET) 50-325-40 MG tablet TAKE 1 TO 2 TABLETS BY MOUTH EVERY 6 HOURS AS NEEDED FOR HEADACHE 60 tablet 0   calcium carbonate 100 mg/ml SUSP Take by mouth.     Calcium-Magnesium-Vitamin D [phone] MG-MG-UNIT TABS Take by mouth. Alternates between 1 tablet once daily and two tablets daily.     Cholecalciferol (VITAMIN D3) 5000 units TABS Take by mouth daily at 6 (six) AM.     cyclobenzaprine (FLEXERIL) 5 MG tablet TAKE 1 TABLET BY MOUTH 3 TIMES A DAY AS NEEDED 90 tablet 1   docusate sodium (COLACE) 100 MG capsule Take by mouth daily as needed.     fluocinonide (LIDEX) 0.05 % external solution Apply 1 application topically 2 (two) times daily. 60 mL 3   fluticasone-salmeterol (WIXELA INHUB) 250-50 MCG/ACT AEPB Inhale 1 puff into the lungs in the morning and at bedtime. 60 each 2   furosemide (LASIX) 20 MG  tablet TAKE 2 TABLETS BY MOUTH EVERY DAY 60 tablet 5   ibuprofen (ADVIL) 600 MG tablet TAKE 1 TABLET (600 MG TOTAL) BY MOUTH EVERY 8 HOURS AS NEEDED 360 tablet 1   LORazepam (ATIVAN) 1 MG tablet TAKE 1 TABLET BY MOUTH TWICE A DAY AS NEEDED 60 tablet 0   melatonin 3 MG TABS tablet Take 3 mg by mouth at bedtime.     meloxicam (MOBIC) 15 MG tablet Take 1 tablet (15 mg total) by mouth daily. 30 tablet 3   Multiple Vitamin (MULTIVITAMIN) capsule Take 1 capsule by mouth daily.     oxyCODONE-acetaminophen (PERCOCET/ROXICET) 5-325 MG tablet TAKE 1 TO 2 TABLETS BY MOUTH EVERY 6 HOURS AS NEEDED FOR SEVERE PAIN 15 tablet 0   phentermine (ADIPEX-P) 37.5 MG tablet TAKE 1/2 TO 1 TABLET BY MOUTH DAILY BEFORE BREAKFAST. 30 tablet 0   potassium  chloride (KLOR-CON) 10 MEQ tablet Take 1 tablet (10 mEq total) by mouth daily. 90 tablet 1   promethazine (PHENERGAN) 12.5 MG tablet TAKE 1 TABLET BY MOUTH EVERY 6 HOURS AS NEEDED FOR NAUSEA FOR UP TO 7 DAYS 28 tablet 0   promethazine (PHENERGAN) 25 MG tablet Take 1 tablet (25 mg total) by mouth every 8 (eight) hours as needed for nausea or vomiting. 20 tablet 3   rizatriptan (MAXALT) 10 MG tablet TAKE 1 TABLET BY MOUTH AT FIRST SIGN OF MIGRAINE SYMPTOMS. IF NO RELIEF, A SECOND TABLET MAY BE TAKEN IN 2 HOURS. LIMIT OF 2 TABLETS PER DAY 30 tablet 2   valACYclovir (VALTREX) 500 MG tablet TAKE 1 TABLET BY MOUTH 2 TIMES DAILY AS NEEDED FOR FEVER BLISTERS 30 tablet 5   nystatin ointment (MYCOSTATIN) Apply 1 application topically 2 (two) times daily. At mastectomy site as needed (Patient not taking: Reported on 12/24/2020)     temazepam (RESTORIL) 15 MG capsule TAKE 1 TO 2 CAPSULES BY MOUTH AT [facility] NEEDED FOR SLEEP (Patient not taking: Reported on 12/24/2020) 60 capsule 5   No current facility-administered medications for this visit.      PHYSICAL EXAMINATION:   Vitals:   12/24/20 1315  BP: 129/62  Pulse: 88  Resp: 18  Temp: 98.6 F (37 C)  SpO2: 98%   Filed Weights   12/24/20 1315  Weight: 213 lb (96.6 kg)    Physical Exam HENT:     Head: Normocephalic and atraumatic.     Mouth/Throat:     Pharynx: No oropharyngeal exudate.  Eyes:     Pupils: Pupils are equal, round, and reactive to light.  Cardiovascular:     Rate and Rhythm: Normal rate and regular rhythm.  Pulmonary:     Effort: Pulmonary effort is normal. No respiratory distress.     Breath sounds: Normal breath sounds. No wheezing.  Abdominal:     General: Bowel sounds are normal. There is no distension.     Palpations: Abdomen is soft. There is no mass.     Tenderness: There is no abdominal tenderness. There is no guarding or rebound.  Musculoskeletal:        General: No tenderness. Normal range of motion.      Cervical back: Normal range of motion and neck supple.  Skin:    General: Skin is warm.  Neurological:     Mental Status: She is alert and oriented to person, place, and time.  Psychiatric:        Mood and Affect: Affect normal.    LABORATORY DATA:  I have reviewed the  data as listed Lab Results  Component Value Date   WBC 7.9 12/24/2020   HGB 15.7 (H) 12/24/2020   HCT 46.2 (H) 12/24/2020   MCV 89.9 12/24/2020   PLT 261 12/24/2020   Recent Labs    02/17/20 1348 06/14/20 1253 12/24/20 1245  NA 141 136 134*  K 4.3 3.8 3.9  CL 97 102 102  CO2 22 23 25   GLUCOSE 96 100* 106*  BUN 19 24* 21*  CREATININE 0.82 0.79 0.88  CALCIUM 9.6 8.8* 9.1  GFRNONAA 81 >60 >60  GFRAA 93  --   --   PROT  --   --  7.9  ALBUMIN  --   --  4.5  AST  --   --  18  ALT  --   --  22  ALKPHOS  --   --  84  BILITOT  --   --  0.4     No results found.  Iron deficiency anemia due to chronic blood loss # History Severe iron deficient anemia-question blood loss appears chronic MCV- resolved s/p since fundoplication. Today hemoglobin is 15.7- STABLE.   # Erythrocytosis secondary- ? Diuretics/ sleep apnea-difficulty wearing CPAP [issues Lymphedema]- [JAK-2/exon -12- NEG]-- STABLE.  # Lymphedema chest wall-stable sec to breast surgery/? Surgery- on PT; on lasix.  Given the difficulty wearing the CPAP machine will refer to [facility].  Patient previously seen [name].  # History of breast cancer RIght -stable on surveillance-mammogram left breast unilateral-August 2022 []   # Bloating- awaiting GI with [facility].   # DISPOSITION: # HOLD IV Iron today # refer to [name] re: lympedema  # follow up in 6 months; MD; labs- cbc/cmp;iron studies/ferritin; possible venofer-Dr.B  Cc; Dr.Gudena  All questions were answered. The patient knows to call the clinic with any problems, questions or concerns.    Cammie Sickle, MD 12/24/2020 1:57 PM

## 2020-12-24 NOTE — Progress Notes (Signed)
Patient here for oncology follow-up appointment, expresses no complaints or concerns at this time.    

## 2020-12-24 NOTE — Assessment & Plan Note (Signed)
#   History Severe iron deficient anemia-question blood loss appears chronic MCV- resolved s/p since fundoplication. Today hemoglobin is 15.7- STABLE.   # Erythrocytosis secondary- ? Diuretics/ sleep apnea-difficulty wearing CPAP [issues Lymphedema]- [JAK-2/exon -12- NEG]-- STABLE.  # Lymphedema chest wall-stable sec to breast surgery/? Surgery- on PT; on lasix.  Given the difficulty wearing the CPAP machine will refer to [facility].  Patient previously seen [name].  # History of breast cancer RIght -stable on surveillance-mammogram left breast unilateral-August 2022 []   # Bloating- awaiting GI with [facility].   # DISPOSITION: # HOLD IV Iron today # refer to [name] re: lympedema  # follow up in 6 months; MD; labs- cbc/cmp;iron studies/ferritin; possible venofer-Dr.B  Cc; Dr.Gudena

## 2020-12-31 ENCOUNTER — Ambulatory Visit: Payer: Medicare Other | Admitting: Family Medicine

## 2021-01-01 ENCOUNTER — Ambulatory Visit (INDEPENDENT_AMBULATORY_CARE_PROVIDER_SITE_OTHER): Payer: Medicare Other | Admitting: Family Medicine

## 2021-01-01 ENCOUNTER — Encounter: Payer: Self-pay | Admitting: Family Medicine

## 2021-01-01 VITALS — BP 100/78 | HR 88 | Temp 97.7°F | Resp 16 | Ht 65.0 in | Wt 212.3 lb

## 2021-01-01 DIAGNOSIS — R635 Abnormal weight gain: Secondary | ICD-10-CM

## 2021-01-01 DIAGNOSIS — M5030 Other cervical disc degeneration, unspecified cervical region: Secondary | ICD-10-CM

## 2021-01-01 DIAGNOSIS — G43809 Other migraine, not intractable, without status migrainosus: Secondary | ICD-10-CM

## 2021-01-01 DIAGNOSIS — I890 Lymphedema, not elsewhere classified: Secondary | ICD-10-CM | POA: Diagnosis not present

## 2021-01-01 DIAGNOSIS — Z79899 Other long term (current) drug therapy: Secondary | ICD-10-CM | POA: Insufficient documentation

## 2021-01-01 DIAGNOSIS — F419 Anxiety disorder, unspecified: Secondary | ICD-10-CM | POA: Diagnosis not present

## 2021-01-01 MED ORDER — FLUTICASONE-SALMETEROL 250-50 MCG/ACT IN AEPB: 1.0000 | INHALATION_SPRAY | Freq: Two times a day (BID) | RESPIRATORY_TRACT | 2 refills | Status: DC

## 2021-01-01 MED ORDER — OXYCODONE-ACETAMINOPHEN 5-325 MG PO TABS: 1.0000 | ORAL_TABLET | Freq: Three times a day (TID) | ORAL | 0 refills | Status: DC | PRN

## 2021-01-01 MED ORDER — PHENTERMINE HCL 37.5 MG PO TABS: 37.5000 mg | ORAL_TABLET | Freq: Every day | ORAL | 0 refills | Status: DC

## 2021-01-01 NOTE — Assessment & Plan Note (Addendum)
Long discussion regarding multiple interacting medications with sedative effects Will need to consider minimizing these Needs ongoing conversations with new PCP

## 2021-01-01 NOTE — Assessment & Plan Note (Signed)
Patient not on preventive therapy but taking fioricet daily - discussed rebound headaches Continue maxalt prn She has not tolerated injections, topamax, propranolol in the past  Consider CGRP orals in the future May consider neuro eval (has seen them >15 years ago - discussed that new medications are available)

## 2021-01-01 NOTE — Progress Notes (Signed)
Established patient visit   Patient: Sabrina Morris   DOB: 30-Dec-1965   55 y.o. Female  MRN: [id number] Visit Date: 01/01/2021  Today's healthcare provider: Lavon Paganini, MD   Chief Complaint  Patient presents with   Follow-up   Subjective    HPI  Follow up for lymphedema  The patient was last seen for this 1 years ago. Changes made at last visit include no changes.  She reports excellent compliance with treatment. She feels that condition is Unchanged. She is not having side effects.   H/o stage 3 breast cancer R side in 2012. Had chemo/radiation/R mastectomy and lymph node dissection. Now has lymphedema and uses pump for 5 hours daily for full body.  Had hiatal hernia repair in 2021  Has previously seen [facility] for [facility]ymphedema at [facility] and [facility] without any improvement  Using CPAP nightly for OSA that may be 2/2 lymphedema  Chronic migraines - failed propranolol, topamax. Taking fioricet nightly   Taking oxycodone prn for achiness from lymphedema flares  Taking Lorazepam as needed - takes mostly daily, about 6 times weekly. Says this helps with her migraines. Takes phenergan prn for nausea with migraines  States that phentermine helps with her lymphedema. Takes 1/4 to half tab as needed.  She believes that we are not understanding the long-term effects of radiation and chemo on her body that has caused all of these problems. -----------------------------------------------------------------------------------------     Medications: Outpatient Medications Prior to Visit  Medication Sig   butalbital-acetaminophen-caffeine (FIORICET) 50-325-40 MG tablet TAKE 1 TO 2 TABLETS BY MOUTH EVERY 6 HOURS AS NEEDED FOR HEADACHE   calcium carbonate 100 mg/ml SUSP Take by mouth.   Calcium-Magnesium-Vitamin D [phone] MG-MG-UNIT TABS Take by mouth. Alternates between 1 tablet once daily and two tablets daily.   Cholecalciferol (VITAMIN D3) 5000 units TABS Take by mouth  daily at 6 (six) AM.   cyclobenzaprine (FLEXERIL) 5 MG tablet TAKE 1 TABLET BY MOUTH 3 TIMES A DAY AS NEEDED   docusate sodium (COLACE) 100 MG capsule Take by mouth daily as needed.   fluocinonide (LIDEX) 0.05 % external solution Apply 1 application topically 2 (two) times daily.   furosemide (LASIX) 20 MG tablet TAKE 2 TABLETS BY MOUTH EVERY DAY   LORazepam (ATIVAN) 1 MG tablet TAKE 1 TABLET BY MOUTH TWICE A DAY AS NEEDED   melatonin 3 MG TABS tablet Take 3 mg by mouth at bedtime.   meloxicam (MOBIC) 15 MG tablet Take 1 tablet (15 mg total) by mouth daily.   Multiple Vitamin (MULTIVITAMIN) capsule Take 1 capsule by mouth daily.   nystatin ointment (MYCOSTATIN) Apply 1 application topically 2 (two) times daily. At mastectomy site as needed   potassium chloride (KLOR-CON) 10 MEQ tablet Take 1 tablet (10 mEq total) by mouth daily.   promethazine (PHENERGAN) 12.5 MG tablet TAKE 1 TABLET BY MOUTH EVERY 6 HOURS AS NEEDED FOR NAUSEA FOR UP TO 7 DAYS   promethazine (PHENERGAN) 25 MG tablet Take 1 tablet (25 mg total) by mouth every 8 (eight) hours as needed for nausea or vomiting.   rizatriptan (MAXALT) 10 MG tablet TAKE 1 TABLET BY MOUTH AT FIRST SIGN OF MIGRAINE SYMPTOMS. IF NO RELIEF, A SECOND TABLET MAY BE TAKEN IN 2 HOURS. LIMIT OF 2 TABLETS PER DAY   valACYclovir (VALTREX) 500 MG tablet TAKE 1 TABLET BY MOUTH 2 TIMES DAILY AS NEEDED FOR FEVER BLISTERS   [DISCONTINUED] fluticasone-salmeterol (WIXELA INHUB) 250-50 MCG/ACT AEPB Inhale 1 puff  into the lungs in the morning and at bedtime.   [DISCONTINUED] ibuprofen (ADVIL) 600 MG tablet TAKE 1 TABLET (600 MG TOTAL) BY MOUTH EVERY 8 HOURS AS NEEDED   [DISCONTINUED] oxyCODONE-acetaminophen (PERCOCET/ROXICET) 5-325 MG tablet TAKE 1 TO 2 TABLETS BY MOUTH EVERY 6 HOURS AS NEEDED FOR SEVERE PAIN   [DISCONTINUED] phentermine (ADIPEX-P) 37.5 MG tablet TAKE 1/2 TO 1 TABLET BY MOUTH DAILY BEFORE BREAKFAST.   [DISCONTINUED] temazepam (RESTORIL) 15 MG capsule TAKE  1 TO 2 CAPSULES BY MOUTH AT BEDTIMEAS NEEDED FOR SLEEP   No facility-administered medications prior to visit.    Review of Systems  Constitutional:  Negative for activity change, appetite change and fatigue.  Respiratory:  Negative for chest tightness and shortness of breath.   Cardiovascular:  Negative for chest pain and palpitations.  Musculoskeletal:  Positive for myalgias.       Objective    BP 100/78 (BP Location: Left Arm, Patient Position: Sitting, Cuff Size: Large)   Pulse 88   Temp 97.7 F (36.5 C) (Oral)   Resp 16   Ht 5\' 5"  (1.651 m)   Wt 212 lb 4.8 oz (96.3 kg)   SpO2 99%   BMI 35.33 kg/m  BP Readings from Last 3 Encounters:  01/01/21 100/78  12/24/20 129/62  07/31/20 114/75   Wt Readings from Last 3 Encounters:  01/01/21 212 lb 4.8 oz (96.3 kg)  12/24/20 213 lb (96.6 kg)  07/31/20 213 lb (96.6 kg)      Physical Exam Vitals reviewed.  Constitutional:      General: She is not in acute distress.    Appearance: Normal appearance. She is well-developed. She is not diaphoretic.  HENT:     Head: Normocephalic and atraumatic.  Eyes:     General: No scleral icterus.    Conjunctiva/sclera: Conjunctivae normal.  Neck:     Thyroid: No thyromegaly.  Cardiovascular:     Rate and Rhythm: Normal rate and regular rhythm.     Pulses: Normal pulses.     Heart sounds: Normal heart sounds. No murmur heard. Pulmonary:     Effort: Pulmonary effort is normal. No respiratory distress.     Breath sounds: Normal breath sounds. No wheezing, rhonchi or rales.  Musculoskeletal:     Cervical back: Neck supple.     Right lower leg: Edema present.     Left lower leg: Edema present.  Lymphadenopathy:     Cervical: No cervical adenopathy.  Skin:    General: Skin is warm and dry.     Findings: No rash.  Neurological:     Mental Status: She is alert and oriented to person, place, and time. Mental status is at baseline.  Psychiatric:        Mood and Affect: Mood normal.         Behavior: Behavior normal.      No results found for any visits on 01/01/21.  Assessment & Plan     Problem List Items Addressed This Visit       Cardiovascular and Mediastinum   Headache, migraine    Patient not on preventive therapy but taking fioricet daily - discussed rebound headaches Continue maxalt prn She has not tolerated injections, topamax, propranolol in the past  Consider CGRP orals in the future May consider neuro eval (has seen them >15 years ago - discussed that new medications are available)      Relevant Medications   oxyCODONE-acetaminophen (PERCOCET/ROXICET) 5-325 MG tablet     Other  Anxiety    Has been on long term benzo prn She is taking this prn currently Would consider decreasing number of pills, trying SSRI/SNRI, encouraging therapy Would stop benzo use for migraines      Acquired lymphedema - Primary    Long discussion regarding her total body lymphedema She will continue to use lasix and her lymph pump She has seen lymph specialty clinics in the past without improvement We discussed that phentermine is a very unusual treatment for lymphedema and I cannot work out how the pathophysiology of a stimulant helping with lymphedema would work (I am also concerned about her polypharmacy as below) I did give her a small supply of phentermine as she states she is using this infrequently Will need ongoing conversations with new PCP about appropriate treatments      Abnormal weight gain   Polypharmacy    Long discussion regarding multiple interacting medications with sedative effects Will need to consider minimizing these Needs ongoing conversations with new PCP      Other Visit Diagnoses     DDD (degenerative disc disease), cervical       Relevant Medications   oxyCODONE-acetaminophen (PERCOCET/ROXICET) 5-325 MG tablet         Return in about 4 weeks (around 01/29/2021) for CPE, With new PCP, as scheduled.      Total time spent on  today's visit was greater than 40 minutes, including both face-to-face time and nonface-to-face time personally spent on review of chart (labs and imaging), discussing labs and goals, discussing further work-up, treatment options, referrals to specialist if needed, reviewing outside records of pertinent, answering patient's questions, and coordinating care.    I, Lavon Paganini, MD, have reviewed all documentation for this visit. The documentation on 01/01/21 for the exam, diagnosis, procedures, and orders are all accurate and complete.   Randye Treichler, Dionne Bucy, MD, MPH [facility]

## 2021-01-01 NOTE — Addendum Note (Signed)
Addended by: Virginia Crews on: 01/01/2021 10:15 AM   Modules accepted: Level of Service

## 2021-01-01 NOTE — Assessment & Plan Note (Signed)
>>  ASSESSMENT AND PLAN FOR ACQUIRED LYMPHEDEMA WRITTEN ON 01/01/2021 10:08 AM BY MYRLA JON HERO, MD  Long discussion regarding her total body lymphedema She will continue to use lasix  and her lymph pump She has seen lymph specialty clinics in the past without improvement We discussed that phentermine  is a very unusual treatment for lymphedema and I cannot work out how the pathophysiology of a stimulant helping with lymphedema would work (I am also concerned about her polypharmacy as below) I did give her a small supply of phentermine  as she states she is using this infrequently Will need ongoing conversations with new PCP about appropriate treatments

## 2021-01-01 NOTE — Assessment & Plan Note (Signed)
Has been on long term benzo prn She is taking this prn currently Would consider decreasing number of pills, trying SSRI/SNRI, encouraging therapy Would stop benzo use for migraines

## 2021-01-01 NOTE — Assessment & Plan Note (Signed)
Long discussion regarding her total body lymphedema She will continue to use lasix and her lymph pump She has seen lymph specialty clinics in the past without improvement We discussed that phentermine is a very unusual treatment for lymphedema and I cannot work out how the pathophysiology of a stimulant helping with lymphedema would work (I am also concerned about her polypharmacy as below) I did give her a small supply of phentermine as she states she is using this infrequently Will need ongoing conversations with new PCP about appropriate treatments

## 2021-01-02 ENCOUNTER — Inpatient Hospital Stay: Payer: Medicare Other | Admitting: Occupational Therapy

## 2021-01-02 ENCOUNTER — Other Ambulatory Visit: Payer: Self-pay

## 2021-01-02 DIAGNOSIS — I890 Lymphedema, not elsewhere classified: Secondary | ICD-10-CM

## 2021-01-02 NOTE — Therapy (Signed)
[facility] [address] [place], [place], [place], [address] Phone: [phone]   Fax:  [phone]  Occupational Therapy Screen:  Patient Details  Name: Sabrina Morris MRN: [id number] Date of Birth: [birth year] No data recorded  Encounter Date: 01/02/2021   OT End of Session - 01/02/21 2122     Visit Number 0             Past Medical History:  Diagnosis Date   Adrenal disorder ([place])    Anemia 11/2018   Breast cancer ([place])    H/O bone density study 2015   H/O colonoscopy    sch 06/12/15   H/O colonoscopy 11/2018   H/O endoscopy 11/2018   upper   Lymphedema    Lymphedema    Migraine    Pap smear for cervical cancer screening [id number]    Past Surgical History:  Procedure Laterality Date   breast  Left 02/18/2017   Implant removed   BREAST ENHANCEMENT SURGERY Bilateral 2005   [place] Right 02/2011   PORT A CATH INJECTION ([place] [facility])  2013    There were no vitals filed for this visit.   Subjective Assessment - 01/02/21 2120     Subjective  Since I have seen you years ago - I am now on CPAP -but the mask do not fit on my face with my compression garment on face and head, and the nose piece - my neck feels more swollen and fibrotic because of mask wearing with COVID I could not get in to see Hassan Rowan to work on my head and neck lymphedema            DR [facility] NOTE FROM 12/24/20: Iron deficiency anemia due to chronic blood loss # History Severe iron deficient anemia-question blood loss appears chronic MCV- resolved s/p since fundoplication. Today hemoglobin is 15.7- STABLE.    # Erythrocytosis secondary- ? Diuretics/ sleep apnea-difficulty wearing CPAP [issues Lymphedema]- [JAK-2/exon -12- NEG]-- STABLE.   # Lymphedema chest wall-stable sec to breast surgery/? Surgery- on PT; on lasix.  Given the difficulty wearing the CPAP machine will  refer to [facility].  Patient previously seen [name].   # History of breast cancer RIght -stable on surveillance-mammogram left breast unilateral-August 2022 []    # Bloating- awaiting GI with [facility].    # DISPOSITION: # HOLD IV Iron today # refer to [name] re: lympedema  # follow up in 6 months; MD; labs- cbc/cmp;iron studies/ferritin; possible venofer-Dr.B   Cc; Dr.Gudena   All questions were answered. The patient knows to call the clinic with any problems, questions or concerns.    Cammie Sickle, MD 12/24/2020 1:57 PM   OT SCREEN 01/02/21:    This OT very familiar with this pt from 2013 thru 2018 - pt had R breast Cancer and then developed R UE and thoracic lymphedema first , then bilateral LE lymphedema and then L UE lymphedema.  After which she developed neck and head lymphedema. Pt have several daytime compression sleeves and night time compression sleeves as well as head and neck compression. She has thoracic Jovipak breast pads and Jovipak compression for neck  She has Flexitouch pump for bilateral LE and UE - as well as head and neck. Pt report during [place] she could not get in with massage therapist that did her head and neck lymphatic decongesting -and now with her on CPAP machine -she  cannot tolerate wearing it and not fitting.  She has increase lymphedema in neck and fibrosis under her chin and jaw line- fibrosis and tender  Would recommend getting  Flexiouch Rep to check on her  pump garments,  fit, use of it. As well as use of Jovipak/Reidsleeve hotspot or chip bags  at her neck under her head and neck Flexitouch pump. Follow up as needed                                     Visit Diagnosis: Lymphedema, not elsewhere classified    Problem List Patient Active Problem List   Diagnosis Date Noted   Polypharmacy 01/01/2021   Vasomotor symptoms due to menopause 04/17/2020   Fat deposits 07/15/2019   Iron deficiency anemia due  to chronic blood loss 10/26/2018   Adrenal disorder ([place]) 05/05/2017   Postmenopausal 07/02/2015   Hypokalemia 11/30/2014   Accumulation of fluid in tissues 10/03/2014   Hypercholesteremia 10/03/2014   Acquired lymphedema 10/03/2014   Menopausal and perimenopausal disorder 10/03/2014   Borderline diabetes 10/03/2014   Abnormal weight gain 10/03/2014   Ventricular pre-excitation with arrhythmia 10/03/2014   GERD (gastroesophageal reflux disease) 09/22/2014   Anxiety 09/11/2014   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

## 2021-01-09 IMAGING — CT CT ABD-PELV W/ CM
2 of 5 series · 16 of 46 positions shown, 18 images · IV contrast (omnipaque)
Comparison: PET-CT dated 09/02/2017

CLINICAL DATA: Worsening fatigue. Iron deficiency anemia. History
of right breast cancer status post mastectomy chemotherapy, and
radiation.

EXAM:
CT ABDOMEN AND PELVIS WITH CONTRAST
TECHNIQUE: Multidetector CT imaging of the abdomen and pelvis was performed
using the standard protocol following bolus administration of
intravenous contrast.
CONTRAST:  100mL OMNIPAQUE IOHEXOL 300 MG/ML  SOLN

[Series 2: abd pelvis · axial · 0.71mm/px · z∈[-1498,-1103]mm · 13 of 91 slices shown, 15 images (1 of 2)]
[im 6/91  soft-tissue]
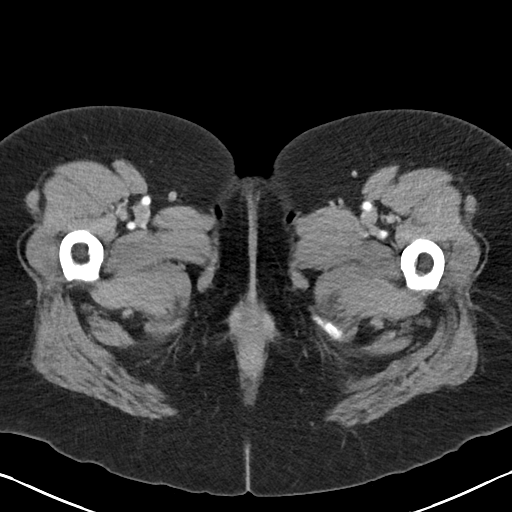
[im 6/91  bone]
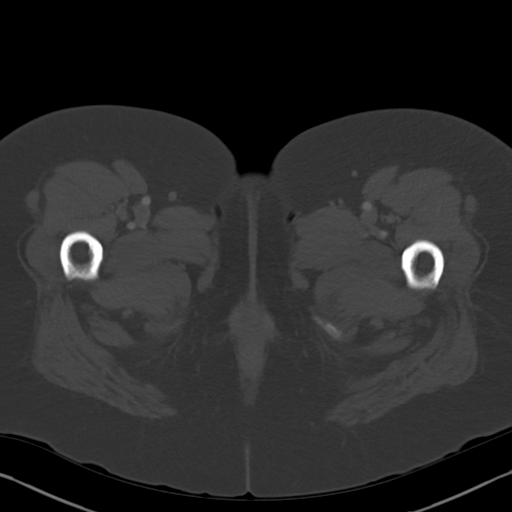
[im 11/91  soft-tissue]
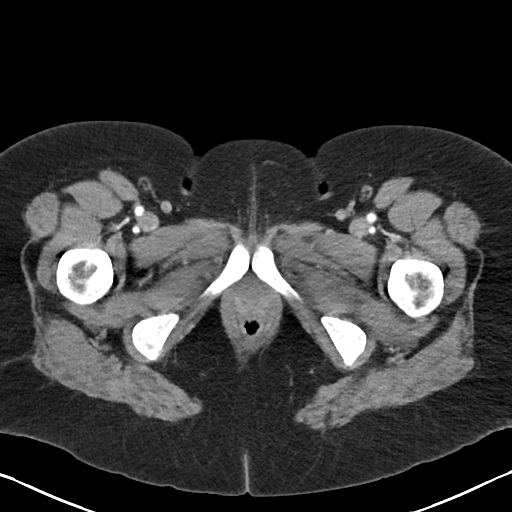
[im 22/91  soft-tissue]
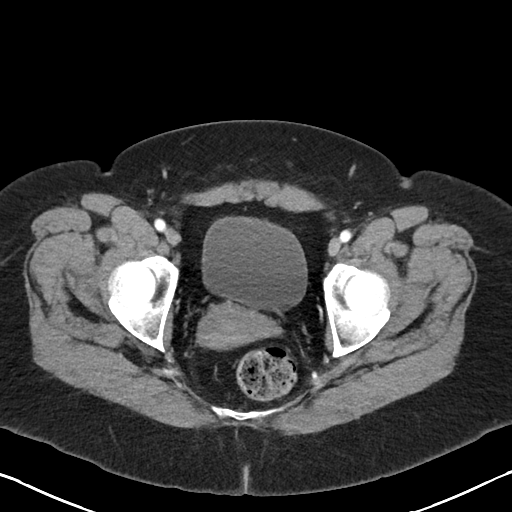
[im 27/91  soft-tissue]
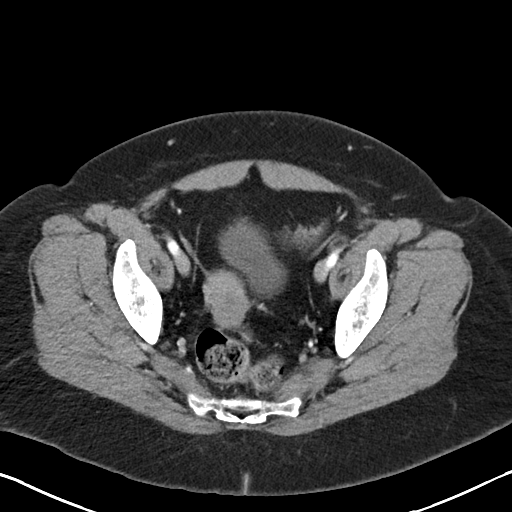
[im 32/91  soft-tissue]
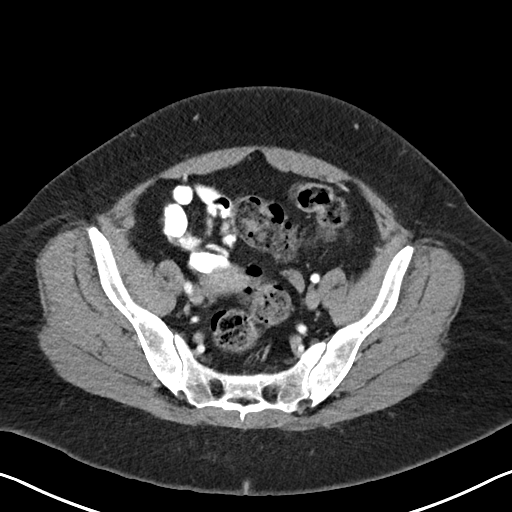
[im 38/91  soft-tissue]
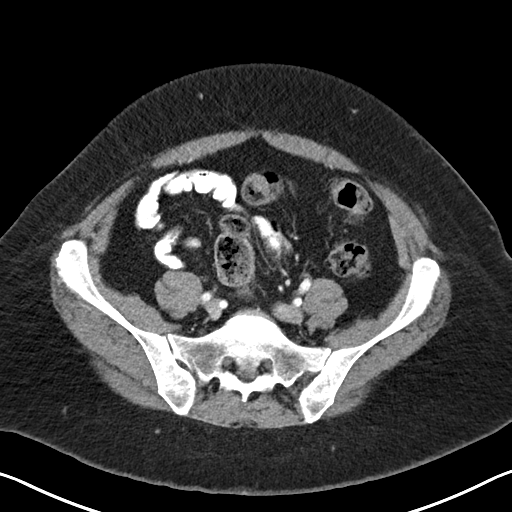
[im 48/91  soft-tissue]
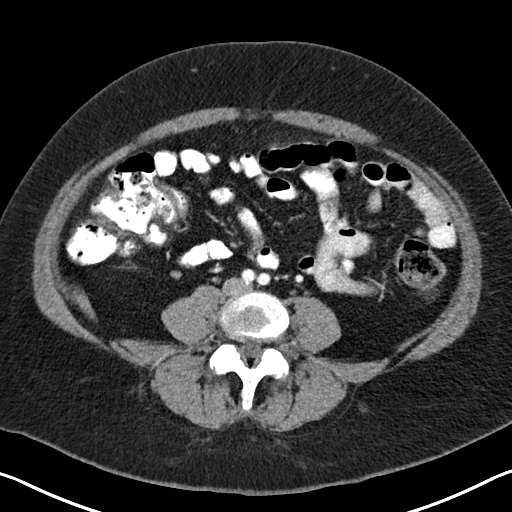
[im 53/91  soft-tissue]
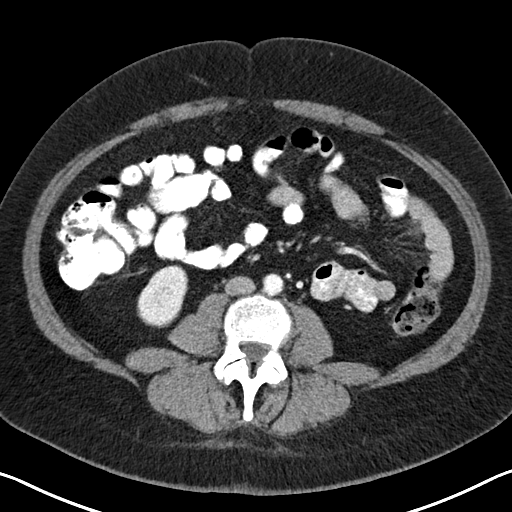
[im 59/91  soft-tissue]
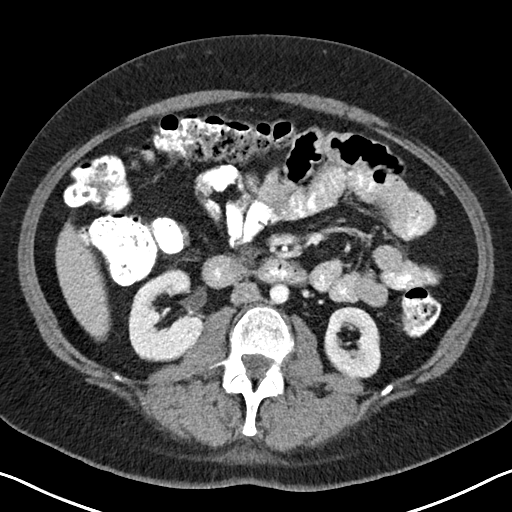
[im 59/91  bone]
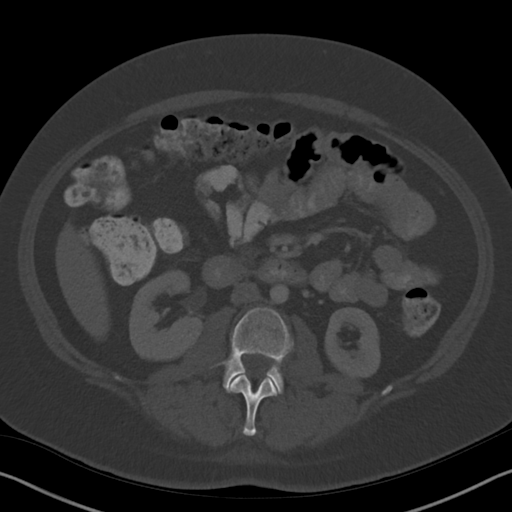
[im 64/91  soft-tissue]
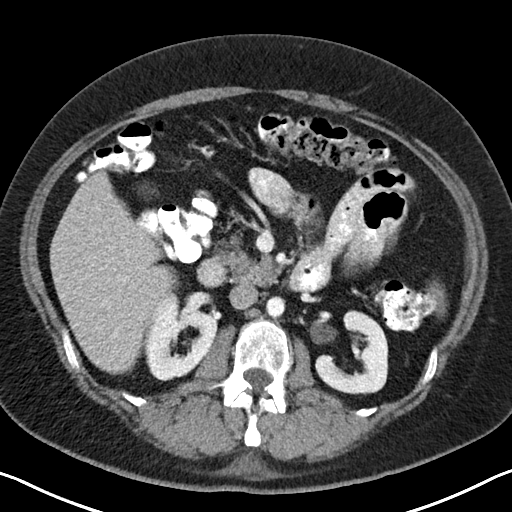
[im 69/91  soft-tissue]
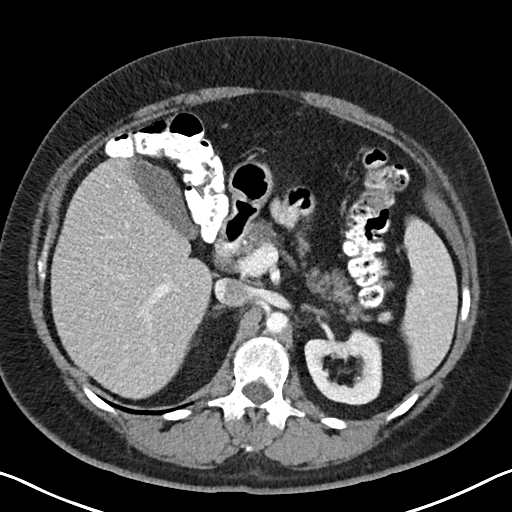
[im 80/91  soft-tissue]
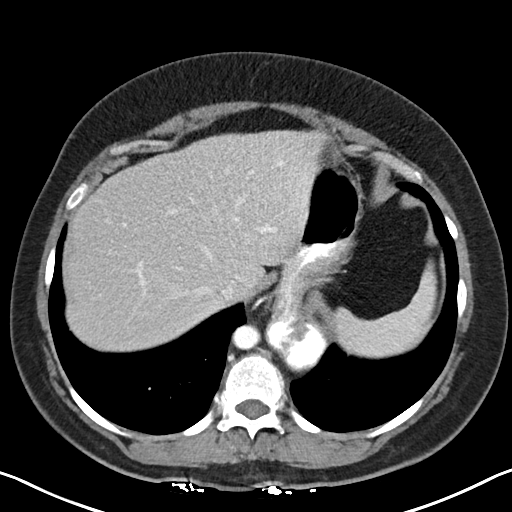
[im 85/91  soft-tissue]
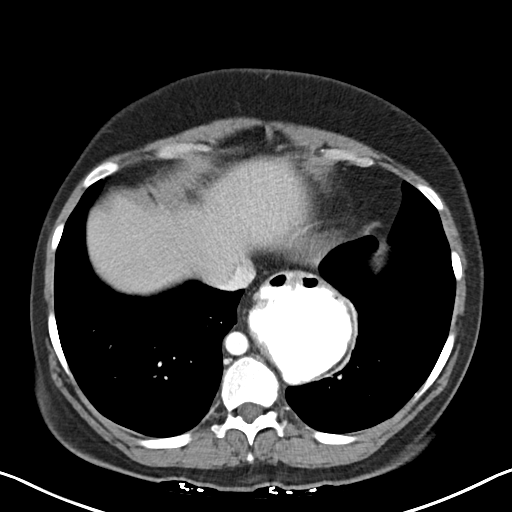

[Series 5: abd pelvis · coronal · 0.71mm/px · 3 of 164 slices shown (2 of 2)]
[im 55/164  soft-tissue]
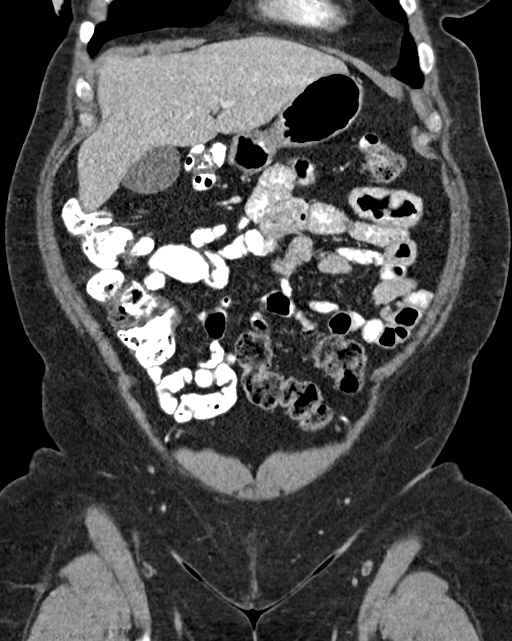
[im 73/164  soft-tissue]
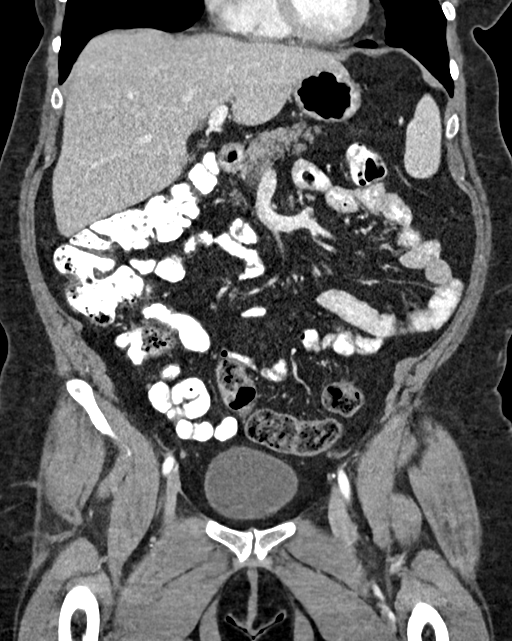
[im 91/164  soft-tissue]
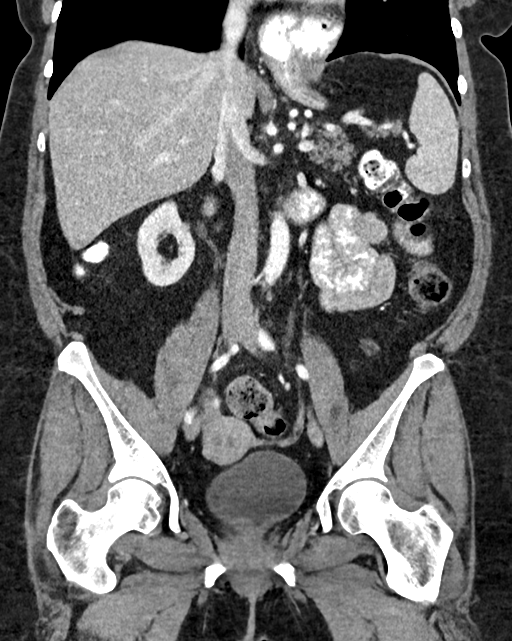

[16 of 46 positions shown; findings below may reference images not displayed]

FINDINGS: Lower chest: Lung bases are clear.  Status post right mastectomy.

Hepatobiliary: Liver is within normal limits.

Gallbladder is unremarkable. No intrahepatic or extrahepatic ductal
dilatation.

Pancreas: Within normal limits.

Spleen: Within normal limits.

Adrenals/Urinary Tract: Adrenal glands are within normal limits.

Kidneys are within normal limits.  No hydronephrosis.

Bladder is within normal limits.

Stomach/Bowel: Large hiatal hernia/inverted intrathoracic stomach.

No evidence of bowel obstruction.

Normal appendix (series 2/image 55).

No colonic wall thickening or mass is evident on CT.

Vascular/Lymphatic: No evidence of abdominal aortic aneurysm.

No suspicious abdominopelvic lymphadenopathy.

Reproductive: Uterus is within normal limits.

Bilateral ovaries are within normal limits.

Other: No abdominopelvic ascites.

Musculoskeletal: Mild superior endplate Schmorl's node deformity at
L1.
IMPRESSION: No colonic wall thickening or mass is evident on CT.

No evidence of metastatic disease in the abdomen/pelvis.

## 2021-01-25 ENCOUNTER — Other Ambulatory Visit: Payer: Self-pay | Admitting: Family Medicine
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
## 2021-01-28 DIAGNOSIS — R140 Abdominal distension (gaseous): Secondary | ICD-10-CM | POA: Diagnosis not present

## 2021-01-28 DIAGNOSIS — R109 Unspecified abdominal pain: Secondary | ICD-10-CM | POA: Diagnosis not present

## 2021-01-29 ENCOUNTER — Other Ambulatory Visit: Payer: Self-pay | Admitting: Family Medicine

## 2021-01-29 DIAGNOSIS — I890 Lymphedema, not elsewhere classified: Secondary | ICD-10-CM
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
## 2021-01-30 ENCOUNTER — Encounter: Payer: Medicare Other | Admitting: Family Medicine

## 2021-01-31 ENCOUNTER — Other Ambulatory Visit: Payer: Self-pay | Admitting: Family Medicine

## 2021-01-31 DIAGNOSIS — G43809 Other migraine, not intractable, without status migrainosus: Secondary | ICD-10-CM
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
## 2021-02-02 ENCOUNTER — Other Ambulatory Visit: Payer: Self-pay | Admitting: Family Medicine

## 2021-02-02 DIAGNOSIS — F419 Anxiety disorder, unspecified: Secondary | ICD-10-CM
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
## 2021-02-21 ENCOUNTER — Encounter: Payer: Self-pay | Admitting: Family Medicine

## 2021-03-04 ENCOUNTER — Telehealth: Payer: Self-pay | Admitting: Family Medicine

## 2021-03-04 DIAGNOSIS — K6389 Other specified diseases of intestine: Secondary | ICD-10-CM | POA: Diagnosis not present

## 2021-03-04 DIAGNOSIS — R635 Abnormal weight gain: Secondary | ICD-10-CM | POA: Diagnosis not present

## 2021-03-04 DIAGNOSIS — K2270 Barrett's esophagus without dysplasia: Secondary | ICD-10-CM | POA: Diagnosis not present

## 2021-03-04 DIAGNOSIS — R1013 Epigastric pain: Secondary | ICD-10-CM | POA: Diagnosis not present

## 2021-03-04 NOTE — Telephone Encounter (Signed)
Pt is wanting to switch her PCP to [facility] from [place]. Wanting CPE apt with Ria Comment. CB- [phone]

## 2021-03-13 ENCOUNTER — Other Ambulatory Visit: Payer: Self-pay | Admitting: Family Medicine

## 2021-03-13 DIAGNOSIS — M5030 Other cervical disc degeneration, unspecified cervical region: Secondary | ICD-10-CM
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
## 2021-03-28 ENCOUNTER — Encounter: Payer: Self-pay | Admitting: Internal Medicine

## 2021-04-11 ENCOUNTER — Ambulatory Visit (INDEPENDENT_AMBULATORY_CARE_PROVIDER_SITE_OTHER): Payer: Medicare Other | Admitting: Physician Assistant

## 2021-04-11 ENCOUNTER — Encounter: Payer: Self-pay | Admitting: Physician Assistant

## 2021-04-11 ENCOUNTER — Other Ambulatory Visit: Payer: Self-pay

## 2021-04-11 VITALS — BP 126/77 | HR 87 | Temp 97.8°F | Ht 65.0 in | Wt 216.7 lb

## 2021-04-11 DIAGNOSIS — R7303 Prediabetes: Secondary | ICD-10-CM | POA: Diagnosis not present

## 2021-04-11 DIAGNOSIS — F419 Anxiety disorder, unspecified: Secondary | ICD-10-CM

## 2021-04-11 DIAGNOSIS — E782 Mixed hyperlipidemia: Secondary | ICD-10-CM | POA: Diagnosis not present

## 2021-04-11 DIAGNOSIS — Z0000 Encounter for general adult medical examination without abnormal findings: Secondary | ICD-10-CM

## 2021-04-11 DIAGNOSIS — K6389 Other specified diseases of intestine: Secondary | ICD-10-CM | POA: Insufficient documentation

## 2021-04-11 DIAGNOSIS — G43809 Other migraine, not intractable, without status migrainosus: Secondary | ICD-10-CM

## 2021-04-11 DIAGNOSIS — G4701 Insomnia due to medical condition: Secondary | ICD-10-CM

## 2021-04-11 DIAGNOSIS — I972 Postmastectomy lymphedema syndrome: Secondary | ICD-10-CM | POA: Diagnosis not present

## 2021-04-11 DIAGNOSIS — D582 Other hemoglobinopathies: Secondary | ICD-10-CM | POA: Diagnosis not present

## 2021-04-11 DIAGNOSIS — M5030 Other cervical disc degeneration, unspecified cervical region: Secondary | ICD-10-CM

## 2021-04-11 DIAGNOSIS — K638219 Small intestinal bacterial overgrowth, unspecified: Secondary | ICD-10-CM | POA: Insufficient documentation

## 2021-04-11 MED ORDER — OXYCODONE-ACETAMINOPHEN 5-325 MG PO TABS: 1.0000 | ORAL_TABLET | Freq: Three times a day (TID) | ORAL | 0 refills | Status: DC | PRN

## 2021-04-11 MED ORDER — LORAZEPAM 1 MG PO TABS: 1.0000 mg | ORAL_TABLET | Freq: Two times a day (BID) | ORAL | 0 refills | Status: DC | PRN

## 2021-04-11 MED ORDER — BUTALBITAL-APAP-CAFFEINE 50-325-40 MG PO TABS: 1.0000 | ORAL_TABLET | Freq: Two times a day (BID) | ORAL | 0 refills | Status: DC | PRN

## 2021-04-11 NOTE — Progress Notes (Signed)
I,Sabrina Morris,acting as a Education administrator for [employer], PA-C.,have documented all relevant documentation on the behalf of Sabrina Kirschner, PA-C,as directed by  Sabrina Kirschner, PA-C while in the presence of Sabrina Kirschner, PA-C.   Complete physical exam   Patient: Sabrina Morris   DOB: 03-Sep-1965   56 y.o. Female  MRN: [id number] Visit Date: 04/11/2021  Today's healthcare provider: [name], PA-C   Cc. cpe  Subjective    Sabrina Morris is a 56 y.o. female who presents today for a complete physical exam.  She reports consuming a  no lactose and watches meat  diet. Gym/ health club routine includes yoga. She generally feels well. She reports sleeping poorly. She does not have additional problems to discuss today.  HPI  Recent addition to medical history -- had a positive at home SIBO test, being treated by GI  Chronic pain secondary to musculoskeletal issues and lymphedema --Takes 7.5 mg of Meloxicam at night, 600+ ibuprofen daily. During a lymph 'flare' which happens usually once a week, will take 1/4 to 1/2 of Percocet and two meloxicam. --Takes lasix 40 mg daily, usually 30 mg in AM and 10 mg in PM, without, lymphedema pumps do not work efficiently  Migraines --Usually three a week --takes Fioricet preventatively if feels one coming --takes 1/2 ativan occasionally at night to help get to sleep and hopefully prevent a migraine --takes maxalt during a migraine which works if she takes it in time   Past Medical History:  Diagnosis Date   Adrenal disorder ([place])    Anemia 11/2018   Breast cancer ([place])    H/O bone density study 2015   H/O colonoscopy    sch 06/12/15   H/O colonoscopy 11/2018   H/O endoscopy 11/2018   upper   Lymphedema    Lymphedema    Migraine    Pap smear for cervical cancer screening [id number]   Past Surgical History:  Procedure Laterality Date   breast  Left 02/18/2017   Implant removed   BREAST ENHANCEMENT SURGERY Bilateral 2005   [place] Right 02/2011   PORT A CATH INJECTION ([place] [facility])  2013   Social History   Socioeconomic History   Marital status: Divorced    Spouse name: Not on file   Number of children: 0   Years of education: Not on file   Highest education level: Associate degree: occupational, Hotel manager, or vocational program  Occupational History   Occupation: Therapist, sports    Comment: part time   Occupation: disability  Tobacco Use   Smoking status: Never   Smokeless tobacco: Never  Vaping Use   Vaping Use: Never used  Substance and Sexual Activity   Alcohol use: No   Drug use: No   Sexual activity: Not Currently    Birth control/protection: Post-menopausal  Other Topics Concern   Not on file  Social History Narrative   Disability [sec to [place]; 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  No smoking or alcohol. Lives at home/with sister.    Social Determinants of Health   Financial Resource Strain: Medium Risk   Difficulty of Paying Living Expenses: Somewhat hard  Food Insecurity: No Food Insecurity   Worried About Charity fundraiser in the Last Year: Never true   Ran Out of Food in the Last Year: Never true  Transportation Needs: No Transportation Needs   Lack of Transportation (  Medical): No   Lack of Transportation (Non-Medical): No  Physical Activity: Inactive   Days of Exercise per Week: 0 days   Minutes of Exercise per Session: 0 min  Stress: Stress Concern Present   Feeling of Stress : Rather much  Social Connections: Socially Isolated   Frequency of Communication with Friends and Family: More than three times a week   Frequency of Social Gatherings with Friends and Family: Three times a week   Attends Religious Services: Never   Active Member of Clubs or Organizations: No   Attends Music therapist: Never   Marital Status: Divorced  Human resources officer Violence: Not At Risk   Fear of Current or  Ex-Partner: No   Emotionally Abused: No   Physically Abused: No   Sexually Abused: No   Family Status  Relation Name Status   Sister  Alive   Family History  Adopted: Yes  Problem Relation Age of Onset   Breast cancer Sister 36       1/2 sister; Diplomatic Services operational officer neg at [employer]   Allergies  Allergen Reactions   Sumatriptan Other (See Comments) and Shortness Of Breath   Feraheme [Ferumoxytol] Nausea Only    Nausea fatigue 1 hour post infusion   Haemophilus Influenzae Vaccines    Other Swelling    INFLUENZA   Sulfa Antibiotics Other (See Comments)    GI distress   Sulfamethoxazole Other (See Comments)   Tamoxifen Other (See Comments)    Edema, excess fluid   Zoladex  [Goserelin]    Codeine Other (See Comments) and Rash    Hyper, increased heart rate   Hydrocodone-Acetaminophen Rash    Hyperactivity   Topiramate Other (See Comments)    "Messed with mind"    Patient Care Team: Sabrina Kirschner, PA-C as PCP - General (Physician Assistant) Ellwood Handler., MD (Gastroenterology) Pa, [place] (Obstetrics and Gynecology)   Medications: Outpatient Medications Prior to Visit  Medication Sig   calcium carbonate 100 mg/ml SUSP Take by mouth.   Calcium-Magnesium-Vitamin D [phone] MG-MG-UNIT TABS Take by mouth. Alternates between 1 tablet once daily and two tablets daily.   Cholecalciferol (VITAMIN D3) 5000 units TABS Take by mouth daily at 6 (six) AM.   cyclobenzaprine (FLEXERIL) 5 MG tablet TAKE 1 TABLET BY MOUTH 3 TIMES A DAY AS NEEDED   docusate sodium (COLACE) 100 MG capsule Take by mouth daily as needed.   fluocinonide (LIDEX) 0.05 % external solution Apply 1 application topically 2 (two) times daily.   fluticasone-salmeterol (WIXELA INHUB) 250-50 MCG/ACT AEPB Inhale 1 puff into the lungs in the morning and at bedtime.   furosemide (LASIX) 20 MG tablet TAKE 2 TABLETS BY MOUTH EVERY DAY   melatonin 3 MG TABS tablet Take 3 mg by mouth  at bedtime.   meloxicam (MOBIC) 15 MG tablet Take 1 tablet (15 mg total) by mouth daily.   Multiple Vitamin (MULTIVITAMIN) capsule Take 1 capsule by mouth daily.   nystatin ointment (MYCOSTATIN) Apply 1 application topically 2 (two) times daily. At mastectomy site as needed   phentermine (ADIPEX-P) 37.5 MG tablet Take 1 tablet (37.5 mg total) by mouth daily before breakfast.   potassium chloride (KLOR-CON) 10 MEQ tablet Take 1 tablet (10 mEq total) by mouth daily.   promethazine (PHENERGAN) 12.5 MG tablet TAKE 1 TABLET BY MOUTH EVERY 6 HOURS AS NEEDED FOR NAUSEA FOR UP TO 7 DAYS   promethazine (PHENERGAN) 25 MG tablet Take 1 tablet (25  mg total) by mouth every 8 (eight) hours as needed for nausea or vomiting.   rizatriptan (MAXALT) 10 MG tablet TAKE 1 TABLET BY MOUTH AT FIRST SIGN OF MIGRAINE SYMPTOMS. IF NO RELIEF, A SECOND TABLET MAY BE TAKEN IN 2 HOURS. LIMIT OF 2 TABLETS PER DAY   valACYclovir (VALTREX) 500 MG tablet TAKE 1 TABLET BY MOUTH 2 TIMES DAILY AS NEEDED FOR FEVER BLISTERS   [DISCONTINUED] butalbital-acetaminophen-caffeine (FIORICET) 50-325-40 MG tablet TAKE 1 TO 2 TABLETS BY MOUTH EVERY 6 HOURS AS NEEDED FOR HEADACHE   [DISCONTINUED] LORazepam (ATIVAN) 1 MG tablet TAKE 1 TABLET BY MOUTH TWICE A DAY AS NEEDED   [DISCONTINUED] oxyCODONE-acetaminophen (PERCOCET/ROXICET) 5-325 MG tablet Take 1 tablet by mouth every 8 (eight) hours as needed for severe pain.   No facility-administered medications prior to visit.    Review of Systems  Constitutional:  Positive for fatigue.  HENT:  Positive for facial swelling.   Eyes:  Positive for redness.  Respiratory:  Positive for shortness of breath.   Cardiovascular:  Positive for palpitations and leg swelling.  Gastrointestinal:  Positive for constipation, nausea and vomiting.  Endocrine: Positive for heat intolerance.  Genitourinary: Negative.   Musculoskeletal:  Positive for arthralgias, back pain, neck pain and neck stiffness.  Skin:  Negative.   Allergic/Immunologic: Negative.   Neurological:  Positive for headaches.  Hematological: Negative.   Psychiatric/Behavioral: Negative.       Objective    Blood pressure 126/77, pulse 87, temperature 97.8 F (36.6 C), temperature source Oral, height 5\' 5"  (1.651 m), weight 216 lb 11.2 oz (98.3 kg), SpO2 100 %.   Physical Exam Constitutional:      General: She is awake.     Appearance: She is well-developed.  HENT:     Head: Normocephalic.     Right Ear: Tympanic membrane normal.     Left Ear: Tympanic membrane normal.  Eyes:     Conjunctiva/sclera: Conjunctivae normal.     Pupils: Pupils are equal, round, and reactive to light.  Neck:     Thyroid: No thyroid mass or thyromegaly.     Comments: Bilateral cervical mildly enlarged LN, all mobile and soft Cardiovascular:     Rate and Rhythm: Normal rate and regular rhythm.     Heart sounds: Normal heart sounds.  Pulmonary:     Effort: Pulmonary effort is normal.     Breath sounds: Normal breath sounds.  Abdominal:     Palpations: Abdomen is soft.     Tenderness: There is no abdominal tenderness.  Musculoskeletal:     Right lower leg: No swelling. Edema present.     Left lower leg: No swelling. Edema present.     Comments: Lymphedema is worse today L>R upper extremity Equal b/l lower extremities  Lymphadenopathy:     Cervical: Cervical adenopathy present.  Skin:    General: Skin is warm.  Neurological:     Mental Status: She is alert and oriented to person, place, and time.  Psychiatric:        Attention and Perception: Attention normal.        Mood and Affect: Mood normal.        Speech: Speech normal.        Behavior: Behavior is cooperative.     Last depression screening scores PHQ 2/9 Scores 04/11/2021 01/01/2021 06/18/2020  PHQ - 2 Score 1 0 0  PHQ- 9 Score 8 8 -   Last fall risk screening Fall Risk  04/11/2021  Falls in  the past year? 1  Number falls in past yr: 1  Injury with Fall? 0  Risk for  fall due to : History of fall(s)  Follow up -   Last Audit-C alcohol use screening Alcohol Use Disorder Test (AUDIT) 04/11/2021  1. How often do you have a drink containing alcohol? 0  2. How many drinks containing alcohol do you have on a typical day when you are drinking? 0  3. How often do you have six or more drinks on one occasion? 0  AUDIT-C Score 0  Alcohol Brief Interventions/Follow-up -   A score of 3 or more in women, and 4 or more in men indicates increased risk for alcohol abuse, EXCEPT if all of the points are from question 1   No results found for any visits on 04/11/21.  Assessment & Plan    Routine Health Maintenance and Physical Exam  Exercise Activities and Dietary recommendations  Goals      Prevent falls     Recommend to remove any items from the home that may cause slips or trips.         There is no immunization history on file for this patient.  Health Maintenance  Topic Date Due   PAP SMEAR-Modifier  05/29/2021   COVID-19 Vaccine (1) 04/27/2021 (Originally 06/20/1966)   INFLUENZA VACCINE  06/14/2021 (Originally 10/15/2020)   TETANUS/TDAP  06/18/2021 (Originally 12/21/1983)   Zoster Vaccines- Shingrix (1 of 2) 07/10/2021 (Originally 12/21/1983)   MAMMOGRAM  10/24/2021   COLONOSCOPY (Pts 45-45yrs Insurance coverage will need to be confirmed)  12/01/2021   HIV Screening  Completed   HPV VACCINES  Aged Out    Discussed health benefits of physical activity, and encouraged her to engage in regular exercise appropriate for her age and condition.  Problem List Items Addressed This Visit       Cardiovascular and Mediastinum   Headache, migraine    She has not tolerated injections, topamax, or propranolol previously Cannot receive injections as will considerably flare lymphedema Advised to consult with neuro for any further suggestions Current management: Fioricet almost daily  And 1/2 ativan nightly for prevention, maxalt for migraine abortive, and  phenergen if symptoms progress to nausea      Relevant Medications   oxyCODONE-acetaminophen (PERCOCET/ROXICET) 5-325 MG tablet   butalbital-acetaminophen-caffeine (FIORICET) 50-325-40 MG tablet   Other Relevant Orders   Ambulatory referral to Neurology     Other   Anxiety   Relevant Medications   LORazepam (ATIVAN) 1 MG tablet   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    Currently manages w/ home devices, seeing manual therapist (out of pocket) monthly Self massage 40 mg lasix daily  During flares, experiences pain spikes unmanaged by ibuprofen and meloxicam Had tried trial of percocet, finds this manages the pain best, takes at max 1/2 tab once maybe twice a week. Was sent 15 pills 10/22 but on PDMP looks like her last fill was 11/14/19 and she reports she still has two pills left.  Okay with her taking 1/4 to 1/2 percocet during flares to try to reduce NSAID use      Insomnia due to medical condition    Secondary to wearing lymphedema pumps, wraps, and cpap machine at night. Very difficult to get comfortable, and the tension she feels often leads to migraines in the morning She has found 1/2 of ativan allows her to relax enough to get decent sleep. Does not take nightly. PDMP checked last fill was 08/15/20  Relevant Medications   LORazepam (ATIVAN) 1 MG tablet   Other Visit Diagnoses     Encounter for physical examination    -  Primary   Relevant Orders   HgB A1c   Lipid Profile   CBC   Comprehensive Metabolic Panel (CMET)   Elevated hemoglobin (HCC)       Relevant Orders   CBC   Mixed hyperlipidemia       Relevant Orders   Lipid Profile   Comprehensive Metabolic Panel (CMET)   Prediabetes       Relevant Orders   HgB A1c   DDD (degenerative disc disease), cervical       Relevant Medications   oxyCODONE-acetaminophen (PERCOCET/ROXICET) 5-325 MG tablet   butalbital-acetaminophen-caffeine (FIORICET) 50-325-40 MG tablet       Due for pap, will schedule w/  gyn. Declines shingles, unable to get vaccines due to dramatic lymphedema flares   Return in about 6 months (around 10/09/2021).     I, Sabrina Kirschner, PA-C have reviewed all documentation for this visit. The documentation on  04/11/2021 for the exam, diagnosis, procedures, and orders are all accurate and complete.    Sabrina Kirschner, PA-C  [facility] [phone] (phone) [phone] (fax)  [place]

## 2021-04-11 NOTE — Assessment & Plan Note (Signed)
She has not tolerated injections, topamax, or propranolol previously Cannot receive injections as will considerably flare lymphedema Advised to consult with neuro for any further suggestions Current management: Fioricet almost daily  And 1/2 ativan nightly for prevention, maxalt for migraine abortive, and phenergen if symptoms progress to nausea

## 2021-04-11 NOTE — Assessment & Plan Note (Addendum)
Currently manages w/ home devices, seeing manual therapist (out of pocket) monthly. This is extremely detrimental to her quality of life, but has managed as best as she can for 10 + years. Self massage 40 mg lasix daily  During flares, experiences pain spikes unmanaged by ibuprofen and meloxicam Had tried trial of percocet, finds this manages the pain best, takes at max 1/2 tab once maybe twice a week. Was sent 15 pills 10/22 but on PDMP looks like her last fill was 11/14/19 and she reports she still has two pills left.  Okay with her taking 1/4 to 1/2 percocet during flares to try to reduce NSAID use

## 2021-04-11 NOTE — Assessment & Plan Note (Signed)
Secondary to wearing lymphedema pumps, wraps, and cpap machine at night. Very difficult to get comfortable, and the tension she feels often leads to migraines in the morning She has found 1/2 of ativan allows her to relax enough to get decent sleep. Does not take nightly. PDMP checked last fill was 08/15/20

## 2021-04-11 NOTE — Patient Instructions (Signed)

## 2021-04-12 LAB — COMPREHENSIVE METABOLIC PANEL
ALT: 21 IU/L (ref 0–32)
AST: 16 IU/L (ref 0–40)
Albumin/Globulin Ratio: 2.2 (ref 1.2–2.2)
Albumin: 4.8 g/dL (ref 3.8–4.9)
Alkaline Phosphatase: 109 IU/L (ref 44–121)
BUN/Creatinine Ratio: 22 (ref 9–23)
BUN: 19 mg/dL (ref 6–24)
Bilirubin Total: 0.3 mg/dL (ref 0.0–1.2)
CO2: 25 mmol/L (ref 20–29)
Calcium: 9.5 mg/dL (ref 8.7–10.2)
Chloride: 102 mmol/L (ref 96–106)
Creatinine, Ser: 0.87 mg/dL (ref 0.57–1.00)
Globulin, Total: 2.2 g/dL (ref 1.5–4.5)
Glucose: 127 mg/dL — ABNORMAL HIGH (ref 70–99)
Potassium: 4.8 mmol/L (ref 3.5–5.2)
Sodium: 145 mmol/L — ABNORMAL HIGH (ref 134–144)
Total Protein: 7 g/dL (ref 6.0–8.5)
eGFR: 78 mL/min/{1.73_m2} (ref >59)

## 2021-04-12 LAB — LIPID PANEL
Chol/HDL Ratio: 4.5 ratio — ABNORMAL HIGH (ref 0.0–4.4)
Cholesterol, Total: 206 mg/dL — ABNORMAL HIGH (ref 100–199)
HDL: 46 mg/dL (ref >39)
LDL Chol Calc (NIH): 133 mg/dL — ABNORMAL HIGH (ref 0–99)
Triglycerides: 151 mg/dL — ABNORMAL HIGH (ref 0–149)
VLDL Cholesterol Cal: 27 mg/dL (ref 5–40)

## 2021-04-12 LAB — CBC
Hematocrit: 46.8 % — ABNORMAL HIGH (ref 34.0–46.6)
Hemoglobin: 15.9 g/dL (ref 11.1–15.9)
MCH: 30.5 pg (ref 26.6–33.0)
MCHC: 34 g/dL (ref 31.5–35.7)
MCV: 90 fL (ref 79–97)
Platelets: 244 10*3/uL (ref 150–450)
RBC: 5.22 x10E6/uL (ref 3.77–5.28)
RDW: 12.5 % (ref 11.7–15.4)
WBC: 6.5 10*3/uL (ref 3.4–10.8)

## 2021-04-12 LAB — HEMOGLOBIN A1C
Est. average glucose Bld gHb Est-mCnc: 128 mg/dL
Hgb A1c MFr Bld: 6.1 % — ABNORMAL HIGH (ref 4.8–5.6)

## 2021-04-24 ENCOUNTER — Ambulatory Visit: Payer: Medicare Other | Admitting: Psychiatry

## 2021-04-24 ENCOUNTER — Encounter: Payer: Self-pay | Admitting: Psychiatry

## 2021-04-24 VITALS — BP 166/104 | HR 104 | Ht 65.0 in | Wt 218.0 lb

## 2021-04-24 DIAGNOSIS — M542 Cervicalgia: Secondary | ICD-10-CM | POA: Diagnosis not present

## 2021-04-24 DIAGNOSIS — G43119 Migraine with aura, intractable, without status migrainosus: Secondary | ICD-10-CM | POA: Diagnosis not present

## 2021-04-24 MED ORDER — NURTEC 75 MG PO TBDP: 75.0000 mg | ORAL_TABLET | ORAL | 0 refills | Status: DC | PRN

## 2021-04-24 MED ORDER — NURTEC 75 MG PO TBDP: 75.0000 mg | ORAL_TABLET | ORAL | 3 refills | Status: DC

## 2021-04-24 NOTE — Patient Instructions (Addendum)
Physical therapy for the neck Try Nurtec as needed for migraines. If this is helpful you can take it every other day for headache prevention

## 2021-04-24 NOTE — Progress Notes (Signed)
Referring:  [name], PA-C [address] [place],  [place] [address]  PCP: [name], PA-C  Neurology was asked to evaluate Sabrina Morris, a 56 year old female for a chief complaint of headaches.  Our recommendations of care will be communicated by shared medical record.    CC:  headaches  HPI:  Medical co-morbidities: HLD, prediabetes, cervical degenerative disc disease, lymphedema, breast cancer (dx in 2012, s/p chemo now in remission), OSA, Wolf-Parkinson White s/p ablation  The patient presents for evaluation of migraines which began when she was in her 42s. She is currently having migraines 3-4 times per week. They are described as bifrontal or occipital pounding with associated photophobia, phonophobia, nausea, and occasional vomiting. She does have a visual aura with her more severe migraines.  Currently she takes Maxalt and phenergan as needed which do help resolve her headaches. She also takes Fioricet every night to prevent migraines.  Doesn't sleep well at night because she has to wear a mask for her lymphedema. Suspects this is contributing to her migraines because she has to pull it tight around her neck, which causes significant neck tension.  She is hesitant to try any injectable medications because she has had severe flare ups of lymphedema with prior injections.  Headache History: Onset: age 56 Triggers: none Aura: visual aura Location: bifrontal, occipital Quality/Description: pounding Associated Symptoms:  Photophobia: yes  Phonophobia: yes  Nausea: yes  Vomiting: yes Worse with activity?: yes Duration of headaches: 2 hours  Headache days per month: 16 Headache free days per month: 14  Current Treatment: Abortive Maxalt Fioricet  Preventative none  Prior Therapies                                 Flexeril 5 mg PRN Phenergan 12.5 mg PRN Maxalt 10 mg PRN Fioricet Propranolol - hypotension Verapamil - hypotension Topamax - brain  fog, cognitive slowing Prozac - suicidal ideation Zoloft - palpitations Citalopram - felt unwell Lexapro - weight gain  Headache Risk Factors: Headache risk factors and/or co-morbidities (+) Neck Pain (+) Sleep Disorder - OSA  LABS: CBC Latest Ref Rng & Units 04/11/2021 12/24/2020 07/31/2020  WBC 3.4 - 10.8 x10E3/uL 6.5 7.9 8.4  Hemoglobin 11.1 - 15.9 g/dL 15.9 15.7(H) 17.2(H)  Hematocrit 34.0 - 46.6 % 46.8(H) 46.2(H) 51.5(H)  Platelets 150 - 450 x10E3/uL 244 261 297   CMP Latest Ref Rng & Units 04/11/2021 12/24/2020 06/14/2020  Glucose 70 - 99 mg/dL 127(H) 106(H) 100(H)  BUN 6 - 24 mg/dL 19 21(H) 24(H)  Creatinine 0.57 - 1.00 mg/dL 0.87 0.88 0.79  Sodium 134 - 144 mmol/L 145(H) 134(L) 136  Potassium 3.5 - 5.2 mmol/L 4.8 3.9 3.8  Chloride 96 - 106 mmol/L 102 102 102  CO2 20 - 29 mmol/L 25 25 23   Calcium 8.7 - 10.2 mg/dL 9.5 9.1 8.8(L)  Total Protein 6.0 - 8.5 g/dL 7.0 7.9 -  Total Bilirubin 0.0 - 1.2 mg/dL 0.3 0.4 -  Alkaline Phos 44 - 121 IU/L 109 84 -  AST 0 - 40 IU/L 16 18 -  ALT 0 - 32 IU/L 21 22 -    IMAGING:  Reportedly had normal head imaging in 2017  Current Outpatient Medications on File Prior to Visit  Medication Sig Dispense Refill   butalbital-acetaminophen-caffeine (FIORICET) 50-325-40 MG tablet Take 1-2 tablets by mouth 2 (two) times daily as needed for headache. 60 tablet 0  calcium carbonate 100 mg/ml SUSP Take by mouth.     Calcium-Magnesium-Vitamin D [phone] MG-MG-UNIT TABS Take by mouth. Alternates between 1 tablet once daily and two tablets daily.     Cholecalciferol (VITAMIN D3) 5000 units TABS Take by mouth daily at 6 (six) AM.     cyclobenzaprine (FLEXERIL) 5 MG tablet TAKE 1 TABLET BY MOUTH 3 TIMES A DAY AS NEEDED 90 tablet 1   fluocinonide (LIDEX) 0.05 % external solution Apply 1 application topically 2 (two) times daily. 60 mL 3   fluticasone-salmeterol (WIXELA INHUB) 250-50 MCG/ACT AEPB Inhale 1 puff into the lungs in the morning and at  bedtime. 60 each 2   furosemide (LASIX) 20 MG tablet TAKE 2 TABLETS BY MOUTH EVERY DAY 60 tablet 5   LORazepam (ATIVAN) 1 MG tablet Take 1 tablet (1 mg total) by mouth 2 (two) times daily as needed. 30 tablet 0   meloxicam (MOBIC) 15 MG tablet Take 1 tablet (15 mg total) by mouth daily. 30 tablet 3   Multiple Vitamin (MULTIVITAMIN) capsule Take 1 capsule by mouth daily.     nystatin ointment (MYCOSTATIN) Apply 1 application topically 2 (two) times daily. At mastectomy site as needed     oxyCODONE-acetaminophen (PERCOCET/ROXICET) 5-325 MG tablet Take 1 tablet by mouth every 8 (eight) hours as needed for severe pain. 15 tablet 0   phentermine (ADIPEX-P) 37.5 MG tablet Take 1 tablet (37.5 mg total) by mouth daily before breakfast. 15 tablet 0   potassium chloride (KLOR-CON) 10 MEQ tablet Take 1 tablet (10 mEq total) by mouth daily. 90 tablet 1   promethazine (PHENERGAN) 12.5 MG tablet TAKE 1 TABLET BY MOUTH EVERY 6 HOURS AS NEEDED FOR NAUSEA FOR UP TO 7 DAYS 28 tablet 0   promethazine (PHENERGAN) 25 MG tablet Take 1 tablet (25 mg total) by mouth every 8 (eight) hours as needed for nausea or vomiting. 20 tablet 3   rizatriptan (MAXALT) 10 MG tablet TAKE 1 TABLET BY MOUTH AT FIRST SIGN OF MIGRAINE SYMPTOMS. IF NO RELIEF, A SECOND TABLET MAY BE TAKEN IN 2 HOURS. LIMIT OF 2 TABLETS PER DAY 30 tablet 2   valACYclovir (VALTREX) 500 MG tablet TAKE 1 TABLET BY MOUTH 2 TIMES DAILY AS NEEDED FOR FEVER BLISTERS 30 tablet 5   No current facility-administered medications on file prior to visit.     Allergies: Allergies  Allergen Reactions   Sumatriptan Other (See Comments) and Shortness Of Breath   Feraheme [Ferumoxytol] Nausea Only    Nausea fatigue 1 hour post infusion   Haemophilus Influenzae Vaccines    Other Swelling    INFLUENZA   Sulfa Antibiotics Other (See Comments)    GI distress   Sulfamethoxazole Other (See Comments)   Tamoxifen Other (See Comments)    Edema, excess fluid   Zoladex   [Goserelin]    Codeine Other (See Comments) and Rash    Hyper, increased heart rate   Hydrocodone-Acetaminophen Rash    Hyperactivity   Topiramate Other (See Comments)    "Messed with mind"    Family History: Migraine or other headaches in the family:  niece had migraines  Patient is adopted  Past Medical History: Past Medical History:  Diagnosis Date   Adrenal disorder ([place])    Anemia 11/2018   Breast cancer ([place])    H/O bone density study 2015   H/O colonoscopy    sch 06/12/15   H/O colonoscopy 11/2018   H/O endoscopy 11/2018   upper   Lymphedema  Lymphedema    Migraine    Pap smear for cervical cancer screening [phone]   Postmenopausal 07/02/2015    Past Surgical History Past Surgical History:  Procedure Laterality Date   breast  Left 02/18/2017   Implant removed   BREAST ENHANCEMENT SURGERY Bilateral 2005   [place] Right 02/2011   paraesophageal hernia  2021   PORT A CATH INJECTION ([place] [facility])  2013    Social History: Social History   Tobacco Use   Smoking status: Never   Smokeless tobacco: Never  Vaping Use   Vaping Use: Never used  Substance Use Topics   Alcohol use: No   Drug use: No    ROS: Negative for fevers, chills. Positive for migraines. All other systems reviewed and negative unless stated otherwise in HPI.   Physical Exam:   Vital Signs: BP (!) 166/104    Pulse (!) 104    Ht 5\' 5"  (1.651 m)    Wt 218 lb (98.9 kg)    BMI 36.28 kg/m  GENERAL: well appearing,in no acute distress,alert SKIN:  Color, texture, turgor normal. No rashes or lesions HEAD:  Normocephalic/atraumatic. CV:  RRR RESP: Normal respiratory effort MSK: +tenderness to palpation over bilateral occiput, neck, or shoulders  NEUROLOGICAL: Mental Status: Alert, oriented to person, place and time,Follows commands Cranial Nerves: PERRL,visual fields intact to confrontation,extraocular movements  intact,facial sensation intact,no facial droop or ptosis,hearing grossly intact, no dysarthria Motor: muscle strength 5/5 both upper and lower extremities,no drift, normal tone Reflexes: 2+ throughout Sensation: intact to light touch all 4 extremities Coordination: Finger-to- nose-finger intact bilaterally Gait: normal-based   IMPRESSION: 56 year old female with a history of HLD, prediabetes, cervical degenerative disc disease, lymphedema, breast cancer (dx in 2012, s/p chemo now in remission), OSA, Wolf-Parkinson White s/p ablation who presents for evaluation of chronic migraines. She likely also has a component of medication overuse headache as she is taking Fioricet daily. Discussed importance of limiting rescue medications to avoid rebound headaches. She has failed multiple oral preventive medications previously and has had adverse reactions to injections (severe lymphedema exacerbations). Will start Nurtec every other day for headache prevention.  PLAN: -Preventive: Start Nurtec 75 mg every other day -Rescue: Continue Maxalt 10 mg PRN -Counseled on limiting rescue medications to 2 days per week to avoid rebound headaches -Next steps: Consider Qulipta, Vyepti   I spent a total of 52 minutes chart reviewing and counseling the patient. Headache education was done. Discussed treatment options including preventive and acute medications, natural supplements, and physical therapy. Discussed medication overuse headache and to limit use of acute treatments to no more than 2 days/week or 10 days/month. Discussed medication side effects, adverse reactions and drug interactions. Written educational materials and patient instructions outlining all of the above were given.  Follow-up: 3 months   Genia Harold, MD 04/24/2021   2:59 PM

## 2021-04-25 ENCOUNTER — Encounter: Payer: Self-pay | Admitting: Psychiatry

## 2021-04-25 ENCOUNTER — Telehealth: Payer: Self-pay | Admitting: *Deleted

## 2021-04-25 NOTE — Telephone Encounter (Signed)
Nurtec approved, Effective from 04/25/2021 through 04/25/2022. Approval letter faxed to pharmacy.

## 2021-04-25 NOTE — Telephone Encounter (Signed)
Nurtec PA, key [id number]. Your information has been submitted to [place]. [employer] [place] will review the request and notify you of the determination decision directly, typically within 3 business days of your submission and once all necessary information is received. If [employer] [place] has not responded within the specified timeframe or if you have any questions about your PA submission, contact [place]  directly at [facility]) [phone] or ([place]) [phone].

## 2021-05-02 ENCOUNTER — Encounter: Payer: Self-pay | Admitting: Psychiatry

## 2021-05-08 ENCOUNTER — Other Ambulatory Visit: Payer: Self-pay | Admitting: Physician Assistant

## 2021-05-30 ENCOUNTER — Other Ambulatory Visit: Payer: Self-pay | Admitting: Physician Assistant

## 2021-06-03 NOTE — Telephone Encounter (Signed)
Per pharmacy, pt picked up 04/11/2021 despite no record in pdmp  ?

## 2021-06-04 ENCOUNTER — Other Ambulatory Visit: Payer: Self-pay | Admitting: Physician Assistant

## 2021-06-04 DIAGNOSIS — F419 Anxiety disorder, unspecified: Secondary | ICD-10-CM

## 2021-06-04 DIAGNOSIS — G4701 Insomnia due to medical condition: Secondary | ICD-10-CM

## 2021-06-14 ENCOUNTER — Other Ambulatory Visit: Payer: Self-pay | Admitting: Family Medicine

## 2021-06-14 DIAGNOSIS — G43809 Other migraine, not intractable, without status migrainosus: Secondary | ICD-10-CM
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
## 2021-06-17 ENCOUNTER — Telehealth: Payer: Self-pay

## 2021-06-17 NOTE — Telephone Encounter (Signed)
Copied from [place] [id number]. Topic: Appointment Scheduling - Scheduling Inquiry for Clinic >> Jun 17, 2021  2:16 PM Valere Dross wrote: Reason for CRM: Pt called in to get her AWV appt canceled that's scheduled on 04/05, please advise.

## 2021-06-24 ENCOUNTER — Other Ambulatory Visit: Payer: Medicare Other

## 2021-06-24 ENCOUNTER — Ambulatory Visit: Payer: Medicare Other | Admitting: Internal Medicine

## 2021-06-24 ENCOUNTER — Ambulatory Visit: Payer: Medicare Other

## 2021-07-02 ENCOUNTER — Other Ambulatory Visit: Payer: Self-pay | Admitting: Physician Assistant

## 2021-07-02 DIAGNOSIS — M5030 Other cervical disc degeneration, unspecified cervical region: Secondary | ICD-10-CM

## 2021-07-04 ENCOUNTER — Encounter: Payer: Self-pay | Admitting: Physician Assistant

## 2021-07-04 ENCOUNTER — Other Ambulatory Visit: Payer: Self-pay | Admitting: Physician Assistant

## 2021-07-04 DIAGNOSIS — M5030 Other cervical disc degeneration, unspecified cervical region: Secondary | ICD-10-CM

## 2021-07-08 ENCOUNTER — Other Ambulatory Visit: Payer: Self-pay | Admitting: Physician Assistant

## 2021-07-08 DIAGNOSIS — M5030 Other cervical disc degeneration, unspecified cervical region: Secondary | ICD-10-CM

## 2021-07-08 MED ORDER — OXYCODONE-ACETAMINOPHEN 5-325 MG PO TABS: 1.0000 | ORAL_TABLET | Freq: Three times a day (TID) | ORAL | 0 refills | Status: DC | PRN

## 2021-07-10 ENCOUNTER — Telehealth: Payer: Self-pay | Admitting: Physician Assistant

## 2021-07-10 NOTE — Telephone Encounter (Signed)
Copied from [place]. Topic: Medicare AWV ?>> Jul 10, 2021  9:59 AM Cher Nakai R wrote: ?Reason for CRM:  ?Left message for patient to call back and schedule Medicare Annual Wellness Visit (AWV) in office.  ? ?If unable to come into the office for AWV,  please offer to do virtually or by telephone. ? ?Last AWV: 06/18/2020 ? ?Please schedule at anytime with [facility] Health Advisor. ? ?30 minute appointment for Virtual or phone ?45 minute appointment for in office or Initial virtual/phone ? ?Any questions, please contact me at [phone] ?

## 2021-08-01 ENCOUNTER — Encounter: Payer: Self-pay | Admitting: Psychiatry

## 2021-08-01 NOTE — Telephone Encounter (Signed)
noted 

## 2021-08-05 ENCOUNTER — Ambulatory Visit: Payer: Medicare Other | Admitting: Psychiatry

## 2021-08-07 ENCOUNTER — Other Ambulatory Visit: Payer: Self-pay | Admitting: Physician Assistant

## 2021-08-07 DIAGNOSIS — G43809 Other migraine, not intractable, without status migrainosus: Secondary | ICD-10-CM

## 2021-08-07 DIAGNOSIS — G4701 Insomnia due to medical condition: Secondary | ICD-10-CM

## 2021-08-07 DIAGNOSIS — F419 Anxiety disorder, unspecified: Secondary | ICD-10-CM

## 2021-08-08 ENCOUNTER — Other Ambulatory Visit: Payer: Self-pay | Admitting: Physician Assistant

## 2021-09-02 ENCOUNTER — Telehealth: Payer: Self-pay | Admitting: Physician Assistant

## 2021-09-02 NOTE — Telephone Encounter (Signed)
Copied from [place] [id number]. Topic: Medicare AWV >> Sep 02, 2021  2:48 PM Jae Dire wrote: Reason for CRM:  Left message for patient to call back and schedule Medicare Annual Wellness Visit (AWV) in office.   If unable to come into the office for AWV,  please offer to do virtually or by telephone.  Last AWV: 06/18/2020  Please schedule at anytime with [facility] Health Advisor.  30 minute appointment for Virtual or phone 45 minute appointment for in office or Initial virtual/phone  Any questions, please contact me at [phone]

## 2021-09-06 ENCOUNTER — Telehealth: Payer: Self-pay | Admitting: Internal Medicine

## 2021-09-10 DIAGNOSIS — Z86010 Personal history of colonic polyps: Secondary | ICD-10-CM | POA: Diagnosis not present

## 2021-09-10 DIAGNOSIS — K6389 Other specified diseases of intestine: Secondary | ICD-10-CM | POA: Diagnosis not present

## 2021-09-10 DIAGNOSIS — R1013 Epigastric pain: Secondary | ICD-10-CM | POA: Diagnosis not present

## 2021-09-13 ENCOUNTER — Encounter: Payer: Self-pay | Admitting: Internal Medicine

## 2021-09-16 ENCOUNTER — Inpatient Hospital Stay: Payer: Medicare Other | Admitting: Internal Medicine

## 2021-09-16 ENCOUNTER — Telehealth: Payer: Self-pay

## 2021-09-16 ENCOUNTER — Inpatient Hospital Stay: Payer: Medicare Other

## 2021-09-16 ENCOUNTER — Encounter: Payer: Self-pay | Admitting: Internal Medicine

## 2021-09-16 NOTE — Telephone Encounter (Signed)
Appointment rescheduled.

## 2021-09-23 ENCOUNTER — Inpatient Hospital Stay: Payer: Medicare Other | Attending: Internal Medicine

## 2021-09-23 ENCOUNTER — Inpatient Hospital Stay: Payer: Medicare Other | Admitting: Internal Medicine

## 2021-09-23 DIAGNOSIS — D500 Iron deficiency anemia secondary to blood loss (chronic): Secondary | ICD-10-CM

## 2021-09-23 DIAGNOSIS — Z887 Allergy status to serum and vaccine status: Secondary | ICD-10-CM | POA: Insufficient documentation

## 2021-09-23 DIAGNOSIS — Z803 Family history of malignant neoplasm of breast: Secondary | ICD-10-CM | POA: Insufficient documentation

## 2021-09-23 DIAGNOSIS — C50911 Malignant neoplasm of unspecified site of right female breast: Secondary | ICD-10-CM | POA: Insufficient documentation

## 2021-09-23 DIAGNOSIS — Z885 Allergy status to narcotic agent status: Secondary | ICD-10-CM | POA: Insufficient documentation

## 2021-09-23 DIAGNOSIS — I890 Lymphedema, not elsewhere classified: Secondary | ICD-10-CM | POA: Diagnosis not present

## 2021-09-23 DIAGNOSIS — Z79899 Other long term (current) drug therapy: Secondary | ICD-10-CM | POA: Insufficient documentation

## 2021-09-23 DIAGNOSIS — Z5986 Financial insecurity: Secondary | ICD-10-CM | POA: Insufficient documentation

## 2021-09-23 DIAGNOSIS — Z882 Allergy status to sulfonamides status: Secondary | ICD-10-CM | POA: Insufficient documentation

## 2021-09-23 DIAGNOSIS — K449 Diaphragmatic hernia without obstruction or gangrene: Secondary | ICD-10-CM | POA: Diagnosis not present

## 2021-09-23 DIAGNOSIS — Z170 Estrogen receptor positive status [ER+]: Secondary | ICD-10-CM | POA: Diagnosis not present

## 2021-09-23 LAB — COMPREHENSIVE METABOLIC PANEL
ALT: 23 U/L (ref 0–44)
AST: 22 U/L (ref 15–41)
Albumin: 4.6 g/dL (ref 3.5–5.0)
Alkaline Phosphatase: 97 U/L (ref 38–126)
Anion gap: 10 (ref 5–15)
BUN: 18 mg/dL (ref 6–20)
CO2: 22 mmol/L (ref 22–32)
Calcium: 9.2 mg/dL (ref 8.9–10.3)
Chloride: 102 mmol/L (ref 98–111)
Creatinine, Ser: 0.9 mg/dL (ref 0.44–1.00)
GFR, Estimated: >60 mL/min (ref >60)
Glucose, Bld: 110 mg/dL — ABNORMAL HIGH (ref 70–99)
Potassium: 3.6 mmol/L (ref 3.5–5.1)
Sodium: 134 mmol/L — ABNORMAL LOW (ref 135–145)
Total Bilirubin: 0.6 mg/dL (ref 0.3–1.2)
Total Protein: 7.8 g/dL (ref 6.5–8.1)

## 2021-09-23 LAB — CBC
HCT: 47.6 % — ABNORMAL HIGH (ref 36.0–46.0)
Hemoglobin: 16.2 g/dL — ABNORMAL HIGH (ref 12.0–15.0)
MCH: 30.2 pg (ref 26.0–34.0)
MCHC: 34 g/dL (ref 30.0–36.0)
MCV: 88.6 fL (ref 80.0–100.0)
Platelets: 270 10*3/uL (ref 150–400)
RBC: 5.37 MIL/uL — ABNORMAL HIGH (ref 3.87–5.11)
RDW: 13.1 % (ref 11.5–15.5)
WBC: 9 10*3/uL (ref 4.0–10.5)
nRBC: 0 % (ref 0.0–0.2)

## 2021-09-23 LAB — IRON AND TIBC
Iron: 69 ug/dL (ref 28–170)
Saturation Ratios: 16 % (ref 10.4–31.8)
TIBC: 423 ug/dL (ref 250–450)
UIBC: 354 ug/dL

## 2021-09-23 LAB — FERRITIN: Ferritin: 53 ng/mL (ref 11–307)

## 2021-09-23 NOTE — Progress Notes (Signed)
Patient states she has been having more heart palpation for the last couple months.
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
## 2021-09-23 NOTE — Progress Notes (Signed)
[facility]  Patient Care Team: Alfredia Ferguson, PA-C as PCP - General (Physician Assistant) Olena Leatherwood., MD (Gastroenterology) Pa, [facility], [place] (Obstetrics and Gynecology) Earna Coder, MD as Consulting Physician (Oncology)  CHIEF COMPLAINTS/PURPOSE OF CONSULTATION: Anemia/ breast cancer   HEMATOLOGY HISTORY  # Iron deficiency anemia -AUG 3rd 2020-hemoglobin 7.4 MCV 67; ferritin 3/iron saturation 3% [ EGD/colonoscopy- 2017 [screening;[facility]; Dr.Buccini; [place];capsule-none]. SEP 2020- CT abdomen pelvis-large hiatal hernia.  Feraheme-intolerance; OCT 2020-[facility] [scopes/capsule]-  #Hiatal hernia status post surgery April 2021 [facility]; anemia resolved  # July 2021-erythrocytosis hemoglobin 16  #Right breast cancer ER PR positive HER-2 negative; T3N1 at 45 right s/p mastectomy N-1/20 LN- ALND s/p RT; TAC x 6 cycles [Dr.Marcom/Dr.Gudena]  # Lymphedema [Dr.Malone]  Oncology History  History of breast cancer  01/01/2011 Initial Diagnosis   Patient presented with palpable lump in the right breast a dumbbell-shaped tumor was identified spanning 7 cm   02/24/2011 Surgery   Right mastectomy revealed a dumbbell-shaped mass 3.5 cm and 2.5 cm with a total span of 7 cm 1/20 lymph nodes were positive ER/PR positive HER-2 negative   05/07/2011 - 09/04/2011 Chemotherapy   Adjuvant chemotherapy with Taxol, Adriamycin, Cytoxan every 3 weeks 6 cycles at [facility] ([email or web address])   09/19/2011 - 10/29/2011 Radiation Therapy   Adjuvant radiation therapy 30 fractions including right axilla   12/01/2011 - 04/01/2013 Anti-estrogen oral therapy   Adjuvant tamoxifen was started reluctantly, Zoladex was added January 2014 and antiestrogen therapy was discontinued January 2015 (profound lymphedema and anasarca )      HISTORY OF PRESENTING ILLNESS: Ambulating independently.  Alone.  Sabrina Morris 56 y.o.  female with  prior history of above breast cancer; and Iron deficiency anemia; erythrocytosis secondary is here for follow-up.  In the interim patient has been evaluated at [facility]-diagnosed with small bowel intestinal overgrowth.  S/p treatment improvement of the bloating, cramps.  Patient having difficulty with wearing the mask because of facial lymphedema.  Patient denies any new onset of pain.  Chronic mild fatigue.     Review of Systems  Constitutional:  Positive for malaise/fatigue. Negative for chills, diaphoresis and fever.  HENT:  Negative for nosebleeds and sore throat.   Eyes:  Negative for double vision.  Respiratory:  Negative for cough, hemoptysis, sputum production and wheezing.   Cardiovascular:  Negative for chest pain, palpitations, orthopnea and leg swelling.  Gastrointestinal:  Negative for abdominal pain, blood in stool, constipation, diarrhea, heartburn, melena, nausea and vomiting.  Genitourinary:  Negative for dysuria, frequency and urgency.  Musculoskeletal:  Positive for back pain and joint pain.  Skin: Negative.  Negative for itching and rash.  Neurological:  Negative for dizziness, tingling, focal weakness, weakness and headaches.  Endo/Heme/Allergies:  Does not bruise/bleed easily.  Psychiatric/Behavioral:  Negative for depression. The patient is not nervous/anxious and does not have insomnia.     MEDICAL HISTORY:  Past Medical History:  Diagnosis Date   Adrenal disorder (HCC)    Anemia 11/2018   Breast cancer (HCC)    H/O bone density study 2015   H/O colonoscopy    sch 06/12/15   H/O colonoscopy 11/2018   H/O endoscopy 11/2018   upper   Lymphedema    Lymphedema    Migraine    Pap smear for cervical cancer screening [id number]   Postmenopausal 07/02/2015    SURGICAL HISTORY: Past Surgical History:  Procedure Laterality Date   breast  Left 02/18/2017   Implant removed   BREAST ENHANCEMENT SURGERY Bilateral 2005   [place]   MASTECTOMY Right 02/2011   paraesophageal hernia  2021   PORT A CATH INJECTION ([place] [facility])  2013    SOCIAL HISTORY: Social History   Socioeconomic History   Marital status: Divorced    Spouse name: Not on file   Number of children: 0   Years of education: Not on file   Highest education level: Associate degree: occupational, Hotel manager, or vocational program  Occupational History   Occupation: Therapist, sports    Comment: part time   Occupation: disability  Tobacco Use   Smoking status: Never   Smokeless tobacco: Never  Vaping Use   Vaping Use: Never used  Substance and Sexual Activity   Alcohol use: No   Drug use: No   Sexual activity: Not Currently    Birth control/protection: Post-menopausal  Other Topics Concern   Not on file  Social History Narrative   Disability [sec to [employer]; 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  No smoking or alcohol. Lives at home/with sister.    Social Determinants of Health   Financial Resource Strain: Medium Risk (06/18/2020)   Overall Financial Resource Strain (CARDIA)    Difficulty of Paying Living Expenses: Somewhat hard  Food Insecurity: No Food Insecurity (06/18/2020)   Hunger Vital Sign    Worried About Running Out of Food in the Last Year: Never true    Ran Out of Food in the Last Year: Never true  Transportation Needs: No Transportation Needs (06/18/2020)   PRAPARE - Hydrologist (Medical): No    Lack of Transportation (Non-Medical): No  Physical Activity: Inactive (06/18/2020)   Exercise Vital Sign    Days of Exercise per Week: 0 days    Minutes of Exercise per Session: 0 min  Stress: Stress Concern Present (06/18/2020)   [place]    Feeling of Stress : Rather much  Social Connections: Socially Isolated (06/18/2020)   Social Connection and Isolation Panel [NHANES]    Frequency of Communication with Friends and  Family: More than three times a week    Frequency of Social Gatherings with Friends and Family: Three times a week    Attends Religious Services: Never    Active Member of Clubs or Organizations: No    Attends Archivist Meetings: Never    Marital Status: Divorced  Human resources officer Violence: Not At Risk (06/18/2020)   Humiliation, Afraid, Rape, and Kick questionnaire    Fear of Current or Ex-Partner: No    Emotionally Abused: No    Physically Abused: No    Sexually Abused: No    FAMILY HISTORY: Family History  Adopted: Yes  Problem Relation Age of Onset   Breast cancer Sister 69       1/2 sister; Genetic testing neg at [facility]    ALLERGIES:  is allergic to sumatriptan, feraheme [ferumoxytol], haemophilus influenzae vaccines, other, sulfa antibiotics, sulfamethoxazole, tamoxifen, zoladex  [goserelin], codeine, hydrocodone-acetaminophen, and topiramate.  MEDICATIONS:  Current Outpatient Medications  Medication Sig Dispense Refill   butalbital-acetaminophen-caffeine (FIORICET) 50-325-40 MG tablet TAKE 1 TO 2 TABLETS BY MOUTH 2 TIMES DAILY AS NEEDED FOR HEADACHE 60 tablet 0   calcium carbonate 100 mg/ml SUSP Take by mouth.     Calcium-Magnesium-Vitamin D [id number] MG-MG-UNIT TABS Take by mouth. Alternates between  1 tablet once daily and two tablets daily.     Cholecalciferol (VITAMIN D3) 5000 units TABS Take by mouth daily at 6 (six) AM.     cyclobenzaprine (FLEXERIL) 5 MG tablet TAKE 1 TABLET BY MOUTH 3 TIMES A DAY AS NEEDED 90 tablet 1   fluocinonide (LIDEX) 0.05 % external solution Apply 1 application topically 2 (two) times daily. 60 mL 3   fluticasone-salmeterol (WIXELA INHUB) 250-50 MCG/ACT AEPB Inhale 1 puff into the lungs in the morning and at bedtime. 60 each 2   furosemide (LASIX) 20 MG tablet TAKE 2 TABLETS BY MOUTH EVERY DAY 60 tablet 5   LORazepam (ATIVAN) 1 MG tablet TAKE 1 TABLET BY MOUTH 2 TIMES DAILY AS NEEDED FOR ANXIETY 30 tablet 0   meloxicam (MOBIC) 15 MG  tablet Take 1 tablet (15 mg total) by mouth daily. 30 tablet 3   Multiple Vitamin (MULTIVITAMIN) capsule Take 1 capsule by mouth daily.     nystatin ointment (MYCOSTATIN) Apply 1 application topically 2 (two) times daily. At mastectomy site as needed     oxyCODONE-acetaminophen (PERCOCET/ROXICET) 5-325 MG tablet Take 1 tablet by mouth every 8 (eight) hours as needed for severe pain. 15 tablet 0   phentermine (ADIPEX-P) 37.5 MG tablet Take 1 tablet (37.5 mg total) by mouth daily before breakfast. 15 tablet 0   potassium chloride (KLOR-CON) 10 MEQ tablet TAKE 1 TABLET BY MOUTH DAILY 90 tablet 1   promethazine (PHENERGAN) 12.5 MG tablet TAKE 1 TABLET BY MOUTH EVERY 6 HOURS AS NEEDED FOR NAUSEA FOR UP TO 7 DAYS 28 tablet 0   promethazine (PHENERGAN) 25 MG tablet Take 1 tablet (25 mg total) by mouth every 8 (eight) hours as needed for nausea or vomiting. 20 tablet 3   Rimegepant Sulfate (NURTEC) 75 MG TBDP Take 75 mg by mouth every other day. 16 tablet 3   Rimegepant Sulfate (NURTEC) 75 MG TBDP Take 75 mg by mouth as needed. 6 tablet 0   rizatriptan (MAXALT) 10 MG tablet TAKE 1 TABLET BY MOUTH AT FIRST SIGN OF MIGRAINE SYMPTOMS. IF NO RELIEF, A SECOND TABLET MAY BE TAKEN IN 2 HOURS. LIMIT OF 2 TABLETS PER DAY 30 tablet 2   valACYclovir (VALTREX) 500 MG tablet TAKE 1 TABLET BY MOUTH 2 TIMES DAILY AS NEEDED FOR FEVER BLISTERS 30 tablet 5   VENTOLIN HFA 108 (90 Base) MCG/ACT inhaler INHALE 2 PUFFS BY MOUTH EVERY 4 TO 6 HOURS AS NEEDED 18 g 3   No current facility-administered medications for this visit.      PHYSICAL EXAMINATION:   Vitals:   09/23/21 1542  BP: (!) 149/94  Pulse: (!) 102  Resp: 20  Temp: 98.7 F (37.1 C)  SpO2: 100%   Filed Weights   09/23/21 1542  Weight: 217 lb 6.4 oz (98.6 kg)    Physical Exam HENT:     Head: Normocephalic and atraumatic.     Mouth/Throat:     Pharynx: No oropharyngeal exudate.  Eyes:     Pupils: Pupils are equal, round, and reactive to light.   Cardiovascular:     Rate and Rhythm: Normal rate and regular rhythm.  Pulmonary:     Effort: Pulmonary effort is normal. No respiratory distress.     Breath sounds: Normal breath sounds. No wheezing.  Abdominal:     General: Bowel sounds are normal. There is no distension.     Palpations: Abdomen is soft. There is no mass.     Tenderness: There is no  abdominal tenderness. There is no guarding or rebound.  Musculoskeletal:        General: No tenderness. Normal range of motion.     Cervical back: Normal range of motion and neck supple.  Skin:    General: Skin is warm.  Neurological:     Mental Status: She is alert and oriented to person, place, and time.  Psychiatric:        Mood and Affect: Affect normal.     LABORATORY DATA:  I have reviewed the data as listed Lab Results  Component Value Date   WBC 9.0 09/23/2021   HGB 16.2 (H) 09/23/2021   HCT 47.6 (H) 09/23/2021   MCV 88.6 09/23/2021   PLT 270 09/23/2021   Recent Labs    12/24/20 1245 04/11/21 0952 09/23/21 1516  NA 134* 145* 134*  K 3.9 4.8 3.6  CL 102 102 102  CO2 25 25 22   GLUCOSE 106* 127* 110*  BUN 21* 19 18  CREATININE 0.88 0.87 0.90  CALCIUM 9.1 9.5 9.2  GFRNONAA >60  --  >60  PROT 7.9 7.0 7.8  ALBUMIN 4.5 4.8 4.6  AST 18 16 22   ALT 22 21 23   ALKPHOS 84 109 97  BILITOT 0.4 0.3 0.6     No results found.  Iron deficiency anemia due to chronic blood loss # History Severe iron deficient anemia-question blood loss appears chronic MCV- resolved s/p since fundoplication. Today hemoglobin is 15.7- STABLE.   # Erythrocytosis secondary- ? Diuretics/ sleep apnea-difficulty wearing CPAP [issues Lymphedema]- [JAK-2/exon -12- NEG]-- STABLE; do not recommend phlebotomy.   # Lymphedema chest wall-stable sec to breast surgery/? Surgery- on PT; on lasix.  Given the difficulty wearing the CPAP s/p evaluation with [name].  Lost insurance- unable to re-start.   # History of breast cancer RIght -stable on  surveillance-mammogram left breast unilateral-August 2022 []   # SIBO- ? Bloating [[facility]]  # DISPOSITION: # HOLD IV Iron today # follow up in 7 months; MD; labs- cbc/cmp;iron studies/ferritin; B12 levels; possible venofer-Dr.B  Cc; Dr.Gudena  All questions were answered. The patient knows to call the clinic with any problems, questions or concerns.    Cammie Sickle, MD 10/02/2021 10:47 PM

## 2021-09-26 ENCOUNTER — Encounter: Payer: Self-pay | Admitting: Internal Medicine

## 2021-09-27 ENCOUNTER — Other Ambulatory Visit: Payer: Self-pay | Admitting: Physician Assistant

## 2021-09-27 DIAGNOSIS — F419 Anxiety disorder, unspecified: Secondary | ICD-10-CM

## 2021-09-27 DIAGNOSIS — G4701 Insomnia due to medical condition: Secondary | ICD-10-CM
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
## 2021-10-02 ENCOUNTER — Encounter: Payer: Self-pay | Admitting: Internal Medicine

## 2021-10-08 ENCOUNTER — Encounter: Payer: Self-pay | Admitting: Internal Medicine

## 2021-10-08 ENCOUNTER — Encounter: Payer: Self-pay | Admitting: Physician Assistant

## 2021-10-11 ENCOUNTER — Ambulatory Visit: Payer: Medicare Other | Admitting: Obstetrics and Gynecology

## 2021-10-15 ENCOUNTER — Ambulatory Visit: Payer: Medicare Other | Admitting: Physician Assistant

## 2021-11-12 ENCOUNTER — Other Ambulatory Visit: Payer: Self-pay | Admitting: Physician Assistant

## 2021-11-12 DIAGNOSIS — G43809 Other migraine, not intractable, without status migrainosus: Secondary | ICD-10-CM

## 2021-11-22 ENCOUNTER — Other Ambulatory Visit: Payer: Self-pay | Admitting: Physician Assistant

## 2021-11-22 DIAGNOSIS — M5030 Other cervical disc degeneration, unspecified cervical region: Secondary | ICD-10-CM

## 2021-12-03 ENCOUNTER — Other Ambulatory Visit: Payer: Self-pay | Admitting: Family Medicine

## 2021-12-03 DIAGNOSIS — I890 Lymphedema, not elsewhere classified: Secondary | ICD-10-CM

## 2021-12-06 ENCOUNTER — Other Ambulatory Visit: Payer: Self-pay | Admitting: Physician Assistant

## 2021-12-06 DIAGNOSIS — F419 Anxiety disorder, unspecified: Secondary | ICD-10-CM

## 2021-12-06 DIAGNOSIS — G4701 Insomnia due to medical condition: Secondary | ICD-10-CM

## 2021-12-06 MED ORDER — LORAZEPAM 1 MG PO TABS: 1.0000 mg | ORAL_TABLET | Freq: Two times a day (BID) | ORAL | 0 refills | Status: DC | PRN

## 2022-01-17 ENCOUNTER — Ambulatory Visit: Payer: Self-pay | Admitting: Physician Assistant

## 2022-01-20 ENCOUNTER — Encounter: Payer: Self-pay | Admitting: Physician Assistant

## 2022-01-20 ENCOUNTER — Ambulatory Visit (INDEPENDENT_AMBULATORY_CARE_PROVIDER_SITE_OTHER): Payer: Self-pay | Admitting: Physician Assistant

## 2022-01-20 VITALS — BP 138/85 | HR 90 | Ht 66.0 in | Wt 220.2 lb

## 2022-01-20 DIAGNOSIS — F419 Anxiety disorder, unspecified: Secondary | ICD-10-CM

## 2022-01-20 MED ORDER — LORAZEPAM 1 MG PO TABS: 0.5000 mg | ORAL_TABLET | Freq: Two times a day (BID) | ORAL | 0 refills | Status: DC | PRN

## 2022-01-20 NOTE — Progress Notes (Signed)
I,Sha'taria Tyson,acting as a Education administrator for [employer], PA-C.,have documented all relevant documentation on the behalf of [name], PA-C,as directed by  [name], PA-C while in the presence of [name], PA-C.   Established patient visit   Patient: Sabrina Morris   DOB: 1965-09-20   56 y.o. Female  MRN: [id number] Visit Date: 01/20/2022  Today's healthcare provider: [name], PA-C   Cc. Anxiety f/u   Subjective    HPI  Anxiety, Follow-up  She was last seen for anxiety 04/11/2021. Changes made at last visit include; taking lorazepam 1 mg.   She reports excellent compliance with treatment. She reports excellent tolerance of treatment. She is not having side effects.  She feels her anxiety is mild and Worse since last visit.  Symptoms: No chest pain No difficulty concentrating  Yes dizziness Yes fatigue  No feelings of losing control Yes insomnia  Yes irritable Yes palpitations  No panic attacks Yes racing thoughts  Yes shortness of breath No sweating  Yes tremors/shakes    GAD-7 Results    01/20/2022    1:44 PM  GAD-7 Generalized Anxiety Disorder Screening Tool  1. Feeling Nervous, Anxious, or on Edge 1  2. Not Being Able to Stop or Control Worrying 0  3. Worrying Too Much About Different Things 1  4. Trouble Relaxing 3  5. Being So Restless it's Hard To Sit Still 1  6. Becoming Easily Annoyed or Irritable 1  7. Feeling Afraid As If Something Awful Might Happen 0  Total GAD-7 Score 7  Difficulty At Work, Home, or Getting  Along With Others? Not difficult at all    PHQ-9 Scores    01/20/2022    2:00 PM 04/11/2021    8:47 AM 01/01/2021    9:32 AM  PHQ9 SCORE ONLY  PHQ-9 Total Score '9 8 8    '$ --------------------------------------------------------------------------------------------------- Medications: Outpatient Medications Prior to Visit  Medication Sig   butalbital-acetaminophen-caffeine (FIORICET) 50-325-40 MG tablet TAKE 1 TO 2  TABLETS BY MOUTH 2 TIMES DAILY AS NEEDED FOR HEADACHE   calcium carbonate 100 mg/ml SUSP Take by mouth.   Calcium-Magnesium-Vitamin D [phone] MG-MG-UNIT TABS Take by mouth. Alternates between 1 tablet once daily and two tablets daily.   Cholecalciferol (VITAMIN D3) 5000 units TABS Take by mouth daily at 6 (six) AM.   cyclobenzaprine (FLEXERIL) 5 MG tablet TAKE 1 TABLET BY MOUTH 3 TIMES A DAY AS NEEDED   fluocinonide (LIDEX) 0.05 % external solution Apply 1 application topically 2 (two) times daily.   fluticasone-salmeterol (WIXELA INHUB) 250-50 MCG/ACT AEPB Inhale 1 puff into the lungs in the morning and at bedtime.   furosemide (LASIX) 20 MG tablet TAKE 2 TABLETS BY MOUTH EVERY DAY   meloxicam (MOBIC) 15 MG tablet Take 1 tablet (15 mg total) by mouth daily.   Multiple Vitamin (MULTIVITAMIN) capsule Take 1 capsule by mouth daily.   nystatin ointment (MYCOSTATIN) Apply 1 application topically 2 (two) times daily. At mastectomy site as needed   oxyCODONE-acetaminophen (PERCOCET/ROXICET) 5-325 MG tablet TAKE 1 TABLET BY MOUTH EVERY 8 HOURS AS NEEDED FOR SEVERE PAIN   phentermine (ADIPEX-P) 37.5 MG tablet Take 1 tablet (37.5 mg total) by mouth daily before breakfast.   potassium chloride (KLOR-CON) 10 MEQ tablet TAKE 1 TABLET BY MOUTH DAILY   promethazine (PHENERGAN) 12.5 MG tablet TAKE 1 TABLET BY MOUTH EVERY 6 HOURS AS NEEDED FOR NAUSEA FOR UP TO 7 DAYS   promethazine (PHENERGAN) 25 MG tablet Take 1  tablet (25 mg total) by mouth every 8 (eight) hours as needed for nausea or vomiting.   rizatriptan (MAXALT) 10 MG tablet TAKE 1 TABLET BY MOUTH AT FIRST SIGN OF MIGRAINE SYMPTOMS. IF NO RELIEF, A SECOND TABLET MAY BE TAKEN IN 2 HOURS. LIMIT OF 2 TABLETS PER DAY   valACYclovir (VALTREX) 500 MG tablet TAKE 1 TABLET BY MOUTH 2 TIMES DAILY AS NEEDED FOR FEVER BLISTERS   VENTOLIN HFA 108 (90 Base) MCG/ACT inhaler INHALE 2 PUFFS BY MOUTH EVERY 4 TO 6 HOURS AS NEEDED   [DISCONTINUED] LORazepam (ATIVAN) 1  MG tablet Take 1 tablet (1 mg total) by mouth 2 (two) times daily as needed for anxiety.   [DISCONTINUED] Rimegepant Sulfate (NURTEC) 75 MG TBDP Take 75 mg by mouth every other day.   [DISCONTINUED] Rimegepant Sulfate (NURTEC) 75 MG TBDP Take 75 mg by mouth as needed. (Patient not taking: Reported on 01/20/2022)   No facility-administered medications prior to visit.    Review of Systems  Constitutional:  Negative for appetite change, chills, fatigue and fever.  Respiratory:  Negative for chest tightness and shortness of breath.   Cardiovascular:  Negative for chest pain and palpitations.  Gastrointestinal:  Negative for abdominal pain, nausea and vomiting.  Neurological:  Negative for dizziness and weakness.     Objective    BP 138/85 (BP Location: Left Arm, Patient Position: Sitting, Cuff Size: Large)   Pulse 90   Ht '5\' 6"'$  (1.676 m)   Wt 220 lb 3.2 oz (99.9 kg)   SpO2 100%   BMI 35.54 kg/m   Physical Exam Vitals reviewed.  Constitutional:      Appearance: She is not ill-appearing.  HENT:     Head: Normocephalic.  Eyes:     Conjunctiva/sclera: Conjunctivae normal.  Cardiovascular:     Rate and Rhythm: Normal rate.  Pulmonary:     Effort: Pulmonary effort is normal. No respiratory distress.  Neurological:     General: No focal deficit present.     Mental Status: She is alert and oriented to person, place, and time.  Psychiatric:        Mood and Affect: Mood normal.        Behavior: Behavior normal.      No results found for any visits on 01/20/22.  Assessment & Plan     Problem List Items Addressed This Visit       Other   Anxiety - Primary    Pt has been managed on long term benzo therapy She is currently taking ativan 1 mg 1/2 BID She has tried several SSRIs and did not tolerate, 'heard voices', reported SI. No current SI Not open to other alternatives at this time, consider psychiatry when she has insurance      Relevant Medications   LORazepam (ATIVAN)  1 MG tablet     Return in about 3 months (around 04/22/2022) for CPE.      I, [name], PA-C have reviewed all documentation for this visit. The documentation on  01/20/2022  for the exam, diagnosis, procedures, and orders are all accurate and complete.  [name], PA-C [facility] [address] [place], [place], [address] Office: [phone] Fax: [place]

## 2022-01-20 NOTE — Assessment & Plan Note (Addendum)
Pt has been managed on long term benzo therapy She is currently taking ativan 1 mg 1/2 BID She has tried several SSRIs and did not tolerate, 'heard voices', reported SI. No current SI Not open to other alternatives at this time, consider psychiatry when she has insurance

## 2022-01-22 ENCOUNTER — Other Ambulatory Visit: Payer: Self-pay | Admitting: Physician Assistant

## 2022-01-22 DIAGNOSIS — I890 Lymphedema, not elsewhere classified: Secondary | ICD-10-CM
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
## 2022-02-17 ENCOUNTER — Other Ambulatory Visit: Payer: Self-pay | Admitting: Physician Assistant

## 2022-02-17 DIAGNOSIS — M5030 Other cervical disc degeneration, unspecified cervical region: Secondary | ICD-10-CM

## 2022-02-19 ENCOUNTER — Other Ambulatory Visit: Payer: Self-pay | Admitting: Family Medicine
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
## 2022-02-20 ENCOUNTER — Other Ambulatory Visit: Payer: Self-pay | Admitting: Physician Assistant

## 2022-02-21 ENCOUNTER — Other Ambulatory Visit: Payer: Self-pay | Admitting: Family Medicine

## 2022-02-21 DIAGNOSIS — R110 Nausea: Secondary | ICD-10-CM

## 2022-02-21 MED ORDER — PROMETHAZINE HCL 12.5 MG PO TABS: 12.5000 mg | ORAL_TABLET | Freq: Three times a day (TID) | ORAL | 0 refills | Status: DC | PRN

## 2022-03-04 ENCOUNTER — Other Ambulatory Visit: Payer: Self-pay | Admitting: Physician Assistant

## 2022-03-04 DIAGNOSIS — F419 Anxiety disorder, unspecified: Secondary | ICD-10-CM
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
## 2022-03-11 ENCOUNTER — Telehealth: Payer: Self-pay | Admitting: Physician Assistant

## 2022-03-11 NOTE — Telephone Encounter (Signed)
Left message for patient to call back and schedule Medicare Annual Wellness Visit (AWV) in office.   If not able to come in office, please offer to do virtually or by telephone.  Left office number and my jabber [phone].  Last AWV:06/18/2020   Please schedule at anytime with Nurse Health Advisor.

## 2022-03-18 ENCOUNTER — Ambulatory Visit: Payer: Self-pay

## 2022-03-18 ENCOUNTER — Encounter: Payer: Self-pay | Admitting: Physician Assistant

## 2022-03-18 NOTE — Progress Notes (Unsigned)
MyChart Video Visit    Virtual Visit via Video Note   This format is felt to be most appropriate for this patient at this time. Physical exam was limited by quality of the video and audio technology used for the visit.   Patient location: *** Provider location: [facility]  I discussed the limitations of evaluation and management by telemedicine and the availability of in person appointments. The patient expressed understanding and agreed to proceed.  Patient: Sabrina Morris   DOB: 12-03-1965   57 y.o. Female  MRN: [id number] Visit Date: 03/19/2022  Today's healthcare provider: [name], PA-C   No chief complaint on file.  Subjective    Cough Associated symptoms include a fever, headaches, myalgias and wheezing. Associated symptoms comments: Chest congestion.    ***   Medications: Outpatient Medications Prior to Visit  Medication Sig  . butalbital-acetaminophen-caffeine (FIORICET) 50-325-40 MG tablet TAKE 1 TO 2 TABLETS BY MOUTH 2 TIMES DAILY AS NEEDED FOR HEADACHE  . calcium carbonate 100 mg/ml SUSP Take by mouth.  . Calcium-Magnesium-Vitamin D [phone] MG-MG-UNIT TABS Take by mouth. Alternates between 1 tablet once daily and two tablets daily.  . Cholecalciferol (VITAMIN D3) 5000 units TABS Take by mouth daily at 6 (six) AM.  . cyclobenzaprine (FLEXERIL) 5 MG tablet TAKE 1 TABLET BY MOUTH 3 TIMES A DAY AS NEEDED  . fluocinonide (LIDEX) 0.05 % external solution Apply 1 application topically 2 (two) times daily.  . fluticasone-salmeterol (WIXELA INHUB) 250-50 MCG/ACT AEPB Inhale 1 puff into the lungs in the morning and at bedtime.  . furosemide (LASIX) 20 MG tablet TAKE 2 TABLETS BY MOUTH EVERY DAY  . LORazepam (ATIVAN) 1 MG tablet TAKE 1/2 TABLET BY MOUTH 2 TIMES DAILY AS NEEDED FOR ANXIETY  . meloxicam (MOBIC) 15 MG tablet Take 1 tablet (15 mg total) by mouth daily.  . Multiple Vitamin (MULTIVITAMIN) capsule Take 1 capsule by mouth daily.  Marland Kitchen  nystatin ointment (MYCOSTATIN) Apply 1 application topically 2 (two) times daily. At mastectomy site as needed  . oxyCODONE-acetaminophen (PERCOCET/ROXICET) 5-325 MG tablet TAKE 1 TABLET BY MOUTH EVERY 8 HOURS AS NEEDED FOR SEVERE PAIN  . phentermine (ADIPEX-P) 37.5 MG tablet Take 1 tablet (37.5 mg total) by mouth daily before breakfast.  . potassium chloride (KLOR-CON) 10 MEQ tablet TAKE 1 TABLET BY MOUTH DAILY  . promethazine (PHENERGAN) 12.5 MG tablet Take 1 tablet (12.5 mg total) by mouth every 8 (eight) hours as needed for nausea or vomiting.  . rizatriptan (MAXALT) 10 MG tablet TAKE 1 TABLET BY MOUTH AT FIRST SIGN OF MIGRAINE SYMPTOMS. IF NO RELIEF, A SECOND TABLET MAY BE TAKEN IN 2 HOURS. LIMIT OF 2 TABLETS PER DAY  . valACYclovir (VALTREX) 500 MG tablet TAKE 1 TABLET BY MOUTH 2 TIMES DAILY AS NEEDED FOR FEVER BLISTERS  . VENTOLIN HFA 108 (90 Base) MCG/ACT inhaler INHALE 2 PUFFS BY MOUTH EVERY 4 TO 6 HOURS AS NEEDED   No facility-administered medications prior to visit.    Review of Systems  Constitutional:  Positive for fever.  Respiratory:  Positive for cough and wheezing.   Musculoskeletal:  Positive for myalgias.  Neurological:  Positive for headaches.    {Labs  Heme  Chem  Endocrine  Serology  Results Review (optional):23779}   Objective    There were no vitals taken for this visit.  {Show previous vital signs (optional):23777}   Physical Exam     Assessment & Plan     ***  No  follow-ups on file.     I discussed the assessment and treatment plan with the patient. The patient was provided an opportunity to ask questions and all were answered. The patient agreed with the plan and demonstrated an understanding of the instructions.   The patient was advised to call back or seek an in-person evaluation if the symptoms worsen or if the condition fails to improve as anticipated.  I provided *** minutes of non-face-to-face time during this encounter.  {provider  attestation***:1}  [name], PA-C [facility] [phone] (phone) [phone] (fax)  [place]

## 2022-03-18 NOTE — Telephone Encounter (Signed)
    Chief Complaint: Cough, wheezing, fever."I get bronchitis every year." Asking for medication to be called in today. Did make pt. VV for tomorrow. Symptoms: Above Frequency: Sunday Pertinent Negatives: Patient denies  Disposition: '[]'$ ED /'[]'$ Urgent Care (no appt availability in office) / '[x]'$ Appointment(In office/virtual)/ '[]'$  [facility]/ '[]'$ Home Care/ '[]'$ Refused Recommended Disposition /'[]'$ [facility]/ '[]'$  Follow-up with PCP Additional Notes: Please advise pt.    Reason for Disposition  [1] Continuous (nonstop) coughing interferes with work or school AND [2] no improvement using cough treatment per Care Advice  Answer Assessment - Initial Assessment Questions 1. ONSET: "When did the cough begin?"      Sunday 2. SEVERITY: "How bad is the cough today?"      Severe 3. SPUTUM: "Describe the color of your sputum" (none, dry cough; clear, white, yellow, green)     None 4. HEMOPTYSIS: "Are you coughing up any blood?" If so ask: "How much?" (flecks, streaks, tablespoons, etc.)     No 5. DIFFICULTY BREATHING: "Are you having difficulty breathing?" If Yes, ask: "How bad is it?" (e.g., mild, moderate, severe)    - MILD: No SOB at rest, mild SOB with walking, speaks normally in sentences, can lie down, no retractions, pulse < 100.    - MODERATE: SOB at rest, SOB with minimal exertion and prefers to sit, cannot lie down flat, speaks in phrases, mild retractions, audible wheezing, pulse 100-120.    - SEVERE: Very SOB at rest, speaks in single words, struggling to breathe, sitting hunched forward, retractions, pulse > 120      Mild  6. FEVER: "Do you have a fever?" If Yes, ask: "What is your temperature, how was it measured, and when did it start?"     Yes - 99.5 7. CARDIAC HISTORY: "Do you have any history of heart disease?" (e.g., heart attack, congestive heart failure)      No 8. LUNG HISTORY: "Do you have any history of lung disease?"  (e.g., pulmonary embolus, asthma,  emphysema)     No 9. PE RISK FACTORS: "Do you have a history of blood clots?" (or: recent major surgery, recent prolonged travel, bedridden)     No 10. OTHER SYMPTOMS: "Do you have any other symptoms?" (e.g., runny nose, wheezing, chest pain)       Wheezing 11. PREGNANCY: "Is there any chance you are pregnant?" "When was your last menstrual period?"       No 12. TRAVEL: "Have you traveled out of the country in the last month?" (e.g., travel history, exposures)       No  Protocols used: Cough - Acute Non-Productive-A-AH

## 2022-03-19 ENCOUNTER — Encounter: Payer: Self-pay | Admitting: Internal Medicine

## 2022-03-19 ENCOUNTER — Telehealth (INDEPENDENT_AMBULATORY_CARE_PROVIDER_SITE_OTHER): Payer: Medicare Other | Admitting: Physician Assistant

## 2022-03-19 ENCOUNTER — Encounter: Payer: Self-pay | Admitting: Physician Assistant

## 2022-03-19 DIAGNOSIS — J45901 Unspecified asthma with (acute) exacerbation: Secondary | ICD-10-CM

## 2022-03-19 DIAGNOSIS — J209 Acute bronchitis, unspecified: Secondary | ICD-10-CM | POA: Diagnosis not present

## 2022-03-19 MED ORDER — PREDNISONE 20 MG PO TABS: 20.0000 mg | ORAL_TABLET | Freq: Every day | ORAL | 0 refills | Status: DC

## 2022-03-19 MED ORDER — FLUTICASONE-SALMETEROL 250-50 MCG/ACT IN AEPB: 1.0000 | INHALATION_SPRAY | Freq: Two times a day (BID) | RESPIRATORY_TRACT | 2 refills | Status: AC

## 2022-03-19 MED ORDER — VENTOLIN HFA 108 (90 BASE) MCG/ACT IN AERS: INHALATION_SPRAY | RESPIRATORY_TRACT | 3 refills | Status: DC

## 2022-03-19 MED ORDER — HYDROCOD POLI-CHLORPHE POLI ER 10-8 MG/5ML PO SUER: 5.0000 mL | Freq: Every evening | ORAL | 0 refills | Status: DC | PRN

## 2022-03-19 MED ORDER — DOXYCYCLINE HYCLATE 100 MG PO TABS: 100.0000 mg | ORAL_TABLET | Freq: Two times a day (BID) | ORAL | 0 refills | Status: DC

## 2022-03-19 NOTE — Telephone Encounter (Signed)
Scheduled to see lindsay today

## 2022-03-27 ENCOUNTER — Other Ambulatory Visit: Payer: Self-pay | Admitting: Physician Assistant

## 2022-03-27 DIAGNOSIS — G43809 Other migraine, not intractable, without status migrainosus: Secondary | ICD-10-CM

## 2022-04-09 ENCOUNTER — Encounter: Payer: Self-pay | Admitting: Physician Assistant

## 2022-04-10 ENCOUNTER — Encounter: Payer: Self-pay | Admitting: Internal Medicine

## 2022-04-14 ENCOUNTER — Encounter: Payer: Self-pay | Admitting: Physician Assistant

## 2022-04-18 ENCOUNTER — Encounter: Payer: Self-pay | Admitting: Internal Medicine

## 2022-04-18 DIAGNOSIS — G4733 Obstructive sleep apnea (adult) (pediatric): Secondary | ICD-10-CM | POA: Diagnosis not present

## 2022-04-22 ENCOUNTER — Telehealth: Payer: Self-pay

## 2022-04-22 NOTE — Telephone Encounter (Signed)
Copied from [place] #[id number]. Topic: Appointment Scheduling - Scheduling Inquiry for Clinic >> Apr 22, 2022 12:10 PM Erskine Squibb wrote: Reason for CRM: The patient called in wanting to reschedule her physical she had scheduled for last month and had to cancel for insurance purposes. She is now on Medicare and wants to schedule her physical for March either the week of the 18th or 25th. Please assist patient further.

## 2022-04-25 ENCOUNTER — Inpatient Hospital Stay: Payer: Medicare Other

## 2022-04-25 ENCOUNTER — Inpatient Hospital Stay: Payer: Medicare Other | Admitting: Internal Medicine

## 2022-05-07 ENCOUNTER — Encounter: Payer: Self-pay | Admitting: Internal Medicine

## 2022-05-07 ENCOUNTER — Other Ambulatory Visit: Payer: Self-pay | Admitting: Physician Assistant

## 2022-05-07 ENCOUNTER — Encounter: Payer: Self-pay | Admitting: Physician Assistant

## 2022-05-07 DIAGNOSIS — Z1231 Encounter for screening mammogram for malignant neoplasm of breast: Secondary | ICD-10-CM

## 2022-05-20 ENCOUNTER — Other Ambulatory Visit: Payer: Self-pay | Admitting: Physician Assistant

## 2022-05-20 DIAGNOSIS — M5030 Other cervical disc degeneration, unspecified cervical region: Secondary | ICD-10-CM

## 2022-05-20 MED ORDER — OXYCODONE-ACETAMINOPHEN 5-325 MG PO TABS: ORAL_TABLET | ORAL | 0 refills | Status: DC

## 2022-05-21 ENCOUNTER — Telehealth: Payer: Self-pay | Admitting: Physician Assistant

## 2022-05-21 NOTE — Telephone Encounter (Signed)
Sabrina Morris with [employer] called and said that the rx was faxed for oxyCODONE-acetaminophen (PERCOCET/ROXICET) 5-325 MG tablet  and that it needs to be sent electronically. Please resend.

## 2022-05-22 ENCOUNTER — Other Ambulatory Visit: Payer: Self-pay | Admitting: Physician Assistant

## 2022-05-22 DIAGNOSIS — M5030 Other cervical disc degeneration, unspecified cervical region: Secondary | ICD-10-CM

## 2022-05-22 MED ORDER — OXYCODONE-ACETAMINOPHEN 5-325 MG PO TABS: ORAL_TABLET | ORAL | 0 refills | Status: DC

## 2022-05-28 ENCOUNTER — Encounter: Payer: Self-pay | Admitting: Internal Medicine

## 2022-05-30 ENCOUNTER — Encounter: Payer: Self-pay | Admitting: Internal Medicine

## 2022-06-02 ENCOUNTER — Telehealth: Payer: Self-pay | Admitting: Physician Assistant

## 2022-06-02 ENCOUNTER — Other Ambulatory Visit: Payer: Self-pay

## 2022-06-02 DIAGNOSIS — F419 Anxiety disorder, unspecified: Secondary | ICD-10-CM

## 2022-06-02 MED ORDER — LORAZEPAM 1 MG PO TABS: ORAL_TABLET | ORAL | 1 refills | Status: DC

## 2022-06-02 NOTE — Telephone Encounter (Signed)
[facility] faxed refill request for the following medications:   LORazepam (ATIVAN) 1 MG tablet    Please advise

## 2022-06-03 ENCOUNTER — Encounter: Payer: Self-pay | Admitting: Internal Medicine

## 2022-06-03 DIAGNOSIS — K080 Exfoliation of teeth due to systemic causes: Secondary | ICD-10-CM | POA: Diagnosis not present

## 2022-06-04 ENCOUNTER — Encounter: Payer: Self-pay | Admitting: Internal Medicine

## 2022-06-05 NOTE — Progress Notes (Deleted)
I,Sha'taria Zyiah Withington,acting as a Education administrator for [employer], PA-C.,have documented all relevant documentation on the behalf of [name], PA-C,as directed by  [name], PA-C while in the presence of [name], PA-C.   Complete physical exam   Patient: Sabrina Morris   DOB: Mar 10, 1966   57 y.o. Female  MRN: [id number] Visit Date: 06/06/2022  Today's healthcare provider: [name], PA-C   No chief complaint on file.  Subjective    Sabrina Morris is a 57 y.o. female who presents today for a complete physical exam.  She reports consuming a {diet types:17450} diet. {Exercise:19826} She generally feels {well/fairly well/poorly:18703}. She reports sleeping {well/fairly well/poorly:18703}. She {does/does not:200015} have additional problems to discuss today.  HPI  ***  Past Medical History:  Diagnosis Date   Adrenal disorder ([place])    Anemia 11/2018   Breast cancer ([place])    H/O bone density study 2015   H/O colonoscopy    sch 06/12/15   H/O colonoscopy 11/2018   H/O endoscopy 11/2018   upper   Lymphedema    Lymphedema    Migraine    Pap smear for cervical cancer screening [id number]   Postmenopausal 07/02/2015   Past Surgical History:  Procedure Laterality Date   breast  Left 02/18/2017   Implant removed   BREAST ENHANCEMENT SURGERY Bilateral 2005   [place] Right 02/2011   paraesophageal hernia  2021   PORT A CATH INJECTION ([place] HX)  2013   Social History   Socioeconomic History   Marital status: Divorced    Spouse name: Not on file   Number of children: 0   Years of education: Not on file   Highest education level: Associate degree: occupational, Hotel manager, or vocational program  Occupational History   Occupation: Therapist, sports    Comment: part time   Occupation: disability  Tobacco Use   Smoking status: Never   Smokeless tobacco: Never  Vaping Use   Vaping Use: Never used  Substance and  Sexual Activity   Alcohol use: No   Drug use: No   Sexual activity: Not Currently    Birth control/protection: Post-menopausal  Other Topics Concern   Not on file  Social History Narrative   Disability [sec to [place]; 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  No smoking or alcohol. Lives at home/with sister.    Social Determinants of Health   Financial Resource Strain: Medium Risk (06/18/2020)   Overall Financial Resource Strain (CARDIA)    Difficulty of Paying Living Expenses: Somewhat hard  Food Insecurity: No Food Insecurity (06/18/2020)   Hunger Vital Sign    Worried About Running Out of Food in the Last Year: Never true    Ran Out of Food in the Last Year: Never true  Transportation Needs: No Transportation Needs (06/18/2020)   PRAPARE - Hydrologist (Medical): No    Lack of Transportation (Non-Medical): No  Physical Activity: Inactive (06/18/2020)   Exercise Vital Sign    Days of Exercise per Week: 0 days    Minutes of Exercise per Session: 0 min  Stress: Stress Concern Present (06/18/2020)   [place]    Feeling of Stress : Rather much  Social Connections: Socially Isolated (06/18/2020)   Social Connection and Isolation Panel [NHANES]    Frequency of Communication with Friends and Family: More  than three times a week    Frequency of Social Gatherings with Friends and Family: Three times a week    Attends Religious Services: Never    Active Member of Clubs or Organizations: No    Attends Archivist Meetings: Never    Marital Status: Divorced  Human resources officer Violence: Not At Risk (06/18/2020)   Humiliation, Afraid, Rape, and Kick questionnaire    Fear of Current or Ex-Partner: No    Emotionally Abused: No    Physically Abused: No    Sexually Abused: No   Family Status  Relation Name Status   Sister  Alive   Family History  Adopted: Yes  Problem  Relation Age of Onset   Breast cancer Sister 42       1/2 sister; Genetic testing neg at [facility]   Allergies  Allergen Reactions   Sumatriptan Other (See Comments) and Shortness Of Breath   Feraheme [Ferumoxytol] Nausea Only    Nausea fatigue 1 hour post infusion   Haemophilus Influenzae Vaccines    Other Swelling    INFLUENZA   Sulfa Antibiotics Other (See Comments)    GI distress   Sulfamethoxazole Other (See Comments)   Tamoxifen Other (See Comments)    Edema, excess fluid   Zoladex  [Goserelin]    Codeine Other (See Comments) and Rash    Hyper, increased heart rate   Hydrocodone-Acetaminophen Rash    Hyperactivity   Topiramate Other (See Comments)    "Messed with mind"    Patient Care Team: [name], PA-C as PCP - General (Physician Assistant) Ellwood Handler., MD (Gastroenterology) Pa, [place], [place] (Obstetrics and Gynecology) Cammie Sickle, MD as Consulting Physician (Oncology)   Medications: Outpatient Medications Prior to Visit  Medication Sig   butalbital-acetaminophen-caffeine (FIORICET) 50-325-40 MG tablet TAKE 1 TO 2 TABLETS BY MOUTH 2 TIMES DAILY AS NEEDED FOR HEADACHE   calcium carbonate 100 mg/ml SUSP Take by mouth.   Calcium-Magnesium-Vitamin D [id number] MG-MG-UNIT TABS Take by mouth. Alternates between 1 tablet once daily and two tablets daily.   chlorpheniramine-HYDROcodone (TUSSIONEX) 10-8 MG/5ML Take 5 mLs by mouth at bedtime as needed for cough.   Cholecalciferol (VITAMIN D3) 5000 units TABS Take by mouth daily at 6 (six) AM.   cyclobenzaprine (FLEXERIL) 5 MG tablet TAKE 1 TABLET BY MOUTH 3 TIMES A DAY AS NEEDED   doxycycline (VIBRA-TABS) 100 MG tablet Take 1 tablet (100 mg total) by mouth 2 (two) times daily.   fluocinonide (LIDEX) 0.05 % external solution Apply 1 application topically 2 (two) times daily.   fluticasone-salmeterol (WIXELA INHUB) 250-50 MCG/ACT AEPB Inhale 1 puff into the lungs in the  morning and at bedtime.   furosemide (LASIX) 20 MG tablet TAKE 2 TABLETS BY MOUTH EVERY DAY   LORazepam (ATIVAN) 1 MG tablet TAKE 1/2 TABLET BY MOUTH 2 TIMES DAILY AS NEEDED FOR ANXIETY   meloxicam (MOBIC) 15 MG tablet Take 1 tablet (15 mg total) by mouth daily.   Multiple Vitamin (MULTIVITAMIN) capsule Take 1 capsule by mouth daily.   nystatin ointment (MYCOSTATIN) Apply 1 application topically 2 (two) times daily. At mastectomy site as needed   oxyCODONE-acetaminophen (PERCOCET/ROXICET) 5-325 MG tablet TAKE 1 TABLET BY MOUTH EVERY 8 HOURS AS NEEDED FOR SEVERE PAIN   phentermine (ADIPEX-P) 37.5 MG tablet Take 1 tablet (37.5 mg total) by mouth daily before breakfast.   potassium chloride (KLOR-CON) 10 MEQ tablet TAKE 1 TABLET BY MOUTH DAILY   predniSONE (  DELTASONE) 20 MG tablet Take 1 tablet (20 mg total) by mouth daily with breakfast.   promethazine (PHENERGAN) 12.5 MG tablet Take 1 tablet (12.5 mg total) by mouth every 8 (eight) hours as needed for nausea or vomiting.   rizatriptan (MAXALT) 10 MG tablet TAKE 1 TABLET BY MOUTH AT FIRST SIGN OF MIGRAINE SYMPTOMS. IF NO RELIEF, A SECOND TABLET MAY BE TAKEN IN 2 HOURS. LIMIT OF 2 TABLETS PER DAY   valACYclovir (VALTREX) 500 MG tablet TAKE 1 TABLET BY MOUTH 2 TIMES DAILY AS NEEDED FOR FEVER BLISTERS   VENTOLIN HFA 108 (90 Base) MCG/ACT inhaler INHALE 2 PUFFS BY MOUTH EVERY 4 TO 6 HOURS AS NEEDED   No facility-administered medications prior to visit.    Review of Systems  {Labs  Heme  Chem  Endocrine  Serology  Results Review (optional):23779}  Objective    There were no vitals taken for this visit. {Show previous vital signs (optional):23777}   Physical Exam  ***  Last depression screening scores    01/20/2022    2:00 PM 04/11/2021    8:47 AM 01/01/2021    9:32 AM  PHQ 2/9 Scores  PHQ - 2 Score 2 1 0  PHQ- 9 Score 9 8 8    Last fall risk screening    01/20/2022    2:00 PM  [place] in the past year? 1  Number  falls in past yr: 1  Injury with Fall? 1  Risk for fall due to : History of fall(s)  Follow up Falls evaluation completed   Last Audit-C alcohol use screening    01/20/2022    2:00 PM  Alcohol Use Disorder Test (AUDIT)  1. How often do you have a drink containing alcohol? 1  2. How many drinks containing alcohol do you have on a typical day when you are drinking? 0  3. How often do you have six or more drinks on one occasion? 0  AUDIT-C Score 1   A score of 3 or more in women, and 4 or more in men indicates increased risk for alcohol abuse, EXCEPT if all of the points are from question 1   No results found for any visits on 06/06/22.  Assessment & Plan    Routine Health Maintenance and Physical Exam  Exercise Activities and Dietary recommendations  Goals      Prevent falls     Recommend to remove any items from the home that may cause slips or trips.         There is no immunization history on file for this patient.  Health Maintenance  Topic Date Due   COVID-19 Vaccine (1) Never done   DTaP/Tdap/Td (1 - Tdap) Never done   Zoster Vaccines- Shingrix (1 of 2) Never done   PAP SMEAR-Modifier  05/29/2021   INFLUENZA VACCINE  Never done   MAMMOGRAM  01/21/2023 (Originally 10/24/2021)   COLONOSCOPY (Pts 45-50yrs Insurance coverage will need to be confirmed)  01/21/2023 (Originally 12/01/2021)   HIV Screening  Completed   HPV VACCINES  Aged Out    Discussed health benefits of physical activity, and encouraged her to engage in regular exercise appropriate for her age and condition.  ***  No follow-ups on file.     {provider attestation***:1}   [name], PA-C  [place] [phone] (phone) [phone] (fax)  [place]

## 2022-06-06 ENCOUNTER — Encounter: Payer: Medicare Other | Admitting: Physician Assistant

## 2022-06-09 NOTE — Progress Notes (Unsigned)
I,J'ya E Hunter,acting as a scribe for [employer], PA-C.,have documented all relevant documentation on the behalf of [name], PA-C,as directed by  [name], PA-C while in the presence of [name], PA-C.   Complete physical exam   Patient: Sabrina Morris   DOB: 01-14-1966   57 y.o. Female  MRN: [id number] Visit Date: 06/11/2022  Today's healthcare provider: [name], PA-C   Cc. Cpe, fatigue, weight gain  Subjective    Sabrina Morris is a 57 y.o. female who presents today for a complete physical exam.  She reports consuming a general diet. Home exercise routine includes walking 1 mile hrs per day. She generally feels fairly well. She reports sleeping poorly. She does not have additional problems to discuss today.   Pt has an appointment with [facility] tomorrow for Pap smear. She declined influenza and covid-19 vaccine.   Weight gain -Combination of weight from fat as well as overall her lymphedema. Pt is unable to do injections like wegovy as any injection or needle stick flares her lymphedema.  She reports trying phentermine before and was able to lose ~20 pounds from it.  Fatigue -Pt reports wanting to exercise, do more, but even ~1 mile walk zaps all her energy and she needs to recover for a few days. She wishes she could do more.  Past Medical History:  Diagnosis Date   Adrenal disorder ([place])    Anemia 11/2018   Breast cancer ([place])    H/O bone density study 2015   H/O colonoscopy    sch 06/12/15   H/O colonoscopy 11/2018   H/O endoscopy 11/2018   upper   Lymphedema    Lymphedema    Migraine    Pap smear for cervical cancer screening [id number]   Postmenopausal 07/02/2015   Past Surgical History:  Procedure Laterality Date   breast  Left 02/18/2017   Implant removed   BREAST ENHANCEMENT SURGERY Bilateral 2005   [place] Right 02/2011   paraesophageal hernia  2021    PORT A CATH INJECTION ([place] HX)  2013   Social History   Socioeconomic History   Marital status: Divorced    Spouse name: Not on file   Number of children: 0   Years of education: Not on file   Highest education level: Associate degree: occupational, Hotel manager, or vocational program  Occupational History   Occupation: Therapist, sports    Comment: part time   Occupation: disability  Tobacco Use   Smoking status: Never   Smokeless tobacco: Never  Vaping Use   Vaping Use: Never used  Substance and Sexual Activity   Alcohol use: No   Drug use: No   Sexual activity: Not Currently    Birth control/protection: Post-menopausal  Other Topics Concern   Not on file  Social History Narrative   Disability [sec to [place]; 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  No smoking or alcohol. Lives at home/with sister.    Social Determinants of Health   Financial Resource Strain: Medium Risk (06/18/2020)   Overall Financial Resource Strain (CARDIA)    Difficulty of Paying Living Expenses: Somewhat hard  Food Insecurity: No Food Insecurity (06/18/2020)   Hunger Vital Sign    Worried About Running Out of Food in the Last Year: Never true    Ran Out of Food in the Last Year: Never true  Transportation Needs: No Transportation Needs (  06/18/2020)   PRAPARE - Hydrologist (Medical): No    Lack of Transportation (Non-Medical): No  Physical Activity: Inactive (06/18/2020)   Exercise Vital Sign    Days of Exercise per Week: 0 days    Minutes of Exercise per Session: 0 min  Stress: Stress Concern Present (06/18/2020)   [place]    Feeling of Stress : Rather much  Social Connections: Socially Isolated (06/18/2020)   Social Connection and Isolation Panel [NHANES]    Frequency of Communication with Friends and Family: More than three times a week    Frequency of Social Gatherings with Friends and Family: Three  times a week    Attends Religious Services: Never    Active Member of Clubs or Organizations: No    Attends Archivist Meetings: Never    Marital Status: Divorced  Human resources officer Violence: Not At Risk (06/18/2020)   Humiliation, Afraid, Rape, and Kick questionnaire    Fear of Current or Ex-Partner: No    Emotionally Abused: No    Physically Abused: No    Sexually Abused: No   Family Status  Relation Name Status   Sister  Alive   Family History  Adopted: Yes  Problem Relation Age of Onset   Breast cancer Sister 46       1/2 sister; Genetic testing neg at [facility]   Allergies  Allergen Reactions   Sumatriptan Other (See Comments) and Shortness Of Breath   Feraheme [Ferumoxytol] Nausea Only    Nausea fatigue 1 hour post infusion   Haemophilus Influenzae Vaccines    Other Swelling    INFLUENZA   Sulfa Antibiotics Other (See Comments)    GI distress   Sulfamethoxazole Other (See Comments)   Tamoxifen Other (See Comments)    Edema, excess fluid   Zoladex  [Goserelin]    Codeine Other (See Comments) and Rash    Hyper, increased heart rate   Hydrocodone-Acetaminophen Rash    Hyperactivity   Topiramate Other (See Comments)    "Messed with mind"    Patient Care Team: [name], PA-C as PCP - General (Physician Assistant) Ellwood Handler., MD (Gastroenterology) Pa, [place], [place] (Obstetrics and Gynecology) Cammie Sickle, MD as Consulting Physician (Oncology)   Medications: Outpatient Medications Prior to Visit  Medication Sig   butalbital-acetaminophen-caffeine (FIORICET) 50-325-40 MG tablet TAKE 1 TO 2 TABLETS BY MOUTH 2 TIMES DAILY AS NEEDED FOR HEADACHE   calcium carbonate 100 mg/ml SUSP Take by mouth.   Calcium-Magnesium-Vitamin D [phone] MG-MG-UNIT TABS Take by mouth. Alternates between 1 tablet once daily and two tablets daily.   Cholecalciferol (VITAMIN D3) 5000 units TABS Take by mouth daily at 6 (six)  AM.   cyclobenzaprine (FLEXERIL) 5 MG tablet TAKE 1 TABLET BY MOUTH 3 TIMES A DAY AS NEEDED   fluocinonide (LIDEX) 0.05 % external solution Apply 1 application topically 2 (two) times daily.   fluticasone-salmeterol (WIXELA INHUB) 250-50 MCG/ACT AEPB Inhale 1 puff into the lungs in the morning and at bedtime.   furosemide (LASIX) 20 MG tablet TAKE 2 TABLETS BY MOUTH EVERY DAY   LORazepam (ATIVAN) 1 MG tablet TAKE 1/2 TABLET BY MOUTH 2 TIMES DAILY AS NEEDED FOR ANXIETY   meloxicam (MOBIC) 15 MG tablet Take 1 tablet (15 mg total) by mouth daily.   Multiple Vitamin (MULTIVITAMIN) capsule Take 1 capsule by mouth daily.   nystatin ointment (MYCOSTATIN) Apply  1 application topically 2 (two) times daily. At mastectomy site as needed   oxyCODONE-acetaminophen (PERCOCET/ROXICET) 5-325 MG tablet TAKE 1 TABLET BY MOUTH EVERY 8 HOURS AS NEEDED FOR SEVERE PAIN   potassium chloride (KLOR-CON) 10 MEQ tablet TAKE 1 TABLET BY MOUTH DAILY   promethazine (PHENERGAN) 12.5 MG tablet Take 1 tablet (12.5 mg total) by mouth every 8 (eight) hours as needed for nausea or vomiting.   rizatriptan (MAXALT) 10 MG tablet TAKE 1 TABLET BY MOUTH AT FIRST SIGN OF MIGRAINE SYMPTOMS. IF NO RELIEF, A SECOND TABLET MAY BE TAKEN IN 2 HOURS. LIMIT OF 2 TABLETS PER DAY   valACYclovir (VALTREX) 500 MG tablet TAKE 1 TABLET BY MOUTH 2 TIMES DAILY AS NEEDED FOR FEVER BLISTERS   VENTOLIN HFA 108 (90 Base) MCG/ACT inhaler INHALE 2 PUFFS BY MOUTH EVERY 4 TO 6 HOURS AS NEEDED   [DISCONTINUED] phentermine (ADIPEX-P) 37.5 MG tablet Take 1 tablet (37.5 mg total) by mouth daily before breakfast.   [DISCONTINUED] chlorpheniramine-HYDROcodone (TUSSIONEX) 10-8 MG/5ML Take 5 mLs by mouth at bedtime as needed for cough.   [DISCONTINUED] doxycycline (VIBRA-TABS) 100 MG tablet Take 1 tablet (100 mg total) by mouth 2 (two) times daily.   [DISCONTINUED] predniSONE (DELTASONE) 20 MG tablet Take 1 tablet (20 mg total) by mouth daily with breakfast.   No  facility-administered medications prior to visit.    Review of Systems  Constitutional:  Positive for fatigue and unexpected weight change.  Gastrointestinal:  Positive for nausea.  Neurological:  Positive for light-headedness (on occasion) and headaches.    Objective    BP 129/86 (BP Location: Left Arm, Patient Position: Sitting, Cuff Size: Large)   Pulse 99   Temp 97.8 F (36.6 C) (Oral)   Resp 12   Ht 5\' 6"  (1.676 m)   Wt 225 lb 6.4 oz (102.2 kg)   SpO2 99%   BMI 36.38 kg/m     Physical Exam Constitutional:      General: She is awake.     Appearance: She is well-developed. She is not ill-appearing.  HENT:     Head: Normocephalic.     Right Ear: Tympanic membrane normal.     Left Ear: Tympanic membrane normal.     Nose: Nose normal. No congestion or rhinorrhea.     Mouth/Throat:     Pharynx: No oropharyngeal exudate or posterior oropharyngeal erythema.  Eyes:     Conjunctiva/sclera: Conjunctivae normal.     Pupils: Pupils are equal, round, and reactive to light.  Neck:     Thyroid: No thyroid mass or thyromegaly.     Comments: B/l symmetric cervical adenopathy; mobile and soft Cardiovascular:     Rate and Rhythm: Normal rate and regular rhythm.     Heart sounds: Normal heart sounds.  Pulmonary:     Effort: Pulmonary effort is normal.     Breath sounds: Normal breath sounds.  Abdominal:     Palpations: Abdomen is soft.     Tenderness: There is no abdominal tenderness.  Musculoskeletal:     Right lower leg: No swelling. No edema.     Left lower leg: No swelling. No edema.  Lymphadenopathy:     Cervical: Cervical adenopathy present.  Skin:    General: Skin is warm.  Neurological:     Mental Status: She is alert and oriented to person, place, and time.  Psychiatric:        Attention and Perception: Attention normal.        Mood and Affect:  Mood normal.        Speech: Speech normal.        Behavior: Behavior normal. Behavior is cooperative.     Last  depression screening scores    06/11/2022    8:46 AM 01/20/2022    2:00 PM 04/11/2021    8:47 AM  PHQ 2/9 Scores  PHQ - 2 Score 0 2 1  PHQ- 9 Score 0 9 8   Last fall risk screening    06/11/2022    8:45 AM  [place] in the past year? 0  Number falls in past yr: 0  Injury with Fall? 0  Risk for fall due to : No Fall Risks   Last Audit-C alcohol use screening    06/11/2022    8:46 AM  Alcohol Use Disorder Test (AUDIT)  1. How often do you have a drink containing alcohol? 0  3. How often do you have six or more drinks on one occasion? 0   A score of 3 or more in women, and 4 or more in men indicates increased risk for alcohol abuse, EXCEPT if all of the points are from question 1   No results found for any visits on 06/11/22.  Assessment & Plan    Routine Health Maintenance and Physical Exam  Exercise Activities and Dietary recommendations --balanced diet high in fiber and protein, low in sugars, carbs, fats. --physical activity/exercise 30 minutes 3-5 times a week     There is no immunization history on file for this patient.  Health Maintenance  Topic Date Due   COVID-19 Vaccine (1) Never done   DTaP/Tdap/Td (1 - Tdap) Never done   Zoster Vaccines- Shingrix (1 of 2) Never done   PAP SMEAR-Modifier  05/29/2021   INFLUENZA VACCINE  Never done   MAMMOGRAM  01/21/2023 (Originally 10/24/2021)   COLONOSCOPY (Pts 45-73yrs Insurance coverage will need to be confirmed)  01/21/2023 (Originally 12/01/2021)   HIV Screening  Completed   HPV VACCINES  Aged Out    Discussed health benefits of physical activity, and encouraged her to engage in regular exercise appropriate for her age and condition.  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  Described the spoon theory      Relevant Orders   US BREAST COMPLETE UNI LEFT INC AXILLA   Iron deficiency anemia due to chronic blood loss    Pt follows with Dr B. Given upcoming appt added necessary labs today to follow      Relevant Orders   Iron, TIBC and Ferritin Panel   Class 2 obesity without serious comorbidity with body mass index (BMI) of 36.0 to 36.9 in adult    Pt does try to exercise, but is limited d/t chronic fatigue and lymphedema. Previous success with phentermine Advised SE, rx 37.5 mg x 90 days.      Relevant Medications   phentermine 37.5 MG capsule  Other Visit Diagnoses     Annual physical exam    -  Primary   Relevant Orders   Lipid panel   Comprehensive Metabolic Panel (CMET)   CBC w/Diff/Platelet   HgB A1c   Vitamin D (25 hydroxy)   Vitamin B12        Return in about 6 months (around 12/12/2022) for chronic conditions.     I, [name], PA-C have reviewed all documentation for this visit. The documentation on 06/11/22  for the exam, diagnosis, procedures, and orders are all accurate and complete.  [name], PA-C [facility] [address] [place], [place], [address] Office: [phone] Fax: [place]

## 2022-06-10 ENCOUNTER — Encounter: Payer: Self-pay | Admitting: Internal Medicine

## 2022-06-11 ENCOUNTER — Inpatient Hospital Stay: Payer: Medicare Other

## 2022-06-11 ENCOUNTER — Encounter: Payer: Self-pay | Admitting: Physician Assistant

## 2022-06-11 ENCOUNTER — Ambulatory Visit (INDEPENDENT_AMBULATORY_CARE_PROVIDER_SITE_OTHER): Payer: Medicare Other | Admitting: Physician Assistant

## 2022-06-11 ENCOUNTER — Encounter: Payer: Self-pay | Admitting: Internal Medicine

## 2022-06-11 ENCOUNTER — Inpatient Hospital Stay: Payer: Medicare Other | Admitting: Internal Medicine

## 2022-06-11 VITALS — BP 129/86 | HR 99 | Temp 97.8°F | Resp 12 | Ht 66.0 in | Wt 225.4 lb

## 2022-06-11 DIAGNOSIS — Z6836 Body mass index (BMI) 36.0-36.9, adult: Secondary | ICD-10-CM

## 2022-06-11 DIAGNOSIS — Z0000 Encounter for general adult medical examination without abnormal findings: Secondary | ICD-10-CM

## 2022-06-11 DIAGNOSIS — D508 Other iron deficiency anemias: Secondary | ICD-10-CM | POA: Insufficient documentation

## 2022-06-11 DIAGNOSIS — E669 Obesity, unspecified: Secondary | ICD-10-CM

## 2022-06-11 DIAGNOSIS — R7303 Prediabetes: Secondary | ICD-10-CM

## 2022-06-11 DIAGNOSIS — D500 Iron deficiency anemia secondary to blood loss (chronic): Secondary | ICD-10-CM

## 2022-06-11 DIAGNOSIS — Z853 Personal history of malignant neoplasm of breast: Secondary | ICD-10-CM

## 2022-06-11 DIAGNOSIS — E7800 Pure hypercholesterolemia, unspecified: Secondary | ICD-10-CM | POA: Diagnosis not present

## 2022-06-11 DIAGNOSIS — I972 Postmastectomy lymphedema syndrome: Secondary | ICD-10-CM

## 2022-06-11 MED ORDER — PHENTERMINE HCL 37.5 MG PO CAPS: 37.5000 mg | ORAL_CAPSULE | ORAL | 0 refills | Status: DC

## 2022-06-11 NOTE — Assessment & Plan Note (Signed)
Repeat A1c 

## 2022-06-11 NOTE — Assessment & Plan Note (Addendum)
Pt does try to exercise, but is limited d/t chronic fatigue and lymphedema. Previous success with phentermine Advised SE, rx 37.5 mg x 90 days.

## 2022-06-11 NOTE — Assessment & Plan Note (Addendum)
Discussed ways to manage chronic fatigue.  Described the spoon theory

## 2022-06-11 NOTE — Assessment & Plan Note (Signed)
Pt follows with Dr B. Given upcoming appt added necessary labs today to follow

## 2022-06-11 NOTE — Progress Notes (Deleted)
No chief complaint on file.   HPI:      Sabrina Morris is a 57 y.o. G0P0000 who LMP was No LMP recorded. Patient is postmenopausal., presents today for her annual examination.  Her menses are absent.  She does not have PMB.   She does have some vasomotor sx. She is using cream form of estradiol, testosterone, and progesterone meds again, prescribed by NP in [facility].  Did better with oral but it was getting stuck in her throat so changed this yr. Had absorption issues with cream in past due to lymphedema so having increased menopausal sx now. Hx of breast cancer--aware of risks but wants meds for quality of life. Hx of dominant estrogen levels and  progesterone and testosterone are low.   She is not sexually active. She does not have vaginal dryness.  Last Pap: 05/29/16  Results were: no abnormalities/neg HPV DNA   Last mammogram: 10/24/20 Results were: normal--routine follow-up in 12 months LT Breast only. Had RT mastectomy in past.  There is a FH of breast cancer in her mat 1/2 sister, age 59. She had neg genetic panel testing at [facility]. Pt had neg BRCA testing at [facility] in the past, no panel testing done. There is no FH of ovarian cancer. Pt is adopted. The patient does do self-breast exams.   Colonoscopy: colonoscopy 9/20 at [facility] with abnormalities. Repeat due after 3-5 yrs. DEXA: 1/22 at [facility]; osteopenia in spine and hip  Tobacco use: The patient denies current or previous tobacco use. Alcohol use: none No drug use. Exercise: not active   She does get adequate calcium and Vitamin D in her diet.   Labs with PCP. Hx of pre-DM, borderline lipids.    Past Medical History:  Diagnosis Date   Adrenal disorder ([place])    Anemia 11/2018   Breast cancer ([place])    H/O bone density study 2015   H/O colonoscopy    sch 06/12/15   H/O colonoscopy 11/2018   H/O endoscopy 11/2018   upper   Lymphedema    Lymphedema    Migraine    Pap smear for cervical cancer screening [id number]   Postmenopausal  07/02/2015    Past Surgical History:  Procedure Laterality Date   breast  Left 02/18/2017   Implant removed   BREAST ENHANCEMENT SURGERY Bilateral 2005   [place] Right 02/2011   paraesophageal hernia  2021   PORT A CATH INJECTION ([facility])  2013    Family History  Adopted: Yes  Problem Relation Age of Onset   Breast cancer Sister 66       1/2 sister; Genetic testing neg at [facility]    Social History   Socioeconomic History   Marital status: Divorced    Spouse name: Not on file   Number of children: 0   Years of education: Not on file   Highest education level: Associate degree: occupational, Hotel manager, or vocational program  Occupational History   Occupation: Therapist, sports    Comment: part time   Occupation: disability  Tobacco Use   Smoking status: Never   Smokeless tobacco: Never  Vaping Use   Vaping Use: Never used  Substance and Sexual Activity   Alcohol use: No   Drug use: No   Sexual activity: Not Currently    Birth control/protection: Post-menopausal  Other Topics Concern   Not on file  Social History Narrative   Disability [sec  to [employer]; 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  No smoking or alcohol. Lives at home/with sister.    Social Determinants of Health   Financial Resource Strain: Medium Risk (06/18/2020)   Overall Financial Resource Strain (CARDIA)    Difficulty of Paying Living Expenses: Somewhat hard  Food Insecurity: No Food Insecurity (06/18/2020)   Hunger Vital Sign    Worried About Running Out of Food in the Last Year: Never true    Ran Out of Food in the Last Year: Never true  Transportation Needs: No Transportation Needs (06/18/2020)   PRAPARE - Hydrologist (Medical): No    Lack of Transportation (Non-Medical): No  Physical Activity: Inactive (06/18/2020)   Exercise Vital Sign    Days of Exercise per Week: 0 days    Minutes of Exercise  per Session: 0 min  Stress: Stress Concern Present (06/18/2020)   [place]    Feeling of Stress : Rather much  Social Connections: Socially Isolated (06/18/2020)   Social Connection and Isolation Panel [NHANES]    Frequency of Communication with Friends and Family: More than three times a week    Frequency of Social Gatherings with Friends and Family: Three times a week    Attends Religious Services: Never    Active Member of Clubs or Organizations: No    Attends Archivist Meetings: Never    Marital Status: Divorced  Human resources officer Violence: Not At Risk (06/18/2020)   Humiliation, Afraid, Rape, and Kick questionnaire    Fear of Current or Ex-Partner: No    Emotionally Abused: No    Physically Abused: No    Sexually Abused: No    Current Outpatient Medications on File Prior to Visit  Medication Sig Dispense Refill   butalbital-acetaminophen-caffeine (FIORICET) 50-325-40 MG tablet TAKE 1 TO 2 TABLETS BY MOUTH 2 TIMES DAILY AS NEEDED FOR HEADACHE 60 tablet 1   calcium carbonate 100 mg/ml SUSP Take by mouth.     Calcium-Magnesium-Vitamin D [phone] MG-MG-UNIT TABS Take by mouth. Alternates between 1 tablet once daily and two tablets daily.     Cholecalciferol (VITAMIN D3) 5000 units TABS Take by mouth daily at 6 (six) AM.     cyclobenzaprine (FLEXERIL) 5 MG tablet TAKE 1 TABLET BY MOUTH 3 TIMES A DAY AS NEEDED 90 tablet 1   fluocinonide (LIDEX) 0.05 % external solution Apply 1 application topically 2 (two) times daily. 60 mL 3   fluticasone-salmeterol (WIXELA INHUB) 250-50 MCG/ACT AEPB Inhale 1 puff into the lungs in the morning and at bedtime. 60 each 2   furosemide (LASIX) 20 MG tablet TAKE 2 TABLETS BY MOUTH EVERY DAY 180 tablet 0   LORazepam (ATIVAN) 1 MG tablet TAKE 1/2 TABLET BY MOUTH 2 TIMES DAILY AS NEEDED FOR ANXIETY 30 tablet 1   meloxicam (MOBIC) 15 MG tablet Take 1 tablet (15 mg total) by mouth daily.  30 tablet 3   Multiple Vitamin (MULTIVITAMIN) capsule Take 1 capsule by mouth daily.     nystatin ointment (MYCOSTATIN) Apply 1 application topically 2 (two) times daily. At mastectomy site as needed     oxyCODONE-acetaminophen (PERCOCET/ROXICET) 5-325 MG tablet TAKE 1 TABLET BY MOUTH EVERY 8 HOURS AS NEEDED FOR SEVERE PAIN 15 tablet 0   phentermine 37.5 MG capsule Take 1 capsule (37.5 mg total) by mouth every morning. 90 capsule 0   potassium chloride (KLOR-CON) 10 MEQ tablet TAKE 1  TABLET BY MOUTH DAILY 90 tablet 1   promethazine (PHENERGAN) 12.5 MG tablet Take 1 tablet (12.5 mg total) by mouth every 8 (eight) hours as needed for nausea or vomiting. 30 tablet 0   rizatriptan (MAXALT) 10 MG tablet TAKE 1 TABLET BY MOUTH AT FIRST SIGN OF MIGRAINE SYMPTOMS. IF NO RELIEF, A SECOND TABLET MAY BE TAKEN IN 2 HOURS. LIMIT OF 2 TABLETS PER DAY 30 tablet 2   valACYclovir (VALTREX) 500 MG tablet TAKE 1 TABLET BY MOUTH 2 TIMES DAILY AS NEEDED FOR FEVER BLISTERS 30 tablet 5   VENTOLIN HFA 108 (90 Base) MCG/ACT inhaler INHALE 2 PUFFS BY MOUTH EVERY 4 TO 6 HOURS AS NEEDED 18 g 3   No current facility-administered medications on file prior to visit.      ROS:  Review of Systems  Constitutional:  Negative for fatigue, fever and unexpected weight change.  Respiratory:  Negative for cough, shortness of breath and wheezing.   Cardiovascular:  Negative for chest pain, palpitations and leg swelling.  Gastrointestinal:  Negative for blood in stool, constipation, diarrhea, nausea and vomiting.  Endocrine: Negative for cold intolerance, heat intolerance and polyuria.  Genitourinary:  Negative for dyspareunia, dysuria, flank pain, frequency, genital sores, hematuria, menstrual problem, pelvic pain, urgency, vaginal bleeding, vaginal discharge and vaginal pain.  Musculoskeletal:  Negative for back pain, joint swelling and myalgias.  Skin:  Negative for rash.  Neurological:  Negative for dizziness, syncope,  light-headedness, numbness and headaches.  Hematological:  Negative for adenopathy.  Psychiatric/Behavioral:  Negative for agitation, confusion, sleep disturbance and suicidal ideas. The patient is not nervous/anxious.      Objective: There were no vitals taken for this visit.   Physical Exam Constitutional:      Appearance: She is well-developed.  Genitourinary:     Vulva normal.     Right Labia: No rash, tenderness or lesions.    Left Labia: No tenderness, lesions or rash.    No vaginal discharge, erythema or tenderness.      Right Adnexa: not tender and no mass present.    Left Adnexa: not tender and no mass present.    No cervical friability or polyp.     Uterus is not enlarged or tender.  Breasts:    Right: Absent. No mass or skin change.     Left: No mass, nipple discharge, skin change or tenderness.  Neck:     Thyroid: No thyromegaly.  Cardiovascular:     Rate and Rhythm: Normal rate and regular rhythm.     Heart sounds: Normal heart sounds. No murmur heard. Pulmonary:     Effort: Pulmonary effort is normal.     Breath sounds: Normal breath sounds.  Abdominal:     Palpations: Abdomen is soft.     Tenderness: There is no abdominal tenderness. There is no guarding or rebound.  Musculoskeletal:        General: Normal range of motion.     Cervical back: Normal range of motion.  Lymphadenopathy:     Cervical: No cervical adenopathy.  Neurological:     General: No focal deficit present.     Mental Status: She is alert and oriented to person, place, and time.     Cranial Nerves: No cranial nerve deficit.  Skin:    General: Skin is warm and dry.  Psychiatric:        Mood and Affect: Mood normal.        Behavior: Behavior normal.  Thought Content: Thought content normal.        Judgment: Judgment normal.  Vitals reviewed.     Assessment/Plan: Encounter for annual routine gynecological examination  Encounter for screening mammogram for malignant neoplasm  of breast - Plan: MM 3D SCREEN BREAST UNI LEFT; pt to sched mammo  History of breast cancer--MyRisk update testing offered, pt declines for now. Will f/u if desires.   Vasomotor symptoms due to menopause--pt followed by NP in [facility]. Aware of risks of HRT and breast cancer hx.            GYN counsel breast self exam, mammography screening, adequate intake of calcium and vitamin D, diet and exercise     F/U  No follow-ups on file.  Marcianna Daily B. Ziara Thelander, PA-C 06/11/2022 12:39 PM

## 2022-06-11 NOTE — Assessment & Plan Note (Signed)
Pt not on a statin Repeat fasting lipids The 10-year ASCVD risk score (Arnett DK, et al., 2019) is: 2.9%

## 2022-06-12 ENCOUNTER — Ambulatory Visit: Payer: Medicare Other | Admitting: Obstetrics and Gynecology

## 2022-06-12 ENCOUNTER — Other Ambulatory Visit: Payer: Self-pay | Admitting: Physician Assistant

## 2022-06-12 DIAGNOSIS — Z853 Personal history of malignant neoplasm of breast: Secondary | ICD-10-CM

## 2022-06-12 DIAGNOSIS — Z01419 Encounter for gynecological examination (general) (routine) without abnormal findings: Secondary | ICD-10-CM

## 2022-06-12 DIAGNOSIS — Z1151 Encounter for screening for human papillomavirus (HPV): Secondary | ICD-10-CM

## 2022-06-12 DIAGNOSIS — I972 Postmastectomy lymphedema syndrome: Secondary | ICD-10-CM

## 2022-06-12 DIAGNOSIS — Z1231 Encounter for screening mammogram for malignant neoplasm of breast: Secondary | ICD-10-CM

## 2022-06-12 DIAGNOSIS — Z124 Encounter for screening for malignant neoplasm of cervix: Secondary | ICD-10-CM

## 2022-06-12 DIAGNOSIS — Z1211 Encounter for screening for malignant neoplasm of colon: Secondary | ICD-10-CM

## 2022-06-12 LAB — LIPID PANEL
Chol/HDL Ratio: 4.6 ratio — ABNORMAL HIGH (ref 0.0–4.4)
Cholesterol, Total: 212 mg/dL — ABNORMAL HIGH (ref 100–199)
HDL: 46 mg/dL (ref >39)
LDL Chol Calc (NIH): 138 mg/dL — ABNORMAL HIGH (ref 0–99)
Triglycerides: 155 mg/dL — ABNORMAL HIGH (ref 0–149)
VLDL Cholesterol Cal: 28 mg/dL (ref 5–40)

## 2022-06-12 LAB — IRON,TIBC AND FERRITIN PANEL
Ferritin: 122 ng/mL (ref 15–150)
Iron Saturation: 16 % (ref 15–55)
Iron: 58 ug/dL (ref 27–159)
Total Iron Binding Capacity: 353 ug/dL (ref 250–450)
UIBC: 295 ug/dL (ref 131–425)

## 2022-06-12 LAB — CBC WITH DIFFERENTIAL/PLATELET
Basophils Absolute: 0 10*3/uL (ref 0.0–0.2)
Basos: 1 %
EOS (ABSOLUTE): 0.2 10*3/uL (ref 0.0–0.4)
Eos: 3 %
Hematocrit: 46.9 % — ABNORMAL HIGH (ref 34.0–46.6)
Hemoglobin: 15.7 g/dL (ref 11.1–15.9)
Immature Grans (Abs): 0 10*3/uL (ref 0.0–0.1)
Immature Granulocytes: 0 %
Lymphocytes Absolute: 2.2 10*3/uL (ref 0.7–3.1)
Lymphs: 32 %
MCH: 30.3 pg (ref 26.6–33.0)
MCHC: 33.5 g/dL (ref 31.5–35.7)
MCV: 91 fL (ref 79–97)
Monocytes Absolute: 0.6 10*3/uL (ref 0.1–0.9)
Monocytes: 9 %
Neutrophils Absolute: 3.7 10*3/uL (ref 1.4–7.0)
Neutrophils: 55 %
Platelets: 246 10*3/uL (ref 150–450)
RBC: 5.18 x10E6/uL (ref 3.77–5.28)
RDW: 13.1 % (ref 11.7–15.4)
WBC: 6.7 10*3/uL (ref 3.4–10.8)

## 2022-06-12 LAB — COMPREHENSIVE METABOLIC PANEL
ALT: 29 IU/L (ref 0–32)
AST: 26 IU/L (ref 0–40)
Albumin/Globulin Ratio: 1.9 (ref 1.2–2.2)
Albumin: 4.6 g/dL (ref 3.8–4.9)
Alkaline Phosphatase: 134 IU/L — ABNORMAL HIGH (ref 44–121)
BUN/Creatinine Ratio: 18 (ref 9–23)
BUN: 16 mg/dL (ref 6–24)
Bilirubin Total: 0.3 mg/dL (ref 0.0–1.2)
CO2: 21 mmol/L (ref 20–29)
Calcium: 9.5 mg/dL (ref 8.7–10.2)
Chloride: 100 mmol/L (ref 96–106)
Creatinine, Ser: 0.88 mg/dL (ref 0.57–1.00)
Globulin, Total: 2.4 g/dL (ref 1.5–4.5)
Glucose: 112 mg/dL — ABNORMAL HIGH (ref 70–99)
Potassium: 4.4 mmol/L (ref 3.5–5.2)
Sodium: 139 mmol/L (ref 134–144)
Total Protein: 7 g/dL (ref 6.0–8.5)
eGFR: 77 mL/min/{1.73_m2} (ref >59)

## 2022-06-12 LAB — HEMOGLOBIN A1C
Est. average glucose Bld gHb Est-mCnc: 128 mg/dL
Hgb A1c MFr Bld: 6.1 % — ABNORMAL HIGH (ref 4.8–5.6)

## 2022-06-12 LAB — VITAMIN B12: Vitamin B-12: 337 pg/mL (ref 232–1245)

## 2022-06-12 LAB — VITAMIN D 25 HYDROXY (VIT D DEFICIENCY, FRACTURES): Vit D, 25-Hydroxy: 43 ng/mL (ref 30.0–100.0)

## 2022-06-16 NOTE — Progress Notes (Unsigned)
No chief complaint on file.   HPI:      [name] is a 57 y.o. G0P0000 who LMP was No LMP recorded. Patient is postmenopausal., presents today for her annual examination.  Her menses are absent.  She does not have PMB.   She does have some vasomotor sx. She is using cream form of estradiol, testosterone, and progesterone meds again, prescribed by NP in [facility].  Did better with oral but it was getting stuck in her throat so changed this yr. Had absorption issues with cream in past due to lymphedema so having increased menopausal sx now. Hx of breast cancer--aware of risks but wants meds for quality of life. Hx of dominant estrogen levels and  progesterone and testosterone are low.   She is not sexually active. She does not have vaginal dryness.  Last Pap: 05/29/16  Results were: no abnormalities/neg HPV DNA   Last mammogram: 10/24/20 Results were: normal--routine follow-up in 12 months LT Breast only. Had RT mastectomy in past.  There is a FH of breast cancer in her mat 1/2 sister, age 59. She had neg genetic panel testing at [facility]. Pt had neg BRCA testing at [facility] in the past, no panel testing done. There is no FH of ovarian cancer. Pt is adopted. The patient does do self-breast exams.   Colonoscopy: colonoscopy 9/20 at [facility] with abnormalities. Repeat due after 3-5 yrs. DEXA: 1/22 at [facility]; osteopenia in spine and hip  Tobacco use: The patient denies current or previous tobacco use. Alcohol use: none No drug use. Exercise: not active   She does get adequate calcium and Vitamin D in her diet.   Labs with PCP. Hx of pre-DM, borderline lipids.    Past Medical History:  Diagnosis Date   Adrenal disorder ([place])    Anemia 11/2018   Breast cancer ([place])    H/O bone density study 2015   H/O colonoscopy    sch 06/12/15   H/O colonoscopy 11/2018   H/O endoscopy 11/2018   upper   Lymphedema    Lymphedema    Migraine    Pap smear for cervical cancer screening [id number]   Postmenopausal  07/02/2015    Past Surgical History:  Procedure Laterality Date   breast  Left 02/18/2017   Implant removed   BREAST ENHANCEMENT SURGERY Bilateral 2005   [place] Right 02/2011   paraesophageal hernia  2021   PORT A CATH INJECTION ([facility])  2013    Family History  Adopted: Yes  Problem Relation Age of Onset   Breast cancer Sister 66       1/2 sister; Genetic testing neg at [facility]    Social History   Socioeconomic History   Marital status: Divorced    Spouse name: Not on file   Number of children: 0   Years of education: Not on file   Highest education level: Associate degree: occupational, Hotel manager, or vocational program  Occupational History   Occupation: Therapist, sports    Comment: part time   Occupation: disability  Tobacco Use   Smoking status: Never   Smokeless tobacco: Never  Vaping Use   Vaping Use: Never used  Substance and Sexual Activity   Alcohol use: No   Drug use: No   Sexual activity: Not Currently    Birth control/protection: Post-menopausal  Other Topics Concern   Not on file  Social History Narrative   Disability [sec  to [employer]; 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  No smoking or alcohol. Lives at home/with sister.    Social Determinants of Health   Financial Resource Strain: Medium Risk (06/18/2020)   Overall Financial Resource Strain (CARDIA)    Difficulty of Paying Living Expenses: Somewhat hard  Food Insecurity: No Food Insecurity (06/18/2020)   Hunger Vital Sign    Worried About Running Out of Food in the Last Year: Never true    Ran Out of Food in the Last Year: Never true  Transportation Needs: No Transportation Needs (06/18/2020)   PRAPARE - Hydrologist (Medical): No    Lack of Transportation (Non-Medical): No  Physical Activity: Inactive (06/18/2020)   Exercise Vital Sign    Days of Exercise per Week: 0 days    Minutes of Exercise  per Session: 0 min  Stress: Stress Concern Present (06/18/2020)   [place]    Feeling of Stress : Rather much  Social Connections: Socially Isolated (06/18/2020)   Social Connection and Isolation Panel [NHANES]    Frequency of Communication with Friends and Family: More than three times a week    Frequency of Social Gatherings with Friends and Family: Three times a week    Attends Religious Services: Never    Active Member of Clubs or Organizations: No    Attends Archivist Meetings: Never    Marital Status: Divorced  Human resources officer Violence: Not At Risk (06/18/2020)   Humiliation, Afraid, Rape, and Kick questionnaire    Fear of Current or Ex-Partner: No    Emotionally Abused: No    Physically Abused: No    Sexually Abused: No    Current Outpatient Medications on File Prior to Visit  Medication Sig Dispense Refill   butalbital-acetaminophen-caffeine (FIORICET) 50-325-40 MG tablet TAKE 1 TO 2 TABLETS BY MOUTH 2 TIMES DAILY AS NEEDED FOR HEADACHE 60 tablet 1   calcium carbonate 100 mg/ml SUSP Take by mouth.     Calcium-Magnesium-Vitamin D [phone] MG-MG-UNIT TABS Take by mouth. Alternates between 1 tablet once daily and two tablets daily.     Cholecalciferol (VITAMIN D3) 5000 units TABS Take by mouth daily at 6 (six) AM.     cyclobenzaprine (FLEXERIL) 5 MG tablet TAKE 1 TABLET BY MOUTH 3 TIMES A DAY AS NEEDED 90 tablet 1   fluocinonide (LIDEX) 0.05 % external solution Apply 1 application topically 2 (two) times daily. 60 mL 3   fluticasone-salmeterol (WIXELA INHUB) 250-50 MCG/ACT AEPB Inhale 1 puff into the lungs in the morning and at bedtime. 60 each 2   furosemide (LASIX) 20 MG tablet TAKE 2 TABLETS BY MOUTH EVERY DAY 180 tablet 0   LORazepam (ATIVAN) 1 MG tablet TAKE 1/2 TABLET BY MOUTH 2 TIMES DAILY AS NEEDED FOR ANXIETY 30 tablet 1   meloxicam (MOBIC) 15 MG tablet Take 1 tablet (15 mg total) by mouth daily.  30 tablet 3   Multiple Vitamin (MULTIVITAMIN) capsule Take 1 capsule by mouth daily.     nystatin ointment (MYCOSTATIN) Apply 1 application topically 2 (two) times daily. At mastectomy site as needed     oxyCODONE-acetaminophen (PERCOCET/ROXICET) 5-325 MG tablet TAKE 1 TABLET BY MOUTH EVERY 8 HOURS AS NEEDED FOR SEVERE PAIN 15 tablet 0   phentermine 37.5 MG capsule Take 1 capsule (37.5 mg total) by mouth every morning. 90 capsule 0   potassium chloride (KLOR-CON) 10 MEQ tablet TAKE 1  TABLET BY MOUTH DAILY 90 tablet 1   promethazine (PHENERGAN) 12.5 MG tablet Take 1 tablet (12.5 mg total) by mouth every 8 (eight) hours as needed for nausea or vomiting. 30 tablet 0   rizatriptan (MAXALT) 10 MG tablet TAKE 1 TABLET BY MOUTH AT FIRST SIGN OF MIGRAINE SYMPTOMS. IF NO RELIEF, A SECOND TABLET MAY BE TAKEN IN 2 HOURS. LIMIT OF 2 TABLETS PER DAY 30 tablet 2   valACYclovir (VALTREX) 500 MG tablet TAKE 1 TABLET BY MOUTH 2 TIMES DAILY AS NEEDED FOR FEVER BLISTERS 30 tablet 5   VENTOLIN HFA 108 (90 Base) MCG/ACT inhaler INHALE 2 PUFFS BY MOUTH EVERY 4 TO 6 HOURS AS NEEDED 18 g 3   No current facility-administered medications on file prior to visit.      ROS:  Review of Systems  Constitutional:  Negative for fatigue, fever and unexpected weight change.  Respiratory:  Negative for cough, shortness of breath and wheezing.   Cardiovascular:  Negative for chest pain, palpitations and leg swelling.  Gastrointestinal:  Negative for blood in stool, constipation, diarrhea, nausea and vomiting.  Endocrine: Negative for cold intolerance, heat intolerance and polyuria.  Genitourinary:  Negative for dyspareunia, dysuria, flank pain, frequency, genital sores, hematuria, menstrual problem, pelvic pain, urgency, vaginal bleeding, vaginal discharge and vaginal pain.  Musculoskeletal:  Negative for back pain, joint swelling and myalgias.  Skin:  Negative for rash.  Neurological:  Negative for dizziness, syncope,  light-headedness, numbness and headaches.  Hematological:  Negative for adenopathy.  Psychiatric/Behavioral:  Negative for agitation, confusion, sleep disturbance and suicidal ideas. The patient is not nervous/anxious.      Objective: There were no vitals taken for this visit.   Physical Exam Constitutional:      Appearance: She is well-developed.  Genitourinary:     Vulva normal.     Right Labia: No rash, tenderness or lesions.    Left Labia: No tenderness, lesions or rash.    No vaginal discharge, erythema or tenderness.      Right Adnexa: not tender and no mass present.    Left Adnexa: not tender and no mass present.    No cervical friability or polyp.     Uterus is not enlarged or tender.  Breasts:    Right: Absent. No mass or skin change.     Left: No mass, nipple discharge, skin change or tenderness.  Neck:     Thyroid: No thyromegaly.  Cardiovascular:     Rate and Rhythm: Normal rate and regular rhythm.     Heart sounds: Normal heart sounds. No murmur heard. Pulmonary:     Effort: Pulmonary effort is normal.     Breath sounds: Normal breath sounds.  Abdominal:     Palpations: Abdomen is soft.     Tenderness: There is no abdominal tenderness. There is no guarding or rebound.  Musculoskeletal:        General: Normal range of motion.     Cervical back: Normal range of motion.  Lymphadenopathy:     Cervical: No cervical adenopathy.  Neurological:     General: No focal deficit present.     Mental Status: She is alert and oriented to person, place, and time.     Cranial Nerves: No cranial nerve deficit.  Skin:    General: Skin is warm and dry.  Psychiatric:        Mood and Affect: Mood normal.        Behavior: Behavior normal.  Thought Content: Thought content normal.        Judgment: Judgment normal.  Vitals reviewed.     Assessment/Plan: Encounter for annual routine gynecological examination  Encounter for screening mammogram for malignant neoplasm  of breast - Plan: MM 3D SCREEN BREAST UNI LEFT; pt to sched mammo  History of breast cancer--MyRisk update testing offered, pt declines for now. Will f/u if desires.   Vasomotor symptoms due to menopause--pt followed by NP in [facility]. Aware of risks of HRT and breast cancer hx.            GYN counsel breast self exam, mammography screening, adequate intake of calcium and vitamin D, diet and exercise     F/U  No follow-ups on file.  Antonius Hartlage B. Adahlia Stembridge, PA-C 06/16/2022 5:00 PM

## 2022-06-17 ENCOUNTER — Encounter: Payer: Self-pay | Admitting: Obstetrics and Gynecology

## 2022-06-17 ENCOUNTER — Other Ambulatory Visit (HOSPITAL_COMMUNITY)
Admission: RE | Admit: 2022-06-17 | Discharge: 2022-06-17 | Disposition: A | Discharge status: Home / Self Care | Payer: Medicare Other | Source: Ambulatory Visit | Attending: Obstetrics and Gynecology | Admitting: Obstetrics and Gynecology

## 2022-06-17 ENCOUNTER — Ambulatory Visit (INDEPENDENT_AMBULATORY_CARE_PROVIDER_SITE_OTHER): Payer: Medicare Other | Admitting: Obstetrics and Gynecology

## 2022-06-17 VITALS — BP 128/90 | Ht 65.0 in | Wt 219.0 lb

## 2022-06-17 DIAGNOSIS — Z853 Personal history of malignant neoplasm of breast: Secondary | ICD-10-CM

## 2022-06-17 DIAGNOSIS — Z1151 Encounter for screening for human papillomavirus (HPV): Secondary | ICD-10-CM | POA: Diagnosis not present

## 2022-06-17 DIAGNOSIS — Z1231 Encounter for screening mammogram for malignant neoplasm of breast: Secondary | ICD-10-CM

## 2022-06-17 DIAGNOSIS — Z124 Encounter for screening for malignant neoplasm of cervix: Secondary | ICD-10-CM

## 2022-06-17 DIAGNOSIS — Z01419 Encounter for gynecological examination (general) (routine) without abnormal findings: Secondary | ICD-10-CM | POA: Insufficient documentation

## 2022-06-17 DIAGNOSIS — Z1211 Encounter for screening for malignant neoplasm of colon: Secondary | ICD-10-CM

## 2022-06-17 DIAGNOSIS — M8588 Other specified disorders of bone density and structure, other site: Secondary | ICD-10-CM

## 2022-06-17 DIAGNOSIS — Z13820 Encounter for screening for osteoporosis: Secondary | ICD-10-CM

## 2022-06-17 DIAGNOSIS — N951 Menopausal and female climacteric states: Secondary | ICD-10-CM

## 2022-06-17 NOTE — Patient Instructions (Signed)
I value your feedback and you entrusting us with your care. If you get a [place] patient survey, I would appreciate you taking the time to let us know about your experience today. Thank you!  [facility]: [phone]      

## 2022-06-18 ENCOUNTER — Other Ambulatory Visit: Payer: Self-pay | Admitting: Physician Assistant

## 2022-06-18 DIAGNOSIS — I890 Lymphedema, not elsewhere classified: Secondary | ICD-10-CM

## 2022-06-19 LAB — CYTOLOGY - PAP
Adequacy: ABSENT
Comment: NEGATIVE
Diagnosis: NEGATIVE
High risk HPV: NEGATIVE

## 2022-06-20 MED ORDER — FUROSEMIDE 20 MG PO TABS: 40.0000 mg | ORAL_TABLET | Freq: Every day | ORAL | 0 refills | Status: DC

## 2022-06-23 ENCOUNTER — Encounter: Payer: Self-pay | Admitting: Internal Medicine

## 2022-06-23 ENCOUNTER — Telehealth: Payer: Self-pay | Admitting: Internal Medicine

## 2022-06-23 NOTE — Telephone Encounter (Signed)
Patient called stating that she had labs drawn at her pcp. She said that she had everything drawn that was needed here and wanted to know if she still needs the labs before the appt on 4/16. I told her I would check with Dr. Senaida Lange team and get back to her. Please advise.

## 2022-06-25 ENCOUNTER — Other Ambulatory Visit: Payer: Medicare Other

## 2022-06-30 MED FILL — Iron Sucrose Inj 20 MG/ML (Fe Equiv): INTRAVENOUS | Qty: 10 | Status: AC

## 2022-07-01 ENCOUNTER — Other Ambulatory Visit: Payer: Medicare Other

## 2022-07-01 ENCOUNTER — Inpatient Hospital Stay: Payer: Medicare Other | Admitting: Internal Medicine

## 2022-07-01 ENCOUNTER — Inpatient Hospital Stay: Payer: Medicare Other

## 2022-07-04 ENCOUNTER — Other Ambulatory Visit: Payer: Medicare Other

## 2022-07-08 ENCOUNTER — Other Ambulatory Visit: Payer: Self-pay | Admitting: Family Medicine

## 2022-07-08 DIAGNOSIS — G43809 Other migraine, not intractable, without status migrainosus: Secondary | ICD-10-CM

## 2022-07-11 ENCOUNTER — Ambulatory Visit
Admission: RE | Admit: 2022-07-11 | Discharge: 2022-07-11 | Disposition: A | Discharge status: Home / Self Care | Payer: Medicare Other | Source: Ambulatory Visit | Attending: Physician Assistant | Admitting: Physician Assistant

## 2022-07-11 ENCOUNTER — Other Ambulatory Visit: Payer: Self-pay | Admitting: Physician Assistant

## 2022-07-11 ENCOUNTER — Encounter: Payer: Self-pay | Admitting: Internal Medicine

## 2022-07-11 DIAGNOSIS — Z0000 Encounter for general adult medical examination without abnormal findings: Secondary | ICD-10-CM

## 2022-07-11 DIAGNOSIS — R7303 Prediabetes: Secondary | ICD-10-CM

## 2022-07-11 DIAGNOSIS — E669 Obesity, unspecified: Secondary | ICD-10-CM

## 2022-07-11 DIAGNOSIS — Z853 Personal history of malignant neoplasm of breast: Secondary | ICD-10-CM

## 2022-07-11 DIAGNOSIS — D500 Iron deficiency anemia secondary to blood loss (chronic): Secondary | ICD-10-CM

## 2022-07-11 DIAGNOSIS — N6489 Other specified disorders of breast: Secondary | ICD-10-CM | POA: Diagnosis not present

## 2022-07-11 DIAGNOSIS — E66812 Obesity, class 2: Secondary | ICD-10-CM

## 2022-07-11 DIAGNOSIS — E7800 Pure hypercholesterolemia, unspecified: Secondary | ICD-10-CM

## 2022-07-11 DIAGNOSIS — R92322 Mammographic fibroglandular density, left breast: Secondary | ICD-10-CM | POA: Diagnosis not present

## 2022-07-11 DIAGNOSIS — I972 Postmastectomy lymphedema syndrome: Secondary | ICD-10-CM

## 2022-07-11 MED FILL — Iron Sucrose Inj 20 MG/ML (Fe Equiv): INTRAVENOUS | Qty: 10 | Status: AC

## 2022-07-14 ENCOUNTER — Encounter: Payer: Self-pay | Admitting: Internal Medicine

## 2022-07-14 ENCOUNTER — Inpatient Hospital Stay: Payer: Medicare Other | Attending: Internal Medicine | Admitting: Internal Medicine

## 2022-07-14 ENCOUNTER — Inpatient Hospital Stay: Payer: Medicare Other

## 2022-07-14 DIAGNOSIS — Z885 Allergy status to narcotic agent status: Secondary | ICD-10-CM | POA: Insufficient documentation

## 2022-07-14 DIAGNOSIS — Z803 Family history of malignant neoplasm of breast: Secondary | ICD-10-CM | POA: Diagnosis not present

## 2022-07-14 DIAGNOSIS — Z9011 Acquired absence of right breast and nipple: Secondary | ICD-10-CM | POA: Diagnosis not present

## 2022-07-14 DIAGNOSIS — M2550 Pain in unspecified joint: Secondary | ICD-10-CM | POA: Insufficient documentation

## 2022-07-14 DIAGNOSIS — D751 Secondary polycythemia: Secondary | ICD-10-CM | POA: Insufficient documentation

## 2022-07-14 DIAGNOSIS — Z8349 Family history of other endocrine, nutritional and metabolic diseases: Secondary | ICD-10-CM | POA: Diagnosis not present

## 2022-07-14 DIAGNOSIS — Z853 Personal history of malignant neoplasm of breast: Secondary | ICD-10-CM | POA: Insufficient documentation

## 2022-07-14 DIAGNOSIS — G4730 Sleep apnea, unspecified: Secondary | ICD-10-CM | POA: Diagnosis not present

## 2022-07-14 DIAGNOSIS — Z79624 Long term (current) use of inhibitors of nucleotide synthesis: Secondary | ICD-10-CM | POA: Diagnosis not present

## 2022-07-14 DIAGNOSIS — Z882 Allergy status to sulfonamides status: Secondary | ICD-10-CM | POA: Diagnosis not present

## 2022-07-14 DIAGNOSIS — Z887 Allergy status to serum and vaccine status: Secondary | ICD-10-CM | POA: Insufficient documentation

## 2022-07-14 DIAGNOSIS — R110 Nausea: Secondary | ICD-10-CM | POA: Insufficient documentation

## 2022-07-14 DIAGNOSIS — Z79899 Other long term (current) drug therapy: Secondary | ICD-10-CM | POA: Insufficient documentation

## 2022-07-14 DIAGNOSIS — Z9882 Breast implant status: Secondary | ICD-10-CM | POA: Insufficient documentation

## 2022-07-14 DIAGNOSIS — Z9221 Personal history of antineoplastic chemotherapy: Secondary | ICD-10-CM | POA: Insufficient documentation

## 2022-07-14 DIAGNOSIS — M549 Dorsalgia, unspecified: Secondary | ICD-10-CM | POA: Insufficient documentation

## 2022-07-14 DIAGNOSIS — Z8541 Personal history of malignant neoplasm of cervix uteri: Secondary | ICD-10-CM | POA: Insufficient documentation

## 2022-07-14 DIAGNOSIS — D500 Iron deficiency anemia secondary to blood loss (chronic): Secondary | ICD-10-CM | POA: Diagnosis not present

## 2022-07-14 DIAGNOSIS — Z5986 Financial insecurity: Secondary | ICD-10-CM | POA: Diagnosis not present

## 2022-07-14 DIAGNOSIS — Z923 Personal history of irradiation: Secondary | ICD-10-CM | POA: Diagnosis not present

## 2022-07-14 DIAGNOSIS — R5383 Other fatigue: Secondary | ICD-10-CM | POA: Diagnosis not present

## 2022-07-14 DIAGNOSIS — I890 Lymphedema, not elsewhere classified: Secondary | ICD-10-CM | POA: Diagnosis not present

## 2022-07-14 NOTE — Assessment & Plan Note (Addendum)
#   History Severe iron deficient anemia-question blood loss appears chronic MCV- resolved s/p since fundoplication. Today hemoglobin is 15.7- stable.   # Erythrocytosis secondary- ? Diuretics/ sleep apnea-difficulty wearing CPAP [issues Lymphedema]- [JAK-2/exon -12- NEG]-- stable. do not recommend phlebotomy.   # Lymphedema chest wall-sec to breast surgery/? Surgery- on PT; on lasix.  Given the difficulty wearing the CPAP s/p evaluation with Marisue Humble.  stable.   # History of breast cancer RIght -stable on surveillance-mammogram left breast unilateral-August 2023 stable.   # SIBO- ? Bloating [[facility]]- awaiting procedures-   # DISPOSITION: # HOLD IV Iron today # follow up in 12  months; MD; labs- cbc/cmp;iron studies/ferritin; B12 levels; possible venofer-Dr.B  Cc; Dr.Gudena

## 2022-07-14 NOTE — Progress Notes (Signed)
[facility]  Patient Care Team: Alfredia Ferguson, PA-C as PCP - General (Physician Assistant) Olena Leatherwood., MD (Gastroenterology) Pa, [facility], [place] (Obstetrics and Gynecology) Earna Coder, MD as Consulting Physician (Oncology)  CHIEF COMPLAINTS/PURPOSE OF CONSULTATION: Anemia/ breast cancer   HEMATOLOGY HISTORY  # Iron deficiency anemia -AUG 3rd 2020-hemoglobin 7.4 MCV 67; ferritin 3/iron saturation 3% [ EGD/colonoscopy- 2017 [screening;[facility]; Dr.Buccini; [place];capsule-none]. SEP 2020- CT abdomen pelvis-large hiatal hernia.  Feraheme-intolerance; OCT 2020-[facility]- [facility] [scopes/capsule]-  #Hiatal hernia status post surgery April 2021 [facility]; anemia resolved  # July 2021-erythrocytosis hemoglobin 16  #Right breast cancer ER PR positive HER-2 negative; T3N1 at 45 right s/p mastectomy N-1/20 LN- ALND s/p RT; TAC x 6 cycles [Dr.Marcom/Dr.Gudena]  # Lymphedema [Dr.Malone]  Oncology History  History of breast cancer  01/01/2011 Initial Diagnosis   Patient presented with palpable lump in the right breast a dumbbell-shaped tumor was identified spanning 7 cm   02/24/2011 Surgery   Right mastectomy revealed a dumbbell-shaped mass 3.5 cm and 2.5 cm with a total span of 7 cm 1/20 lymph nodes were positive ER/PR positive HER-2 negative   05/07/2011 - 09/04/2011 Chemotherapy   Adjuvant chemotherapy with Taxol, Adriamycin, Cytoxan every 3 weeks 6 cycles at [facility] ([email or web address])   09/19/2011 - 10/29/2011 Radiation Therapy   Adjuvant radiation therapy 30 fractions including right axilla   12/01/2011 - 04/01/2013 Anti-estrogen oral therapy   Adjuvant tamoxifen was started reluctantly, Zoladex was added January 2014 and antiestrogen therapy was discontinued January 2015 (profound lymphedema and anasarca )     HISTORY OF PRESENTING ILLNESS: Ambulating independently.  Alone.  Sabrina Morris 57 y.o.  female with  prior history of above breast cancer; and Iron deficiency anemia; erythrocytosis secondary is here for follow-up.  Pt has chronic fatigue. Has full body lymphedema as well. Pt has minimal appetite with early satiation. Chronic nausea from her GI surgery. Taking phentermine with lasix which helps her to lose more fluid.   Patient has no visible blood in stool. Uses metamucil and metamucil to keep bowels moving. As needed SIBO treatment.   Patient has joint and back pain that live at 6/10 on pain scale   Patient having difficulty with wearing the mask because of facial lymphedema.   Review of Systems  Constitutional:  Positive for malaise/fatigue. Negative for chills, diaphoresis and fever.  HENT:  Negative for nosebleeds and sore throat.   Eyes:  Negative for double vision.  Respiratory:  Negative for cough, hemoptysis, sputum production and wheezing.   Cardiovascular:  Negative for chest pain, palpitations, orthopnea and leg swelling.  Gastrointestinal:  Negative for abdominal pain, blood in stool, constipation, diarrhea, heartburn, melena, nausea and vomiting.  Genitourinary:  Negative for dysuria, frequency and urgency.  Musculoskeletal:  Positive for back pain and joint pain.  Skin: Negative.  Negative for itching and rash.  Neurological:  Negative for dizziness, tingling, focal weakness, weakness and headaches.  Endo/Heme/Allergies:  Does not bruise/bleed easily.  Psychiatric/Behavioral:  Negative for depression. The patient is not nervous/anxious and does not have insomnia.     MEDICAL HISTORY:  Past Medical History:  Diagnosis Date   Adrenal disorder (HCC)    Anemia 11/2018   Breast cancer (HCC)    H/O bone density study 2015   H/O colonoscopy    sch 06/12/15   H/O colonoscopy 11/2018   H/O endoscopy 11/2018   upper   Lymphedema    Lymphedema  Migraine    Pap smear for cervical cancer screening [id number]   Postmenopausal 07/02/2015    SURGICAL HISTORY: Past Surgical  History:  Procedure Laterality Date   breast  Left 02/18/2017   Implant removed   BREAST ENHANCEMENT SURGERY Bilateral 2005   CARDIAC CATHETERIZATION  1994   CARDIAC CATHETERIZATION  1994   MASTECTOMY Right 02/2011   paraesophageal hernia  2021   PORT A CATH INJECTION ([facility] HX)  2013    SOCIAL HISTORY: Social History   Socioeconomic History   Marital status: Divorced    Spouse name: Not on file   Number of children: 0   Years of education: Not on file   Highest education level: Associate degree: occupational, Scientist, product/process development, or vocational program  Occupational History   Occupation: Charity fundraiser    Comment: part time   Occupation: disability  Tobacco Use   Smoking status: Never   Smokeless tobacco: Never  Vaping Use   Vaping Use: Never used  Substance and Sexual Activity   Alcohol use: No   Drug use: No   Sexual activity: Not Currently    Birth control/protection: Post-menopausal  Other Topics Concern   Not on file  Social History Narrative   Disability [sec to Lymphedema]; she used to work as a Conservator, 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  No smoking or alcohol. Lives at home/with sister.    Social Determinants of Health   Financial Resource Strain: Medium Risk (06/18/2020)   Overall Financial Resource Strain (CARDIA)    Difficulty of Paying Living Expenses: Somewhat hard  Food Insecurity: No Food Insecurity (06/18/2020)   Hunger Vital Sign    Worried About Running Out of Food in the Last Year: Never true    Ran Out of Food in the Last Year: Never true  Transportation Needs: No Transportation Needs (06/18/2020)   PRAPARE - Administrator, Civil Service (Medical): No    Lack of Transportation (Non-Medical): No  Physical Activity: Inactive (06/18/2020)   Exercise Vital Sign    Days of Exercise per Week: 0 days    Minutes of Exercise per Session: 0 min  Stress: Stress Concern Present (06/18/2020)   [employer] of Occupational Health - Occupational Stress Questionnaire    Feeling  of Stress : Rather much  Social Connections: Socially Isolated (06/18/2020)   Social Connection and Isolation Panel [NHANES]    Frequency of Communication with Friends and Family: More than three times a week    Frequency of Social Gatherings with Friends and Family: Three times a week    Attends Religious Services: Never    Active Member of Clubs or Organizations: No    Attends Banker Meetings: Never    Marital Status: Divorced  Catering manager Violence: Not At Risk (06/18/2020)   Humiliation, Afraid, Rape, and Kick questionnaire    Fear of Current or Ex-Partner: No    Emotionally Abused: No    Physically Abused: No    Sexually Abused: No    FAMILY HISTORY: Family History  Adopted: Yes  Problem Relation Age of Onset   Breast cancer Sister 74       1/2 sister; Genetic testing neg at [facility]   Thyroid disease Sister     ALLERGIES:  is allergic to sumatriptan, feraheme [ferumoxytol], haemophilus influenzae vaccines, other, sulfa antibiotics, sulfamethoxazole, tamoxifen, zoladex  [goserelin], codeine, hydrocodone-acetaminophen, and topiramate.  MEDICATIONS:  Current Outpatient Medications  Medication Sig Dispense Refill   butalbital-acetaminophen-caffeine (FIORICET) 50-325-40 MG tablet TAKE 1 TO 2  TABLETS BY MOUTH 2 TIMES DAILY AS NEEDED FOR HEADACHE 60 tablet 1   calcium carbonate 100 mg/ml SUSP Take by mouth.     Calcium-Magnesium-Vitamin D [phone] MG-MG-UNIT TABS Take by mouth. Alternates between 1 tablet once daily and two tablets daily.     Cholecalciferol (VITAMIN D3) 5000 units TABS Take by mouth daily at 6 (six) AM.     cyclobenzaprine (FLEXERIL) 5 MG tablet TAKE 1 TABLET BY MOUTH 3 TIMES A DAY AS NEEDED 90 tablet 1   fluocinonide (LIDEX) 0.05 % external solution Apply 1 application topically 2 (two) times daily. 60 mL 3   fluticasone-salmeterol (WIXELA INHUB) 250-50 MCG/ACT AEPB Inhale 1 puff into the lungs in the morning and at bedtime. 60 each 2   furosemide  (LASIX) 20 MG tablet Take 2 tablets (40 mg total) by mouth daily. 180 tablet 0   LORazepam (ATIVAN) 1 MG tablet TAKE 1/2 TABLET BY MOUTH 2 TIMES DAILY AS NEEDED FOR ANXIETY 30 tablet 1   Multiple Vitamin (MULTIVITAMIN) capsule Take 1 capsule by mouth daily.     nystatin ointment (MYCOSTATIN) Apply 1 application topically 2 (two) times daily. At mastectomy site as needed     oxyCODONE-acetaminophen (PERCOCET/ROXICET) 5-325 MG tablet TAKE 1 TABLET BY MOUTH EVERY 8 HOURS AS NEEDED FOR SEVERE PAIN 15 tablet 0   phentermine 37.5 MG capsule Take 1 capsule (37.5 mg total) by mouth every morning. 90 capsule 0   potassium chloride (KLOR-CON) 10 MEQ tablet TAKE 1 TABLET BY MOUTH DAILY 90 tablet 1   promethazine (PHENERGAN) 12.5 MG tablet Take 1 tablet (12.5 mg total) by mouth every 8 (eight) hours as needed for nausea or vomiting. 30 tablet 0   rizatriptan (MAXALT) 10 MG tablet TAKE 1 TABLET BY MOUTH AT FIRST SIGN OF MIGRAINE SYMPTOMS. IF NO RELIEF, A SECOND TABLET MAY BE TAKEN IN 2 HOURS. LIMIT OF 2 TABLETS PER DAY 30 tablet 2   valACYclovir (VALTREX) 500 MG tablet TAKE 1 TABLET BY MOUTH 2 TIMES DAILY AS NEEDED FOR FEVER BLISTERS 30 tablet 5   VENTOLIN HFA 108 (90 Base) MCG/ACT inhaler INHALE 2 PUFFS BY MOUTH EVERY 4 TO 6 HOURS AS NEEDED 18 g 3   No current facility-administered medications for this visit.      PHYSICAL EXAMINATION:   Vitals:   07/14/22 1304  BP: 129/79  Pulse: (!) 119  Resp: 18  Temp: 98.9 F (37.2 C)  SpO2: 98%   Filed Weights   07/14/22 1304 07/14/22 1328  Weight: 226 lb (102.5 kg) 223 lb (101.2 kg)    Physical Exam HENT:     Head: Normocephalic and atraumatic.     Mouth/Throat:     Pharynx: No oropharyngeal exudate.  Eyes:     Pupils: Pupils are equal, round, and reactive to light.  Cardiovascular:     Rate and Rhythm: Normal rate and regular rhythm.  Pulmonary:     Effort: Pulmonary effort is normal. No respiratory distress.     Breath sounds: Normal breath  sounds. No wheezing.  Abdominal:     General: Bowel sounds are normal. There is no distension.     Palpations: Abdomen is soft. There is no mass.     Tenderness: There is no abdominal tenderness. There is no guarding or rebound.  Musculoskeletal:        General: No tenderness. Normal range of motion.     Cervical back: Normal range of motion and neck supple.  Skin:    General: Skin  is warm.  Neurological:     Mental Status: She is alert and oriented to person, place, and time.  Psychiatric:        Mood and Affect: Affect normal.     LABORATORY DATA:  I have reviewed the data as listed Lab Results  Component Value Date   WBC 6.7 06/11/2022   HGB 15.7 06/11/2022   HCT 46.9 (H) 06/11/2022   MCV 91 06/11/2022   PLT 246 06/11/2022   Recent Labs    09/23/21 1516 06/11/22 0925  NA 134* 139  K 3.6 4.4  CL 102 100  CO2 22 21  GLUCOSE 110* 112*  BUN 18 16  CREATININE 0.90 0.88  CALCIUM 9.2 9.5  GFRNONAA >60  --   PROT 7.8 7.0  ALBUMIN 4.6 4.6  AST 22 26  ALT 23 29  ALKPHOS 97 134*  BILITOT 0.6 0.3     MM 3D DIAGNOSTIC MAMMOGRAM UNILATERAL LEFT BREAST  Result Date: 07/11/2022 CLINICAL DATA:  History of RIGHT mastectomy in 2012 status post radiation and chemotherapy. History of bilateral breast implants in 2005, implants removed in 2018. Persistent LEFT axillary swelling/lymphedema. EXAM: DIGITAL DIAGNOSTIC UNILATERAL LEFT MAMMOGRAM WITH TOMOSYNTHESIS; [place] AXILLARY LEFT TECHNIQUE: Left digital diagnostic mammography and breast tomosynthesis was performed.; Targeted ultrasound examination of the left axilla was performed. COMPARISON:  Previous exam(s). ACR Breast Density Category b: There are scattered areas of fibroglandular density. FINDINGS: There are no new dominant masses, suspicious calcifications or secondary signs of malignancy within the LEFT breast. No mass or enlarged lymph nodes are seen within the visualized portion of the LEFT axilla. Targeted ultrasound is  performed, evaluating the LEFT axilla, showing only normal soft tissues throughout. No mass or enlarged lymph nodes. IMPRESSION: No evidence of malignancy within the LEFT breast or LEFT axilla. RECOMMENDATION: Screening mammogram in one year.(Code:SM-B-01Y) I have discussed the findings and recommendations with the patient. If applicable, a reminder letter will be sent to the patient regarding the next appointment. BI-RADS CATEGORY  1: Negative. Electronically Signed   By: Bary Richard M.D.   On: 07/11/2022 11:49  [place] AXILLA LEFT  Result Date: 07/11/2022 CLINICAL DATA:  History of RIGHT mastectomy in 2012 status post radiation and chemotherapy. History of bilateral breast implants in 2005, implants removed in 2018. Persistent LEFT axillary swelling/lymphedema. EXAM: DIGITAL DIAGNOSTIC UNILATERAL LEFT MAMMOGRAM WITH TOMOSYNTHESIS; [place] AXILLARY LEFT TECHNIQUE: Left digital diagnostic mammography and breast tomosynthesis was performed.; Targeted ultrasound examination of the left axilla was performed. COMPARISON:  Previous exam(s). ACR Breast Density Category b: There are scattered areas of fibroglandular density. FINDINGS: There are no new dominant masses, suspicious calcifications or secondary signs of malignancy within the LEFT breast. No mass or enlarged lymph nodes are seen within the visualized portion of the LEFT axilla. Targeted ultrasound is performed, evaluating the LEFT axilla, showing only normal soft tissues throughout. No mass or enlarged lymph nodes. IMPRESSION: No evidence of malignancy within the LEFT breast or LEFT axilla. RECOMMENDATION: Screening mammogram in one year.(Code:SM-B-01Y) I have discussed the findings and recommendations with the patient. If applicable, a reminder letter will be sent to the patient regarding the next appointment. BI-RADS CATEGORY  1: Negative. Electronically Signed   By: Bary Richard M.D.   On: 07/11/2022 11:49   Iron deficiency anemia due to chronic blood loss #  History Severe iron deficient anemia-question blood loss appears chronic MCV- resolved s/p since fundoplication. Today hemoglobin is 15.7- STABLE.   # Erythrocytosis secondary- ? Diuretics/ sleep apnea-difficulty wearing  CPAP [issues Lymphedema]- [JAK-2/exon -12- NEG]-- STABLE; do not recommend phlebotomy.   # Lymphedema chest wall-stable sec to breast surgery/? Surgery- on PT; on lasix.  Given the difficulty wearing the CPAP s/p evaluation with Marisue Humble.  Lost insurance- unable to re-start.   # History of breast cancer RIght -stable on surveillance-mammogram left breast unilateral-August 2022 []   # SIBO- ? Bloating [[facility]]  # DISPOSITION: # HOLD IV Iron today # follow up in 7 months; MD; labs- cbc/cmp;iron studies/ferritin; B12 levels; possible venofer-Dr.B  Cc; Dr.Gudena  All questions were answered. The patient knows to call the clinic with any problems, questions or concerns.    Earna Coder, MD 07/14/2022 1:58 PM

## 2022-07-14 NOTE — Progress Notes (Signed)
Has chronic fatigue. Has full body lymphedema as well. Pt has minimal appetite with early satiation. Chronic nausea from her GI surgery. Taking phentermine with lasix which helps her to lose more fluid. No visible blood in stool. Uses metamucil and metamucil to keep bowels moving. Has joint and back pain that live at 6/10 on pain scale.

## 2022-07-23 ENCOUNTER — Encounter: Payer: Self-pay | Admitting: Internal Medicine

## 2022-07-24 ENCOUNTER — Encounter: Payer: Self-pay | Admitting: Internal Medicine

## 2022-07-24 ENCOUNTER — Other Ambulatory Visit: Payer: Self-pay | Admitting: Physician Assistant

## 2022-07-28 ENCOUNTER — Other Ambulatory Visit: Payer: Self-pay | Admitting: Physician Assistant

## 2022-07-28 DIAGNOSIS — M5030 Other cervical disc degeneration, unspecified cervical region: Secondary | ICD-10-CM

## 2022-07-30 ENCOUNTER — Encounter: Payer: Self-pay | Admitting: Physician Assistant

## 2022-07-30 ENCOUNTER — Other Ambulatory Visit: Payer: Self-pay | Admitting: Physician Assistant

## 2022-07-30 MED ORDER — MELOXICAM 15 MG PO TABS
15.0000 mg | ORAL_TABLET | Freq: Every day | ORAL | 3 refills | Status: DC
Start: 2022-07-30 — End: 2023-08-20

## 2022-08-01 DIAGNOSIS — Z86010 Personal history of colonic polyps: Secondary | ICD-10-CM | POA: Diagnosis not present

## 2022-08-01 DIAGNOSIS — K219 Gastro-esophageal reflux disease without esophagitis: Secondary | ICD-10-CM | POA: Diagnosis not present

## 2022-08-01 DIAGNOSIS — K635 Polyp of colon: Secondary | ICD-10-CM | POA: Diagnosis not present

## 2022-08-01 DIAGNOSIS — D126 Benign neoplasm of colon, unspecified: Secondary | ICD-10-CM | POA: Diagnosis not present

## 2022-08-01 DIAGNOSIS — Z882 Allergy status to sulfonamides status: Secondary | ICD-10-CM | POA: Diagnosis not present

## 2022-08-01 DIAGNOSIS — Z885 Allergy status to narcotic agent status: Secondary | ICD-10-CM | POA: Diagnosis not present

## 2022-08-01 DIAGNOSIS — Z1211 Encounter for screening for malignant neoplasm of colon: Secondary | ICD-10-CM | POA: Diagnosis not present

## 2022-08-01 DIAGNOSIS — D122 Benign neoplasm of ascending colon: Secondary | ICD-10-CM | POA: Diagnosis not present

## 2022-08-01 DIAGNOSIS — K317 Polyp of stomach and duodenum: Secondary | ICD-10-CM | POA: Diagnosis not present

## 2022-08-01 DIAGNOSIS — Z9989 Dependence on other enabling machines and devices: Secondary | ICD-10-CM | POA: Diagnosis not present

## 2022-08-01 DIAGNOSIS — K3189 Other diseases of stomach and duodenum: Secondary | ICD-10-CM | POA: Diagnosis not present

## 2022-08-01 DIAGNOSIS — K5730 Diverticulosis of large intestine without perforation or abscess without bleeding: Secondary | ICD-10-CM | POA: Diagnosis not present

## 2022-08-01 DIAGNOSIS — G4730 Sleep apnea, unspecified: Secondary | ICD-10-CM | POA: Diagnosis not present

## 2022-08-01 DIAGNOSIS — Z6836 Body mass index (BMI) 36.0-36.9, adult: Secondary | ICD-10-CM | POA: Diagnosis not present

## 2022-08-01 DIAGNOSIS — K2289 Other specified disease of esophagus: Secondary | ICD-10-CM | POA: Diagnosis not present

## 2022-08-04 DIAGNOSIS — K080 Exfoliation of teeth due to systemic causes: Secondary | ICD-10-CM | POA: Diagnosis not present

## 2022-08-20 DIAGNOSIS — H524 Presbyopia: Secondary | ICD-10-CM | POA: Diagnosis not present

## 2022-08-27 ENCOUNTER — Other Ambulatory Visit: Payer: Medicare Other

## 2022-08-27 ENCOUNTER — Other Ambulatory Visit: Payer: Self-pay | Admitting: Physician Assistant

## 2022-08-27 DIAGNOSIS — F419 Anxiety disorder, unspecified: Secondary | ICD-10-CM

## 2022-08-28 ENCOUNTER — Encounter: Payer: Self-pay | Admitting: Physician Assistant

## 2022-08-28 ENCOUNTER — Telehealth (INDEPENDENT_AMBULATORY_CARE_PROVIDER_SITE_OTHER): Payer: Medicare Other | Admitting: Family Medicine

## 2022-08-28 VITALS — Temp 99.0°F

## 2022-08-28 DIAGNOSIS — J209 Acute bronchitis, unspecified: Secondary | ICD-10-CM

## 2022-08-28 DIAGNOSIS — J42 Unspecified chronic bronchitis: Secondary | ICD-10-CM | POA: Diagnosis not present

## 2022-08-28 MED ORDER — DOXYCYCLINE HYCLATE 100 MG PO TABS
100.0000 mg | ORAL_TABLET | Freq: Two times a day (BID) | ORAL | 0 refills | Status: AC
Start: 2022-08-28 — End: 2022-09-04

## 2022-08-28 MED ORDER — PREDNISONE 10 MG (21) PO TBPK
ORAL_TABLET | ORAL | 0 refills | Status: AC
Start: 2022-08-28 — End: 2022-09-03

## 2022-08-28 MED ORDER — HYDROCOD POLI-CHLORPHE POLI ER 10-8 MG/5ML PO SUER
5.0000 mL | Freq: Two times a day (BID) | ORAL | 0 refills | Status: DC | PRN
Start: 2022-08-28 — End: 2022-10-15

## 2022-08-28 NOTE — Progress Notes (Signed)
Vivien Rota DeSanto,acting as a Neurosurgeon for [employer], DO.,have documented all relevant documentation on the behalf of [employer], DO,as directed by  [employer], DO while in the presence of Nonie Lochner N Rhena Glace, DO.   MyChart Video Visit    Virtual Visit via Video Note   This format is felt to be most appropriate for this patient at this time. Physical exam was limited by quality of the video and audio technology used for the visit.   Patient location: home Provider location: office  I discussed the limitations of evaluation and management by telemedicine and the availability of in person appointments. The patient expressed understanding and agreed to proceed.  Patient: Sabrina Morris   DOB: 08-Mar-1966   57 y.o. Female  MRN: [id number] Visit Date: 08/28/2022  Today's healthcare provider: Sherlyn Hay, DO   Chief Complaint  Patient presents with   Cough   Nasal Congestion   Subjective    HPI  Patient started having symptoms on 08/24/22 similar to that of a simple cold.  Over the next 1-2 days her symptoms progressed and exacerbated to where she is having severe cough and some shortness of breath.    She has been addressing her symptoms with Mucinex, Ventolin, Advair,  phenylephrine and Claritin since 08/25/2022 but is not really getting any relief.    Patient states that she gets this about twice a year for the past 20 years.  Her exacerbations have been worse since she got chemo.  She sounds as though she needs to use the inhaler more but states she can only do 1 puff every 4 hours because it causes her to have palpitations if she does the 2 puffs.    She endorses wheezing She states her cough is not productive becomes productive after starting the medication she is typically given for her bronchitis lacerations. Also has full-body lymphedema which has previously complicated the course of her illnesses.  Temp 99 - alternating ibuprofen and tylenol  Medications: Outpatient  Medications Prior to Visit  Medication Sig   butalbital-acetaminophen-caffeine (FIORICET) 50-325-40 MG tablet TAKE 1 TO 2 TABLETS BY MOUTH 2 TIMES DAILY AS NEEDED FOR HEADACHE   calcium carbonate 100 mg/ml SUSP Take by mouth.   Calcium-Magnesium-Vitamin D [phone] MG-MG-UNIT TABS Take by mouth. Alternates between 1 tablet once daily and two tablets daily.   Cholecalciferol (VITAMIN D3) 5000 units TABS Take by mouth daily at 6 (six) AM.   cyclobenzaprine (FLEXERIL) 5 MG tablet TAKE 1 TABLET BY MOUTH 3 TIMES A DAY AS NEEDED   fluocinonide (LIDEX) 0.05 % external solution Apply 1 application topically 2 (two) times daily.   fluticasone-salmeterol (WIXELA INHUB) 250-50 MCG/ACT AEPB Inhale 1 puff into the lungs in the morning and at bedtime.   furosemide (LASIX) 20 MG tablet Take 2 tablets (40 mg total) by mouth daily.   LORazepam (ATIVAN) 1 MG tablet TAKE 1/2 TABLET BY MOUTH 2 TIMES DAILY AS NEEDED FOR ANXIETY   meloxicam (MOBIC) 15 MG tablet Take 1 tablet (15 mg total) by mouth daily.   Multiple Vitamin (MULTIVITAMIN) capsule Take 1 capsule by mouth daily.   nystatin ointment (MYCOSTATIN) Apply 1 application topically 2 (two) times daily. At mastectomy site as needed   oxyCODONE-acetaminophen (PERCOCET/ROXICET) 5-325 MG tablet TAKE 1 TABLET BY MOUTH EVERY 8 HOURS AS NEEDED FOR SEVERE PAIN   phentermine 37.5 MG capsule Take 1 capsule (37.5 mg total) by mouth every morning.   potassium chloride (KLOR-CON) 10 MEQ  tablet TAKE 1 TABLET BY MOUTH DAILY   promethazine (PHENERGAN) 12.5 MG tablet Take 1 tablet (12.5 mg total) by mouth every 8 (eight) hours as needed for nausea or vomiting.   rizatriptan (MAXALT) 10 MG tablet TAKE 1 TABLET BY MOUTH AT FIRST SIGN OF MIGRAINE SYMPTOMS. IF NO RELIEF, A SECOND TABLET MAY BE TAKEN IN 2 HOURS. LIMIT OF 2 TABLETS PER DAY   valACYclovir (VALTREX) 500 MG tablet TAKE 1 TABLET BY MOUTH 2 TIMES DAILY AS NEEDED FOR FEVER BLISTERS   VENTOLIN HFA 108 (90 Base) MCG/ACT  inhaler INHALE 2 PUFFS BY MOUTH EVERY 4 TO 6 HOURS AS NEEDED   No facility-administered medications prior to visit.    Review of Systems  Constitutional:  Positive for activity change, chills, diaphoresis, fatigue and fever. Negative for appetite change.  HENT:  Positive for congestion, postnasal drip, rhinorrhea, sneezing, sore throat and voice change. Negative for ear discharge, ear pain, hearing loss, sinus pressure, sinus pain, tinnitus and trouble swallowing.   Eyes:  Negative for photophobia, pain, discharge, redness, itching and visual disturbance.  Respiratory:  Positive for cough, chest tightness, shortness of breath and wheezing.   Cardiovascular:  Positive for leg swelling (chronic unchanged). Negative for chest pain and palpitations.  Gastrointestinal:  Negative for abdominal pain, blood in stool, constipation, diarrhea, nausea and vomiting.  Musculoskeletal:  Positive for myalgias.  Neurological:  Positive for headaches. Negative for dizziness and light-headedness.       Objective    Temp 99 F (37.2 C)      Physical Exam Constitutional:      General: She is not in acute distress.    Appearance: Normal appearance.  HENT:     Head: Normocephalic.  Pulmonary:     Effort: Tachypnea and respiratory distress (very mild; able to catch breath and speak in complete sentences, intermittently interrupted by coughing episodes) present.  Neurological:     Mental Status: She is alert and oriented to person, place, and time. Mental status is at baseline.  Psychiatric:        Mood and Affect: Mood normal.        Behavior: Behavior normal.        Assessment & Plan    1. Chronic bronchitis with acute exacerbation (HCC) Will go ahead and treat patient for an acute exacerbation of her chronic bronchitis as noted below.  Patient to contact clinic if she does not start to get better and/or gets worse. - doxycycline (VIBRA-TABS) 100 MG tablet; Take 1 tablet (100 mg total) by  mouth 2 (two) times daily for 7 days.  Dispense: 14 tablet; Refill: 0 - chlorpheniramine-HYDROcodone (TUSSIONEX) 10-8 MG/5ML; Take 5 mLs by mouth every 12 (twelve) hours as needed for cough.  Dispense: 115 mL; Refill: 0 - predniSONE (STERAPRED UNI-PAK 21 TAB) 10 MG (21) TBPK tablet; Take 6 tablets (60 mg total) by mouth daily for 1 day, THEN 5 tablets (50 mg total) daily for 1 day, THEN 4 tablets (40 mg total) daily for 1 day, THEN 3 tablets (30 mg total) daily for 1 day, THEN 2 tablets (20 mg total) daily for 1 day, THEN 1 tablet (10 mg total) daily for 1 day.  Dispense: 21 tablet; Refill: 0    Return if symptoms worsen or fail to improve.     I discussed the assessment and treatment plan with the patient. The patient was provided an opportunity to ask questions and all were answered. The patient agreed with the plan and demonstrated  an understanding of the instructions.   The patient was advised to call back or seek an in-person evaluation if the symptoms worsen or if the condition fails to improve as anticipated.  I provided 17 minutes of virtual-face-to-face time during this encounter.  The entirety of the information documented in the History of Present Illness, Review of Systems and Physical Exam were personally obtained by me. Portions of this information were initially documented by the CMA, Adline Peals, and reviewed by me for thoroughness and accuracy.   I discussed the assessment and treatment plan with the patient  The patient was provided an opportunity to ask questions and all were answered. The patient agreed with the plan and demonstrated an understanding of the instructions.   The patient was advised to call back or seek an in-person evaluation if the symptoms worsen or if the condition fails to improve as anticipated.   Sherlyn Hay, DO [facility] [phone] (phone) [phone] (fax)  [facility]

## 2022-09-04 ENCOUNTER — Encounter: Payer: Self-pay | Admitting: Internal Medicine

## 2022-10-02 ENCOUNTER — Other Ambulatory Visit: Payer: Self-pay | Admitting: Physician Assistant

## 2022-10-02 DIAGNOSIS — M5030 Other cervical disc degeneration, unspecified cervical region: Secondary | ICD-10-CM

## 2022-10-07 ENCOUNTER — Ambulatory Visit (INDEPENDENT_AMBULATORY_CARE_PROVIDER_SITE_OTHER): Payer: Medicare Other | Admitting: Family Medicine

## 2022-10-07 ENCOUNTER — Encounter: Payer: Self-pay | Admitting: Family Medicine

## 2022-10-07 VITALS — BP 121/83 | HR 108 | Ht 65.0 in | Wt 224.6 lb

## 2022-10-07 DIAGNOSIS — I972 Postmastectomy lymphedema syndrome: Secondary | ICD-10-CM | POA: Diagnosis not present

## 2022-10-07 DIAGNOSIS — M5030 Other cervical disc degeneration, unspecified cervical region: Secondary | ICD-10-CM

## 2022-10-07 DIAGNOSIS — K638219 Small intestinal bacterial overgrowth, unspecified: Secondary | ICD-10-CM | POA: Diagnosis not present

## 2022-10-07 DIAGNOSIS — R198 Other specified symptoms and signs involving the digestive system and abdomen: Secondary | ICD-10-CM | POA: Diagnosis not present

## 2022-10-07 DIAGNOSIS — G43809 Other migraine, not intractable, without status migrainosus: Secondary | ICD-10-CM

## 2022-10-07 MED ORDER — OXYCODONE-ACETAMINOPHEN 5-325 MG PO TABS
1.0000 | ORAL_TABLET | Freq: Three times a day (TID) | ORAL | 0 refills | Status: AC | PRN
Start: 2022-10-07 — End: ?

## 2022-10-07 MED ORDER — RIZATRIPTAN BENZOATE 10 MG PO TABS: ORAL_TABLET | ORAL | 2 refills | Status: DC

## 2022-10-07 NOTE — Progress Notes (Signed)
Established patient visit   Patient: Sabrina Morris   DOB: [birth year]   57 y.o. Female  MRN: [id number] Visit Date: 10/07/2022  Today's healthcare provider: Sherlyn Hay, DO   Chief Complaint  Patient presents with   Medication Refill   Subjective    HPI   Has significant lymphedema in whole body s/p breast cancer treatment  - has to use whole body lymph pump - 1 hour per extremity (total can take up to six hours) Original pump - manufacturer changed it and it has a different sequence which is not pumping as effectively.   - she is waiting for trainer appointment to try and resolve this issue.  - used to drop seven pounds in a cycle; now is dropping 1-2 per cycle  Rizatriptan lasts approx. 2 months because taking fioricet in between  - typically migraines in the mornings because of the lymph pump  - still has associated nausea but is not as frequent now.  - Has mesh from hiatal hernia surgery so better that she avoids throwing up. (Dr. Thomes Lolling)  Phentermine - first started in 2017 when she was first put on disability  - does help but lymph pump is primary issue/concern at this time.  - had gotten down to 198 lb.    Medications: Outpatient Medications Prior to Visit  Medication Sig   butalbital-acetaminophen-caffeine (FIORICET) 50-325-40 MG tablet TAKE 1 TO 2 TABLETS BY MOUTH 2 TIMES DAILY AS NEEDED FOR HEADACHE   calcium carbonate 100 mg/ml SUSP Take by mouth.   Calcium-Magnesium-Vitamin D [phone] MG-MG-UNIT TABS Take by mouth. Alternates between 1 tablet once daily and two tablets daily.   Cholecalciferol (VITAMIN D3) 5000 units TABS Take by mouth daily at 6 (six) AM.   cyclobenzaprine (FLEXERIL) 5 MG tablet TAKE 1 TABLET BY MOUTH 3 TIMES A DAY AS NEEDED   fluocinonide (LIDEX) 0.05 % external solution Apply 1 application topically 2 (two) times daily.   fluticasone-salmeterol (WIXELA INHUB) 250-50 MCG/ACT AEPB Inhale 1 puff into the lungs in the morning and  at bedtime.   furosemide (LASIX) 20 MG tablet Take 2 tablets (40 mg total) by mouth daily.   LORazepam (ATIVAN) 1 MG tablet TAKE 1/2 TABLET BY MOUTH 2 TIMES DAILY AS NEEDED FOR ANXIETY   meloxicam (MOBIC) 15 MG tablet Take 1 tablet (15 mg total) by mouth daily.   Multiple Vitamin (MULTIVITAMIN) capsule Take 1 capsule by mouth daily.   nystatin ointment (MYCOSTATIN) Apply 1 application topically 2 (two) times daily. At mastectomy site as needed   phentermine 37.5 MG capsule Take 1 capsule (37.5 mg total) by mouth every morning.   potassium chloride (KLOR-CON) 10 MEQ tablet TAKE 1 TABLET BY MOUTH DAILY   promethazine (PHENERGAN) 12.5 MG tablet Take 1 tablet (12.5 mg total) by mouth every 8 (eight) hours as needed for nausea or vomiting.   valACYclovir (VALTREX) 500 MG tablet TAKE 1 TABLET BY MOUTH 2 TIMES DAILY AS NEEDED FOR FEVER BLISTERS   VENTOLIN HFA 108 (90 Base) MCG/ACT inhaler INHALE 2 PUFFS BY MOUTH EVERY 4 TO 6 HOURS AS NEEDED   [DISCONTINUED] oxyCODONE-acetaminophen (PERCOCET/ROXICET) 5-325 MG tablet TAKE 1 TABLET BY MOUTH EVERY 8 HOURS AS NEEDED FOR SEVERE PAIN   [DISCONTINUED] rizatriptan (MAXALT) 10 MG tablet TAKE 1 TABLET BY MOUTH AT FIRST SIGN OF MIGRAINE SYMPTOMS. IF NO RELIEF, A SECOND TABLET MAY BE TAKEN IN 2 HOURS. LIMIT OF 2 TABLETS PER DAY   chlorpheniramine-HYDROcodone (TUSSIONEX) 10-8 MG/5ML  Take 5 mLs by mouth every 12 (twelve) hours as needed for cough.   No facility-administered medications prior to visit.    Review of Systems  Constitutional:  Negative for appetite change, chills, fatigue and fever.  Respiratory:  Negative for chest tightness and shortness of breath.   Cardiovascular:  Negative for chest pain and palpitations.  Gastrointestinal:  Negative for abdominal pain, nausea and vomiting.  Neurological:  Positive for headaches. Negative for dizziness and weakness.         Objective    BP 121/83 (BP Location: Left Arm, Patient Position: Sitting, Cuff  Size: Large)   Pulse (!) 108   Ht 5\' 5"  (1.651 m)   Wt 224 lb 9.6 oz (101.9 kg)   SpO2 100%   BMI 37.38 kg/m      Physical Exam Vitals and nursing note reviewed.  Constitutional:      General: She is not in acute distress.    Appearance: Normal appearance.  HENT:     Head: Normocephalic and atraumatic.  Eyes:     General: No scleral icterus.    Conjunctiva/sclera: Conjunctivae normal.  Cardiovascular:     Rate and Rhythm: Normal rate.  Pulmonary:     Effort: Pulmonary effort is normal.  Musculoskeletal:     Cervical back: Neck supple.     Right lower leg: Edema present.     Left lower leg: Edema present.     Comments: Generalized nonpitting edema present  Lymphadenopathy:     Cervical: No cervical adenopathy.  Neurological:     Mental Status: She is alert and oriented to person, place, and time. Mental status is at baseline.  Psychiatric:        Mood and Affect: Mood normal.        Behavior: Behavior normal.      No results found for any visits on 10/07/22.  Assessment & Plan    Post-mastectomy lymphedema syndrome Assessment & Plan: Patient has generalized postmastectomy edema throughout her body, which frequently causes her significant pain.  Patient continues to try to get in touch with the manufacture of her lymphedema pump, as the new pump she was given does not remove anywhere near as much fluid as her previous pump.  Refilled her Percocet as noted below.  Orders: -     oxyCODONE-Acetaminophen; Take 1 tablet by mouth every 8 (eight) hours as needed for severe pain. TAKE 1 TABLET BY MOUTH EVERY 8 HOURS AS NEEDED FOR SEVERE PAIN  Dispense: 20 tablet; Refill: 0  Other migraine without status migrainosus, not intractable Assessment & Plan: Patient has significant history of migraines, which is exacerbated by her need to utilize a lymphedema pump on her neck, face and head.  Refilled her rizatriptan as noted below.  Orders: -     Rizatriptan Benzoate; TAKE 1  TABLET BY MOUTH AT FIRST SIGN OF MIGRAINE SYMPTOMS. IF NO RELIEF, A SECOND TABLET MAY BE TAKEN IN 2 HOURS. LIMIT OF 2 TABLETS PER DAY.  Dispense: 30 tablet; Refill: 2  DDD (degenerative disc disease), cervical Assessment & Plan: Her degenerative disc disease contributes significantly to her pain, particularly as she must spend several hours using her lymphedema pump every single day, including pumps around her neck, head and face.  Orders: -     oxyCODONE-Acetaminophen; Take 1 tablet by mouth every 8 (eight) hours as needed for severe pain. TAKE 1 TABLET BY MOUTH EVERY 8 HOURS AS NEEDED FOR SEVERE PAIN  Dispense: 20 tablet; Refill: 0  Return in about 8 months (around 06/15/2023) for CPE.      I discussed the assessment and treatment plan with the patient  The patient was provided an opportunity to ask questions and all were answered. The patient agreed with the plan and demonstrated an understanding of the instructions.   The patient was advised to call back or seek an in-person evaluation if the symptoms worsen or if the condition fails to improve as anticipated.  Total time was 30 minutes. That includes chart review before the visit, the actual patient visit, and time spent on documentation after the visit.    Sherlyn Hay, DO  [facility] [phone] (phone) [phone] (fax)  [facility]

## 2022-10-09 ENCOUNTER — Telehealth: Payer: Self-pay | Admitting: Family Medicine

## 2022-10-09 NOTE — Telephone Encounter (Signed)
Cover[employer] is requesting prior authorization Key: [id number] Name: [place] Rizatriptan Benzoate 10mg  tablets

## 2022-10-15 ENCOUNTER — Encounter: Payer: Self-pay | Admitting: Family Medicine

## 2022-10-15 NOTE — Assessment & Plan Note (Signed)
Patient has generalized postmastectomy edema throughout her body, which frequently causes her significant pain.  Patient continues to try to get in touch with the manufacture of her lymphedema pump, as the new pump she was given does not remove anywhere near as much fluid as her previous pump.  Refilled her Percocet as noted below.

## 2022-10-15 NOTE — Assessment & Plan Note (Signed)
Patient has significant history of migraines, which is exacerbated by her need to utilize a lymphedema pump on her neck, face and head.  Refilled her rizatriptan as noted below.

## 2022-10-15 NOTE — Assessment & Plan Note (Signed)
Her degenerative disc disease contributes significantly to her pain, particularly as she must spend several hours using her lymphedema pump every single day, including pumps around her neck, head and face.

## 2022-10-16 ENCOUNTER — Ambulatory Visit
Admission: RE | Admit: 2022-10-16 | Discharge: 2022-10-16 | Disposition: A | Discharge status: Home / Self Care | Payer: Medicare Other | Source: Ambulatory Visit | Attending: Obstetrics and Gynecology | Admitting: Obstetrics and Gynecology

## 2022-10-16 ENCOUNTER — Encounter: Payer: Self-pay | Admitting: Obstetrics and Gynecology

## 2022-10-16 DIAGNOSIS — M8588 Other specified disorders of bone density and structure, other site: Secondary | ICD-10-CM | POA: Insufficient documentation

## 2022-10-16 DIAGNOSIS — Z13820 Encounter for screening for osteoporosis: Secondary | ICD-10-CM | POA: Diagnosis not present

## 2022-10-16 DIAGNOSIS — M8589 Other specified disorders of bone density and structure, multiple sites: Secondary | ICD-10-CM | POA: Diagnosis not present

## 2022-10-17 ENCOUNTER — Other Ambulatory Visit: Payer: Self-pay | Admitting: Physician Assistant

## 2022-10-17 DIAGNOSIS — I890 Lymphedema, not elsewhere classified: Secondary | ICD-10-CM

## 2022-10-17 MED ORDER — FUROSEMIDE 20 MG PO TABS
40.0000 mg | ORAL_TABLET | Freq: Every day | ORAL | 1 refills | Status: DC
Start: 2022-10-17 — End: 2023-06-08

## 2022-10-22 NOTE — Telephone Encounter (Signed)
Copied from CRM [id number]. Topic: General - Other >> Oct 22, 2022 12:30 PM Phill Myron wrote: Approval for rizatriptan (MAXALT) 10 MG tablet ... Approval # [id number]

## 2022-10-27 NOTE — Telephone Encounter (Signed)
Outcome Approved on August 7 by [employer] [employer] [facility] 2017 Approved. Authorization Expiration Date: 10/21/2023 Drug Rizatriptan Benzoate 10MG  tablets

## 2022-10-27 NOTE — Telephone Encounter (Signed)
LM letting patient know of approval.

## 2022-11-07 ENCOUNTER — Encounter: Payer: Self-pay | Admitting: Family Medicine

## 2022-11-07 DIAGNOSIS — I972 Postmastectomy lymphedema syndrome: Secondary | ICD-10-CM

## 2022-11-18 DIAGNOSIS — K638219 Small intestinal bacterial overgrowth, unspecified: Secondary | ICD-10-CM | POA: Diagnosis not present

## 2022-11-18 DIAGNOSIS — R198 Other specified symptoms and signs involving the digestive system and abdomen: Secondary | ICD-10-CM | POA: Diagnosis not present

## 2022-11-18 DIAGNOSIS — I890 Lymphedema, not elsewhere classified: Secondary | ICD-10-CM | POA: Diagnosis not present

## 2022-11-27 ENCOUNTER — Other Ambulatory Visit: Payer: Self-pay | Admitting: Family Medicine

## 2022-11-27 DIAGNOSIS — F419 Anxiety disorder, unspecified: Secondary | ICD-10-CM

## 2022-12-09 DIAGNOSIS — K080 Exfoliation of teeth due to systemic causes: Secondary | ICD-10-CM | POA: Diagnosis not present

## 2022-12-10 ENCOUNTER — Ambulatory Visit: Payer: Medicare Other | Attending: Internal Medicine | Admitting: Occupational Therapy

## 2022-12-10 ENCOUNTER — Encounter: Payer: Self-pay | Admitting: Internal Medicine

## 2022-12-10 DIAGNOSIS — I972 Postmastectomy lymphedema syndrome: Secondary | ICD-10-CM | POA: Insufficient documentation

## 2022-12-10 DIAGNOSIS — I890 Lymphedema, not elsewhere classified: Secondary | ICD-10-CM | POA: Insufficient documentation

## 2022-12-11 ENCOUNTER — Encounter: Payer: Self-pay | Admitting: Occupational Therapy

## 2022-12-11 NOTE — Therapy (Signed)
Body Structure / Function / Physical Skills: ADL, Edema, Pain, Mobility, Flexibility       Visit Diagnosis: Lymphedema, not elsewhere classified  Post-mastectomy lymphedema syndrome    Problem List Patient Active Problem List   Diagnosis Date Noted   Post-mastectomy lymphedema syndrome 10/07/2022   DDD (degenerative disc disease), cervical 10/07/2022   Osteopenia of lumbar spine 06/17/2022   Class 2 obesity without serious comorbidity with body mass index (BMI) of 36.0 to 36.9 in adult 06/11/2022   Insomnia due to medical condition 04/11/2021   Small intestinal bacterial overgrowth (SIBO) 04/11/2021   Vasomotor symptoms due to menopause 04/17/2020   Fat deposits 07/15/2019   Iron deficiency anemia due to chronic blood loss 10/26/2018    Adrenal disorder (HCC) 05/05/2017   Hypokalemia 11/30/2014   Hypercholesteremia 10/03/2014   Acquired lymphedema 10/03/2014   Prediabetes 10/03/2014   Abnormal weight gain 10/03/2014   Ventricular pre-excitation with arrhythmia 10/03/2014   GERD (gastroesophageal reflux disease) 09/22/2014   Anxiety 09/11/2014   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, [place], [address] Phone: [phone]   Fax:  [phone]  Name: Sabrina Morris MRN: [id number] Date of Birth: 11-06-1965  Body Structure / Function / Physical Skills: ADL, Edema, Pain, Mobility, Flexibility       Visit Diagnosis: Lymphedema, not elsewhere classified  Post-mastectomy lymphedema syndrome    Problem List Patient Active Problem List   Diagnosis Date Noted   Post-mastectomy lymphedema syndrome 10/07/2022   DDD (degenerative disc disease), cervical 10/07/2022   Osteopenia of lumbar spine 06/17/2022   Class 2 obesity without serious comorbidity with body mass index (BMI) of 36.0 to 36.9 in adult 06/11/2022   Insomnia due to medical condition 04/11/2021   Small intestinal bacterial overgrowth (SIBO) 04/11/2021   Vasomotor symptoms due to menopause 04/17/2020   Fat deposits 07/15/2019   Iron deficiency anemia due to chronic blood loss 10/26/2018    Adrenal disorder (HCC) 05/05/2017   Hypokalemia 11/30/2014   Hypercholesteremia 10/03/2014   Acquired lymphedema 10/03/2014   Prediabetes 10/03/2014   Abnormal weight gain 10/03/2014   Ventricular pre-excitation with arrhythmia 10/03/2014   GERD (gastroesophageal reflux disease) 09/22/2014   Anxiety 09/11/2014   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, [place], [address] Phone: [phone]   Fax:  [phone]  Name: Sabrina Morris MRN: [id number] Date of Birth: 11-06-1965  [facility] [address] [place], [address] Phone: [phone]   Fax:  [phone]  Occupational Therapy Evaluation  Patient Details  Name: Sabrina Morris MRN: [id number] Date of Birth: 28-Dec-1965 No data recorded  Encounter Date: 12/10/2022   OT End of Session - 12/10/22 1706     Visit Number 1    Number of Visits 6    Date for OT Re-Evaluation 02/18/23    OT Start Time 1400    OT Stop Time 1549    OT Time Calculation (min) 109 min    Activity Tolerance Patient tolerated treatment well    Behavior During Therapy [facility] for tasks assessed/performed             Past Medical History:  Diagnosis Date   Adrenal disorder (HCC)    Anemia 11/2018   Breast cancer (HCC)    H/O bone density study 2015   H/O colonoscopy    sch 06/12/15   H/O colonoscopy 11/2018   H/O endoscopy 11/2018   upper   Lymphedema    Lymphedema    Migraine    Pap smear for cervical cancer screening [id number]   Postmenopausal 07/02/2015    Past Surgical History:  Procedure Laterality Date   breast  Left 02/18/2017   Implant removed   BREAST ENHANCEMENT SURGERY Bilateral 2005   CARDIAC CATHETERIZATION  1994   CARDIAC CATHETERIZATION  1994   MASTECTOMY Right 02/2011   paraesophageal hernia  2021   PORT A CATH INJECTION ([facility] HX)  2013    There were no vitals filed for this visit.   Subjective Assessment - 12/11/22 1728     Subjective  I have been using my flexitouch pump 2015 for LE, UE and head and neck - was doing okay to maintain and wear garments- then got new pump and garments in 21 - but they did not work as well - they are thicker and sequence is not the same - and my massage therapist that does MLD on me could see that it was not decongesting me- I was more depending on fluid pills , and combening old and new pump- takes me at least 3 hrs to pump every am -just not function - not have pain in arms , shoulders, legs and I get  migraine - if not decongesting my head and neck -cannot wear my CPAP- now they say there are new pump and garments - but need pump and garments - garments do not just fit with my pump    Pertinent History 10/07/22 PCP : Assessment & Plan:  Patient has generalized postmastectomy edema throughout her body, which frequently causes her significant pain.  Patient continues to try to get in touch with the manufacture of her lymphedema pump, as the new pump she was given does not remove anywhere near as much fluid as her previous pump.  Refilled her Percocet as noted below.   Assessment & Plan:  Patient has significant history of migraines, which is exacerbated by her need to utilize a lymphedema pump on her neck, face and head.  Refilled her rizatriptan as noted below.    Currently in Pain? Yes    Pain Score --   6-10 /10 pain - legs, lower back and arms                LYMPHEDEMA/ONCOLOGY QUESTIONNAIRE - 12/11/22 0001       Right Upper Extremity Lymphedema   15 cm  [facility] [address] [place], [address] Phone: [phone]   Fax:  [phone]  Occupational Therapy Evaluation  Patient Details  Name: Sabrina Morris MRN: [id number] Date of Birth: 28-Dec-1965 No data recorded  Encounter Date: 12/10/2022   OT End of Session - 12/10/22 1706     Visit Number 1    Number of Visits 6    Date for OT Re-Evaluation 02/18/23    OT Start Time 1400    OT Stop Time 1549    OT Time Calculation (min) 109 min    Activity Tolerance Patient tolerated treatment well    Behavior During Therapy [facility] for tasks assessed/performed             Past Medical History:  Diagnosis Date   Adrenal disorder (HCC)    Anemia 11/2018   Breast cancer (HCC)    H/O bone density study 2015   H/O colonoscopy    sch 06/12/15   H/O colonoscopy 11/2018   H/O endoscopy 11/2018   upper   Lymphedema    Lymphedema    Migraine    Pap smear for cervical cancer screening [id number]   Postmenopausal 07/02/2015    Past Surgical History:  Procedure Laterality Date   breast  Left 02/18/2017   Implant removed   BREAST ENHANCEMENT SURGERY Bilateral 2005   CARDIAC CATHETERIZATION  1994   CARDIAC CATHETERIZATION  1994   MASTECTOMY Right 02/2011   paraesophageal hernia  2021   PORT A CATH INJECTION ([facility] HX)  2013    There were no vitals filed for this visit.   Subjective Assessment - 12/11/22 1728     Subjective  I have been using my flexitouch pump 2015 for LE, UE and head and neck - was doing okay to maintain and wear garments- then got new pump and garments in 21 - but they did not work as well - they are thicker and sequence is not the same - and my massage therapist that does MLD on me could see that it was not decongesting me- I was more depending on fluid pills , and combening old and new pump- takes me at least 3 hrs to pump every am -just not function - not have pain in arms , shoulders, legs and I get  migraine - if not decongesting my head and neck -cannot wear my CPAP- now they say there are new pump and garments - but need pump and garments - garments do not just fit with my pump    Pertinent History 10/07/22 PCP : Assessment & Plan:  Patient has generalized postmastectomy edema throughout her body, which frequently causes her significant pain.  Patient continues to try to get in touch with the manufacture of her lymphedema pump, as the new pump she was given does not remove anywhere near as much fluid as her previous pump.  Refilled her Percocet as noted below.   Assessment & Plan:  Patient has significant history of migraines, which is exacerbated by her need to utilize a lymphedema pump on her neck, face and head.  Refilled her rizatriptan as noted below.    Currently in Pain? Yes    Pain Score --   6-10 /10 pain - legs, lower back and arms                LYMPHEDEMA/ONCOLOGY QUESTIONNAIRE - 12/11/22 0001       Right Upper Extremity Lymphedema   15 cm

## 2022-12-12 ENCOUNTER — Other Ambulatory Visit: Payer: Self-pay | Admitting: Family Medicine

## 2022-12-12 DIAGNOSIS — I972 Postmastectomy lymphedema syndrome: Secondary | ICD-10-CM

## 2022-12-12 DIAGNOSIS — G43809 Other migraine, not intractable, without status migrainosus: Secondary | ICD-10-CM

## 2022-12-12 DIAGNOSIS — M5030 Other cervical disc degeneration, unspecified cervical region: Secondary | ICD-10-CM

## 2022-12-12 NOTE — Telephone Encounter (Signed)
Requested medications are due for refill today.  yes  Requested medications are on the active medications list.  yes  Last refill. varied  Future visit scheduled.   yes  Notes to clinic.  Refills not delegated.    Requested Prescriptions  Pending Prescriptions Disp Refills   oxyCODONE-acetaminophen (PERCOCET/ROXICET) 5-325 MG tablet [Pharmacy Med Name: OXYCODONE-APAP 5-325 MG TAB] 20 tablet     Sig: TAKE 1 TABLET BY MOUTH EVERY 8 HOURS AS NEEDED FOR SEVERE PAIN     Not Delegated - Analgesics:  Opioid Agonist Combinations Failed - 12/12/2022  4:02 PM      Failed - This refill cannot be delegated      Failed - Urine Drug Screen completed in last 360 days      Passed - Valid encounter within last 3 months    Recent Outpatient Visits           2 months ago Post-mastectomy lymphedema syndrome   [facility] Pardue, Sarah N, DO   3 months ago Chronic bronchitis with acute exacerbation [facility])   [facility] Pardue, Monico Blitz, DO   6 months ago Annual physical exam   [facility] Alfredia Ferguson, PA-C   8 months ago Reactive airway disease with acute exacerbation, unspecified asthma severity, unspecified whether persistent   [facility] Alfredia Ferguson, PA-C   10 months ago Anxiety   [facility] Alfredia Ferguson, PA-C       Future Appointments             In 6 months Pardue, Monico Blitz, DO  [facility]             butalbital-acetaminophen-caffeine (FIORICET) 50-325-40 MG tablet [Pharmacy Med Name: BUTALBITAL-APAP-CAFFEINE 50-325-40] 60 tablet     Sig: TAKE 1 TO 2 TABLETS BY MOUTH 2 TIMES DAILY AS NEEDED FOR HEADACHE     Not Delegated - Analgesics:  Non-Opioid Analgesic Combinations 2 Failed - 12/12/2022  4:02 PM      Failed - This refill cannot be delegated      Passed - Cr in normal range and within 360 days     Creatinine  Date Value Ref Range Status  10/16/2015 0.9 0.6 - 1.1 mg/dL Final   Creatinine, Ser  Date Value Ref Range Status  06/11/2022 0.88 0.57 - 1.00 mg/dL Final         Passed - eGFR is 10 or above and within 360 days    EGFR (African American)  Date Value Ref Range Status  06/22/2013 >60  Final   GFR calc Af Amer  Date Value Ref Range Status  02/17/2020 93 >59 mL/min/1.73 Final    Comment:    **In accordance with recommendations from the NKF-ASN Task force,**   [facility] is in the process of updating its eGFR calculation to the   2021 CKD-EPI creatinine equation that estimates kidney function   without a race variable.    EGFR (Non-African Amer.)  Date Value Ref Range Status  06/22/2013 >60  Final    Comment:    eGFR values <33mL/min/1.73 m2 may be an indication of chronic kidney disease (CKD). Calculated eGFR is useful in patients with stable renal function. The eGFR calculation will not be reliable in acutely ill patients when serum creatinine is changing rapidly. It is not useful in  patients on dialysis. The eGFR calculation may not be applicable to patients at the  low and high extremes of body sizes, pregnant women, and vegetarians. LABS - This specimen was collected through an   - indwelling catheter or arterial line.  - A minimum of of blood was wasted prior    - to collecting the sample.  Interpret  - results with caution.    GFR, Estimated  Date Value Ref Range Status  09/23/2021 >60 >60 mL/min Final    Comment:    (NOTE) Calculated using the CKD-EPI Creatinine Equation (2021)    eGFR  Date Value Ref Range Status  06/11/2022 77 >59 mL/min/1.73 Final         Passed - Patient is not pregnant      Passed - Valid encounter within last 12 months    Recent Outpatient Visits           2 months ago Post-mastectomy lymphedema syndrome   [facility], Monico Blitz, DO   3 months ago Chronic bronchitis with acute  exacerbation [facility])   [facility] Pardue, Monico Blitz, DO   6 months ago Annual physical exam   [facility] [place] [facility] Alfredia Ferguson, PA-C   8 months ago Reactive airway disease with acute exacerbation, unspecified asthma severity, unspecified whether persistent   [facility] Alfredia Ferguson, PA-C   10 months ago Anxiety   [facility] Alfredia Ferguson, PA-C       Future Appointments             In 6 months Pardue, Monico Blitz, DO [place] [facility]

## 2022-12-15 IMAGING — MG DIGITAL SCREENING UNILAT LEFT W/ TOMO W/ CAD
4 series · 4 of 12 positions shown · non-contrast
Comparison: Previous exam(s).

CLINICAL DATA: Screening.

EXAM:
DIGITAL SCREENING UNILATERAL LEFT MAMMOGRAM WITH CAD AND
TOMOSYNTHESIS
TECHNIQUE: Left screening digital craniocaudal and mediolateral oblique
mammograms were obtained. Left screening digital breast
tomosynthesis was performed. The images were evaluated with
computer-aided detection.

[L MLO synth-2D]
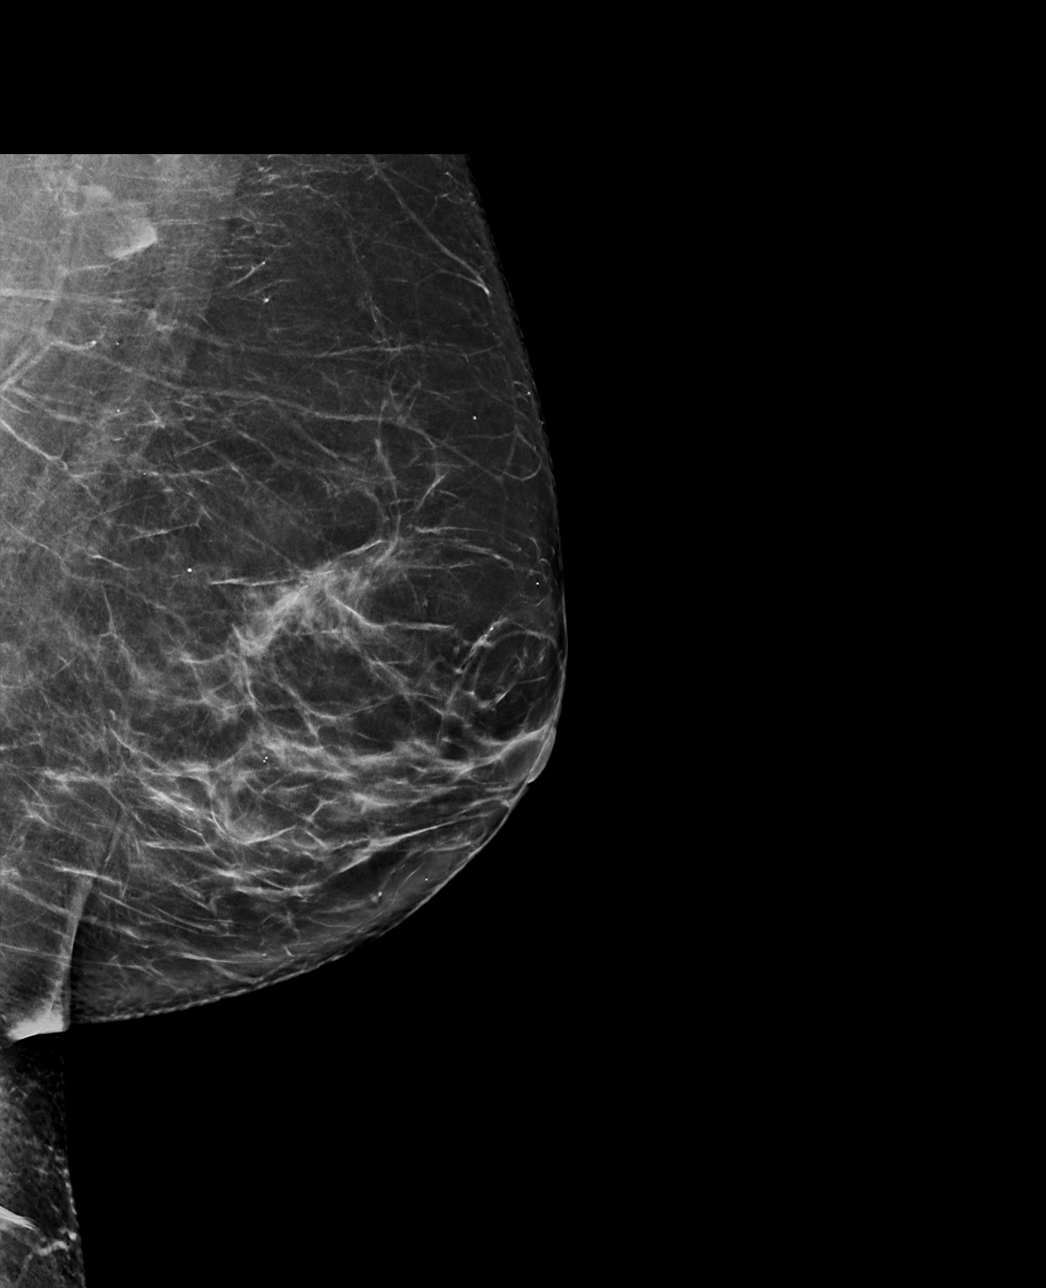

[L CC synth-2D]
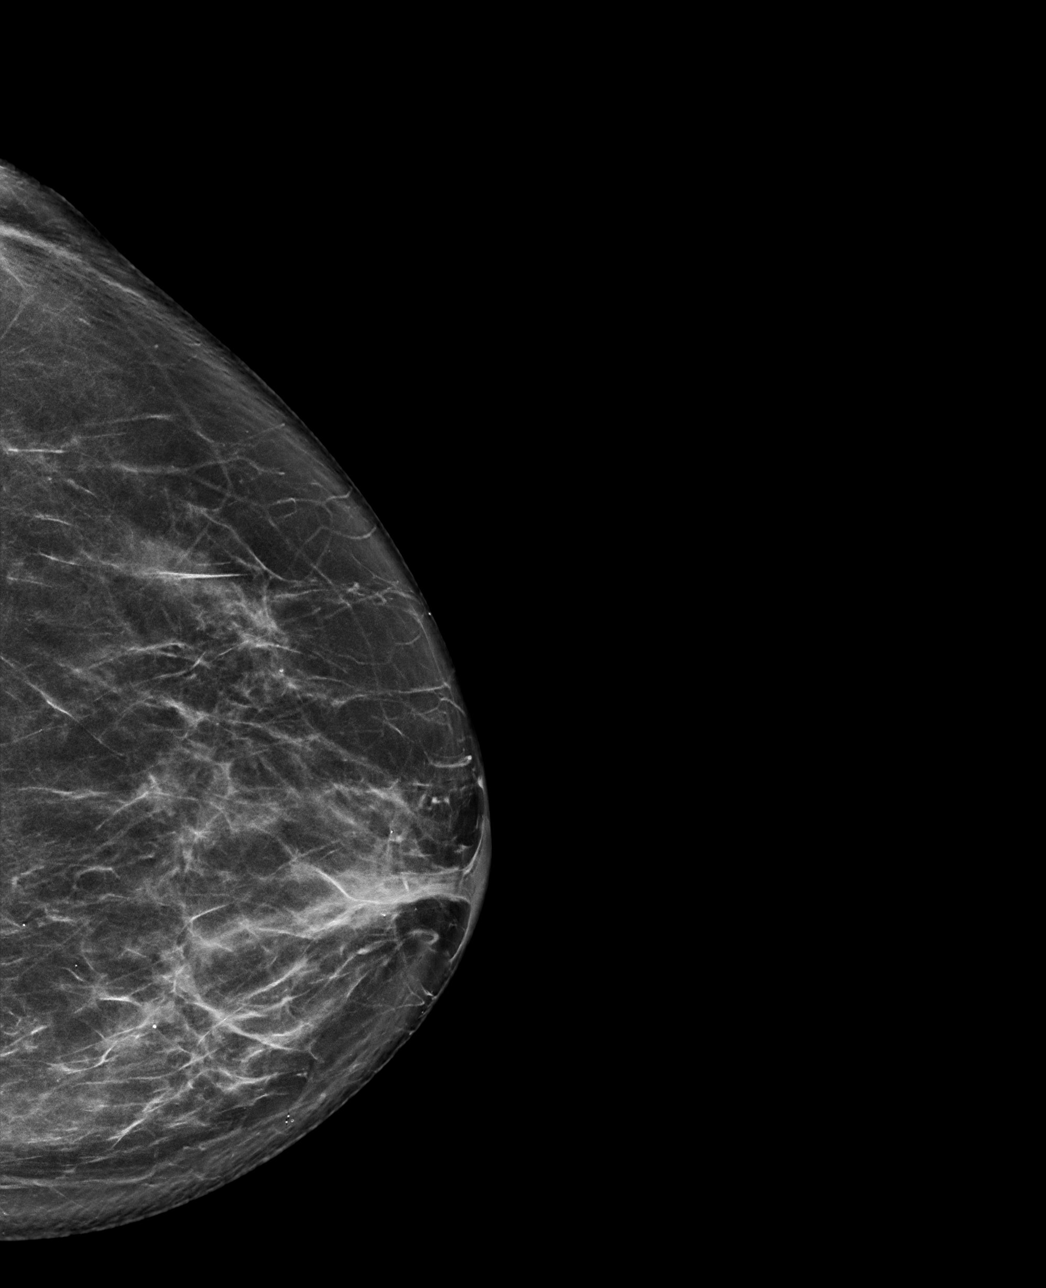

[L MLO tomo · tomo slice 43/84.0]
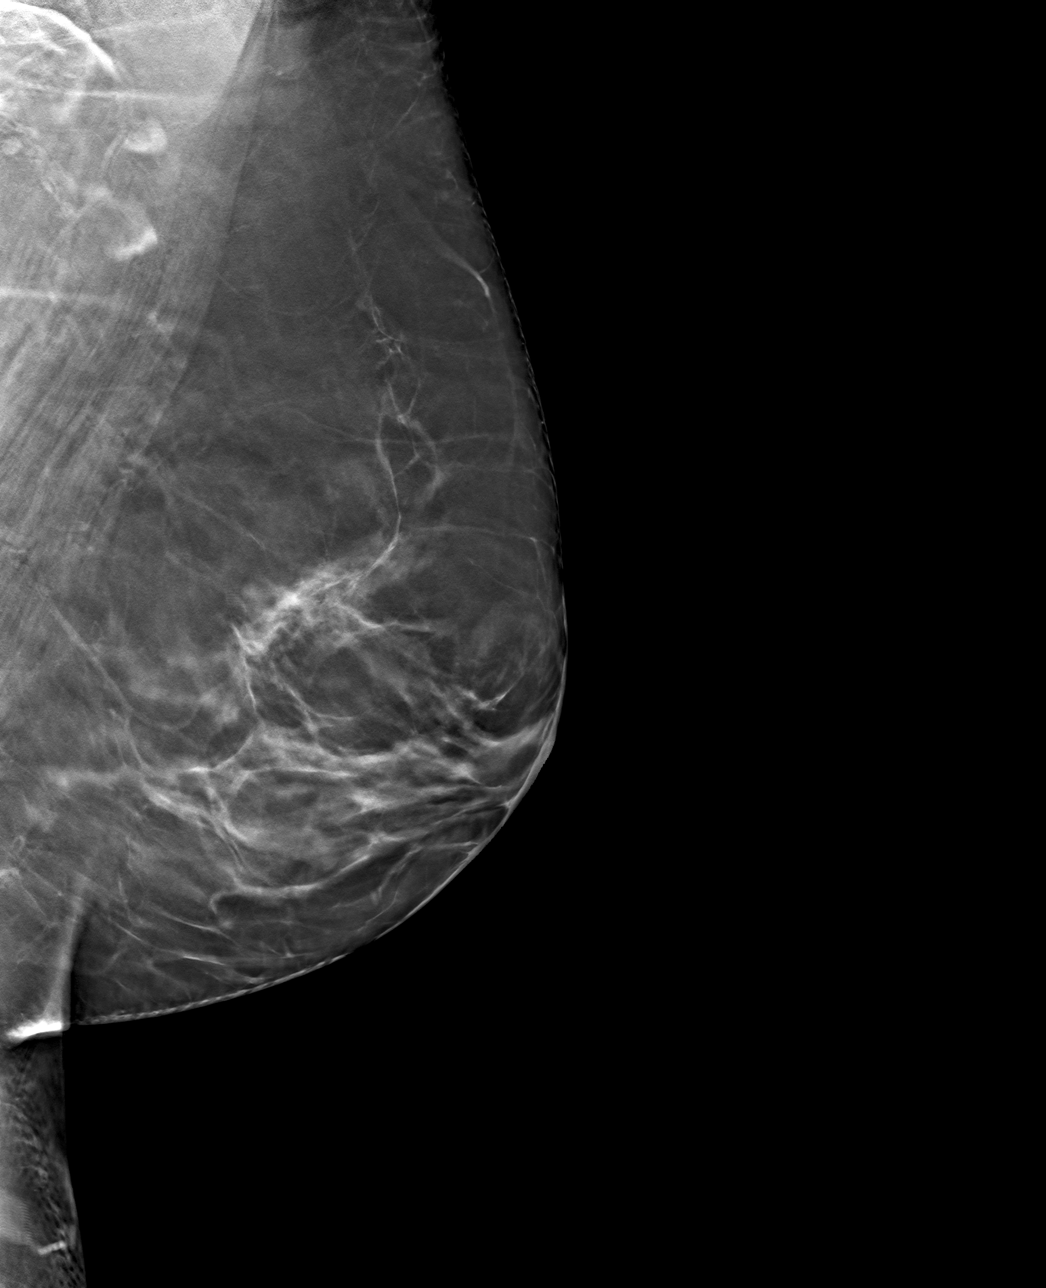

[L CC tomo · tomo slice 39/77.0]
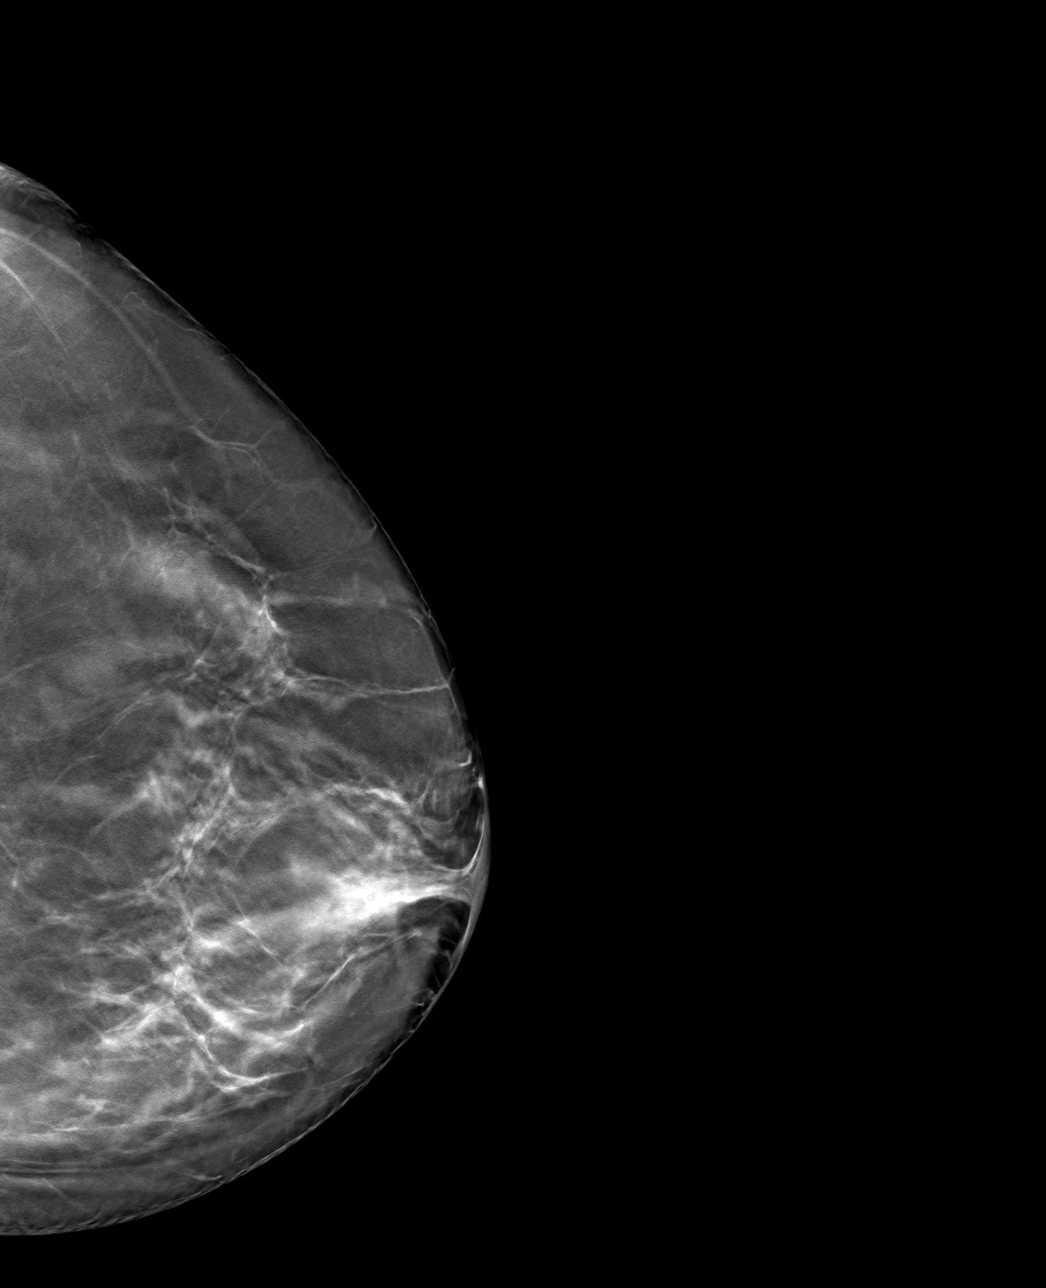

[4 of 12 positions shown; findings below may reference images not displayed]

ACR Breast Density Category b: There are scattered areas of
fibroglandular density.
FINDINGS: The patient has had a right mastectomy. There are no findings
suspicious for malignancy.
IMPRESSION: No mammographic evidence of malignancy. A result letter of this
screening mammogram will be mailed directly to the patient.

RECOMMENDATION:
Screening mammogram in one year.  (Code:QW-[id number])

BI-RADS CATEGORY  1: Negative.

## 2022-12-23 ENCOUNTER — Ambulatory Visit: Payer: Medicare Other | Attending: Internal Medicine | Admitting: Occupational Therapy

## 2022-12-23 DIAGNOSIS — I972 Postmastectomy lymphedema syndrome: Secondary | ICD-10-CM | POA: Insufficient documentation

## 2022-12-23 DIAGNOSIS — I890 Lymphedema, not elsewhere classified: Secondary | ICD-10-CM | POA: Diagnosis not present

## 2022-12-23 NOTE — Therapy (Signed)
performance    Modification or Assistance to Complete Evaluation  Max significant modification of tasks or assist is necessary to complete    OT Frequency --   1 x wk to biweekly   OT Duration --   10 wks   OT Treatment/Interventions Self-care/ADL training;DME and/or AE instruction;Manual Therapy;Compression bandaging;Manual lymph drainage;Patient/family education;Therapeutic exercise    Consulted and Agree with Plan of Care Patient             Patient will benefit from skilled therapeutic intervention in order to improve the following deficits and impairments:   Body Structure / Function / Physical Skills: ADL, Edema, Pain, Mobility, Flexibility       Visit Diagnosis: Lymphedema, not elsewhere classified  Post-mastectomy  lymphedema syndrome    Problem List Patient Active Problem List   Diagnosis Date Noted   Post-mastectomy lymphedema syndrome 10/07/2022   DDD (degenerative disc disease), cervical 10/07/2022   Osteopenia of lumbar spine 06/17/2022   Class 2 obesity without serious comorbidity with body mass index (BMI) of 36.0 to 36.9 in adult 06/11/2022   Insomnia due to medical condition 04/11/2021   Small intestinal bacterial overgrowth (SIBO) 04/11/2021   Vasomotor symptoms due to menopause 04/17/2020   Fat deposits 07/15/2019   Iron deficiency anemia due to chronic blood loss 10/26/2018   Adrenal disorder (HCC) 05/05/2017   Hypokalemia 11/30/2014   Hypercholesteremia 10/03/2014   Acquired lymphedema 10/03/2014   Prediabetes 10/03/2014   Abnormal weight gain 10/03/2014   Ventricular pre-excitation with arrhythmia 10/03/2014   GERD (gastroesophageal reflux disease) 09/22/2014   Anxiety 09/11/2014   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, [place], [address] Phone: [phone]   Fax:  [phone]  Name: Sabrina Morris MRN: [id number] Date of Birth: 11-21-1965  performance    Modification or Assistance to Complete Evaluation  Max significant modification of tasks or assist is necessary to complete    OT Frequency --   1 x wk to biweekly   OT Duration --   10 wks   OT Treatment/Interventions Self-care/ADL training;DME and/or AE instruction;Manual Therapy;Compression bandaging;Manual lymph drainage;Patient/family education;Therapeutic exercise    Consulted and Agree with Plan of Care Patient             Patient will benefit from skilled therapeutic intervention in order to improve the following deficits and impairments:   Body Structure / Function / Physical Skills: ADL, Edema, Pain, Mobility, Flexibility       Visit Diagnosis: Lymphedema, not elsewhere classified  Post-mastectomy  lymphedema syndrome    Problem List Patient Active Problem List   Diagnosis Date Noted   Post-mastectomy lymphedema syndrome 10/07/2022   DDD (degenerative disc disease), cervical 10/07/2022   Osteopenia of lumbar spine 06/17/2022   Class 2 obesity without serious comorbidity with body mass index (BMI) of 36.0 to 36.9 in adult 06/11/2022   Insomnia due to medical condition 04/11/2021   Small intestinal bacterial overgrowth (SIBO) 04/11/2021   Vasomotor symptoms due to menopause 04/17/2020   Fat deposits 07/15/2019   Iron deficiency anemia due to chronic blood loss 10/26/2018   Adrenal disorder (HCC) 05/05/2017   Hypokalemia 11/30/2014   Hypercholesteremia 10/03/2014   Acquired lymphedema 10/03/2014   Prediabetes 10/03/2014   Abnormal weight gain 10/03/2014   Ventricular pre-excitation with arrhythmia 10/03/2014   GERD (gastroesophageal reflux disease) 09/22/2014   Anxiety 09/11/2014   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, [place], [address] Phone: [phone]   Fax:  [phone]  Name: Sabrina Morris MRN: [id number] Date of Birth: 11-21-1965  [facility] [address] [place], [address] Phone: [phone]   Fax:  [phone]  Occupational Therapy Treatment  Patient Details  Name: Sabrina Morris MRN: [id number] Date of Birth: 26-Feb-1966 No data recorded  Encounter Date: 12/23/2022   OT End of Session - 12/23/22 1632     Visit Number 2    Number of Visits 6    Date for OT Re-Evaluation 02/18/23    OT Start Time 1530    OT Stop Time 1632    OT Time Calculation (min) 62 min    Activity Tolerance Patient tolerated treatment well    Behavior During Therapy [facility] for tasks assessed/performed             Past Medical History:  Diagnosis Date   Adrenal disorder (HCC)    Anemia 11/2018   Breast cancer (HCC)    H/O bone density study 2015   H/O colonoscopy    sch 06/12/15   H/O colonoscopy 11/2018   H/O endoscopy 11/2018   upper   Lymphedema    Lymphedema    Migraine    Pap smear for cervical cancer screening [id number]   Postmenopausal 07/02/2015    Past Surgical History:  Procedure Laterality Date   breast  Left 02/18/2017   Implant removed   BREAST ENHANCEMENT SURGERY Bilateral 2005   CARDIAC CATHETERIZATION  1994   CARDIAC CATHETERIZATION  1994   MASTECTOMY Right 02/2011   paraesophageal hernia  2021   PORT A CATH INJECTION ([facility] HX)  2013    There were no vitals filed for this visit.   Subjective Assessment - 12/23/22 1631     Subjective  I used my old pump from 2017 - did head and neck , then arms ,  then legs and rework head and chest again and then trunk - so about 6 1/2 hrs today -    Pertinent History 10/07/22 PCP : Assessment & Plan:  Patient has generalized postmastectomy edema throughout her body, which frequently causes her significant pain.  Patient continues to try to get in touch with the manufacture of her lymphedema pump, as the new pump she was given does not remove anywhere near as much fluid as her previous pump.  Refilled her  Percocet as noted below.   Assessment & Plan:  Patient has significant history of migraines, which is exacerbated by her need to utilize a lymphedema pump on her neck, face and head.  Refilled her rizatriptan as noted below.                 LYMPHEDEMA/ONCOLOGY QUESTIONNAIRE - 12/23/22 0001       Right Upper Extremity Lymphedema   15 cm Proximal to Olecranon Process 40 cm    Olecranon Process 30.8 cm    15 cm Proximal to Ulnar Styloid Process 28.2 cm    Just Proximal to Ulnar Styloid Process 17.2 cm    Across Hand at [facility] 19.5 cm      Left Upper Extremity Lymphedema   15 cm Proximal to Olecranon Process 34.5 cm    Olecranon Process 28.2 cm    15 cm Proximal to Ulnar Styloid Process 27.4 cm    Just Proximal to Ulnar Styloid Process 17.5 cm    Across Hand at [facility] 19 cm      Right Lower Extremity Lymphedema   20 cm Proximal to Suprapatella 67 cm    At Midpatella/Popliteal Crease  [facility] [address] [place], [address] Phone: [phone]   Fax:  [phone]  Occupational Therapy Treatment  Patient Details  Name: Sabrina Morris MRN: [id number] Date of Birth: 26-Feb-1966 No data recorded  Encounter Date: 12/23/2022   OT End of Session - 12/23/22 1632     Visit Number 2    Number of Visits 6    Date for OT Re-Evaluation 02/18/23    OT Start Time 1530    OT Stop Time 1632    OT Time Calculation (min) 62 min    Activity Tolerance Patient tolerated treatment well    Behavior During Therapy [facility] for tasks assessed/performed             Past Medical History:  Diagnosis Date   Adrenal disorder (HCC)    Anemia 11/2018   Breast cancer (HCC)    H/O bone density study 2015   H/O colonoscopy    sch 06/12/15   H/O colonoscopy 11/2018   H/O endoscopy 11/2018   upper   Lymphedema    Lymphedema    Migraine    Pap smear for cervical cancer screening [id number]   Postmenopausal 07/02/2015    Past Surgical History:  Procedure Laterality Date   breast  Left 02/18/2017   Implant removed   BREAST ENHANCEMENT SURGERY Bilateral 2005   CARDIAC CATHETERIZATION  1994   CARDIAC CATHETERIZATION  1994   MASTECTOMY Right 02/2011   paraesophageal hernia  2021   PORT A CATH INJECTION ([facility] HX)  2013    There were no vitals filed for this visit.   Subjective Assessment - 12/23/22 1631     Subjective  I used my old pump from 2017 - did head and neck , then arms ,  then legs and rework head and chest again and then trunk - so about 6 1/2 hrs today -    Pertinent History 10/07/22 PCP : Assessment & Plan:  Patient has generalized postmastectomy edema throughout her body, which frequently causes her significant pain.  Patient continues to try to get in touch with the manufacture of her lymphedema pump, as the new pump she was given does not remove anywhere near as much fluid as her previous pump.  Refilled her  Percocet as noted below.   Assessment & Plan:  Patient has significant history of migraines, which is exacerbated by her need to utilize a lymphedema pump on her neck, face and head.  Refilled her rizatriptan as noted below.                 LYMPHEDEMA/ONCOLOGY QUESTIONNAIRE - 12/23/22 0001       Right Upper Extremity Lymphedema   15 cm Proximal to Olecranon Process 40 cm    Olecranon Process 30.8 cm    15 cm Proximal to Ulnar Styloid Process 28.2 cm    Just Proximal to Ulnar Styloid Process 17.2 cm    Across Hand at [facility] 19.5 cm      Left Upper Extremity Lymphedema   15 cm Proximal to Olecranon Process 34.5 cm    Olecranon Process 28.2 cm    15 cm Proximal to Ulnar Styloid Process 27.4 cm    Just Proximal to Ulnar Styloid Process 17.5 cm    Across Hand at [facility] 19 cm      Right Lower Extremity Lymphedema   20 cm Proximal to Suprapatella 67 cm    At Midpatella/Popliteal Crease

## 2022-12-29 DIAGNOSIS — L821 Other seborrheic keratosis: Secondary | ICD-10-CM | POA: Diagnosis not present

## 2022-12-29 DIAGNOSIS — D2262 Melanocytic nevi of left upper limb, including shoulder: Secondary | ICD-10-CM | POA: Diagnosis not present

## 2022-12-29 DIAGNOSIS — D2261 Melanocytic nevi of right upper limb, including shoulder: Secondary | ICD-10-CM | POA: Diagnosis not present

## 2022-12-29 DIAGNOSIS — D225 Melanocytic nevi of trunk: Secondary | ICD-10-CM | POA: Diagnosis not present

## 2022-12-30 ENCOUNTER — Ambulatory Visit: Payer: Medicare Other | Admitting: Occupational Therapy

## 2023-01-06 ENCOUNTER — Ambulatory Visit: Payer: Medicare Other | Admitting: Occupational Therapy

## 2023-01-06 DIAGNOSIS — I972 Postmastectomy lymphedema syndrome: Secondary | ICD-10-CM | POA: Diagnosis not present

## 2023-01-06 DIAGNOSIS — I890 Lymphedema, not elsewhere classified: Secondary | ICD-10-CM | POA: Diagnosis not present

## 2023-01-06 NOTE — Therapy (Signed)
[facility] [address] [place], [address] Phone: [phone]   Fax:  [phone]  Occupational Therapy Treatment  Patient Details  Name: Sabrina Morris MRN: [id number] Date of Birth: 1965-06-15 No data recorded  Encounter Date: 01/06/2023   OT End of Session - 01/06/23 1809     Visit Number 3    Number of Visits 6    Date for OT Re-Evaluation 02/18/23    OT Start Time 1540    OT Stop Time 1640    OT Time Calculation (min) 60 min    Activity Tolerance Patient tolerated treatment well    Behavior During Therapy [facility] for tasks assessed/performed             Past Medical History:  Diagnosis Date   Adrenal disorder (HCC)    Anemia 11/2018   Breast cancer (HCC)    H/O bone density study 2015   H/O colonoscopy    sch 06/12/15   H/O colonoscopy 11/2018   H/O endoscopy 11/2018   upper   Lymphedema    Lymphedema    Migraine    Pap smear for cervical cancer screening [id number]   Postmenopausal 07/02/2015    Past Surgical History:  Procedure Laterality Date   breast  Left 02/18/2017   Implant removed   BREAST ENHANCEMENT SURGERY Bilateral 2005   CARDIAC CATHETERIZATION  1994   CARDIAC CATHETERIZATION  1994   MASTECTOMY Right 02/2011   paraesophageal hernia  2021   PORT A CATH INJECTION ([facility] HX)  2013    There were no vitals filed for this visit.   Subjective Assessment - 01/06/23 1807     Subjective  So I did like you told me.  And I started doing exercise classess in the water.  I did 2 times and really felt like my legs was just more swollen afterwards.  They felt heavy especially my thighs.  I went home and used my pump every time.  So I done the newest pump as well as this morning I was able to do both legs and 1 cycle of my head and neck.  I also tried one of the gentle yoga but then I woke up that night with a migraine.    Pertinent History 10/07/22 PCP : Assessment & Plan:  Patient has generalized  postmastectomy edema throughout her body, which frequently causes her significant pain.  Patient continues to try to get in touch with the manufacture of her lymphedema pump, as the new pump she was given does not remove anywhere near as much fluid as her previous pump.  Refilled her Percocet as noted below.   Assessment & Plan:  Patient has significant history of migraines, which is exacerbated by her need to utilize a lymphedema pump on her neck, face and head.  Refilled her rizatriptan as noted below.                Right Upper Extremity Lymphedema     15 cm Proximal to Olecranon Process 39.5    Olecranon Process 31cm     15 cm Proximal to Ulnar Styloid Process 29 cm     Just Proximal to Ulnar Styloid Process 18 cm     Across Hand at [employer] 19.3cm          Left Upper Extremity Lymphedema    15 cm Proximal to Olecranon Process 34.5 cm     Olecranon Process 28.5 cm  pt was reassess after using old pump from 2015 on appear pump moving fluid around. Recommend last visit pt to start exercise program which activates the lymphatic muscles pumps in combination with use of her flexi touch pump - She done water exercises and gentle yoga- Pt measurement in knee to ankle decrease but thights increase - waist decrease and neck - R upper arm decrease but hand to forearm increase- Pt to cont with same HEP and pump-And recommend we check on coverage for bilateral thight high custom compression sleeve - incombination with bikers shorts to decrease edema and leg pain when standing and walking. Will assess if she can benefit from compression arm sleeve for  R UE when lifting, engaging in heavy exercise and air travel in keeping w/ lymphedema precautions. Pt may benefit from skilled OT services to assist pt with setting up new HEP to maintain her lymphedema and prevent future issues with infection.    OT Occupational Profile and History  Problem Focused Assessment - Including review of records relating to presenting problem    Occupational performance deficits (Please refer to evaluation for details): ADL's;Play;Leisure;Rest and Sleep    Body Structure / Function / Physical Skills ADL;Edema;Pain;Mobility;Flexibility    Rehab Potential Fair    Clinical Decision Making Multiple treatment options, significant modification of task necessary    Comorbidities Affecting Occupational Performance: May have comorbidities impacting occupational performance    Modification or Assistance to Complete Evaluation  Max significant modification of tasks or assist is necessary to complete    OT Frequency Biweekly    OT Duration 8 weeks    OT Treatment/Interventions Self-care/ADL training;DME and/or AE instruction;Manual Therapy;Compression bandaging;Manual lymph drainage;Patient/family education;Therapeutic exercise    Consulted and Agree with Plan of Care Patient             Patient will benefit from skilled therapeutic intervention in order to improve the following deficits and impairments:   Body Structure / Function / Physical Skills: ADL, Edema, Pain, Mobility, Flexibility       Visit Diagnosis: Lymphedema, not elsewhere classified  Post-mastectomy lymphedema syndrome    Problem List Patient Active Problem List   Diagnosis Date Noted   Post-mastectomy lymphedema syndrome 10/07/2022   DDD (degenerative disc disease), cervical 10/07/2022   Osteopenia of lumbar spine 06/17/2022   Class 2 obesity without serious comorbidity with body mass index (BMI) of 36.0 to 36.9 in adult 06/11/2022   Insomnia due to medical condition 04/11/2021   Small intestinal bacterial overgrowth (SIBO) 04/11/2021   Vasomotor symptoms due to menopause 04/17/2020   Fat deposits 07/15/2019   Iron deficiency anemia due to chronic blood loss 10/26/2018   Adrenal disorder (HCC) 05/05/2017   Hypokalemia 11/30/2014   Hypercholesteremia 10/03/2014    Acquired lymphedema 10/03/2014   Prediabetes 10/03/2014   Abnormal weight gain 10/03/2014   Ventricular pre-excitation with arrhythmia 10/03/2014   GERD (gastroesophageal reflux disease) 09/22/2014   Anxiety 09/11/2014   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, [place], [address] Phone: [phone]   Fax:  [phone]  Name: JATZIRI BANNAN MRN: [id number] Date of Birth: [birth year]  [facility] [address] [place], [address] Phone: [phone]   Fax:  [phone]  Occupational Therapy Treatment  Patient Details  Name: Sabrina Morris MRN: [id number] Date of Birth: 1965-06-15 No data recorded  Encounter Date: 01/06/2023   OT End of Session - 01/06/23 1809     Visit Number 3    Number of Visits 6    Date for OT Re-Evaluation 02/18/23    OT Start Time 1540    OT Stop Time 1640    OT Time Calculation (min) 60 min    Activity Tolerance Patient tolerated treatment well    Behavior During Therapy [facility] for tasks assessed/performed             Past Medical History:  Diagnosis Date   Adrenal disorder (HCC)    Anemia 11/2018   Breast cancer (HCC)    H/O bone density study 2015   H/O colonoscopy    sch 06/12/15   H/O colonoscopy 11/2018   H/O endoscopy 11/2018   upper   Lymphedema    Lymphedema    Migraine    Pap smear for cervical cancer screening [id number]   Postmenopausal 07/02/2015    Past Surgical History:  Procedure Laterality Date   breast  Left 02/18/2017   Implant removed   BREAST ENHANCEMENT SURGERY Bilateral 2005   CARDIAC CATHETERIZATION  1994   CARDIAC CATHETERIZATION  1994   MASTECTOMY Right 02/2011   paraesophageal hernia  2021   PORT A CATH INJECTION ([facility] HX)  2013    There were no vitals filed for this visit.   Subjective Assessment - 01/06/23 1807     Subjective  So I did like you told me.  And I started doing exercise classess in the water.  I did 2 times and really felt like my legs was just more swollen afterwards.  They felt heavy especially my thighs.  I went home and used my pump every time.  So I done the newest pump as well as this morning I was able to do both legs and 1 cycle of my head and neck.  I also tried one of the gentle yoga but then I woke up that night with a migraine.    Pertinent History 10/07/22 PCP : Assessment & Plan:  Patient has generalized  postmastectomy edema throughout her body, which frequently causes her significant pain.  Patient continues to try to get in touch with the manufacture of her lymphedema pump, as the new pump she was given does not remove anywhere near as much fluid as her previous pump.  Refilled her Percocet as noted below.   Assessment & Plan:  Patient has significant history of migraines, which is exacerbated by her need to utilize a lymphedema pump on her neck, face and head.  Refilled her rizatriptan as noted below.                Right Upper Extremity Lymphedema     15 cm Proximal to Olecranon Process 39.5    Olecranon Process 31cm     15 cm Proximal to Ulnar Styloid Process 29 cm     Just Proximal to Ulnar Styloid Process 18 cm     Across Hand at [employer] 19.3cm          Left Upper Extremity Lymphedema    15 cm Proximal to Olecranon Process 34.5 cm     Olecranon Process 28.5 cm  [facility] [address] [place], [address] Phone: [phone]   Fax:  [phone]  Occupational Therapy Treatment  Patient Details  Name: Sabrina Morris MRN: [id number] Date of Birth: 1965-06-15 No data recorded  Encounter Date: 01/06/2023   OT End of Session - 01/06/23 1809     Visit Number 3    Number of Visits 6    Date for OT Re-Evaluation 02/18/23    OT Start Time 1540    OT Stop Time 1640    OT Time Calculation (min) 60 min    Activity Tolerance Patient tolerated treatment well    Behavior During Therapy [facility] for tasks assessed/performed             Past Medical History:  Diagnosis Date   Adrenal disorder (HCC)    Anemia 11/2018   Breast cancer (HCC)    H/O bone density study 2015   H/O colonoscopy    sch 06/12/15   H/O colonoscopy 11/2018   H/O endoscopy 11/2018   upper   Lymphedema    Lymphedema    Migraine    Pap smear for cervical cancer screening [id number]   Postmenopausal 07/02/2015    Past Surgical History:  Procedure Laterality Date   breast  Left 02/18/2017   Implant removed   BREAST ENHANCEMENT SURGERY Bilateral 2005   CARDIAC CATHETERIZATION  1994   CARDIAC CATHETERIZATION  1994   MASTECTOMY Right 02/2011   paraesophageal hernia  2021   PORT A CATH INJECTION ([facility] HX)  2013    There were no vitals filed for this visit.   Subjective Assessment - 01/06/23 1807     Subjective  So I did like you told me.  And I started doing exercise classess in the water.  I did 2 times and really felt like my legs was just more swollen afterwards.  They felt heavy especially my thighs.  I went home and used my pump every time.  So I done the newest pump as well as this morning I was able to do both legs and 1 cycle of my head and neck.  I also tried one of the gentle yoga but then I woke up that night with a migraine.    Pertinent History 10/07/22 PCP : Assessment & Plan:  Patient has generalized  postmastectomy edema throughout her body, which frequently causes her significant pain.  Patient continues to try to get in touch with the manufacture of her lymphedema pump, as the new pump she was given does not remove anywhere near as much fluid as her previous pump.  Refilled her Percocet as noted below.   Assessment & Plan:  Patient has significant history of migraines, which is exacerbated by her need to utilize a lymphedema pump on her neck, face and head.  Refilled her rizatriptan as noted below.                Right Upper Extremity Lymphedema     15 cm Proximal to Olecranon Process 39.5    Olecranon Process 31cm     15 cm Proximal to Ulnar Styloid Process 29 cm     Just Proximal to Ulnar Styloid Process 18 cm     Across Hand at [employer] 19.3cm          Left Upper Extremity Lymphedema    15 cm Proximal to Olecranon Process 34.5 cm     Olecranon Process 28.5 cm

## 2023-01-19 ENCOUNTER — Ambulatory Visit: Payer: Medicare Other | Admitting: Occupational Therapy

## 2023-01-19 ENCOUNTER — Encounter: Payer: Medicare Other | Admitting: Occupational Therapy

## 2023-01-20 ENCOUNTER — Encounter: Payer: Self-pay | Admitting: Family Medicine

## 2023-01-20 DIAGNOSIS — I972 Postmastectomy lymphedema syndrome: Secondary | ICD-10-CM

## 2023-01-26 ENCOUNTER — Ambulatory Visit: Payer: Medicare Other | Admitting: Occupational Therapy

## 2023-01-27 ENCOUNTER — Other Ambulatory Visit: Payer: Self-pay | Admitting: Family Medicine

## 2023-01-27 DIAGNOSIS — F419 Anxiety disorder, unspecified: Secondary | ICD-10-CM

## 2023-01-28 NOTE — Telephone Encounter (Signed)
Requested medication (s) are due for refill today: yes  Requested medication (s) are on the active medication list: yes  Last refill:  lorazepam 11/27/22 #30/0, cyclobenzaprine 07/24/22 #90/1  Future visit scheduled: yes  Notes to clinic:  Unable to refill per protocol, cannot delegate.    Requested Prescriptions  Pending Prescriptions Disp Refills   LORazepam (ATIVAN) 1 MG tablet [Pharmacy Med Name: LORAZEPAM 1 MG TAB] 30 tablet     Sig: TAKE 1/2 TABLET BY MOUTH 2 TIMES DAILY AS NEEDED FOR ANXIETY     Not Delegated - Psychiatry: Anxiolytics/Hypnotics 2 Failed - 01/27/2023  5:56 PM      Failed - This refill cannot be delegated      Failed - Urine Drug Screen completed in last 360 days      Passed - Patient is not pregnant      Passed - Valid encounter within last 6 months    Recent Outpatient Visits           3 months ago Post-mastectomy lymphedema syndrome   [facility] Pardue, Sarah N, DO   5 months ago Chronic bronchitis with acute exacerbation [facility])   [facility] Pard[facility], Monico Blitz, DO   7 months ago Annual physical exam   [facility] Alfredia Ferguson, PA-C   10 months ago Reactive airway disease with acute exacerbation, unspecified asthma severity, unspecified whether persistent   [facility] Alfredia Ferguson, PA-C   1 year ago Anxiety   [facility] Alfredia Ferguson, PA-C       Future Appointments             In 2 months Gollan, Tollie Pizza, MD [place] [place]   In 4 months Pardue, Monico Blitz, DO [facility]             cyclobenzaprine (FLEXERIL) 5 MG tablet [Pharmacy Med Name: CYCLOBENZAPRINE HCL 5 MG TAB] 90 tablet 1    Sig: TAKE 1 TABLET BY MOUTH 3 TIMES A DAY AS NEEDED     Not Delegated - Analgesics:  Muscle Relaxants Failed - 01/27/2023  5:56 PM      Failed - This refill cannot be  delegated      Passed - Valid encounter within last 6 months    Recent Outpatient Visits           3 months ago Post-mastectomy lymphedema syndrome   [facility] [place], Monico Blitz, DO   5 months ago Chronic bronchitis with acute exacerbation [facility])   [facility] Pardue, Monico Blitz, DO   7 months ago Annual physical exam   [facility] Ferguson, [facility]   10 months ago Reactive airway disease with acute exacerbation, unspecified asthma severity, unspecified whether persistent   [facility] Alfredia Ferguson, PA-C   1 year ago Anxiety   [facility] Alfredia Ferguson, PA-C       Future Appointments             In 2 months Gollan, Tollie Pizza, MD [facility] [place]   In 4 months Pardue, Monico Blitz, DO [facility], [facility]

## 2023-01-30 ENCOUNTER — Other Ambulatory Visit: Payer: Self-pay

## 2023-01-30 DIAGNOSIS — F419 Anxiety disorder, unspecified: Secondary | ICD-10-CM

## 2023-01-30 NOTE — Telephone Encounter (Signed)
Copied from CRM [id number]. Topic: General - Other >> Jan 30, 2023 11:04 AM Franchot Heidelberg wrote: Reason for CRM: Pt called reporting that she is almost completley out of both of the medications that have been requested. Please advise

## 2023-02-02 ENCOUNTER — Encounter: Payer: Self-pay | Admitting: Family Medicine

## 2023-02-02 ENCOUNTER — Other Ambulatory Visit: Payer: Self-pay | Admitting: Family Medicine

## 2023-02-02 DIAGNOSIS — F419 Anxiety disorder, unspecified: Secondary | ICD-10-CM

## 2023-02-02 NOTE — Telephone Encounter (Signed)
Medication Refill -  Most Recent Primary Care Visit:  Provider: Sherlyn Hay  Department: [facility]  Visit Type: OFFICE VISIT  Date: 10/07/2022  Medication: cyclobenzaprine (FLEXERIL) 5 MG tablet [[id number]]  and  LORazepam (ATIVAN) 1 MG tablet [[id number]]  Has the patient contacted their pharmacy? Yes (    Is this the correct pharmacy for this prescription? Yes If no, delete pharmacy and type the correct one.  This is the patient's preferred pharmacy:  [facility] - [place], [place] - [address] [place] [address] Phone: [phone] Fax: [phone]  [facility] [place], [place] - [address] [place] [address] Phone: [phone] Fax: [phone]   Has the prescription been filled recently? Yes  Is the patient out of the medication? Yes  Has the patient been seen for an appointment in the last year OR does the patient have an upcoming appointment? Yes  Can we respond through MyChart? Yes  Agent: Please be advised that Rx refills may take up to 3 business days. We ask that you follow-up with your pharmacy.

## 2023-02-03 NOTE — Telephone Encounter (Signed)
Requested medication (s) are due for refill today:   Provider to review     Requested medication (s) are on the active medication list:   Yes for both  Future visit scheduled:   Yes   Last ordered: Already handled  See request  Non delegated refill duplicate request   Requested Prescriptions  Pending Prescriptions Disp Refills   cyclobenzaprine (FLEXERIL) 5 MG tablet 90 tablet 1    Sig: Take 1 tablet (5 mg total) by mouth 3 (three) times daily as needed.     Not Delegated - Analgesics:  Muscle Relaxants Failed - 02/02/2023 10:09 AM      Failed - This refill cannot be delegated      Passed - Valid encounter within last 6 months    Recent Outpatient Visits           3 months ago Post-mastectomy lymphedema syndrome   [facility] Pardue, Sarah N, DO   5 months ago Chronic bronchitis with acute exacerbation [facility])   [facility] Pardue, Monico Blitz, DO   7 months ago Annual physical exam   [facility] Alfred[facility] Ferguson, PA-C   10 months ago Reactive airway disease with acute exacerbation, unspecified asthma severity, unspecified whether persistent   [facility] Ferguson, PA-C   1 year ago Anxiety   [facility] Alfredia Ferguson, PA-C       Future Appointments             In 2 months Gollan, Tollie Pizza, MD [place] [facility] [place]   In 4 months Pardue, Monico Blitz, DO [place] [facility], [facility]             LORazepam (ATIVAN) 1 MG tablet 30 tablet 0    Sig: TAKE 1/2 TABLET BY MOUTH 2 TIMES DAILY AS NEEDED FOR ANXIETY     Not Delegated - Psychiatry: Anxiolytics/Hypnotics 2 Failed - 02/02/2023 10:09 AM      Failed - This refill cannot be delegated      Failed - Urine Drug Screen completed in last 360 days      Passed - Patient is not pregnant      Passed - Valid encounter within last 6 months    Recent Outpatient  Visits           3 months ago Post-mastectomy lymphedema syndrome   [facility] [place], Monico Blitz, DO   5 months ago Chronic bronchitis with acute exacerbation [facility])   [place] [facility] Pardue, Monico Blitz, DO   7 months ago Annual physical exam   [facility] Ferguson, PA-C   10 months ago Reactive airway disease with acute exacerbation, unspecified asthma severity, unspecified whether persistent   [facility] Alfred[facility] Ferguson, PA-C   1 year ago Anxiety   [facility] Alfredia Ferguson, PA-C       Future Appointments             In 2 months Gollan, Tollie Pizza, MD [facility] [place]   In 4 months Pardue, Monico Blitz, DO [place] [facility], [facility]

## 2023-02-11 ENCOUNTER — Encounter: Payer: Self-pay | Admitting: Family Medicine

## 2023-02-16 ENCOUNTER — Ambulatory Visit: Payer: Medicare Other | Attending: Internal Medicine | Admitting: Occupational Therapy

## 2023-02-16 ENCOUNTER — Other Ambulatory Visit: Payer: Self-pay | Admitting: Family Medicine

## 2023-02-16 DIAGNOSIS — M5030 Other cervical disc degeneration, unspecified cervical region: Secondary | ICD-10-CM

## 2023-02-16 DIAGNOSIS — I972 Postmastectomy lymphedema syndrome: Secondary | ICD-10-CM

## 2023-02-17 ENCOUNTER — Ambulatory Visit: Payer: Self-pay

## 2023-02-17 ENCOUNTER — Other Ambulatory Visit: Payer: Self-pay | Admitting: Physician Assistant

## 2023-02-17 DIAGNOSIS — R110 Nausea: Secondary | ICD-10-CM
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
## 2023-02-19 ENCOUNTER — Encounter: Payer: Self-pay | Admitting: Family Medicine

## 2023-02-19 ENCOUNTER — Ambulatory Visit (INDEPENDENT_AMBULATORY_CARE_PROVIDER_SITE_OTHER): Payer: Medicare Other | Admitting: Family Medicine

## 2023-02-19 VITALS — BP 124/78 | HR 108 | Temp 98.3°F | Wt 231.0 lb

## 2023-02-19 DIAGNOSIS — G8929 Other chronic pain: Secondary | ICD-10-CM | POA: Diagnosis not present

## 2023-02-19 DIAGNOSIS — M5441 Lumbago with sciatica, right side: Secondary | ICD-10-CM | POA: Diagnosis not present
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
## 2023-02-20 ENCOUNTER — Ambulatory Visit
Admission: RE | Admit: 2023-02-20 | Discharge: 2023-02-20 | Disposition: A | Discharge status: Home / Self Care | Payer: Medicare Other | Source: Ambulatory Visit | Attending: Family Medicine | Admitting: Family Medicine

## 2023-02-20 DIAGNOSIS — G8929 Other chronic pain: Secondary | ICD-10-CM | POA: Diagnosis not present

## 2023-02-20 DIAGNOSIS — M545 Low back pain, unspecified: Secondary | ICD-10-CM | POA: Diagnosis not present

## 2023-02-20 DIAGNOSIS — M25551 Pain in right hip: Secondary | ICD-10-CM | POA: Diagnosis not present

## 2023-02-20 LAB — C-REACTIVE PROTEIN: CRP: 2 mg/L (ref 0–10)

## 2023-02-20 NOTE — Progress Notes (Signed)
Low grade inflammation

## 2023-02-23 ENCOUNTER — Ambulatory Visit: Payer: Medicare Other | Admitting: Occupational Therapy

## 2023-02-25 ENCOUNTER — Encounter: Payer: Self-pay | Admitting: Family Medicine

## 2023-02-27 ENCOUNTER — Encounter: Payer: Self-pay | Admitting: Family Medicine

## 2023-03-02 ENCOUNTER — Encounter: Payer: Self-pay | Admitting: Family Medicine

## 2023-03-02 NOTE — Progress Notes (Signed)
Degenerative changes in lumbar sacral region - but not acute findings.

## 2023-03-02 NOTE — Progress Notes (Signed)
No acute finding on Sacrum/coccyx imaging, shows known chronic L5 compression fracture.

## 2023-03-03 ENCOUNTER — Telehealth: Payer: Self-pay | Admitting: Family Medicine

## 2023-03-03 ENCOUNTER — Encounter: Payer: Self-pay | Admitting: Family Medicine

## 2023-03-03 ENCOUNTER — Other Ambulatory Visit: Payer: Self-pay | Admitting: Family Medicine

## 2023-03-03 MED ORDER — PROMETHAZINE HCL 12.5 MG RE SUPP: 12.5000 mg | Freq: Three times a day (TID) | RECTAL | 0 refills | Status: DC | PRN

## 2023-03-03 NOTE — Telephone Encounter (Signed)
Patient called stated the script for promethazine (PHENERGAN) 12.5 MG suppository is not available but they have it in the 25mg  at  [facility] - [place], [place] - [address] Phone: [phone]  Fax: [phone]    Patient is requesting a new script for the 25mg  suppositories

## 2023-03-04 ENCOUNTER — Encounter: Payer: Self-pay | Admitting: Family Medicine

## 2023-03-04 ENCOUNTER — Other Ambulatory Visit: Payer: Self-pay | Admitting: Family Medicine

## 2023-03-04 MED ORDER — PROMETHAZINE HCL 25 MG RE SUPP: 25.0000 mg | Freq: Three times a day (TID) | RECTAL | 0 refills | Status: DC | PRN

## 2023-03-05 ENCOUNTER — Encounter: Payer: Medicare Other | Admitting: Occupational Therapy

## 2023-03-09 ENCOUNTER — Other Ambulatory Visit: Payer: Self-pay | Admitting: Physician Assistant

## 2023-03-21 ENCOUNTER — Other Ambulatory Visit: Payer: Self-pay | Admitting: Family Medicine

## 2023-03-21 DIAGNOSIS — F419 Anxiety disorder, unspecified: Secondary | ICD-10-CM

## 2023-03-21 DIAGNOSIS — G43809 Other migraine, not intractable, without status migrainosus: Secondary | ICD-10-CM

## 2023-03-23 NOTE — Telephone Encounter (Signed)
 Requested medication (s) are due for refill today: yes  Requested medication (s) are on the active medication list: yes  Last refill:  Ativan  01/31/23 #30/0, Fioricet 12/15/22 #60/0  Future visit scheduled: yes  Notes to clinic:  Unable to refill per protocol, cannot delegate.    Requested Prescriptions  Pending Prescriptions Disp Refills   LORazepam  (ATIVAN ) 1 MG tablet [Pharmacy Med Name: LORAZEPAM  1 MG TAB] 30 tablet     Sig: TAKE 1/2 TABLET BY MOUTH 2 TIMES DAILY AS NEEDED FOR ANXIETY     Not Delegated - Psychiatry: Anxiolytics/Hypnotics 2 Failed - 03/23/2023 10:24 AM      Failed - This refill cannot be delegated      Failed - Urine Drug Screen completed in last 360 days      Passed - Patient is not pregnant      Passed - Valid encounter within last 6 months    Recent Outpatient Visits           1 month ago Chronic right-sided low back pain with right-sided sciatica   [place] [place], Curtis LABOR, FNP   5 months ago Post-mastectomy lymphedema syndrome   [place] [facility] [place], Lauraine SAILOR, DO   6 months ago Chronic bronchitis with acute exacerbation [facility])   [place] [facility] Pardue, Lauraine SAILOR, DO   9 months ago Annual physical exam   [place] [facility] Cyndi Shaver, PA-C   1 year ago Reactive airway disease with acute exacerbation, unspecified asthma severity, unspecified whether persistent   [place] [facility] Cyndi Shaver, PA-C       Future Appointments             In 2 weeks Gollan, Evalene PARAS, MD [place] [facility] [place]   In 2 months Pardue, Lauraine SAILOR, DO [facility]             butalbital -acetaminophen -caffeine  (FIORICET) 50-325-40 MG tablet [Pharmacy Med Name: BUTALBITAL -APAP-CAFFEINE  50-325-40] 60 tablet     Sig: TAKE 1 TO 2 TABLETS BY MOUTH 2 TIMES DAILY AS NEEDED FOR HEADACHE     Not Delegated -  Analgesics:  Non-Opioid Analgesic Combinations 2 Failed - 03/23/2023 10:24 AM      Failed - This refill cannot be delegated      Passed - Cr in normal range and within 360 days    Creatinine  Date Value Ref Range Status  10/16/2015 0.9 0.6 - 1.1 mg/dL Final   Creatinine, Ser  Date Value Ref Range Status  06/11/2022 0.88 0.57 - 1.00 mg/dL Final         Passed - eGFR is 10 or above and within 360 days    EGFR (African American)  Date Value Ref Range Status  06/22/2013 >60  Final   GFR calc Af Amer  Date Value Ref Range Status  02/17/2020 93 >59 mL/min/1.73 Final    Comment:    **In accordance with recommendations from the NKF-ASN Task force,**   [facility] is in the process of updating its eGFR calculation to the   2021 CKD-EPI creatinine equation that estimates kidney function   without a race variable.    EGFR (Non-African Amer.)  Date Value Ref Range Status  06/22/2013 >60  Final    Comment:    eGFR values <58mL/min/1.73 m2 may be an indication of chronic kidney disease (CKD). Calculated eGFR is useful in patients with stable renal function. The  eGFR calculation will not be reliable in acutely ill patients when serum creatinine is changing rapidly. It is not useful in  patients on dialysis. The eGFR calculation may not be applicable to patients at the low and high extremes of body sizes, pregnant women, and vegetarians. LABS - This specimen was collected through an   - indwelling catheter or arterial line.  - A minimum of of blood was wasted prior    - to collecting the sample.  Interpret  - results with caution.    GFR, Estimated  Date Value Ref Range Status  09/23/2021 >60 >60 mL/min Final    Comment:    (NOTE) Calculated using the CKD-EPI Creatinine Equation (2021)    eGFR  Date Value Ref Range Status  06/11/2022 77 >59 mL/min/1.73 Final         Passed - Patient is not pregnant      Passed - Valid encounter within last 12 months    Recent Outpatient  Visits           1 month ago Chronic right-sided low back pain with right-sided sciatica   [place] [facility] [place], Curtis LABOR, FNP   5 months ago Post-mastectomy lymphedema syndrome   [facility] [place], Lauraine SAILOR, DO   6 months ago Chronic bronchitis with acute exacerbation [facility])   [place] [facility] Pardue, Lauraine SAILOR, DO   9 months ago Annual physical exam   [facility]di Shaver, PA-C   1 year ago Reactive airway disease with acute exacerbation, unspecified asthma severity, unspecified whether persistent   [facility] Cyndi Shaver, PA-C       Future Appointments             In 2 weeks Gollan, Timothy J, MD [place] [facility] [place]   In 2 months Pardue, Lauraine SAILOR, DO [place] [facility]

## 2023-04-06 ENCOUNTER — Ambulatory Visit: Payer: Medicare Other | Admitting: Cardiovascular Disease

## 2023-04-30 ENCOUNTER — Encounter: Payer: Self-pay | Admitting: Family Medicine

## 2023-05-08 NOTE — Telephone Encounter (Signed)
Referral re faxed to [phone]

## 2023-05-12 ENCOUNTER — Ambulatory Visit: Payer: Self-pay | Admitting: Family Medicine

## 2023-05-12 NOTE — Telephone Encounter (Signed)
 Copied from CRM [id number]. Topic: Clinical - Red Word Triage >> May 12, 2023  8:59 AM [name] wrote: Red Word that prompted transfer to Nurse Triage: The patient has athlete feet on all of her toes that has been red and itchy and over the counter products are not helping. However, the patient is concerned that it may be a yeast infection or be dangerous to her immune system as she has lymphedema.  Chief Complaint: itching Symptoms: itching to bilateral feet, redness Frequency:  x 6 weeks Pertinent Negatives: Patient denies bleeding Disposition: [] ED /[] Urgent Care (no appt availability in office) / [x] Appointment(In office/virtual)/ []  [place] [facility]/ [] Home Care/ [] Refused Recommended Disposition /[] [place] [facility]/ []  Follow-up with PCP Additional Notes: pt has hx of lymphedema.pt has one bump on left foot on toe next to big toe.  Pt c/o itching get worse at night. Has applie OTC cream for athlete's feet with no relief.  Reason for Disposition  Rash has spread beyond the instep and toes  Answer Assessment - Initial Assessment Questions 1. DESCRIPTION: "Describe the itching you are having." "Where is it located?"     Itching on bilateral feet 2. SEVERITY: "How bad is it?"    - MILD: Doesn't interfere with normal activities.   - MODERATE-SEVERE: Interferes with work, school, sleep, or other activities.      Moderate severe 3. SCRATCHING: "Are there any scratch marks? Bleeding?"     Scratching but not bleeding 4. ONSET: "When did the itching begin?"      X 6 weeks 5. CAUSE: "What do you think is causing the itching?"      Pt thinks this may be athlete's feet 6. OTHER SYMPTOMS: "Do you have any other symptoms?"      Redness, bump on left foot toe next to big toe 7. PREGNANCY: "Is there any chance you are pregnant?" "When was your last menstrual period?"     N/a  Answer Assessment - Initial Assessment Questions 1. APPEARANCE of RASH: "What does the rash look like?"       Redness, no rash, one bump on left foot toe next to big toe 2. LOCATION: "Which part of the foot is involved?" "Are both feet involved?"      Bilateral feet - toes mostly affected 3. SIZE: "How large is the infected area?" (Inches or centimeters)      All toes 4. ONSET: "When did the rash start?"     X 6 weeks ago 5. OTHER SYMPTOMS: "Do you have any other symptoms?" (e.g., fever)     no 6. PREGNANCY: "Is there any chance you are pregnant?" "When was your last menstrual period?"     N/a  Protocols used: Itching - Localized-A-AH, Athlete's Foot-A-AH

## 2023-05-13 ENCOUNTER — Ambulatory Visit (INDEPENDENT_AMBULATORY_CARE_PROVIDER_SITE_OTHER): Payer: Medicare Other | Admitting: Physician Assistant

## 2023-05-13 VITALS — BP 136/85 | HR 108 | Ht 65.5 in | Wt 226.6 lb

## 2023-05-13 DIAGNOSIS — L299 Pruritus, unspecified: Secondary | ICD-10-CM | POA: Diagnosis not present

## 2023-05-13 DIAGNOSIS — R21 Rash and other nonspecific skin eruption: Secondary | ICD-10-CM

## 2023-05-13 DIAGNOSIS — M2550 Pain in unspecified joint: Secondary | ICD-10-CM

## 2023-05-13 DIAGNOSIS — R7303 Prediabetes: Secondary | ICD-10-CM

## 2023-05-13 DIAGNOSIS — I972 Postmastectomy lymphedema syndrome: Secondary | ICD-10-CM

## 2023-05-13 MED ORDER — HYDROXYZINE PAMOATE 25 MG PO CAPS: 25.0000 mg | ORAL_CAPSULE | Freq: Three times a day (TID) | ORAL | 0 refills | Status: DC | PRN

## 2023-05-13 MED ORDER — TRIAMCINOLONE ACETONIDE 0.1 % EX CREA: 1.0000 | TOPICAL_CREAM | Freq: Two times a day (BID) | CUTANEOUS | 0 refills | Status: DC

## 2023-05-13 MED ORDER — CETIRIZINE HCL 10 MG PO TABS: 10.0000 mg | ORAL_TABLET | Freq: Every day | ORAL | 11 refills | Status: AC

## 2023-05-13 NOTE — Progress Notes (Signed)
 Established patient visit  Patient: Sabrina Morris   DOB: 08/29/1965   58 y.o. Female  MRN: [id number] Visit Date: 05/13/2023  Today's healthcare provider: Debera Lat, PA-C   Chief Complaint  Patient presents with   Foot Problem    Bilateral itching and redness X 6 weeks. Hx of athletes foot however, the patient is concerned that it may be a yeast infection or be dangerous to her immune system as she has lymphedema. She reports one bump on left foot on toe next to big toe.  Pt c/o itching get worse at night. Has applie OTC cream for athlete's feet with no relief.   Subjective     HPI     Foot Problem    Additional comments: Bilateral itching and redness X 6 weeks. Hx of athletes foot however, the patient is concerned that it may be a yeast infection or be dangerous to her immune system as she has lymphedema. She reports one bump on left foot on toe next to big toe.  Pt c/o itching get worse at night. Has applie OTC cream for athlete's feet with no relief.      Last edited by Acey Lav, CMA on 05/13/2023  4:13 PM.       Discussed the use of AI scribe software for clinical note transcription with the patient, who gave verbal consent to proceed.  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 She has a history of athlete's foot but is concerned that the current symptoms might be due to a yeast infection that could potentially harm her immune system. She also reports a bump on the left foot on the toe next to the big toe. The itching intensifies at night and has not responded to antifungal cream. The patient also mentions a history of psoriasis on the scalp. She has been managing her symptoms with antifungal cream and powder, and hydrocortisone for the itching, but reports no relief. She also  reports joint pain, which she attributes to her lymphedema.           05/13/2023    4:18 PM 02/19/2023    1:24 PM 10/07/2022    3:15 PM  Depression screen PHQ 2/9  Decreased Interest 0 0 0  Down, Depressed, Hopeless 0 0 0  PHQ - 2 Score 0 0 0  Altered sleeping  1   Tired, decreased energy  2   Change in appetite  0   Feeling bad or failure about yourself   0   Trouble concentrating  0   Moving slowly or fidgety/restless  0   Suicidal thoughts  0   PHQ-9 Score  3   Difficult doing work/chores  Not difficult at all       05/13/2023    4:18 PM 02/19/2023    1:24 PM 01/20/2022    1:44 PM  GAD 7 : Generalized Anxiety Score  Nervous, Anxious, on Edge 0 0 1  Control/stop worrying 0 0 0  Worry too much - different things 1 0 1  Trouble relaxing 1 1 3   Restless 0 0 1  Easily annoyed or irritable 0 0 1  Afraid - awful might happen 0 0 0  Total GAD 7 Score 2 1 7   Anxiety Difficulty Not difficult at all Not difficult at all Not difficult at all  Medications: Outpatient Medications Prior to Visit  Medication Sig   butalbital-acetaminophen-caffeine (FIORICET) 50-325-40 MG tablet TAKE 1 TO 2 TABLETS BY MOUTH 2 TIMES DAILY AS NEEDED FOR HEADACHE   calcium carbonate 100 mg/ml SUSP Take by mouth.   Calcium-Magnesium-Vitamin D [phone] MG-MG-UNIT TABS Take by mouth. Alternates between 1 tablet once daily and two tablets daily.   Cholecalciferol (VITAMIN D3) 5000 units TABS Take by mouth daily at 6 (six) AM.   cyclobenzaprine (FLEXERIL) 5 MG tablet TAKE 1 TABLET BY MOUTH 3 TIMES A DAY AS NEEDED   fluocinonide (LIDEX) 0.05 % external solution Apply 1 application topically 2 (two) times daily.   fluticasone-salmeterol (WIXELA INHUB) 250-50 MCG/ACT AEPB Inhale 1 puff into the lungs in the morning and at bedtime.   furosemide (LASIX) 20 MG tablet Take 2 tablets (40 mg total) by mouth daily.   LORazepam (ATIVAN) 1 MG tablet TAKE 1/2 TABLET BY MOUTH 2 TIMES DAILY AS NEEDED FOR ANXIETY    meloxicam (MOBIC) 15 MG tablet Take 1 tablet (15 mg total) by mouth daily.   Multiple Vitamin (MULTIVITAMIN) capsule Take 1 capsule by mouth daily.   nystatin ointment (MYCOSTATIN) Apply 1 application topically 2 (two) times daily. At mastectomy site as needed   oxyCODONE-acetaminophen (PERCOCET/ROXICET) 5-325 MG tablet Take 1 tablet by mouth every 8 (eight) hours as needed for severe pain (pain score 7-10).   phentermine 37.5 MG capsule Take 1 capsule (37.5 mg total) by mouth every morning.   potassium chloride (KLOR-CON) 10 MEQ tablet TAKE 1 TABLET BY MOUTH DAILY   promethazine (PHENERGAN) 12.5 MG tablet TAKE 1 TABLET BY MOUTH EVERY 8 HOURS AS NEEDED FOR NAUSEA OR VOMITING   promethazine (PHENERGAN) 25 MG suppository Place 1 suppository (25 mg total) rectally every 8 (eight) hours as needed for nausea or vomiting.   rizatriptan (MAXALT) 10 MG tablet TAKE 1 TABLET BY MOUTH AT FIRST SIGN OF MIGRAINE SYMPTOMS. IF NO RELIEF, A SECOND TABLET MAY BE TAKEN IN 2 HOURS. LIMIT OF 2 TABLETS PER DAY.   valACYclovir (VALTREX) 500 MG tablet TAKE 1 TABLET BY MOUTH 2 TIMES DAILY AS NEEDED FOR FEVER BLISTERS   VENTOLIN HFA 108 (90 Base) MCG/ACT inhaler INHALE 2 PUFFS BY MOUTH EVERY 4 TO 6 HOURS AS NEEDED   No facility-administered medications prior to visit.    Review of Systems  All other systems reviewed and are negative.  All negative Except see HPI       Objective    BP 136/85 (BP Location: Left Arm, Patient Position: Sitting, Cuff Size: Large)   Pulse (!) 108   Ht 5' 5.5" (1.664 m)   Wt 226 lb 9.6 oz (102.8 kg)   SpO2 98%   BMI 37.13 kg/m     Physical Exam Constitutional:      General: She is not in acute distress.    Appearance: Normal appearance.  HENT:     Head: Normocephalic.  Pulmonary:     Effort: Pulmonary effort is normal. No respiratory distress.  Neurological:     Mental Status: She is alert and oriented to person, place, and time. Mental status is at baseline.      No  results found for any visits on 05/13/23.      Assessment and Plan   Pruritus Bilateral Foot Dermatitis Persistent itching and redness, worse at night. History of athlete's foot. Current treatment with antifungal cream and hydrocortisone not effective. Possible fungal infection or allergic reaction vs combined dermatitis/infection -Start Zyrtec in the morning  and Atarax in the evening for itching. -Apply prescribed steroid cream twice daily. -Continue antifungal treatment. -Use aluminum acetate soak or Domeboro for possible fungal infection.  Aluminium acetate soak, Domeboro , 1 pack to 1 quart warm water  Antifungal cream BID after soaks Treat shoes with antifungal powders, avoid occlusive footwear -Refer to podiatry for further evaluation.  Lymphedema History of full body lymphedema following mastectomy in 2012. -Continue current management by using lymph pumps.  Prediabetes Last A1C borderline at 6, last a1c from 05/2022 -Order lab work to check current status.  Polyarthralgia/Joint Pain Reports of pain in multiple joints. History of psoriasis on scalp. -Check rheumatoid factor and general marker for autoimmune disease. -Continue current management.  Nausea Recent episodes of severe dry heaving. -Continue Phenergan as needed.  Breast Cancer History of aggressive treatment with chemotherapy and radiation in 2012. -Continue regular follow-ups as per oncology.  General Health Maintenance -Check labs for inflammation markers and diabetes status. -Follow-up to assess response to new treatment plan.   The patient was advised to call back or seek an in-person evaluation if the symptoms worsen or if the condition fails to improve as anticipated.   Orders Placed This Encounter  Procedures   CBC with Differential/Platelet   Comprehensive metabolic panel    Has the patient fasted?:   Yes   Hemoglobin A1c   Lipid panel    Has the patient fasted?:   Yes   TSH   C-reactive  protein   Sed Rate (ESR)   Rheumatoid Factor   ANA Direct w/Reflex if Positive    No follow-ups on file.   The patient was advised to call back or seek an in-person evaluation if the symptoms worsen or if the condition fails to improve as anticipated.  I discussed the assessment and treatment plan with the patient. The patient was provided an opportunity to ask questions and all were answered. The patient agreed with the plan and demonstrated an understanding of the instructions.  I, Debera Lat, PA-C have reviewed all documentation for this visit. The documentation on 05/13/2023  for the exam, diagnosis, procedures, and orders are all accurate and complete.  Debera Lat, [facility], MMS [facility] [phone] (phone) [phone] (fax)  [facility]

## 2023-05-14 DIAGNOSIS — M2550 Pain in unspecified joint: Secondary | ICD-10-CM | POA: Diagnosis not present

## 2023-05-15 LAB — COMPREHENSIVE METABOLIC PANEL
ALT: 47 [IU]/L — ABNORMAL HIGH (ref 0–32)
AST: 33 [IU]/L (ref 0–40)
Albumin: 4.7 g/dL (ref 3.8–4.9)
Alkaline Phosphatase: 124 [IU]/L — ABNORMAL HIGH (ref 44–121)
BUN/Creatinine Ratio: 18 (ref 9–23)
BUN: 17 mg/dL (ref 6–24)
Bilirubin Total: 0.4 mg/dL (ref 0.0–1.2)
CO2: 23 mmol/L (ref 20–29)
Calcium: 9.6 mg/dL (ref 8.7–10.2)
Chloride: 101 mmol/L (ref 96–106)
Creatinine, Ser: 0.95 mg/dL (ref 0.57–1.00)
Globulin, Total: 2.3 g/dL (ref 1.5–4.5)
Glucose: 121 mg/dL — ABNORMAL HIGH (ref 70–99)
Potassium: 4.9 mmol/L (ref 3.5–5.2)
Sodium: 140 mmol/L (ref 134–144)
Total Protein: 7 g/dL (ref 6.0–8.5)
eGFR: 69 mL/min/{1.73_m2} (ref >59)

## 2023-05-15 LAB — HEMOGLOBIN A1C
Est. average glucose Bld gHb Est-mCnc: 134 mg/dL
Hgb A1c MFr Bld: 6.3 % — ABNORMAL HIGH (ref 4.8–5.6)

## 2023-05-15 LAB — CBC WITH DIFFERENTIAL/PLATELET
Basophils Absolute: 0 10*3/uL (ref 0.0–0.2)
Basos: 1 %
EOS (ABSOLUTE): 0.2 10*3/uL (ref 0.0–0.4)
Eos: 3 %
Hematocrit: 47.7 % — ABNORMAL HIGH (ref 34.0–46.6)
Hemoglobin: 15.6 g/dL (ref 11.1–15.9)
Immature Grans (Abs): 0 10*3/uL (ref 0.0–0.1)
Immature Granulocytes: 0 %
Lymphocytes Absolute: 2.3 10*3/uL (ref 0.7–3.1)
Lymphs: 36 %
MCH: 29.6 pg (ref 26.6–33.0)
MCHC: 32.7 g/dL (ref 31.5–35.7)
MCV: 91 fL (ref 79–97)
Monocytes Absolute: 0.6 10*3/uL (ref 0.1–0.9)
Monocytes: 9 %
Neutrophils Absolute: 3.3 10*3/uL (ref 1.4–7.0)
Neutrophils: 51 %
Platelets: 265 10*3/uL (ref 150–450)
RBC: 5.27 x10E6/uL (ref 3.77–5.28)
RDW: 13 % (ref 11.7–15.4)
WBC: 6.5 10*3/uL (ref 3.4–10.8)

## 2023-05-15 LAB — LIPID PANEL
Chol/HDL Ratio: 4.7 {ratio} — ABNORMAL HIGH (ref 0.0–4.4)
Cholesterol, Total: 210 mg/dL — ABNORMAL HIGH (ref 100–199)
HDL: 45 mg/dL (ref >39)
LDL Chol Calc (NIH): 123 mg/dL — ABNORMAL HIGH (ref 0–99)
Triglycerides: 238 mg/dL — ABNORMAL HIGH (ref 0–149)
VLDL Cholesterol Cal: 42 mg/dL — ABNORMAL HIGH (ref 5–40)

## 2023-05-15 LAB — SEDIMENTATION RATE: Sed Rate: 17 mm/h (ref 0–40)

## 2023-05-15 LAB — ANA W/REFLEX IF POSITIVE: Anti Nuclear Antibody (ANA): NEGATIVE

## 2023-05-15 LAB — C-REACTIVE PROTEIN: CRP: 2 mg/L (ref 0–10)

## 2023-05-15 LAB — RHEUMATOID FACTOR: Rheumatoid fact SerPl-aCnc: <10 [IU]/mL (ref <14.0)

## 2023-05-15 LAB — TSH: TSH: 4.83 u[IU]/mL — ABNORMAL HIGH (ref 0.450–4.500)

## 2023-05-17 ENCOUNTER — Encounter: Payer: Self-pay | Admitting: Physician Assistant

## 2023-05-17 DIAGNOSIS — R21 Rash and other nonspecific skin eruption: Secondary | ICD-10-CM

## 2023-05-17 DIAGNOSIS — Z8619 Personal history of other infectious and parasitic diseases: Secondary | ICD-10-CM

## 2023-05-19 ENCOUNTER — Telehealth: Payer: Self-pay

## 2023-05-19 NOTE — Telephone Encounter (Unsigned)
 Copied from CRM [id number]. Topic: Clinical - Medical Advice >> May 19, 2023 10:24 AM Dennison Nancy wrote: Reason for CRM: Patient callback followup on message sent over to [facility] need assistance soon as possible  Message patient sent on 05/17/23  Hey. I noticed my lab results came back Friday.  The itching is not better & neither the triamcinolone cream, the foot soak or Certirizine has helped.   The hydroxyzine helps for a couple of hours.  You mentioned sending me to a podiatrist at my office visit.   As I mentioned at my visit, I have lymphedema in my feet so I'm trying to prevent any serious complications.

## 2023-05-20 ENCOUNTER — Encounter: Payer: Self-pay | Admitting: Physician Assistant

## 2023-05-21 ENCOUNTER — Encounter: Payer: Self-pay | Admitting: Family Medicine

## 2023-05-21 DIAGNOSIS — G4733 Obstructive sleep apnea (adult) (pediatric): Secondary | ICD-10-CM | POA: Diagnosis not present

## 2023-05-24 ENCOUNTER — Other Ambulatory Visit: Payer: Self-pay | Admitting: Family Medicine

## 2023-05-24 DIAGNOSIS — M5030 Other cervical disc degeneration, unspecified cervical region: Secondary | ICD-10-CM

## 2023-05-24 DIAGNOSIS — I972 Postmastectomy lymphedema syndrome: Secondary | ICD-10-CM

## 2023-05-25 ENCOUNTER — Encounter: Payer: Self-pay | Admitting: Physician Assistant

## 2023-05-25 DIAGNOSIS — Z8619 Personal history of other infectious and parasitic diseases: Secondary | ICD-10-CM | POA: Insufficient documentation

## 2023-05-25 DIAGNOSIS — R21 Rash and other nonspecific skin eruption: Secondary | ICD-10-CM | POA: Insufficient documentation

## 2023-05-25 NOTE — Telephone Encounter (Signed)
 Requested medication (s) are due for refill today: yes  Requested medication (s) are on the active medication list: yes  Last refill:  02/16/23 #20/0  Future visit scheduled: yes  Notes to clinic:  Unable to refill per protocol, cannot delegate.      Requested Prescriptions  Pending Prescriptions Disp Refills   oxyCODONE-acetaminophen (PERCOCET/ROXICET) 5-325 MG tablet [Pharmacy Med Name: OXYCODONE-APAP 5-325 MG TAB] 20 tablet     Sig: TAKE 1 TABLET BY MOUTH EVERY 8 HOURS AS NEEDED FOR SEVERE PAIN     There is no refill protocol information for this order

## 2023-05-29 ENCOUNTER — Other Ambulatory Visit: Payer: Self-pay | Admitting: Family Medicine

## 2023-05-29 DIAGNOSIS — F419 Anxiety disorder, unspecified: Secondary | ICD-10-CM

## 2023-06-04 ENCOUNTER — Encounter: Payer: Self-pay | Admitting: Internal Medicine

## 2023-06-05 NOTE — Progress Notes (Signed)
 Cardiology Office Note  Date:  06/08/2023   ID:  Sabrina Morris, Sabrina Morris 15-Apr-1965, MRN [id number]  PCP:  Sherlyn Hay, DO   Chief Complaint  Patient presents with   New Patient (Initial Visit)    Patient has a history of lymphedema & WPW with a cardiac ablation in 1994 at [facility].  Patient c/o palpitations, shortness of breath & swelling all over & weight gain.     HPI:  Sabrina Morris is a 58 year old woman with past medical history of Wolff-Parkinson-White syndrome, catheter ablation 1994 exertional dyspnea  bilateral lymphedema breast cancer, status post mastectomy,  high dose chemotherapy followed by Dr. Seward Meth at [facility].  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 Jacquenette Shone for palpitations, shortness of breath, swelling, weight gain  On discussion today she reports having Change over the past 2 years,  Prior weight 198,  Now weight 230 pounds Significant weight change, facial swelling, leg swelling, feels like she has edema Can only walk 1 mile, gets short of breath  Prior issues with Hiatal hernia underwent surgery  Using lymphedema pump faithfully, arms and legs, uses it for 5-6 hrs Putting on weight Got a new garments, pump Does manual PT for lymphedema once a month They suggested she had "fluid" Tried lasix 30/20 daily Started losing, down 5 pounds Now weight has stabilized again, remains high  Labs: A1C 6.3 Normal BMP  EKG personally reviewed by myself on todays visit EKG Interpretation Date/Time:  Monday June 08 2023 09:33:32 EDT Ventricular Rate:  106 PR Interval:  134 QRS Duration:  72 QT Interval:  348 QTC Calculation: 462 R Axis:   27  Text Interpretation: Sinus tachycardia Nonspecific ST abnormality No previous ECGs available Confirmed by Julien Nordmann [phone]) on 06/08/2023 9:39:41 AM    PMH:    has a past medical history of Adrenal disorder (HCC), Anemia (11/2018), Breast cancer (HCC), H/O bone density study (2015), H/O colonoscopy, H/O colonoscopy (11/2018), H/O endoscopy (11/2018), Lymphedema, Lymphedema, Migraine, Pap smear for cervical cancer screening ([phone]), and Postmenopausal (07/02/2015).  PSH:    Past Surgical History:  Procedure Laterality Date   breast  Left 02/18/2017   Implant removed   BREAST ENHANCEMENT SURGERY Bilateral 2005   CARDIAC CATHETERIZATION  1994   CARDIAC CATHETERIZATION  1994   MASTECTOMY Right 02/2011   paraesophageal hernia  2021   PORT A CATH INJECTION ([facility] HX)  2013    Current Outpatient Medications  Medication Sig Dispense Refill   butalbital-acetaminophen-caffeine (FIORICET) 50-325-40 MG tablet TAKE 1 TO 2 TABLETS BY MOUTH 2 TIMES DAILY AS NEEDED FOR HEADACHE 60 tablet 1   calcium carbonate 100 mg/ml SUSP Take by mouth.     Calcium-Magnesium-Vitamin D [phone] MG-MG-UNIT TABS Take by mouth. Alternates between 1 tablet once daily and two tablets daily.     cetirizine (ZYRTEC) 10 MG tablet Take 1 tablet (10 mg total) by mouth daily. 30 tablet 11   Cholecalciferol (VITAMIN D3) 5000 units TABS Take by mouth daily at 6 (six) AM.     cyclobenzaprine (FLEXERIL) 5 MG tablet TAKE 1 TABLET BY MOUTH 3 TIMES A DAY AS NEEDED 90 tablet 1   fluocinonide (LIDEX) 0.05 % external solution Apply 1 application topically 2 (two) times daily. 60 mL 3   fluticasone-salmeterol (WIXELA INHUB) 250-50 MCG/ACT AEPB Inhale 1 puff into the lungs in the morning and at  bedtime. 60 each 2   furosemide (LASIX) 20 MG tablet Take 2 tablets (40 mg total) by mouth daily. 180 tablet 1   hydrOXYzine (ATARAX) 25 MG tablet Take 25 mg by mouth 3 (three) times daily.     hydrOXYzine (VISTARIL) 25 MG capsule Take 1 capsule (25 mg total) by mouth every 8 (eight) hours as needed. 30 capsule 0   LORazepam (ATIVAN) 1 MG tablet TAKE 1/2 TABLET BY MOUTH 2 TIMES DAILY AS NEEDED FOR ANXIETY  30 tablet 1   meloxicam (MOBIC) 15 MG tablet Take 1 tablet (15 mg total) by mouth daily. 30 tablet 3   Multiple Vitamin (MULTIVITAMIN) capsule Take 1 capsule by mouth daily.     nystatin ointment (MYCOSTATIN) Apply 1 application topically 2 (two) times daily. At mastectomy site as needed     omeprazole (PRILOSEC) 40 MG capsule Take 1 capsule by mouth daily.     oxyCODONE-acetaminophen (PERCOCET/ROXICET) 5-325 MG tablet Take 1 tablet by mouth every 8 (eight) hours as needed for severe pain (pain score 7-10). 20 tablet 0   phentermine 37.5 MG capsule Take 1 capsule (37.5 mg total) by mouth every morning. 90 capsule 0   potassium chloride (KLOR-CON) 10 MEQ tablet TAKE 1 TABLET BY MOUTH DAILY 90 tablet 1   promethazine (PHENERGAN) 12.5 MG tablet TAKE 1 TABLET BY MOUTH EVERY 8 HOURS AS NEEDED FOR NAUSEA OR VOMITING 30 tablet 0   promethazine (PHENERGAN) 25 MG suppository Place 1 suppository (25 mg total) rectally every 8 (eight) hours as needed for nausea or vomiting. 12 each 0   rizatriptan (MAXALT) 10 MG tablet TAKE 1 TABLET BY MOUTH AT FIRST SIGN OF MIGRAINE SYMPTOMS. IF NO RELIEF, A SECOND TABLET MAY BE TAKEN IN 2 HOURS. LIMIT OF 2 TABLETS PER DAY. 30 tablet 2   triamcinolone cream (KENALOG) 0.1 % Apply 1 Application topically 2 (two) times daily. 60 g 0   valACYclovir (VALTREX) 500 MG tablet TAKE 1 TABLET BY MOUTH 2 TIMES DAILY AS NEEDED FOR FEVER BLISTERS 30 tablet 5   VENTOLIN HFA 108 (90 Base) MCG/ACT inhaler INHALE 2 PUFFS BY MOUTH EVERY 4 TO 6 HOURS AS NEEDED 18 g 3   No current facility-administered medications for this visit.    Allergies:   Sumatriptan, Feraheme [ferumoxytol], Haemophilus influenzae vaccines, Other, Sulfa antibiotics, Sulfamethoxazole, Tamoxifen, Zoladex  [goserelin], Codeine, Hydrocodone-acetaminophen, and Topiramate   Social History:  The patient  reports that she has never smoked. She has never used smokeless tobacco. She reports that she does not drink alcohol and  does not use drugs.   Family History:   family history includes Breast cancer (age of onset: 60) in her sister; Thyroid disease in her sister. She was adopted.    Review of Systems: Review of Systems  Constitutional: Negative.   HENT: Negative.    Respiratory:  Positive for shortness of breath.   Cardiovascular:  Positive for leg swelling.  Gastrointestinal: Negative.   Musculoskeletal: Negative.   Neurological: Negative.   Psychiatric/Behavioral: Negative.    All other systems reviewed and are negative.    PHYSICAL EXAM: VS:  BP (!) 130/90 (BP Location: Left Arm, Patient Position: Sitting, Cuff Size: Normal)   Pulse (!) 106   Ht 5\' 6"  (1.676 m)   Wt 229 lb (103.9 kg)   SpO2 97%   BMI 36.96 kg/m  , BMI Body mass index is 36.96 kg/m. GEN: Well nourished, well developed, in no acute distress HEENT: normal Neck: no JVD, carotid bruits, or  masses Cardiac: RRR; no murmurs, rubs, or gallops,no edema  Respiratory:  clear to auscultation bilaterally, normal work of breathing GI: soft, nontender, nondistended, + BS MS: no deformity or atrophy Skin: warm and dry, no rash Neuro:  Strength and sensation are intact Psych: euthymic mood, full affect   Recent Labs: 05/14/2023: ALT 47; BUN 17; Creatinine, Ser 0.95; Hemoglobin 15.6; Platelets 265; Potassium 4.9; Sodium 140; TSH 4.830    Lipid Panel Lab Results  Component Value Date   CHOL 210 (H) 05/14/2023   HDL 45 05/14/2023   LDLCALC 123 (H) 05/14/2023   TRIG 238 (H) 05/14/2023      Wt Readings from Last 3 Encounters:  06/08/23 229 lb (103.9 kg)  05/13/23 226 lb 9.6 oz (102.8 kg)  02/19/23 231 lb (104.8 kg)       ASSESSMENT AND PLAN:  Problem List Items Addressed This Visit       Cardiology Problems   Ventricular pre-excitation with arrhythmia - Primary   Relevant Orders   EKG 12-Lead (Completed)   Other Visit Diagnoses       SOB (shortness of breath)       Relevant Orders   EKG 12-Lead (Completed)      Palpitations       Relevant Orders   EKG 12-Lead (Completed)      Lymphedema Concerned about 30 pound weight gain, facial fullness, arm and leg swelling Long history total body swelling Lasix 20 twice daily does not seem to be working well Went up to Lasix 30 in the morning 20 in the afternoon, not pulling off the fluid as she would like Recommend she hold Lasix try torsemide 20 twice daily Takes potassium daily Suggest BMP in 2 to 3 weeks time, moderate fluid intake  Peripheral edema Plan as above, change Lasix to torsemide 20 twice daily Moderate fluid intake  Obesity Could consider weight loss medication if tolerated with prior GI history Concerned about injections with lymphedema Could try Rybelsus  Palpitations/sinus tachycardia Will need to monitor heart rate Could consider low-dose beta-blocker  Patient seen in consultation for Jacquenette Shone and will be referred back to her office for ongoing care of the issues detailed above  Signed, Dossie Arbour, M.D., Ph.D. [facility] [place], [place] [phone]

## 2023-06-08 ENCOUNTER — Encounter: Payer: Self-pay | Admitting: Cardiovascular Disease

## 2023-06-08 ENCOUNTER — Ambulatory Visit: Payer: Medicare Other | Attending: Cardiovascular Disease | Admitting: Cardiovascular Disease

## 2023-06-08 VITALS — BP 130/90 | HR 106 | Ht 66.0 in | Wt 229.0 lb

## 2023-06-08 DIAGNOSIS — R0602 Shortness of breath: Secondary | ICD-10-CM

## 2023-06-08 DIAGNOSIS — R600 Localized edema: Secondary | ICD-10-CM

## 2023-06-08 DIAGNOSIS — I456 Pre-excitation syndrome: Secondary | ICD-10-CM

## 2023-06-08 DIAGNOSIS — I890 Lymphedema, not elsewhere classified: Secondary | ICD-10-CM

## 2023-06-08 DIAGNOSIS — R002 Palpitations: Secondary | ICD-10-CM

## 2023-06-08 DIAGNOSIS — Z79899 Other long term (current) drug therapy: Secondary | ICD-10-CM

## 2023-06-08 MED ORDER — TORSEMIDE 20 MG PO TABS: 20.0000 mg | ORAL_TABLET | Freq: Two times a day (BID) | ORAL | 6 refills | Status: AC

## 2023-06-08 NOTE — Patient Instructions (Addendum)
 Medication Instructions:  Please hold the lasix Start torsemide 20 mg twice a day  If you need a refill on your cardiac medications before your next appointment, please call your pharmacy.   Lab work: Your provider would like for you to return in 2-3 weeks to have the following labs drawn: BMP.   Please go to [facility] [address] [facility] [address], [place] [address] You do not need an appointment.  They are open from 8 am- 4:30 pm.  Lunch from 1:00 pm- 2:00 pm You will not need to be fasting.   Testing/Procedures: No new testing needed  Follow-Up: At [facility], you and your health needs are our priority.  As part of our continuing mission to provide you with exceptional heart care, we have created designated Provider Care Teams.  These Care Teams include your primary Cardiologist (physician) and Advanced Practice Providers (APPs -  Physician Assistants and Nurse Practitioners) who all work together to provide you with the care you need, when you need it.  You will need a follow up appointment in 3 month  Providers on your designated Care Team:   Nicolasa Ducking, NP Eula Listen, PA-C Cadence Fransico Michael, [place]  COVID-19 Vaccine Information can be found at: [email or web address] For questions related to vaccine distribution or appointments, please email [email or web address] or call [phone].

## 2023-06-09 ENCOUNTER — Encounter: Payer: Self-pay | Admitting: Cardiovascular Disease

## 2023-06-15 ENCOUNTER — Encounter: Payer: Self-pay | Admitting: Family Medicine

## 2023-06-15 ENCOUNTER — Ambulatory Visit (INDEPENDENT_AMBULATORY_CARE_PROVIDER_SITE_OTHER): Payer: Medicare Other | Admitting: Family Medicine

## 2023-06-15 VITALS — BP 131/78 | HR 108 | Ht 65.0 in | Wt 225.2 lb

## 2023-06-15 DIAGNOSIS — E876 Hypokalemia: Secondary | ICD-10-CM

## 2023-06-15 DIAGNOSIS — I972 Postmastectomy lymphedema syndrome: Secondary | ICD-10-CM | POA: Diagnosis not present

## 2023-06-15 DIAGNOSIS — Z0000 Encounter for general adult medical examination without abnormal findings: Secondary | ICD-10-CM | POA: Insufficient documentation

## 2023-06-15 DIAGNOSIS — N182 Chronic kidney disease, stage 2 (mild): Secondary | ICD-10-CM

## 2023-06-15 DIAGNOSIS — Z0001 Encounter for general adult medical examination with abnormal findings: Secondary | ICD-10-CM

## 2023-06-15 DIAGNOSIS — G43809 Other migraine, not intractable, without status migrainosus: Secondary | ICD-10-CM | POA: Diagnosis not present

## 2023-06-15 DIAGNOSIS — T502X5A Adverse effect of carbonic-anhydrase inhibitors, benzothiadiazides and other diuretics, initial encounter: Secondary | ICD-10-CM

## 2023-06-15 DIAGNOSIS — Z853 Personal history of malignant neoplasm of breast: Secondary | ICD-10-CM

## 2023-06-15 DIAGNOSIS — R7989 Other specified abnormal findings of blood chemistry: Secondary | ICD-10-CM

## 2023-06-15 MED ORDER — POTASSIUM CHLORIDE ER 10 MEQ PO TBCR: 10.0000 meq | EXTENDED_RELEASE_TABLET | Freq: Two times a day (BID) | ORAL | 1 refills | Status: DC

## 2023-06-15 NOTE — Progress Notes (Signed)
 Complete physical exam   Patient: Sabrina Morris   DOB: 03-Feb-1966   58 y.o. Female  MRN: [id number] Visit Date: 06/15/2023  Today's healthcare provider: Sherlyn Hay, DO   Chief Complaint  Patient presents with   Annual Exam   Subjective    Sabrina Morris is a 58 y.o. female who presents today for a complete physical exam.  She reports consuming a general diet.  She tries to exercise but tires easily.  She generally feels fairly well. She reports sleeping poorly. She does have additional problems to discuss today.   HPI  Sabrina Morris is a 58 year old female with lymphedema and edema who presents for a routine physical exam and follow-up on fluid management.  She has been managing lymphedema and edema with manual lymphatic drainage therapy, which she resumed after a year-long gap. Recently, she noticed increased edema, confirmed by her therapist, prompting a follow-up with her cardiologist. She was previously on 40 mg of Lasix daily, which she adjusted to 50 mg, resulting in a 5-pound weight loss over two months. Her cardiologist switched her to 20 mg of torsemide twice daily due to concerns about Lasix's effectiveness. She experienced tachycardia and palpitations after starting torsemide, leading to the addition of potassium supplementation twice daily.  She experiences migraines three to four times a week, accompanied by nausea and vomiting. She manages these with rizatriptan, Fioricet, and Phenergan. She uses magnesium glycinate as a preventative measure, though she has not noticed significant improvement.  She reports persistent hip and low back pain, managed with oxycodone. She alternates between ibuprofen and meloxicam for pain management.  Her ALT levels were noted to be elevated, which she attributes to past ibuprofen use. She has a history of chronic kidney disease stage two, which she monitors regularly.  She experiences sleep disturbances attributed to menopause. She also  reports difficulty with weight management, despite a limited diet and previous weight loss surgery symptoms.  She uses Wixela for asthma management, particularly when walking, but has not needed it recently. She experiences shortness of breath and wheezing, which she attributes to fluid retention and weight gain.  She has a history of breast cancer, treated with chemotherapy and radiation, and has regular mammograms and ultrasounds of her left lymph nodes due to past tenderness and swelling.   TSH mildly elevated 05/14/23 at 4.830   Past Medical History:  Diagnosis Date   Adrenal disorder (HCC)    Anemia 11/2018   Breast cancer (HCC)    H/O bone density study 2015   H/O colonoscopy    sch 06/12/15   H/O colonoscopy 11/2018   H/O endoscopy 11/2018   upper   Lymphedema    Lymphedema    Migraine    Pap smear for cervical cancer screening [id number]   Postmenopausal 07/02/2015   Past Surgical History:  Procedure Laterality Date   breast  Left 02/18/2017   Implant removed   BREAST ENHANCEMENT SURGERY Bilateral 2005   CARDIAC CATHETERIZATION  1994   CARDIAC CATHETERIZATION  1994   MASTECTOMY Right 02/2011   paraesophageal hernia  2021   PORT A CATH INJECTION ([facility] HX)  2013   Social History   Socioeconomic History   Marital status: Divorced    Spouse name: Not on file   Number of children: 0   Years of education: Not on file   Highest education level: Associate degree: academic program  Occupational History   Occupation: Charity fundraiser  Comment: part time   Occupation: disability  Tobacco Use   Smoking status: Never   Smokeless tobacco: Never  Vaping Use   Vaping status: Never Used  Substance and Sexual Activity   Alcohol use: No   Drug use: No   Sexual activity: Not Currently    Birth control/protection: Post-menopausal  Other Topics Concern   Not on file  Social History Narrative   Disability [sec to Lymphedema]; she used to work as a Conservator, 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  No  smoking or alcohol. Lives at home/with sister.    Social Drivers of Health   Financial Resource Strain: High Risk (05/13/2023)   Overall Financial Resource Strain (CARDIA)    Difficulty of Paying Living Expenses: Hard  Food Insecurity: Food Insecurity Present (05/13/2023)   Hunger Vital Sign    Worried About Running Out of Food in the Last Year: Sometimes true    Ran Out of Food in the Last Year: Never true  Transportation Needs: No Transportation Needs (05/13/2023)   PRAPARE - Administrator, Civil Service (Medical): No    Lack of Transportation (Non-Medical): No  Physical Activity: Insufficiently Active (05/13/2023)   Exercise Vital Sign    Days of Exercise per Week: 1 day    Minutes of Exercise per Session: 10 min  Stress: No Stress Concern Present (05/13/2023)   [employer] of Occupational Health - Occupational Stress Questionnaire    Feeling of Stress : Only a little  Social Connections: Unknown (05/13/2023)   Social Connection and Isolation Panel [NHANES]    Frequency of Communication with Friends and Family: Patient declined    Frequency of Social Gatherings with Friends and Family: Patient declined    Attends Religious Services: 1 to 4 times per year    Active Member of [employer] or Organizations: No    Attends Engineer, structural: Not on file    Marital Status: Divorced  Intimate Partner Violence: Not At Risk (06/18/2020)   Humiliation, Afraid, Rape, and Kick questionnaire    Fear of Current or Ex-Partner: No    Emotionally Abused: No    Physically Abused: No    Sexually Abused: No   Family Status  Relation Name Status   Sister Half Alive  No partnership data on file   Family History  Adopted: Yes  Problem Relation Age of Onset   Breast cancer Sister 47       1/2 sister; Genetic testing neg at [facility]   Thyroid disease Sister    Allergies  Allergen Reactions   Sumatriptan Other (See Comments) and Shortness Of Breath   Feraheme [Ferumoxytol]  Nausea Only    Nausea fatigue 1 hour post infusion   Haemophilus Influenzae Vaccines    Other Swelling    INFLUENZA   Sulfa Antibiotics Other (See Comments)    GI distress   Sulfamethoxazole Other (See Comments)   Tamoxifen Other (See Comments)    Edema, excess fluid   Zoladex  [Goserelin]    Codeine Other (See Comments) and Rash    Hyper, increased heart rate   Hydrocodone-Acetaminophen Rash    Hyperactivity Occurred with vicodin; has not had problem with tussionex   Topiramate Other (See Comments)    "Messed with mind"    Patient Care Team: Sherlyn Hay, DO as PCP - General (Family Medicine) Olena Leatherwood., MD (Gastroenterology) [place], [facility], [place] (Obstetrics and Gynecology) Earna Coder, MD as Consulting Physician (Oncology)  Medications: Outpatient Medications Prior to Visit  Medication Sig   butalbital-acetaminophen-caffeine (FIORICET) 50-325-40 MG tablet TAKE 1 TO 2 TABLETS BY MOUTH 2 TIMES DAILY AS NEEDED FOR HEADACHE   calcium carbonate 100 mg/ml SUSP Take by mouth.   Calcium-Magnesium-Vitamin D [phone] MG-MG-UNIT TABS Take by mouth. Alternates between 1 tablet once daily and two tablets daily.   cetirizine (ZYRTEC) 10 MG tablet Take 1 tablet (10 mg total) by mouth daily.   Cholecalciferol (VITAMIN D3) 5000 units TABS Take by mouth daily at 6 (six) AM.   cyclobenzaprine (FLEXERIL) 5 MG tablet TAKE 1 TABLET BY MOUTH 3 TIMES A DAY AS NEEDED   fluocinonide (LIDEX) 0.05 % external solution Apply 1 application topically 2 (two) times daily.   fluticasone-salmeterol (WIXELA INHUB) 250-50 MCG/ACT AEPB Inhale 1 puff into the lungs in the morning and at bedtime.   hydrOXYzine (ATARAX) 25 MG tablet Take 25 mg by mouth 3 (three) times daily.   hydrOXYzine (VISTARIL) 25 MG capsule Take 1 capsule (25 mg total) by mouth every 8 (eight) hours as needed.   LORazepam (ATIVAN) 1 MG tablet TAKE 1/2 TABLET BY MOUTH 2 TIMES DAILY AS  NEEDED FOR ANXIETY   meloxicam (MOBIC) 15 MG tablet Take 1 tablet (15 mg total) by mouth daily.   Multiple Vitamin (MULTIVITAMIN) capsule Take 1 capsule by mouth daily.   nystatin ointment (MYCOSTATIN) Apply 1 application topically 2 (two) times daily. At mastectomy site as needed   omeprazole (PRILOSEC) 40 MG capsule Take 1 capsule by mouth daily.   oxyCODONE-acetaminophen (PERCOCET/ROXICET) 5-325 MG tablet Take 1 tablet by mouth every 8 (eight) hours as needed for severe pain (pain score 7-10).   phentermine 37.5 MG capsule Take 1 capsule (37.5 mg total) by mouth every morning.   promethazine (PHENERGAN) 12.5 MG tablet TAKE 1 TABLET BY MOUTH EVERY 8 HOURS AS NEEDED FOR NAUSEA OR VOMITING   promethazine (PHENERGAN) 25 MG suppository Place 1 suppository (25 mg total) rectally every 8 (eight) hours as needed for nausea or vomiting.   rizatriptan (MAXALT) 10 MG tablet TAKE 1 TABLET BY MOUTH AT FIRST SIGN OF MIGRAINE SYMPTOMS. IF NO RELIEF, A SECOND TABLET MAY BE TAKEN IN 2 HOURS. LIMIT OF 2 TABLETS PER DAY.   torsemide (DEMADEX) 20 MG tablet Take 1 tablet (20 mg total) by mouth 2 (two) times daily.   triamcinolone cream (KENALOG) 0.1 % Apply 1 Application topically 2 (two) times daily.   valACYclovir (VALTREX) 500 MG tablet TAKE 1 TABLET BY MOUTH 2 TIMES DAILY AS NEEDED FOR FEVER BLISTERS   VENTOLIN HFA 108 (90 Base) MCG/ACT inhaler INHALE 2 PUFFS BY MOUTH EVERY 4 TO 6 HOURS AS NEEDED   [DISCONTINUED] potassium chloride (KLOR-CON) 10 MEQ tablet TAKE 1 TABLET BY MOUTH DAILY   No facility-administered medications prior to visit.    Review of Systems  Constitutional:  Negative for chills and fever.  Cardiovascular:  Positive for leg swelling (baseline).  Gastrointestinal:  Positive for constipation (improved with start of torsemide) and nausea (with migraines). Negative for vomiting.  Musculoskeletal:  Positive for myalgias.  Neurological:  Positive for headaches (daily to every other day).  Negative for tremors and speech difficulty.      Objective    BP 131/78   Pulse (!) 108   Ht 5\' 5"  (1.651 m)   Wt 225 lb 3.2 oz (102.2 kg)   SpO2 99%   BMI 37.48 kg/m    Physical Exam Vitals and nursing note reviewed.  Constitutional:  General: She is awake.     Appearance: Normal appearance.  HENT:     Head: Normocephalic and atraumatic.     Right Ear: Tympanic membrane, ear canal and external ear normal.     Left Ear: Tympanic membrane, ear canal and external ear normal.     Nose: Nose normal.     Mouth/Throat:     Mouth: Mucous membranes are moist.     Pharynx: Oropharynx is clear. No oropharyngeal exudate or posterior oropharyngeal erythema.  Eyes:     General: No scleral icterus.    Extraocular Movements: Extraocular movements intact.     Conjunctiva/sclera: Conjunctivae normal.     Pupils: Pupils are equal, round, and reactive to light.  Neck:     Thyroid: No thyromegaly or thyroid tenderness.  Cardiovascular:     Rate and Rhythm: Normal rate and regular rhythm.     Pulses: Normal pulses.     Heart sounds: Normal heart sounds.  Pulmonary:     Effort: Pulmonary effort is normal. No tachypnea, bradypnea or respiratory distress.     Breath sounds: Normal breath sounds. No stridor. No wheezing, rhonchi or rales.  Abdominal:     General: Bowel sounds are normal. There is no distension.     Palpations: Abdomen is soft. There is no mass.     Tenderness: There is no abdominal tenderness. There is no guarding.     Hernia: No hernia is present.  Musculoskeletal:     Cervical back: Normal range of motion and neck supple.     Right lower leg: No edema.     Left lower leg: No edema.  Lymphadenopathy:     Cervical: No cervical adenopathy.  Skin:    General: Skin is warm and dry.  Neurological:     Mental Status: She is alert and oriented to person, place, and time. Mental status is at baseline.  Psychiatric:        Mood and Affect: Mood normal.        Behavior:  Behavior normal.       Last depression screening scores    06/15/2023   10:19 AM 05/13/2023    4:18 PM 02/19/2023    1:24 PM  PHQ 2/9 Scores  PHQ - 2 Score 0 0 0  PHQ- 9 Score 2  3   Last fall risk screening    10/07/2022    3:14 PM  Fall Risk   Falls in the past year? 1  Number falls in past yr: 0  Injury with Fall? 0  Risk for fall due to : No Fall Risks  Follow up Falls evaluation completed   Last Audit-C alcohol use screening    05/13/2023    2:19 PM  Alcohol Use Disorder Test (AUDIT)  1. How often do you have a drink containing alcohol? 0  3. How often do you have six or more drinks on one occasion? 0   A score of 3 or more in women, and 4 or more in men indicates increased risk for alcohol abuse, EXCEPT if all of the points are from question 1   No results found for any visits on 06/15/23.  Assessment & Plan    Routine Health Maintenance and Physical Exam  Exercise Activities and Dietary recommendations  Goals      Prevent falls     Recommend to remove any items from the home that may cause slips or trips.         There is  no immunization history on file for this patient.  Health Maintenance  Topic Date Due   MAMMOGRAM  10/24/2021   INFLUENZA VACCINE  06/15/2023 (Originally 10/16/2022)   Medicare Annual Wellness (AWV)  07/16/2023 (Originally 06/18/2021)   COVID-19 Vaccine (1) 01/19/2024 (Originally [birth year]20/1969)   DTaP/Tdap/Td (1 - Tdap) 06/14/2024 (Originally 12/21/1983)   Pneumococcal Vaccine 40-47 Years old (1 of 2 - PCV) 06/14/2024 (Originally 12/21/1970)   Zoster Vaccines- Shingrix (1 of 2) 06/14/2024 (Originally 12/21/1983)   Colonoscopy  07/31/2025   Cervical Cancer Screening (HPV/Pap Cotest)  06/17/2027   HIV Screening  Completed   HPV VACCINES  Aged Out    Discussed health benefits of physical activity, and encouraged her to engage in regular exercise appropriate for her age and condition.   Annual physical exam  Post-mastectomy lymphedema  syndrome  Diuretic-induced hypokalemia -     Potassium Chloride ER; Take 1 tablet (10 mEq total) by mouth 2 (two) times daily. Take with each torsemide  Dispense: 180 tablet; Refill: 1  Chronic kidney disease, stage 2, mildly decreased GFR  Other migraine without status migrainosus, not intractable  History of breast cancer  Elevated TSH -     TSH Rfx on Abnormal to Free T4  Morbid obesity (HCC)    Annual physical exam Physical exam overall unremarkable except as noted above. Routine lab work is up-to-date.  Previous DEXA scan showed osteopenia.  She will be due for a repeat DEXA scan between August 2024 and August 2029.  Postmastectomy lymphedema syndrome Chronic lymphedema exacerbated by increased edema. Generalized swelling noted by her cardiologist. Previous diuretic, furosemide, was ineffective, leading to a switch to torsemide. Initial side effects included tachycardia and palpitations, addressed by adding potassium supplementation. Weight loss and increased urination indicate improvement in fluid management. Cardiologist recommended torsemide for better efficacy, with potassium supplementation to mitigate side effects. - Continue torsemide 20 mg twice daily - Take potassium supplement with each dose of torsemide; refill sent - Follow up with cardiologist in 3-4 weeks for kidney function tests  Diuretic induced hypokalemia - Addressed as noted above  Chronic Kidney Disease Stage 2 Chronic kidney disease stage 2, well-managed. She is aware of the condition.  No acute concerns.  Continue to monitor.   Migraine Chronic migraines occurring 3-4 times weekly. Managed acutely with rizatriptan and Fioricet, and Phenergan for nausea and vomiting. Magnesium glycinate is taken as a preventative measure, though efficacy is uncertain. Extended-release medications are avoided due to exacerbation of swelling. - Continue current migraine management with rizatriptan, Fioricet, and  Phenergan - Continue magnesium glycinate as a preventative measure  Chronic Pain Chronic hip and low back pain managed with oxycodone, which is effective in relieving pain. She is aware of the need to monitor liver function due to pain medications and has expressed concern about elevated ALT levels. - Continue oxycodone for pain management as needed - Monitor liver function tests  Morbid obesity, with concomitant hypercholesterolemia and CKD 2 Obesity contributing to elevated ALT levels. She is managing weight through diuretic therapy and lifestyle modifications. Monitoring weight closely, with some weight loss noted with diuretic adjustment. - Monitor weight and continue efforts to reduce weight through fluid management and lifestyle changes  Asthma Asthma with occasional wheezing, particularly during physical activity. She has a prescription for Wixela (Advair) but has not been using it regularly. Emphasized the importance of daily use of the maintenance inhaler to improve respiratory function and exercise tolerance. - Start using Wixela (Advair) daily as a maintenance inhaler  History of breast cancer Due for a mammogram and has been receiving annual screenings. She is proactive in scheduling necessary appointments. - Order mammogram and ultrasound for left axillary lymph nodes   Elevated TSH Her TSH was very mildly elevated on her most recent check.  As this may be a normal variant for her, will wait and recheck in 4-6 weeks.   - Order placed and given to patient.    Return in about 3 months (around 09/14/2023) for mAWV with AWV nurse if available; and 6 months for routine f/u.     I discussed the assessment and treatment plan with the patient  The patient was provided an opportunity to ask questions and all were answered. The patient agreed with the plan and demonstrated an understanding of the instructions.   The patient was advised to call back or seek an in-person evaluation if  the symptoms worsen or if the condition fails to improve as anticipated.    Sherlyn Hay, DO  [facility] [phone] (phone) [phone] (fax)  [facility]

## 2023-06-22 ENCOUNTER — Encounter: Payer: Self-pay | Admitting: Family Medicine

## 2023-06-22 DIAGNOSIS — I972 Postmastectomy lymphedema syndrome: Secondary | ICD-10-CM

## 2023-07-01 ENCOUNTER — Encounter: Payer: Self-pay | Admitting: Emergency Medicine

## 2023-07-03 ENCOUNTER — Ambulatory Visit: Payer: Self-pay

## 2023-07-03 ENCOUNTER — Telehealth: Payer: Self-pay | Admitting: Family Medicine

## 2023-07-03 ENCOUNTER — Encounter: Payer: Self-pay | Admitting: Family Medicine

## 2023-07-03 ENCOUNTER — Telehealth: Admitting: Family Medicine

## 2023-07-03 DIAGNOSIS — J40 Bronchitis, not specified as acute or chronic: Secondary | ICD-10-CM

## 2023-07-03 MED ORDER — PREDNISONE 10 MG (21) PO TBPK: ORAL_TABLET | ORAL | 0 refills | Status: DC

## 2023-07-03 MED ORDER — DOXYCYCLINE HYCLATE 100 MG PO TABS: 100.0000 mg | ORAL_TABLET | Freq: Two times a day (BID) | ORAL | 0 refills | Status: AC

## 2023-07-03 NOTE — Progress Notes (Signed)
 Virtual Visit Consent   Sabrina Morris, you are scheduled for a virtual visit with a [facility] provider today. Just as with appointments in the office, your consent must be obtained to participate. Your consent will be active for this visit and any virtual visit you may have with one of our providers in the next 365 days. If you have a MyChart account, a copy of this consent can be sent to you electronically.  As this is a virtual visit, video technology does not allow for your provider to perform a traditional examination. This may limit your provider's ability to fully assess your condition. If your provider identifies any concerns that need to be evaluated in person or the need to arrange testing (such as labs, EKG, etc.), we will make arrangements to do so. Although advances in technology are sophisticated, we cannot ensure that it will always work on either your end or our end. If the connection with a video visit is poor, the visit may have to be switched to a telephone visit. With either a video or telephone visit, we are not always able to ensure that we have a secure connection.  By engaging in this virtual visit, you consent to the provision of healthcare and authorize for your insurance to be billed (if applicable) for the services provided during this visit. Depending on your insurance coverage, you may receive a charge related to this service.  I need to obtain your verbal consent now. Are you willing to proceed with your visit today? Sabrina Morris has provided verbal consent on 07/03/2023 for a virtual visit (video or telephone). Sabrina Huger, FNP  Date: 07/03/2023 10:57 AM   Virtual Visit via Video Note   I, Sabrina Morris, connected with  Sabrina Morris  ([id number], 12/22/1965) on 07/03/23 at 10:45 AM EDT by a video-enabled telemedicine application and verified that I am speaking with the correct person using two identifiers.  Location: Patient: Virtual Visit Location Patient:  Home Provider: Virtual Visit Location Provider: Home Office   I discussed the limitations of evaluation and management by telemedicine and the availability of in person appointments. The patient expressed understanding and agreed to proceed.    History of Present Illness: Sabrina Morris is a 58 y.o. who identifies as a female who was assigned female at birth, and is being seen today for cough, non productive, low grade fever. Wheezing. She says this happens once a year with allergies. She says doxy, prednisone  and tussionex help. She is coughing persistently but not in distress. Sabrina Morris  HPI: HPI  Problems:  Patient Active Problem List   Diagnosis Date Noted   Annual physical exam 06/15/2023   Chronic kidney disease, stage 2, mildly decreased GFR 06/15/2023   Elevated TSH 06/15/2023   Rash of foot 05/25/2023   Hx of athlete's foot 05/25/2023   Chronic right-sided low back pain with right-sided sciatica 02/19/2023   Post-mastectomy lymphedema syndrome 10/07/2022   DDD (degenerative disc disease), cervical 10/07/2022   Osteopenia of lumbar spine 06/17/2022   Class 2 obesity without serious comorbidity with body mass index (BMI) of 36.0 to 36.9 in adult 06/11/2022   Insomnia due to medical condition 04/11/2021   Small intestinal bacterial overgrowth (SIBO) 04/11/2021   Vasomotor symptoms due to menopause 04/17/2020   Fat deposits 07/15/2019   Iron  deficiency anemia due to chronic blood loss 10/26/2018   Adrenal disorder (HCC) 05/05/2017   Diuretic-induced hypokalemia 11/30/2014   Hypercholesteremia 10/03/2014   Acquired lymphedema 10/03/2014  Prediabetes 10/03/2014   Abnormal weight gain 10/03/2014   Ventricular pre-excitation with arrhythmia 10/03/2014   GERD (gastroesophageal reflux disease) 09/22/2014   Anxiety 09/11/2014   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 01/04/2014   History of asthma 10/29/2011   History of breast cancer 01/01/2011   Headache, migraine 06/18/2006    Allergies:   Allergies  Allergen Reactions   Sumatriptan Other (See Comments) and Shortness Of Breath   Feraheme  [Ferumoxytol ] Nausea Only    Nausea fatigue 1 hour post infusion   Haemophilus Influenzae Vaccines    Other Swelling    INFLUENZA   Sulfa Antibiotics Other (See Comments)    GI distress   Sulfamethoxazole Other (See Comments)   Tamoxifen Other (See Comments)    Edema, excess fluid   Zoladex  [Goserelin]    Codeine Other (See Comments) and Rash    Hyper, increased heart rate   Hydrocodone-Acetaminophen  Rash    Hyperactivity Occurred with vicodin; has not had problem with tussionex   Topiramate  Other (See Comments)    "Messed with mind"   Medications:  Current Outpatient Medications:    doxycycline  (VIBRA -TABS) 100 MG tablet, Take 1 tablet (100 mg total) by mouth 2 (two) times daily for 10 days., Disp: 20 tablet, Rfl: 0   predniSONE  (STERAPRED UNI-PAK 21 TAB) 10 MG (21) TBPK tablet, Prednisone  10 mg- 6 day dose pack as directed, #1 no refills., Disp: 1 tablet, Rfl: 0   butalbital -acetaminophen -caffeine  (FIORICET) 50-325-40 MG tablet, TAKE 1 TO 2 TABLETS BY MOUTH 2 TIMES DAILY AS NEEDED FOR HEADACHE, Disp: 60 tablet, Rfl: 1   calcium carbonate 100 mg/ml SUSP, Take by mouth., Disp: , Rfl:    Calcium-Magnesium-Vitamin D  500-250-125 MG-MG-UNIT TABS, Take by mouth. Alternates between 1 tablet once daily and two tablets daily., Disp: , Rfl:    cetirizine  (ZYRTEC ) 10 MG tablet, Take 1 tablet (10 mg total) by mouth daily., Disp: 30 tablet, Rfl: 11   Cholecalciferol (VITAMIN D3) 5000 units TABS, Take by mouth daily at 6 (six) AM., Disp: , Rfl:    cyclobenzaprine  (FLEXERIL ) 5 MG tablet, TAKE 1 TABLET BY MOUTH 3 TIMES A DAY AS NEEDED, Disp: 90 tablet, Rfl: 1   fluocinonide  (LIDEX ) 0.05 % external solution, Apply 1 application topically 2 (two) times daily., Disp: 60 mL, Rfl: 3   fluticasone -salmeterol (WIXELA INHUB) 250-50 MCG/ACT AEPB, Inhale 1 puff into the lungs in the morning and at bedtime.,  Disp: 60 each, Rfl: 2   hydrOXYzine  (ATARAX ) 25 MG tablet, Take 25 mg by mouth 3 (three) times daily., Disp: , Rfl:    hydrOXYzine  (VISTARIL ) 25 MG capsule, Take 1 capsule (25 mg total) by mouth every 8 (eight) hours as needed., Disp: 30 capsule, Rfl: 0   LORazepam  (ATIVAN ) 1 MG tablet, TAKE 1/2 TABLET BY MOUTH 2 TIMES DAILY AS NEEDED FOR ANXIETY, Disp: 30 tablet, Rfl: 1   meloxicam  (MOBIC ) 15 MG tablet, Take 1 tablet (15 mg total) by mouth daily., Disp: 30 tablet, Rfl: 3   Multiple Vitamin (MULTIVITAMIN) capsule, Take 1 capsule by mouth daily., Disp: , Rfl:    nystatin  ointment (MYCOSTATIN ), Apply 1 application topically 2 (two) times daily. At mastectomy site as needed, Disp: , Rfl:    omeprazole (PRILOSEC) 40 MG capsule, Take 1 capsule by mouth daily., Disp: , Rfl:    oxyCODONE -acetaminophen  (PERCOCET/ROXICET) 5-325 MG tablet, Take 1 tablet by mouth every 8 (eight) hours as needed for severe pain (pain score 7-10)., Disp: 20 tablet, Rfl: 0   phentermine  37.5 MG capsule,  Take 1 capsule (37.5 mg total) by mouth every morning., Disp: 90 capsule, Rfl: 0   potassium chloride  (KLOR-CON ) 10 MEQ tablet, Take 1 tablet (10 mEq total) by mouth 2 (two) times daily. Take with each torsemide , Disp: 180 tablet, Rfl: 1   promethazine  (PHENERGAN ) 12.5 MG tablet, TAKE 1 TABLET BY MOUTH EVERY 8 HOURS AS NEEDED FOR NAUSEA OR VOMITING, Disp: 30 tablet, Rfl: 0   promethazine  (PHENERGAN ) 25 MG suppository, Place 1 suppository (25 mg total) rectally every 8 (eight) hours as needed for nausea or vomiting., Disp: 12 each, Rfl: 0   rizatriptan  (MAXALT ) 10 MG tablet, TAKE 1 TABLET BY MOUTH AT FIRST SIGN OF MIGRAINE SYMPTOMS. IF NO RELIEF, A SECOND TABLET MAY BE TAKEN IN 2 HOURS. LIMIT OF 2 TABLETS PER DAY., Disp: 30 tablet, Rfl: 2   torsemide  (DEMADEX ) 20 MG tablet, Take 1 tablet (20 mg total) by mouth 2 (two) times daily., Disp: 60 tablet, Rfl: 6   triamcinolone  cream (KENALOG ) 0.1 %, Apply 1 Application topically 2 (two)  times daily., Disp: 60 g, Rfl: 0   valACYclovir  (VALTREX ) 500 MG tablet, TAKE 1 TABLET BY MOUTH 2 TIMES DAILY AS NEEDED FOR FEVER BLISTERS, Disp: 30 tablet, Rfl: 5   VENTOLIN  HFA 108 (90 Base) MCG/ACT inhaler, INHALE 2 PUFFS BY MOUTH EVERY 4 TO 6 HOURS AS NEEDED, Disp: 18 g, Rfl: 3  Observations/Objective: Patient is well-developed, well-nourished in no acute distress.  Resting comfortably  at home.  Head is normocephalic, atraumatic.  No labored breathing.  Speech is clear and coherent with logical content.  Patient is alert and oriented at baseline.    Assessment and Plan: 1. Bronchitis (Primary)  Increase fluids, humidifier at night, she is advised we can not prescribe any controlled meds and will call her pcp office to see if he will prescribe.   Follow Up Instructions: I discussed the assessment and treatment plan with the patient. The patient was provided an opportunity to ask questions and all were answered. The patient agreed with the plan and demonstrated an understanding of the instructions.  A copy of instructions were sent to the patient via MyChart unless otherwise noted below.     The patient was advised to call back or seek an in-person evaluation if the symptoms worsen or if the condition fails to improve as anticipated.    Estell Dillinger, FNP

## 2023-07-03 NOTE — Telephone Encounter (Signed)
 Chief Complaint: cough Symptoms: cough, fever, wheezing, headache Frequency: x 4 days Pertinent Negatives: Patient denies chest pain Disposition: [] ED /[] Urgent Care (no appt availability in office) / [] Appointment(In office/virtual)/ [x]  [facility]/ [] Home Care/ [] Refused Recommended Disposition /[] [facility]/ []  Follow-up with PCP Additional Notes: states cough started Tuesday and Tuesday night she ran a fever of 99.5. states she normally get bronchitis once a year. States Mucinex not working. States cough is making her migraines worse. States that cough not really productive but sometimes its light green sputum.  States that she does have come SOB with coughing.  Reason for Disposition  Fever present > 3 days (72 hours)  Answer Assessment - Initial Assessment Questions 1. ONSET: When did the cough begin?      Tuesday 2. SEVERITY: How bad is the cough today?      mod 3. SPUTUM: Describe the color of your sputum (none, dry cough; clear, white, yellow, green)     gree 4. HEMOPTYSIS: Are you coughing up any blood? If so ask: How much? (flecks, streaks, tablespoons, etc.)     no 5. DIFFICULTY BREATHING: Are you having difficulty breathing? If Yes, ask: How bad is it? (e.g., mild, moderate, severe)    - MILD: No SOB at rest, mild SOB with walking, speaks normally in sentences, can lie down, no retractions, pulse < 100.    - MODERATE: SOB at rest, SOB with minimal exertion and prefers to sit, cannot lie down flat, speaks in phrases, mild retractions, audible wheezing, pulse 100-120.    - SEVERE: Very SOB at rest, speaks in single words, struggling to breathe, sitting hunched forward, retractions, pulse > 120      mild 6. FEVER: Do you have a fever? If Yes, ask: What is your temperature, how was it measured, and when did it start?     99.1 this morning 7. CARDIAC HISTORY: Do you have any history of heart disease? (e.g., heart attack, congestive heart  failure)      no 8. LUNG HISTORY: Do you have any history of lung disease?  (e.g., pulmonary embolus, asthma, emphysema)     no 9. PE RISK FACTORS: Do you have a history of blood clots? (or: recent major surgery, recent prolonged travel, bedridden)     no 10. OTHER SYMPTOMS: Do you have any other symptoms? (e.g., runny nose, wheezing, chest pain)       Migraine, runny nose, wheezing  Protocols used: Cough - Acute Productive-A-AH

## 2023-07-03 NOTE — Patient Instructions (Addendum)

## 2023-07-03 NOTE — Telephone Encounter (Signed)
 Copied from CRM [id number]. Topic: Clinical - Medication Question >> Jul 03, 2023 11:00 AM Tiffany B wrote: Reason for CRM: Patient had a  [facility] Telehealth appointment today and informed patient she is unable to prescribe chlorpheniramine-HYDROcodone (TUSSIONEX) 10-8 MG/5ML so contact PCP to see if she will prescribe. Caller states this is the only medication that will work for her cough in addition to the medications that was already prescribed at her appointment today.

## 2023-07-03 NOTE — Telephone Encounter (Signed)
 Patient called, left VM to return the call to the office to speak to NT.  \  Copied from CRM [id number]. Topic: Clinical - Medical Advice >> Jul 03, 2023  7:58 AM Sabrina Morris wrote: Reason for CRM: Pt has had bronchitis most of the week. Pt has been prescribed Doxycycline  100mg , Prednisone  20 mg, and Tussionex cough syrup before for her cough. Has been trying to use Mucinex and inhalers but its not working. Wants to know if the doctor can send some rxs to her [facility] [employer].

## 2023-07-06 ENCOUNTER — Other Ambulatory Visit: Payer: Self-pay | Admitting: Family Medicine

## 2023-07-06 DIAGNOSIS — J40 Bronchitis, not specified as acute or chronic: Secondary | ICD-10-CM

## 2023-07-06 DIAGNOSIS — G43809 Other migraine, not intractable, without status migrainosus: Secondary | ICD-10-CM

## 2023-07-06 MED ORDER — HYDROCOD POLI-CHLORPHE POLI ER 10-8 MG/5ML PO SUER: 5.0000 mL | Freq: Two times a day (BID) | ORAL | 0 refills | Status: DC | PRN

## 2023-07-07 ENCOUNTER — Other Ambulatory Visit: Payer: Self-pay | Admitting: Family Medicine

## 2023-07-07 ENCOUNTER — Encounter: Payer: Self-pay | Admitting: Family Medicine

## 2023-07-07 DIAGNOSIS — I972 Postmastectomy lymphedema syndrome: Secondary | ICD-10-CM

## 2023-07-07 DIAGNOSIS — M7989 Other specified soft tissue disorders: Secondary | ICD-10-CM

## 2023-07-07 DIAGNOSIS — Z1231 Encounter for screening mammogram for malignant neoplasm of breast: Secondary | ICD-10-CM

## 2023-07-07 NOTE — Telephone Encounter (Signed)
 Requested Prescriptions  Pending Prescriptions Disp Refills   rizatriptan  (MAXALT ) 10 MG tablet [Pharmacy Med Name: RIZATRIPTAN  BENZOATE 10 MG TAB] 30 tablet 2    Sig: TAKE 1 TABLET BY MOUTH AT FIRST SIGN OF MIGRAINE SYMPTOMS. IF NO RELIEF, A SECOND TABLET MAY BE TAKEN IN 2 HOURS. LIMIT: 2 TABLETS PER DAY.     Neurology:  Migraine Therapy - Triptan Passed - 07/07/2023  3:27 PM      Passed - Last BP in normal range    BP Readings from Last 1 Encounters:  06/15/23 131/78         Passed - Valid encounter within last 12 months    Recent Outpatient Visits           3 weeks ago Annual physical exam   [facility], Asencion Blacksmith, DO   1 month ago Pruritus   [facility] [place], [place], PA-C       Future Appointments             In 2 months Dunn, Elvia Hammans, PA-C [facility] [place]   In 11 months Pard[place], Asencion Blacksmith, DO [facility]

## 2023-07-09 ENCOUNTER — Other Ambulatory Visit: Payer: Self-pay | Admitting: Family Medicine

## 2023-07-09 DIAGNOSIS — M7989 Other specified soft tissue disorders: Secondary | ICD-10-CM

## 2023-07-09 DIAGNOSIS — Z1231 Encounter for screening mammogram for malignant neoplasm of breast: Secondary | ICD-10-CM

## 2023-07-14 ENCOUNTER — Encounter: Payer: Self-pay | Admitting: Internal Medicine

## 2023-07-14 ENCOUNTER — Inpatient Hospital Stay: Payer: Medicare Other

## 2023-07-14 ENCOUNTER — Inpatient Hospital Stay: Payer: Medicare Other | Admitting: Internal Medicine

## 2023-07-16 LAB — BASIC METABOLIC PANEL WITH GFR
BUN/Creatinine Ratio: 13 (ref 9–23)
BUN: 12 mg/dL (ref 6–24)
CO2: 21 mmol/L (ref 20–29)
Calcium: 9.5 mg/dL (ref 8.7–10.2)
Chloride: 101 mmol/L (ref 96–106)
Creatinine, Ser: 0.94 mg/dL (ref 0.57–1.00)
Glucose: 107 mg/dL — ABNORMAL HIGH (ref 70–99)
Potassium: 4.6 mmol/L (ref 3.5–5.2)
Sodium: 140 mmol/L (ref 134–144)
eGFR: 70 mL/min/{1.73_m2} (ref >59)

## 2023-07-16 LAB — TSH RFX ON ABNORMAL TO FREE T4: TSH: 2.76 u[IU]/mL (ref 0.450–4.500)

## 2023-07-17 ENCOUNTER — Encounter: Payer: Self-pay | Admitting: Cardiovascular Disease

## 2023-07-18 ENCOUNTER — Other Ambulatory Visit: Payer: Self-pay | Admitting: Family Medicine

## 2023-07-22 ENCOUNTER — Ambulatory Visit
Admission: RE | Admit: 2023-07-22 | Discharge: 2023-07-22 | Disposition: A | Discharge status: Home / Self Care | Source: Ambulatory Visit | Attending: Family Medicine | Admitting: Family Medicine

## 2023-07-22 ENCOUNTER — Ambulatory Visit

## 2023-07-22 DIAGNOSIS — N6459 Other signs and symptoms in breast: Secondary | ICD-10-CM | POA: Diagnosis not present

## 2023-07-22 DIAGNOSIS — I972 Postmastectomy lymphedema syndrome: Secondary | ICD-10-CM

## 2023-07-22 DIAGNOSIS — M7989 Other specified soft tissue disorders: Secondary | ICD-10-CM | POA: Diagnosis not present

## 2023-07-29 ENCOUNTER — Encounter: Payer: Self-pay | Admitting: Family Medicine

## 2023-07-29 ENCOUNTER — Ambulatory Visit: Payer: Self-pay | Admitting: Family Medicine

## 2023-07-29 DIAGNOSIS — Z1321 Encounter for screening for nutritional disorder: Secondary | ICD-10-CM

## 2023-07-29 DIAGNOSIS — R232 Flushing: Secondary | ICD-10-CM

## 2023-07-29 DIAGNOSIS — E279 Disorder of adrenal gland, unspecified: Secondary | ICD-10-CM

## 2023-08-03 ENCOUNTER — Ambulatory Visit: Payer: Self-pay | Admitting: Family Medicine

## 2023-08-03 ENCOUNTER — Encounter: Payer: Self-pay | Admitting: Cardiovascular Disease

## 2023-08-03 DIAGNOSIS — E876 Hypokalemia: Secondary | ICD-10-CM

## 2023-08-05 DIAGNOSIS — R232 Flushing: Secondary | ICD-10-CM | POA: Diagnosis not present

## 2023-08-06 MED ORDER — POTASSIUM CHLORIDE ER 10 MEQ PO TBCR: 10.0000 meq | EXTENDED_RELEASE_TABLET | Freq: Two times a day (BID) | ORAL | 3 refills | Status: AC

## 2023-08-07 LAB — TRYPTASE: Tryptase: 8.6 ug/L (ref 2.2–13.2)

## 2023-08-12 ENCOUNTER — Other Ambulatory Visit: Payer: Self-pay | Admitting: Family Medicine

## 2023-08-12 DIAGNOSIS — I972 Postmastectomy lymphedema syndrome: Secondary | ICD-10-CM

## 2023-08-12 DIAGNOSIS — G8929 Other chronic pain: Secondary | ICD-10-CM

## 2023-08-12 DIAGNOSIS — M5030 Other cervical disc degeneration, unspecified cervical region: Secondary | ICD-10-CM

## 2023-08-14 ENCOUNTER — Other Ambulatory Visit: Payer: Self-pay | Admitting: *Deleted

## 2023-08-14 ENCOUNTER — Other Ambulatory Visit: Payer: Self-pay

## 2023-08-14 DIAGNOSIS — I972 Postmastectomy lymphedema syndrome: Secondary | ICD-10-CM

## 2023-08-14 DIAGNOSIS — D500 Iron deficiency anemia secondary to blood loss (chronic): Secondary | ICD-10-CM

## 2023-08-14 DIAGNOSIS — M5030 Other cervical disc degeneration, unspecified cervical region: Secondary | ICD-10-CM

## 2023-08-17 ENCOUNTER — Encounter: Payer: Self-pay | Admitting: Internal Medicine

## 2023-08-17 ENCOUNTER — Inpatient Hospital Stay

## 2023-08-17 ENCOUNTER — Other Ambulatory Visit: Payer: Self-pay

## 2023-08-17 ENCOUNTER — Inpatient Hospital Stay: Attending: Internal Medicine

## 2023-08-17 ENCOUNTER — Inpatient Hospital Stay: Admitting: Internal Medicine

## 2023-08-17 VITALS — BP 123/82 | HR 106 | Temp 98.4°F | Resp 12 | Ht 65.0 in | Wt 227.6 lb

## 2023-08-17 DIAGNOSIS — Z8349 Family history of other endocrine, nutritional and metabolic diseases: Secondary | ICD-10-CM | POA: Diagnosis not present

## 2023-08-17 DIAGNOSIS — D500 Iron deficiency anemia secondary to blood loss (chronic): Secondary | ICD-10-CM | POA: Insufficient documentation

## 2023-08-17 DIAGNOSIS — M549 Dorsalgia, unspecified: Secondary | ICD-10-CM | POA: Insufficient documentation

## 2023-08-17 DIAGNOSIS — Z1732 Human epidermal growth factor receptor 2 negative status: Secondary | ICD-10-CM | POA: Diagnosis not present

## 2023-08-17 DIAGNOSIS — D751 Secondary polycythemia: Secondary | ICD-10-CM | POA: Insufficient documentation

## 2023-08-17 DIAGNOSIS — Z79899 Other long term (current) drug therapy: Secondary | ICD-10-CM | POA: Diagnosis not present

## 2023-08-17 DIAGNOSIS — G4733 Obstructive sleep apnea (adult) (pediatric): Secondary | ICD-10-CM | POA: Diagnosis not present

## 2023-08-17 DIAGNOSIS — R5383 Other fatigue: Secondary | ICD-10-CM | POA: Diagnosis not present

## 2023-08-17 DIAGNOSIS — Z1721 Progesterone receptor positive status: Secondary | ICD-10-CM | POA: Diagnosis not present

## 2023-08-17 DIAGNOSIS — I890 Lymphedema, not elsewhere classified: Secondary | ICD-10-CM | POA: Diagnosis not present

## 2023-08-17 DIAGNOSIS — Z887 Allergy status to serum and vaccine status: Secondary | ICD-10-CM | POA: Diagnosis not present

## 2023-08-17 DIAGNOSIS — M2550 Pain in unspecified joint: Secondary | ICD-10-CM | POA: Diagnosis not present

## 2023-08-17 DIAGNOSIS — C50911 Malignant neoplasm of unspecified site of right female breast: Secondary | ICD-10-CM | POA: Diagnosis not present

## 2023-08-17 DIAGNOSIS — Z170 Estrogen receptor positive status [ER+]: Secondary | ICD-10-CM | POA: Diagnosis not present

## 2023-08-17 DIAGNOSIS — Z9011 Acquired absence of right breast and nipple: Secondary | ICD-10-CM | POA: Diagnosis not present

## 2023-08-17 DIAGNOSIS — Z5941 Food insecurity: Secondary | ICD-10-CM | POA: Insufficient documentation

## 2023-08-17 DIAGNOSIS — Z885 Allergy status to narcotic agent status: Secondary | ICD-10-CM | POA: Insufficient documentation

## 2023-08-17 DIAGNOSIS — Z5986 Financial insecurity: Secondary | ICD-10-CM | POA: Diagnosis not present

## 2023-08-17 DIAGNOSIS — Z882 Allergy status to sulfonamides status: Secondary | ICD-10-CM | POA: Insufficient documentation

## 2023-08-17 DIAGNOSIS — Z803 Family history of malignant neoplasm of breast: Secondary | ICD-10-CM | POA: Insufficient documentation

## 2023-08-17 DIAGNOSIS — K449 Diaphragmatic hernia without obstruction or gangrene: Secondary | ICD-10-CM | POA: Insufficient documentation

## 2023-08-17 LAB — IRON AND TIBC
Iron: 54 ug/dL (ref 28–170)
Saturation Ratios: 12 % (ref 10.4–31.8)
TIBC: 438 ug/dL (ref 250–450)
UIBC: 384 ug/dL

## 2023-08-17 LAB — CBC WITH DIFFERENTIAL (CANCER CENTER ONLY)
Abs Immature Granulocytes: 0.03 10*3/uL (ref 0.00–0.07)
Basophils Absolute: 0.1 10*3/uL (ref 0.0–0.1)
Basophils Relative: 1 %
Eosinophils Absolute: 0.2 10*3/uL (ref 0.0–0.5)
Eosinophils Relative: 2 %
HCT: 46.9 % — ABNORMAL HIGH (ref 36.0–46.0)
Hemoglobin: 15.7 g/dL — ABNORMAL HIGH (ref 12.0–15.0)
Immature Granulocytes: 0 %
Lymphocytes Relative: 27 %
Lymphs Abs: 2.3 10*3/uL (ref 0.7–4.0)
MCH: 29.9 pg (ref 26.0–34.0)
MCHC: 33.5 g/dL (ref 30.0–36.0)
MCV: 89.3 fL (ref 80.0–100.0)
Monocytes Absolute: 0.7 10*3/uL (ref 0.1–1.0)
Monocytes Relative: 9 %
Neutro Abs: 5 10*3/uL (ref 1.7–7.7)
Neutrophils Relative %: 61 %
Platelet Count: 274 10*3/uL (ref 150–400)
RBC: 5.25 MIL/uL — ABNORMAL HIGH (ref 3.87–5.11)
RDW: 12.9 % (ref 11.5–15.5)
WBC Count: 8.2 10*3/uL (ref 4.0–10.5)
nRBC: 0 % (ref 0.0–0.2)

## 2023-08-17 LAB — CMP (CANCER CENTER ONLY)
ALT: 24 U/L (ref 0–44)
AST: 24 U/L (ref 15–41)
Albumin: 4.3 g/dL (ref 3.5–5.0)
Alkaline Phosphatase: 106 U/L (ref 38–126)
Anion gap: 11 (ref 5–15)
BUN: 19 mg/dL (ref 6–20)
CO2: 24 mmol/L (ref 22–32)
Calcium: 9 mg/dL (ref 8.9–10.3)
Chloride: 102 mmol/L (ref 98–111)
Creatinine: 0.92 mg/dL (ref 0.44–1.00)
GFR, Estimated: >60 mL/min (ref >60)
Glucose, Bld: 104 mg/dL — ABNORMAL HIGH (ref 70–99)
Potassium: 3.8 mmol/L (ref 3.5–5.1)
Sodium: 137 mmol/L (ref 135–145)
Total Bilirubin: 0.5 mg/dL (ref 0.0–1.2)
Total Protein: 7.7 g/dL (ref 6.5–8.1)

## 2023-08-17 LAB — FERRITIN: Ferritin: 67 ng/mL (ref 11–307)

## 2023-08-17 LAB — VITAMIN B12: Vitamin B-12: 246 pg/mL (ref 180–914)

## 2023-08-17 NOTE — Progress Notes (Signed)
 [facility]  Patient Care Team: Pardue, Asencion Blacksmith, DO as PCP - General (Family Medicine) Gaylord Kearns., MD (Gastroenterology) Pa, [facility], [place] (Obstetrics and Gynecology) Gwyn Leos, MD as Consulting Physician (Oncology)  CHIEF COMPLAINTS/PURPOSE OF CONSULTATION: Anemia/ breast cancer   HEMATOLOGY HISTORY  # Iron  deficiency anemia -AUG 3rd 2020-hemoglobin 7.4 MCV 67; ferritin 3/iron  saturation 3% [ EGD/colonoscopy- 2017 [screening;[facility]; Dr.Buccini; [place];capsule-none]. SEP 2020- CT abdomen pelvis-large hiatal hernia.  Feraheme -intolerance; OCT 2020-[facility] [scopes/capsule]-  #Hiatal hernia status post surgery April 2021 [facility]; anemia resolved  # July 2021-erythrocytosis hemoglobin 16  #Right breast cancer ER PR positive HER-2 negative; T3N1 at 45 right s/p mastectomy N-1/20 LN- ALND s/p RT; TAC x 6 cycles [Dr.Marcom/Dr.Gudena]  # Lymphedema [Dr.Malone]  Oncology History  History of breast cancer  01/01/2011 Initial Diagnosis   Patient presented with palpable lump in the right breast a dumbbell-shaped tumor was identified spanning 7 cm   02/24/2011 Surgery   Right mastectomy revealed a dumbbell-shaped mass 3.5 cm and 2.5 cm with a total span of 7 cm 1/20 lymph nodes were positive ER/PR positive HER-2 negative   05/07/2011 - 09/04/2011 Chemotherapy   Adjuvant chemotherapy with Taxol, Adriamycin, Cytoxan every 3 weeks 6 cycles at [facility] ([email or web address])   09/19/2011 - 10/29/2011 Radiation Therapy   Adjuvant radiation therapy 30 fractions including right axilla   12/01/2011 - 04/01/2013 Anti-estrogen oral therapy   Adjuvant tamoxifen was started reluctantly, Zoladex was added January 2014 and antiestrogen therapy was discontinued January 2015 (profound lymphedema and anasarca )     HISTORY OF PRESENTING ILLNESS: Ambulating independently.  Alone.  Sabrina Morris 58 y.o.  female with prior  history of above breast cancer; and Iron  deficiency anemia; erythrocytosis secondary is here for follow-up.  Patient had been treated for bronchitis 05/2023. Doing better, uses inhaler prn.   Pt continues to have chronic fatigue. Has full body lymphedema as well. Patient having difficulty with wearing the mask because of facial lymphedema  Patient has chronic  joint and back pain.   Review of Systems  Constitutional:  Positive for malaise/fatigue. Negative for chills, diaphoresis and fever.  HENT:  Negative for nosebleeds and sore throat.   Eyes:  Negative for double vision.  Respiratory:  Negative for cough, hemoptysis, sputum production and wheezing.   Cardiovascular:  Negative for chest pain, palpitations, orthopnea and leg swelling.  Gastrointestinal:  Negative for abdominal pain, blood in stool, constipation, diarrhea, heartburn, melena, nausea and vomiting.  Genitourinary:  Negative for dysuria, frequency and urgency.  Musculoskeletal:  Positive for back pain and joint pain.  Skin: Negative.  Negative for itching and rash.  Neurological:  Negative for dizziness, tingling, focal weakness, weakness and headaches.  Endo/Heme/Allergies:  Does not bruise/bleed easily.  Psychiatric/Behavioral:  Negative for depression. The patient is not nervous/anxious and does not have insomnia.     MEDICAL HISTORY:  Past Medical History:  Diagnosis Date   Adrenal disorder (HCC)    Anemia 11/2018   Breast cancer (HCC)    H/O bone density study 2015   H/O colonoscopy    sch 06/12/15   H/O colonoscopy 11/2018   H/O endoscopy 11/2018   upper   Lymphedema    Lymphedema    Migraine    Pap smear for cervical cancer screening [id number]   Postmenopausal 07/02/2015    SURGICAL HISTORY: Past Surgical History:  Procedure Laterality Date   breast  Left 02/18/2017  Implant removed   BREAST ENHANCEMENT SURGERY Bilateral 2005   CARDIAC CATHETERIZATION  1994   CARDIAC CATHETERIZATION  1994    MASTECTOMY Right 02/2011   paraesophageal hernia  2021   PORT A CATH INJECTION ([facility] HX)  2013    SOCIAL HISTORY: Social History   Socioeconomic History   Marital status: Divorced    Spouse name: Not on file   Number of children: 0   Years of education: Not on file   Highest education level: Associate degree: academic program  Occupational History   Occupation: Charity fundraiser    Comment: part time   Occupation: disability  Tobacco Use   Smoking status: Never   Smokeless tobacco: Never  Vaping Use   Vaping status: Never Used  Substance and Sexual Activity   Alcohol use: No   Drug use: No   Sexual activity: Not Currently    Birth control/protection: Post-menopausal  Other Topics Concern   Not on file  Social History Narrative   Disability [sec to Lymphedema]; 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  No smoking or alcohol. Lives at home/with sister.    Social Drivers of Health   Financial Resource Strain: High Risk (05/13/2023)   Overall Financial Resource Strain (CARDIA)    Difficulty of Paying Living Expenses: Hard  Food Insecurity: Food Insecurity Present (05/13/2023)   Hunger Vital Sign    Worried About Running Out of Food in the Last Year: Sometimes true    Ran Out of Food in the Last Year: Never true  Transportation Needs: No Transportation Needs (05/13/2023)   PRAPARE - Administrator, Civil Service (Medical): No    Lack of Transportation (Non-Medical): No  Physical Activity: Insufficiently Active (05/13/2023)   Exercise Vital Sign    Days of Exercise per Week: 1 day    Minutes of Exercise per Session: 10 min  Stress: No Stress Concern Present (05/13/2023)   [employer] of Occupational Health - Occupational Stress Questionnaire    Feeling of Stress : Only a little  Social Connections: Unknown (05/13/2023)   Social Connection and Isolation Panel [NHANES]    Frequency of Communication with Friends and Family: Patient declined    Frequency of  Social Gatherings with Friends and Family: Patient declined    Attends Religious Services: 1 to 4 times per year    Active Member of [employer] or Organizations: No    Attends Engineer, structural: Not on file    Marital Status: Divorced  Intimate Partner Violence: Not At Risk (06/18/2020)   Humiliation, Afraid, Rape, and Kick questionnaire    Fear of Current or Ex-Partner: No    Emotionally Abused: No    Physically Abused: No    Sexually Abused: No    FAMILY HISTORY: Family History  Adopted: Yes  Problem Relation Age of Onset   Breast cancer Sister 62       1/2 sister; Genetic testing neg at [facility]   Thyroid  disease Sister     ALLERGIES:  is allergic to sumatriptan, feraheme  [ferumoxytol ], haemophilus influenzae vaccines, other, sulfa antibiotics, sulfamethoxazole, tamoxifen, zoladex  [goserelin], codeine, hydrocodone-acetaminophen , and topiramate .  MEDICATIONS:  Current Outpatient Medications  Medication Sig Dispense Refill   butalbital -acetaminophen -caffeine  (FIORICET) 50-325-40 MG tablet TAKE 1 TO 2 TABLETS BY MOUTH 2 TIMES DAILY AS NEEDED FOR HEADACHE 60 tablet 1   calcium carbonate 100 mg/ml SUSP Take by mouth.     cetirizine  (ZYRTEC ) 10 MG tablet Take 1 tablet (10  mg total) by mouth daily. 30 tablet 11   Cholecalciferol (VITAMIN D3) 5000 units TABS Take by mouth daily at 6 (six) AM.     cyclobenzaprine  (FLEXERIL ) 5 MG tablet TAKE 1 TABLET BY MOUTH 3 TIMES A DAY AS NEEDED 90 tablet 1   fluocinonide  (LIDEX ) 0.05 % external solution Apply 1 application topically 2 (two) times daily. 60 mL 3   fluticasone -salmeterol (WIXELA INHUB) 250-50 MCG/ACT AEPB Inhale 1 puff into the lungs in the morning and at bedtime. 60 each 2   hydrOXYzine  (ATARAX ) 25 MG tablet Take 25 mg by mouth 3 (three) times daily as needed.     LORazepam  (ATIVAN ) 1 MG tablet TAKE 1/2 TABLET BY MOUTH 2 TIMES DAILY AS NEEDED FOR ANXIETY 30 tablet 1   meloxicam  (MOBIC ) 15 MG tablet Take 1 tablet (15 mg total) by  mouth daily. 30 tablet 3   Multiple Vitamin (MULTIVITAMIN) capsule Take 1 capsule by mouth daily.     nystatin  ointment (MYCOSTATIN ) Apply 1 application topically 2 (two) times daily. At mastectomy site as needed     omeprazole (PRILOSEC) 40 MG capsule Take 1 capsule by mouth daily.     oxyCODONE -acetaminophen  (PERCOCET/ROXICET) 5-325 MG tablet Take 1 tablet by mouth every 8 (eight) hours as needed for severe pain (pain score 7-10). 20 tablet 0   phentermine  37.5 MG capsule Take 1 capsule (37.5 mg total) by mouth every morning. 90 capsule 0   potassium chloride  (KLOR-CON ) 10 MEQ tablet Take 1 tablet (10 mEq total) by mouth 2 (two) times daily. Take with each torsemide  180 tablet 3   promethazine  (PHENERGAN ) 12.5 MG tablet TAKE 1 TABLET BY MOUTH EVERY 8 HOURS AS NEEDED FOR NAUSEA OR VOMITING 30 tablet 0   rizatriptan  (MAXALT ) 10 MG tablet TAKE 1 TABLET BY MOUTH AT FIRST SIGN OF MIGRAINE SYMPTOMS. IF NO RELIEF, A SECOND TABLET MAY BE TAKEN IN 2 HOURS. LIMIT: 2 TABLETS PER DAY. 30 tablet 2   torsemide  (DEMADEX ) 20 MG tablet Take 1 tablet (20 mg total) by mouth 2 (two) times daily. 60 tablet 6   valACYclovir  (VALTREX ) 500 MG tablet TAKE 1 TABLET BY MOUTH 2 TIMES DAILY AS NEEDED FOR FEVER BLISTERS 30 tablet 5   VENTOLIN  HFA 108 (90 Base) MCG/ACT inhaler INHALE 2 PUFFS BY MOUTH EVERY 4 TO 6 HOURS AS NEEDED 18 g 3   chlorpheniramine-HYDROcodone (TUSSIONEX) 10-8 MG/5ML Take 5 mLs by mouth every 12 (twelve) hours as needed for cough. 70 mL 0   hydrOXYzine  (VISTARIL ) 25 MG capsule Take 1 capsule (25 mg total) by mouth every 8 (eight) hours as needed. 30 capsule 0   predniSONE  (STERAPRED UNI-PAK 21 TAB) 10 MG (21) TBPK tablet Prednisone  10 mg- 6 day dose pack as directed, #1 no refills. 1 tablet 0   promethazine  (PHENERGAN ) 25 MG suppository Place 1 suppository (25 mg total) rectally every 8 (eight) hours as needed for nausea or vomiting. 12 each 0   triamcinolone  cream (KENALOG ) 0.1 % Apply 1 Application  topically 2 (two) times daily. 60 g 0   No current facility-administered medications for this visit.      PHYSICAL EXAMINATION:   Vitals:   08/17/23 1320  BP: 123/82  Pulse: (!) 106  Resp: 12  Temp: 98.4 F (36.9 C)  SpO2: 99%    Filed Weights   08/17/23 1320  Weight: 227 lb 9.6 oz (103.2 kg)     Physical Exam HENT:     Head: Normocephalic and atraumatic.  Mouth/Throat:     Pharynx: No oropharyngeal exudate.  Eyes:     Pupils: Pupils are equal, round, and reactive to light.  Cardiovascular:     Rate and Rhythm: Normal rate and regular rhythm.  Pulmonary:     Effort: Pulmonary effort is normal. No respiratory distress.     Breath sounds: Normal breath sounds. No wheezing.  Abdominal:     General: Bowel sounds are normal. There is no distension.     Palpations: Abdomen is soft. There is no mass.     Tenderness: There is no abdominal tenderness. There is no guarding or rebound.  Musculoskeletal:        General: No tenderness. Normal range of motion.     Cervical back: Normal range of motion and neck supple.  Skin:    General: Skin is warm.  Neurological:     Mental Status: She is alert and oriented to person, place, and time.  Psychiatric:        Mood and Affect: Affect normal.     LABORATORY DATA:  I have reviewed the data as listed Lab Results  Component Value Date   WBC 8.2 08/17/2023   HGB 15.7 (H) 08/17/2023   HCT 46.9 (H) 08/17/2023   MCV 89.3 08/17/2023   PLT 274 08/17/2023   Recent Labs    05/14/23 0940 07/15/23 1409 08/17/23 1306  NA 140 140 137  K 4.9 4.6 3.8  CL 101 101 102  CO2 23 21 24   GLUCOSE 121* 107* 104*  BUN 17 12 19   CREATININE 0.95 0.94 0.92  CALCIUM 9.6 9.5 9.0  GFRNONAA  --   --  >60  PROT 7.0  --  7.7  ALBUMIN 4.7  --  4.3  AST 33  --  24  ALT 47*  --  24  ALKPHOS 124*  --  106  BILITOT 0.4  --  0.5     MM 3D DIAGNOSTIC MAMMOGRAM UNILATERAL LEFT BREAST Addendum Date: 08/14/2023 ADDENDUM REPORT: 08/14/2023  14:56 ADDENDUM: Left axillary ultrasound was also performed Electronically Signed   By: Dru Georges M.D.   On: 08/14/2023 14:56   Result Date: 08/14/2023 CLINICAL DATA:  58 year old female with history of remote right mastectomy here complaining of left axillary swelling and tenderness. EXAM: DIGITAL DIAGNOSTIC UNILATERAL LEFT MAMMOGRAM WITH TOMOSYNTHESIS AND CAD TECHNIQUE: Left digital diagnostic mammography and breast tomosynthesis was performed. The images were evaluated with computer-aided detection. COMPARISON:  Previous exam(s). ACR Breast Density Category b: There are scattered areas of fibroglandular density. FINDINGS: No suspicious masses, microcalcifications or architectural distortions in the left breast. Spot tomosynthesis views of the axilla demonstrate normal lymph nodes. No mammographic evidence of malignancy. On physical exam no discrete mass is palpated in the left axilla. No obvious swelling. Targeted ultrasound is performed, showing normal appearing lymph node without evidence of subcutaneous edema, mass or suspicious finding. IMPRESSION: No mammographic or sonographic evidence of malignancy. RECOMMENDATION: Routine annual screening mammogram in 1 year. I have discussed the findings and recommendations with the patient. If applicable, a reminder letter will be sent to the patient regarding the next appointment. BI-RADS CATEGORY  1: Negative. Electronically Signed: By: Dru Georges M.D. On: 07/22/2023 10:07   US  AXILLA LEFT Result Date: 07/29/2023 CLINICAL DATA:  58 year old female with history of remote right mastectomy here complaining of left axillary swelling and tenderness. EXAM: DIGITAL DIAGNOSTIC UNILATERAL LEFT MAMMOGRAM WITH TOMOSYNTHESIS AND CAD TECHNIQUE: Left digital diagnostic mammography and breast tomosynthesis was performed. The images were  evaluated with computer-aided detection. COMPARISON:  Previous exam(s). ACR Breast Density Category b: There are scattered areas of  fibroglandular density. FINDINGS: No suspicious masses, microcalcifications or architectural distortions in the left breast. Spot tomosynthesis views of the axilla demonstrate normal lymph nodes. No mammographic evidence of malignancy. On physical exam no discrete mass is palpated in the left axilla. No obvious swelling. Targeted ultrasound is performed, showing normal appearing lymph node without evidence of subcutaneous edema, mass or suspicious finding. IMPRESSION: No mammographic or sonographic evidence of malignancy. RECOMMENDATION: Routine annual screening mammogram in 1 year. I have discussed the findings and recommendations with the patient. If applicable, a reminder letter will be sent to the patient regarding the next appointment. BI-RADS CATEGORY  1: Negative. Electronically Signed   By: Dru Georges M.D.   On: 07/29/2023 09:58    Iron  deficiency anemia due to chronic blood loss # History Severe iron  deficient anemia-question blood loss appears chronic MCV- resolved s/p since fundoplication. Today hemoglobin is 15.7- stable. HOLD venofer -  # Erythrocytosis secondary- ? Diuretics/ sleep apnea-difficulty wearing CPAP [issues Lymphedema]- [JAK-2/exon -12- NEG]--  STABLE. do not recommend phlebotomy.   # Lymphedema chest wall-sec to breast surgery/? Surgery- on PT; on lasix .  Given the difficulty wearing the CPAP s/p evaluation with Evone Hoh.  STABLE  # History of breast cancer RIght -stable on surveillance-mammogram left breast unilateral-MAY 2025- WNL stable.   # fatigue: # Fatigue: hx of ? OSA- otherwise, patient has no other identifiable causes of fatigue at this time.  I had a long discussion with the patient regarding the ability of structured exercise program help treat cancer related fatigue.  Introduced the care program available to cancer patients at [facility]. Patient is interested.   # DISPOSITION: #  Refer to care program: re: Fatigue/Hx of breast cancer # HOLD IV Iron  today #  follow up in 12  months; MD; labs- cbc/cmp;iron  studies/ferritin; B12 levels; possible venofer -Dr.B  Cc; Dr.Gudena  All questions were answered. The patient knows to call the clinic with any problems, questions or concerns.    Gwyn Leos, MD 08/17/2023 2:11 PM

## 2023-08-17 NOTE — Assessment & Plan Note (Addendum)
#   History Severe iron  deficient anemia-question blood loss appears chronic MCV- resolved s/p since fundoplication. Today hemoglobin is 15.7- stable. HOLD venofer -  # Erythrocytosis secondary- ? Diuretics/ sleep apnea-difficulty wearing CPAP [issues Lymphedema]- [JAK-2/exon -12- NEG]--  STABLE. do not recommend phlebotomy.   # Lymphedema chest wall-sec to breast surgery/? Surgery- on PT; on lasix .  Given the difficulty wearing the CPAP s/p evaluation with Evone Hoh.  STABLE  # History of breast cancer RIght -stable on surveillance-mammogram left breast unilateral-MAY 2025- WNL stable.   # fatigue: # Fatigue: hx of ? OSA- otherwise, patient has no other identifiable causes of fatigue at this time.  I had a long discussion with the patient regarding the ability of structured exercise program help treat cancer related fatigue.  Introduced the care program available to cancer patients at [facility]. Patient is interested.   # DISPOSITION: #  Refer to care program: re: Fatigue/Hx of breast cancer # HOLD IV Iron  today # follow up in 12  months; MD; labs- cbc/cmp;iron  studies/ferritin; B12 levels; possible venofer -Dr.B  Cc; Dr.Gudena

## 2023-08-17 NOTE — Progress Notes (Signed)
 Fatigue/weakness: YES Dyspena:  GETTING WORSE Light headedness: NO Blood in stool: NO   Treated for bronchitis 05/2023. Doing better, uses inhaler prn. EKG done with Dr. Gollan 06/09/23.

## 2023-08-19 MED ORDER — OXYCODONE-ACETAMINOPHEN 5-325 MG PO TABS: 1.0000 | ORAL_TABLET | Freq: Three times a day (TID) | ORAL | 0 refills | Status: DC | PRN

## 2023-08-19 NOTE — Telephone Encounter (Signed)
 A courtesy refill for oxycodone . Cannot refill meloxicam  as it was prescribed in 2024 but not dispensed, per med dispense history. Could discuss the need for meloxicam  at her appointment with pcp

## 2023-08-20 DIAGNOSIS — C50511 Malignant neoplasm of lower-outer quadrant of right female breast: Secondary | ICD-10-CM | POA: Diagnosis not present

## 2023-08-20 DIAGNOSIS — I890 Lymphedema, not elsewhere classified: Secondary | ICD-10-CM | POA: Diagnosis not present

## 2023-08-20 DIAGNOSIS — R29898 Other symptoms and signs involving the musculoskeletal system: Secondary | ICD-10-CM | POA: Diagnosis not present

## 2023-08-21 DIAGNOSIS — G4733 Obstructive sleep apnea (adult) (pediatric): Secondary | ICD-10-CM | POA: Diagnosis not present

## 2023-08-25 ENCOUNTER — Telehealth: Payer: Self-pay

## 2023-08-25 NOTE — Telephone Encounter (Signed)
 Copied from CRM [id number]. Topic: Appointments - Appointment Cancel/Reschedule >> Aug 25, 2023 11:58 AM Hobson Luna F wrote: Patient is calling in to reschedule her AWV on 09/14/23. First Available is November, can patient be scheduled sooner?

## 2023-08-29 ENCOUNTER — Other Ambulatory Visit: Payer: Self-pay | Admitting: Family Medicine

## 2023-08-29 DIAGNOSIS — G43809 Other migraine, not intractable, without status migrainosus: Secondary | ICD-10-CM

## 2023-08-31 ENCOUNTER — Encounter: Payer: Self-pay | Admitting: *Deleted

## 2023-08-31 ENCOUNTER — Encounter: Payer: Self-pay | Admitting: Internal Medicine

## 2023-08-31 NOTE — Telephone Encounter (Signed)
 Requested medication (s) are due for refill today: yes  Requested medication (s) are on the active medication list: yes  Last refill:  03/24/23  Future visit scheduled: yes  Notes to clinic:  Unable to refill per protocol, cannot delegate.      Requested Prescriptions  Pending Prescriptions Disp Refills   butalbital -acetaminophen -caffeine  (FIORICET) 50-325-40 MG tablet [Pharmacy Med Name: BUTALBITAL -APAP-CAFFEINE  50-325-40] 60 tablet     Sig: TAKE 1 TO 2 TABLETS BY MOUTH 2 TIMES DAILY AS NEEDED FOR HEADACHE     Not Delegated - Analgesics:  Non-Opioid Analgesic Combinations 2 Failed - 08/31/2023 10:43 AM      Failed - This refill cannot be delegated      Passed - Cr in normal range and within 360 days    Creatinine  Date Value Ref Range Status  08/17/2023 0.92 0.44 - 1.00 mg/dL Final  16/12/9602 0.9 0.6 - 1.1 mg/dL Final         Passed - eGFR is 10 or above and within 360 days    EGFR (African American)  Date Value Ref Range Status  06/22/2013 >60  Final   GFR calc Af Amer  Date Value Ref Range Status  02/17/2020 93 >59 mL/min/1.73 Final    Comment:    **In accordance with recommendations from the NKF-ASN Task force,**   [facility] is in the process of updating its eGFR calculation to the   2021 CKD-EPI creatinine equation that estimates kidney function   without a race variable.    EGFR (Non-African Amer.)  Date Value Ref Range Status  06/22/2013 >60  Final    Comment:    eGFR values <50mL/min/1.73 m2 may be an indication of chronic kidney disease (CKD). Calculated eGFR is useful in patients with stable renal function. The eGFR calculation will not be reliable in acutely ill patients when serum creatinine is changing rapidly. It is not useful in  patients on dialysis. The eGFR calculation may not be applicable to patients at the low and high extremes of body sizes, pregnant women, and vegetarians. LABS - This specimen was collected through an   - indwelling catheter  or arterial line.  - A minimum of of blood was wasted prior    - to collecting the sample.  Interpret  - results with caution.    GFR, Estimated  Date Value Ref Range Status  08/17/2023 >60 >60 mL/min Final    Comment:    (NOTE) Calculated using the CKD-EPI Creatinine Equation (2021)    eGFR  Date Value Ref Range Status  07/15/2023 70 >59 mL/min/1.73 Final         Passed - Patient is not pregnant      Passed - Valid encounter within last 12 months    Recent Outpatient Visits           2 months ago Annual physical exam   [facility] [place], Asencion Blacksmith, DO   3 months ago Pruritus   [facility] [address] [place], [place], PA-C       Future Appointments             In 1 week Dunn, Elvia Hammans, PA-C [facility] [place]   In 9 months Pardue, Asencion Blacksmith, DO [facility]

## 2023-09-02 ENCOUNTER — Other Ambulatory Visit: Payer: Self-pay | Admitting: Family Medicine

## 2023-09-02 DIAGNOSIS — F419 Anxiety disorder, unspecified: Secondary | ICD-10-CM

## 2023-09-05 NOTE — Progress Notes (Deleted)
 Cardiology Office Note    Date:  09/05/2023   ID:  Sabrina Morris, DOB [birth year], MRN [id number]  PCP:  Donzella Lauraine SAILOR, DO  Cardiologist:  Evalene Lunger, MD  Electrophysiologist:  None   Chief Complaint: Follow-up  History of Present Illness:   Sabrina Morris is a 58 y.o. female with history of WPW status post ablation in 1994, breast cancer status post mastectomy in 2012 followed by chemoradiation, lymphedema, iron  deficiency anemia, and chronic fatigue who presents for follow-up of ***  Prior stress echo in 2016 through [facility] cardiology showed a resting EF of 45% with a stress EF greater than 55% and was otherwise reported as normal.  She was evaluated by Dr. Gollan in 2018 for lymphedema and at that time was taking Lasix  20 mg daily.  Echo in 02/2017 showed an EF of 60 to 65%, mild LVH, grade 1 diastolic dysfunction, and normal RV systolic function.  Bilateral extremity ultrasound was negative for DVT.  She followed up with Dr. Gollan in 05/2023 with noted weight gain with transition from furosemide  to torsemide  20 mg twice daily.  ***   Labs independently reviewed: 08/2023 - potassium 3.8, BUN 19, serum creatinine 0.92, albumin 4.3, AST/ALT normal, Hgb 15.7, PLT 274 06/2023 - TSH normal 04/2023 - TC 210, TG 238, HDL 45, LDL 123, A1c 6.3  Past Medical History:  Diagnosis Date   Adrenal disorder (HCC)    Anemia 11/2018   Breast cancer (HCC)    H/O bone density study 2015   H/O colonoscopy    sch 06/12/15   H/O colonoscopy 11/2018   H/O endoscopy 11/2018   upper   Lymphedema    Lymphedema    Migraine    Pap smear for cervical cancer screening [id number]   Postmenopausal 07/02/2015    Past Surgical History:  Procedure Laterality Date   breast  Left 02/18/2017   Implant removed   BREAST ENHANCEMENT SURGERY Bilateral 2005   CARDIAC CATHETERIZATION  1994   CARDIAC CATHETERIZATION  1994   MASTECTOMY Right 02/2011   paraesophageal hernia  2021   PORT A CATH INJECTION ([facility]  HX)  2013    Current Medications: No outpatient medications have been marked as taking for the 09/07/23 encounter (Appointment) with Abigail Bernardino HERO, PA-C.    Allergies:   Sumatriptan, Feraheme  [ferumoxytol ], Haemophilus influenzae vaccines, Other, Sulfa antibiotics, Sulfamethoxazole, Tamoxifen, Zoladex  [goserelin], Codeine, Hydrocodone-acetaminophen , and Topiramate    Social History   Socioeconomic History   Marital status: Divorced    Spouse name: Not on file   Number of children: 0   Years of education: Not on file   Highest education level: Associate degree: academic program  Occupational History   Occupation: Charity fundraiser    Comment: part time   Occupation: disability  Tobacco Use   Smoking status: Never   Smokeless tobacco: Never  Vaping Use   Vaping status: Never Used  Substance and Sexual Activity   Alcohol use: No   Drug use: No   Sexual activity: Not Currently    Birth control/protection: Post-menopausal  Other Topics Concern   Not on file  Social History Narrative   Disability [sec to Lymphedema]; 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  No smoking or alcohol. Lives at home/with sister.    Social Drivers of Health   Financial Resource Strain: High Risk (05/13/2023)   Overall Financial Resource Strain (CARDIA)    Difficulty of Paying Living Expenses: Hard  Food  Insecurity: Food Insecurity Present (05/13/2023)   Hunger Vital Sign    Worried About Running Out of Food in the Last Year: Sometimes true    Ran Out of Food in the Last Year: Never true  Transportation Needs: No Transportation Needs (05/13/2023)   PRAPARE - Administrator, Civil Service (Medical): No    Lack of Transportation (Non-Medical): No  Physical Activity: Insufficiently Active (05/13/2023)   Exercise Vital Sign    Days of Exercise per Week: 1 day    Minutes of Exercise per Session: 10 min  Stress: No Stress Concern Present (05/13/2023)   [employer] of Occupational Health -  Occupational Stress Questionnaire    Feeling of Stress : Only a little  Social Connections: Unknown (05/13/2023)   Social Connection and Isolation Panel    Frequency of Communication with Friends and Family: Patient declined    Frequency of Social Gatherings with Friends and Family: Patient declined    Attends Religious Services: 1 to 4 times per year    Active Member of [employer] or Organizations: No    Attends Engineer, structural: Not on file    Marital Status: Divorced     Family History:  The patient's family history includes Breast cancer (age of onset: 65) in her sister; Thyroid  disease in her sister. She was adopted.  ROS:   12-point review of systems is negative unless otherwise noted in the HPI.   EKGs/Labs/Other Studies Reviewed:    Studies reviewed were summarized above. The additional studies were reviewed today:  2D echo 03/05/2017: - Left ventricle: The cavity size was normal. Wall thickness was    increased in a pattern of mild LVH. Systolic function was normal.    The estimated ejection fraction was in the range of 60% to 65%.    Doppler parameters are consistent with abnormal left ventricular    relaxation (grade 1 diastolic dysfunction).  - Right ventricle: The cavity size was normal. Wall thickness was    normal. Systolic function was normal.    EKG:  EKG is ordered today.  The EKG ordered today demonstrates ***  Recent Labs: 07/15/2023: TSH 2.760 08/17/2023: ALT 24; BUN 19; Creatinine 0.92; Hemoglobin 15.7; Platelet Count 274; Potassium 3.8; Sodium 137  Recent Lipid Panel    Component Value Date/Time   CHOL 210 (H) 05/14/2023 0940   CHOL 168 06/04/2012 0809   TRIG 238 (H) 05/14/2023 0940   TRIG 71 06/04/2012 0809   HDL 45 05/14/2023 0940   HDL 51 06/04/2012 0809   CHOLHDL 4.7 (H) 05/14/2023 0940   VLDL 14 06/04/2012 0809   LDLCALC 123 (H) 05/14/2023 0940   LDLCALC 103 (H) 06/04/2012 0809    PHYSICAL EXAM:    VS:  There were no vitals taken  for this visit.  BMI: There is no height or weight on file to calculate BMI.  Physical Exam  Wt Readings from Last 3 Encounters:  08/17/23 227 lb 9.6 oz (103.2 kg)  06/15/23 225 lb 3.2 oz (102.2 kg)  06/08/23 229 lb (103.9 kg)     ASSESSMENT & PLAN:   Lymphedema:  WPW: Status post catheter ablation in 1994.  ***   {Are you ordering a CV Procedure (e.g. stress test, cath, DCCV, TEE, etc)?   Press F2        :[id number]}     Disposition: F/u with Dr. Gollan or an APP in ***.   Medication Adjustments/Labs and Tests Ordered: Current medicines are reviewed at length  with the patient today.  Concerns regarding medicines are outlined above. Medication changes, Labs and Tests ordered today are summarized above and listed in the Patient Instructions accessible in Encounters.   Signed, Bernardino Bring, PA-C 09/05/2023 8:16 PM     [facility] [place] [address] [place], [place] [address] [phone]

## 2023-09-07 ENCOUNTER — Ambulatory Visit: Admitting: Physician Assistant

## 2023-09-09 DIAGNOSIS — K080 Exfoliation of teeth due to systemic causes: Secondary | ICD-10-CM | POA: Diagnosis not present

## 2023-09-10 ENCOUNTER — Telehealth: Payer: Self-pay

## 2023-09-10 NOTE — Telephone Encounter (Signed)
 Copied from CRM [id number]. Topic: Appointments - Scheduling Inquiry for Clinic >> Sep 10, 2023  4:34 PM Delon HERO wrote: Reason for CRM: Patient is calling to schedule AWV.

## 2023-09-14 ENCOUNTER — Encounter: Admitting: Family Medicine

## 2023-09-14 DIAGNOSIS — Z780 Asymptomatic menopausal state: Secondary | ICD-10-CM | POA: Diagnosis not present

## 2023-09-14 DIAGNOSIS — I972 Postmastectomy lymphedema syndrome: Secondary | ICD-10-CM | POA: Diagnosis not present

## 2023-09-15 DIAGNOSIS — R29898 Other symptoms and signs involving the musculoskeletal system: Secondary | ICD-10-CM | POA: Diagnosis not present

## 2023-09-15 DIAGNOSIS — I890 Lymphedema, not elsewhere classified: Secondary | ICD-10-CM | POA: Diagnosis not present

## 2023-09-15 DIAGNOSIS — C50511 Malignant neoplasm of lower-outer quadrant of right female breast: Secondary | ICD-10-CM | POA: Diagnosis not present

## 2023-09-21 NOTE — Progress Notes (Unsigned)
 Cardiology Office Note    Date:  09/22/2023   ID:  Sabrina Morris, DOB [birth year], MRN [id number]  PCP:  Donzella Lauraine SAILOR, DO  Cardiologist:  Evalene Lunger, MD  Electrophysiologist:  None   Chief Complaint: Follow-up  History of Present Illness:   Sabrina Morris is a 58 y.o. female with history of WPW status post ablation in 1994, breast cancer status post mastectomy in 2012 followed by chemoradiation complicated by progressive lymphedema, iron  deficiency anemia, and chronic fatigue who presents for follow-up of lymphedema.  Prior stress echo in 2016 through [facility] cardiology showed a resting EF of 45% with a stress EF greater than 55% and was otherwise reported as normal.  She was evaluated by Dr. Gollan in 2018 for lymphedema and at that time was taking Lasix  20 mg daily.  Echo in 02/2017 showed an EF of 60 to 65%, mild LVH, grade 1 diastolic dysfunction, and normal RV systolic function.  Bilateral extremity ultrasound was negative for DVT.  She followed up with Dr. Gollan in 05/2023 with noted weight gain with transition from furosemide  to torsemide  20 mg twice daily.  She reports a longstanding history of total body lymphedema as her diagnosis and treatment.  This was previously well-controlled with lymphedema pumps along the upper and lower extremities.  However, she reports the company changed pumps and pumping sequence and with this she has noted progressive total body lymphedema.  She also reports some exertional shortness of breath without frank chest pain.  She notes an increase in thirst, particularly craving cold water following transition from furosemide  to torsemide .  She has also noted an increase in cramping and has taken an extra potassium intermittently with improvement.  She is without progressive orthopnea.  She does monitor her sodium intake and limits this.  When compared to her visit in 05/2023, her weight is down 1 pound.  She is currently followed by lymphedema  clinic.   Labs independently reviewed: 08/2023 - potassium 3.8, BUN 19, serum creatinine 0.92, albumin 4.3, AST/ALT normal, Hgb 15.7, PLT 274 06/2023 - TSH normal 04/2023 - TC 210, TG 238, HDL 45, LDL 123, A1c 6.3  Past Medical History:  Diagnosis Date   Adrenal disorder (HCC)    Anemia 11/2018   Breast cancer (HCC)    H/O bone density study 2015   H/O colonoscopy    sch 06/12/15   H/O colonoscopy 11/2018   H/O endoscopy 11/2018   upper   Lymphedema    Lymphedema    Migraine    Pap smear for cervical cancer screening [id number]   Postmenopausal 07/02/2015    Past Surgical History:  Procedure Laterality Date   breast  Left 02/18/2017   Implant removed   BREAST ENHANCEMENT SURGERY Bilateral 2005   CARDIAC CATHETERIZATION  1994   CARDIAC CATHETERIZATION  1994   MASTECTOMY Right 02/2011   paraesophageal hernia  2021   PORT A CATH INJECTION ([facility] HX)  2013    Current Medications: Current Meds  Medication Sig   butalbital -acetaminophen -caffeine  (FIORICET) 50-325-40 MG tablet TAKE 1 TO 2 TABLETS BY MOUTH 2 TIMES DAILY AS NEEDED FOR HEADACHE   calcium carbonate 100 mg/ml SUSP Take by mouth.   cetirizine  (ZYRTEC ) 10 MG tablet Take 1 tablet (10 mg total) by mouth daily.   Cholecalciferol (VITAMIN D3) 5000 units TABS Take by mouth daily at 6 (six) AM.   cyclobenzaprine  (FLEXERIL ) 5 MG tablet TAKE 1 TABLET BY MOUTH 3 TIMES A DAY AS NEEDED  fluocinonide  (LIDEX ) 0.05 % external solution Apply 1 application topically 2 (two) times daily.   fluticasone -salmeterol (WIXELA INHUB) 250-50 MCG/ACT AEPB Inhale 1 puff into the lungs in the morning and at bedtime.   LORazepam  (ATIVAN ) 1 MG tablet TAKE 1/2 TABLET BY MOUTH 2 TIMES DAILY AS NEEDED FOR ANXIETY   meloxicam  (MOBIC ) 15 MG tablet TAKE 1 TABLET BY MOUTH DAILY   Multiple Vitamin (MULTIVITAMIN) capsule Take 1 capsule by mouth daily.   nystatin  ointment (MYCOSTATIN ) Apply 1 application topically 2 (two) times daily. At mastectomy site as  needed   omeprazole (PRILOSEC) 40 MG capsule Take 1 capsule by mouth daily.   oxyCODONE -acetaminophen  (PERCOCET/ROXICET) 5-325 MG tablet Take 1 tablet by mouth every 8 (eight) hours as needed for severe pain (pain score 7-10).   oxyCODONE -acetaminophen  (PERCOCET/ROXICET) 5-325 MG tablet Take 1 tablet by mouth every 8 (eight) hours as needed for severe pain (pain score 7-10).   phentermine  37.5 MG capsule Take 1 capsule (37.5 mg total) by mouth every morning.   potassium chloride  (KLOR-CON ) 10 MEQ tablet Take 1 tablet (10 mEq total) by mouth 2 (two) times daily. Take with each torsemide    promethazine  (PHENERGAN ) 12.5 MG tablet TAKE 1 TABLET BY MOUTH EVERY 8 HOURS AS NEEDED FOR NAUSEA OR VOMITING   rizatriptan  (MAXALT ) 10 MG tablet TAKE 1 TABLET BY MOUTH AT FIRST SIGN OF MIGRAINE SYMPTOMS. IF NO RELIEF, A SECOND TABLET MAY BE TAKEN IN 2 HOURS. LIMIT: 2 TABLETS PER DAY.   torsemide  (DEMADEX ) 20 MG tablet Take 1 tablet (20 mg total) by mouth 2 (two) times daily.   valACYclovir  (VALTREX ) 500 MG tablet TAKE 1 TABLET BY MOUTH 2 TIMES DAILY AS NEEDED FOR FEVER BLISTERS   VENTOLIN  HFA 108 (90 Base) MCG/ACT inhaler INHALE 2 PUFFS BY MOUTH EVERY 4 TO 6 HOURS AS NEEDED    Allergies:   Sumatriptan, Feraheme  [ferumoxytol ], Haemophilus influenzae vaccines, Other, Sulfa antibiotics, Sulfamethoxazole, Tamoxifen, Zoladex  [goserelin], Codeine, Hydrocodone-acetaminophen , and Topiramate    Social History   Socioeconomic History   Marital status: Divorced    Spouse name: Not on file   Number of children: 0   Years of education: Not on file   Highest education level: Associate degree: academic program  Occupational History   Occupation: Charity fundraiser    Comment: part time   Occupation: disability  Tobacco Use   Smoking status: Never   Smokeless tobacco: Never  Vaping Use   Vaping status: Never Used  Substance and Sexual Activity   Alcohol use: No   Drug use: No   Sexual activity: Not Currently    Birth  control/protection: Post-menopausal  Other Topics Concern   Not on file  Social History Narrative   Disability [sec to Lymphedema]; 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  No smoking or alcohol. Lives at home/with sister.    Social Drivers of Health   Financial Resource Strain: High Risk (05/13/2023)   Overall Financial Resource Strain (CARDIA)    Difficulty of Paying Living Expenses: Hard  Food Insecurity: Food Insecurity Present (05/13/2023)   Hunger Vital Sign    Worried About Running Out of Food in the Last Year: Sometimes true    Ran Out of Food in the Last Year: Never true  Transportation Needs: No Transportation Needs (05/13/2023)   PRAPARE - Administrator, Civil Service (Medical): No    Lack of Transportation (Non-Medical): No  Physical Activity: Insufficiently Active (05/13/2023)   Exercise Vital Sign  Days of Exercise per Week: 1 day    Minutes of Exercise per Session: 10 min  Stress: No Stress Concern Present (05/13/2023)   [employer] of Occupational Health - Occupational Stress Questionnaire    Feeling of Stress : Only a little  Social Connections: Unknown (05/13/2023)   Social Connection and Isolation Panel    Frequency of Communication with Friends and Family: Patient declined    Frequency of Social Gatherings with Friends and Family: Patient declined    Attends Religious Services: 1 to 4 times per year    Active Member of [employer] or Organizations: No    Attends Engineer, structural: Not on file    Marital Status: Divorced     Family History:  The patient's family history includes Breast cancer (age of onset: 66) in her sister; Thyroid  disease in her sister. She was adopted.  ROS:   12-point review of systems is negative unless otherwise noted in the HPI.   EKGs/Labs/Other Studies Reviewed:    Studies reviewed were summarized above. The additional studies were reviewed today:  2D echo 03/05/2017: - Left ventricle:  The cavity size was normal. Wall thickness was    increased in a pattern of mild LVH. Systolic function was normal.    The estimated ejection fraction was in the range of 60% to 65%.    Doppler parameters are consistent with abnormal left ventricular    relaxation (grade 1 diastolic dysfunction).  - Right ventricle: The cavity size was normal. Wall thickness was    normal. Systolic function was normal.    EKG:  EKG is ordered today.  The EKG ordered today demonstrates sinus tachycardia, 101 bpm, no acute ST-T changes  Recent Labs: 07/15/2023: TSH 2.760 08/17/2023: ALT 24; BUN 19; Creatinine 0.92; Hemoglobin 15.7; Platelet Count 274; Potassium 3.8; Sodium 137  Recent Lipid Panel    Component Value Date/Time   CHOL 210 (H) 05/14/2023 0940   CHOL 168 06/04/2012 0809   TRIG 238 (H) 05/14/2023 0940   TRIG 71 06/04/2012 0809   HDL 45 05/14/2023 0940   HDL 51 06/04/2012 0809   CHOLHDL 4.7 (H) 05/14/2023 0940   VLDL 14 06/04/2012 0809   LDLCALC 123 (H) 05/14/2023 0940   LDLCALC 103 (H) 06/04/2012 0809    PHYSICAL EXAM:    VS:  BP (!) 140/95 (BP Location: Left Arm, Patient Position: Sitting, Cuff Size: Normal)   Pulse (!) 101   Ht 5' 5 (1.651 m)   Wt 228 lb 6.4 oz (103.6 kg)   SpO2 99%   BMI 38.01 kg/m   BMI: Body mass index is 38.01 kg/m.  Physical Exam Vitals reviewed.  Constitutional:      Appearance: She is well-developed.  HENT:     Head: Normocephalic and atraumatic.  Eyes:     General:        Right eye: No discharge.        Left eye: No discharge.  Cardiovascular:     Rate and Rhythm: Normal rate and regular rhythm.     Heart sounds: Normal heart sounds, S1 normal and S2 normal. Heart sounds not distant. No midsystolic click and no opening snap. No murmur heard.    No friction rub.  Pulmonary:     Effort: Pulmonary effort is normal. No respiratory distress.     Breath sounds: Normal breath sounds. No decreased breath sounds, wheezing, rhonchi or rales.  Chest:      Chest wall: No tenderness.  Musculoskeletal:  Cervical back: Normal range of motion.  Skin:    General: Skin is warm and dry.     Nails: There is no clubbing.  Neurological:     Mental Status: She is alert and oriented to person, place, and time.  Psychiatric:        Speech: Speech normal.        Behavior: Behavior normal.        Thought Content: Thought content normal.        Judgment: Judgment normal.     Wt Readings from Last 3 Encounters:  09/22/23 228 lb 6.4 oz (103.6 kg)  08/17/23 227 lb 9.6 oz (103.2 kg)  06/15/23 225 lb 3.2 oz (102.2 kg)     ASSESSMENT & PLAN:   Dyspnea: Obtain BNP, CMP, and echo.  Remains on torsemide  20 mg daily with KCl repletion.  Based on echo findings, may need to consider ischemic testing as well.  Lymphedema: Presumed to be in the setting of cancer and underlying therapy.  Followed by lymphedema clinic with lymphedema pump.  We will obtain echo to ensure no significant cardiomyopathy as outlined above.  WPW: Status post catheter ablation in 1994.      Disposition: F/u with Dr. Gollan or an APP in 6 months.   Medication Adjustments/Labs and Tests Ordered: Current medicines are reviewed at length with the patient today.  Concerns regarding medicines are outlined above. Medication changes, Labs and Tests ordered today are summarized above and listed in the Patient Instructions accessible in Encounters.   Signed, Bernardino Bring, PA-C 09/22/2023 5:11 PM     [facility] [place] [address] [place], [place] [address] [phone]

## 2023-09-22 ENCOUNTER — Ambulatory Visit: Attending: Physician Assistant | Admitting: Physician Assistant

## 2023-09-22 ENCOUNTER — Encounter: Payer: Self-pay | Admitting: Physician Assistant

## 2023-09-22 VITALS — BP 140/95 | HR 101 | Ht 65.0 in | Wt 228.4 lb

## 2023-09-22 DIAGNOSIS — R0602 Shortness of breath: Secondary | ICD-10-CM | POA: Diagnosis not present

## 2023-09-22 DIAGNOSIS — I456 Pre-excitation syndrome: Secondary | ICD-10-CM | POA: Diagnosis not present

## 2023-09-22 DIAGNOSIS — Z79899 Other long term (current) drug therapy: Secondary | ICD-10-CM

## 2023-09-22 DIAGNOSIS — I890 Lymphedema, not elsewhere classified: Secondary | ICD-10-CM

## 2023-09-22 NOTE — Patient Instructions (Signed)
 Medication Instructions:  Your physician recommends that you continue on your current medications as directed. Please refer to the Current Medication list given to you today.   *If you need a refill on your cardiac medications before your next appointment, please call your pharmacy*  Lab Work: Your provider would like for you to have following labs drawn today CMeT and BNP.   If you have labs (blood work) drawn today and your tests are completely normal, you will receive your results only by: MyChart Message (if you have MyChart) OR A paper copy in the mail If you have any lab test that is abnormal or we need to change your treatment, we will call you to review the results.  Testing/Procedures: Your physician has requested that you have an echocardiogram. Echocardiography is a painless test that uses sound waves to create images of your heart. It provides your doctor with information about the size and shape of your heart and how well your heart's chambers and valves are working.   You may receive an ultrasound enhancing agent through an IV if needed to better visualize your heart during the echo. This procedure takes approximately one hour.  There are no restrictions for this procedure.  This will take place at [address] [facility]) [address], [place] [address]  Please note: We ask at that you not bring children with you during ultrasound (echo/ vascular) testing. Due to room size and safety concerns, children are not allowed in the ultrasound rooms during exams. Our front office staff cannot provide observation of children in our lobby area while testing is being conducted. An adult accompanying a patient to their appointment will only be allowed in the ultrasound room at the discretion of the ultrasound technician under special circumstances. We apologize for any inconvenience.  Follow-Up: At [facility], you and your health needs are our priority.  As part of our  continuing mission to provide you with exceptional heart care, our providers are all part of one team.  This team includes your primary Cardiologist (physician) and Advanced Practice Providers or APPs (Physician Assistants and Nurse Practitioners) who all work together to provide you with the care you need, when you need it.  Your next appointment:   6 month(s)  Provider:   Timothy Gollan, MD

## 2023-09-23 LAB — COMPREHENSIVE METABOLIC PANEL WITH GFR
ALT: 26 IU/L (ref 0–32)
AST: 21 IU/L (ref 0–40)
Albumin: 4.5 g/dL (ref 3.8–4.9)
Alkaline Phosphatase: 116 IU/L (ref 44–121)
BUN/Creatinine Ratio: 19 (ref 9–23)
BUN: 17 mg/dL (ref 6–24)
Bilirubin Total: 0.2 mg/dL (ref 0.0–1.2)
CO2: 19 mmol/L — ABNORMAL LOW (ref 20–29)
Calcium: 9.2 mg/dL (ref 8.7–10.2)
Chloride: 103 mmol/L (ref 96–106)
Creatinine, Ser: 0.91 mg/dL (ref 0.57–1.00)
Globulin, Total: 2.3 g/dL (ref 1.5–4.5)
Glucose: 105 mg/dL — ABNORMAL HIGH (ref 70–99)
Potassium: 4.2 mmol/L (ref 3.5–5.2)
Sodium: 142 mmol/L (ref 134–144)
Total Protein: 6.8 g/dL (ref 6.0–8.5)
eGFR: 73 mL/min/1.73 (ref >59)

## 2023-09-23 LAB — BRAIN NATRIURETIC PEPTIDE: BNP: 14 pg/mL (ref 0.0–100.0)

## 2023-09-25 ENCOUNTER — Ambulatory Visit: Payer: Self-pay | Admitting: Physician Assistant

## 2023-09-28 ENCOUNTER — Encounter: Admitting: Family Medicine

## 2023-10-02 ENCOUNTER — Encounter: Payer: Self-pay | Admitting: Internal Medicine

## 2023-10-02 ENCOUNTER — Other Ambulatory Visit: Payer: Self-pay

## 2023-10-02 DIAGNOSIS — D500 Iron deficiency anemia secondary to blood loss (chronic): Secondary | ICD-10-CM

## 2023-10-08 DIAGNOSIS — I890 Lymphedema, not elsewhere classified: Secondary | ICD-10-CM | POA: Diagnosis not present

## 2023-10-08 DIAGNOSIS — R29898 Other symptoms and signs involving the musculoskeletal system: Secondary | ICD-10-CM | POA: Diagnosis not present

## 2023-10-08 DIAGNOSIS — C50511 Malignant neoplasm of lower-outer quadrant of right female breast: Secondary | ICD-10-CM | POA: Diagnosis not present

## 2023-10-15 ENCOUNTER — Ambulatory Visit

## 2023-10-16 ENCOUNTER — Other Ambulatory Visit: Payer: Self-pay | Admitting: Family Medicine

## 2023-10-16 ENCOUNTER — Ambulatory Visit: Payer: Self-pay

## 2023-10-16 NOTE — Telephone Encounter (Signed)
 FYI Only or Action Required?: FYI only for provider.  Patient was last seen in primary care on 07/03/2023 by Blair, Diane W, FNP.  Called Nurse Triage reporting Medication Problem and Back Pain.  Symptoms began several days ago.  Interventions attempted: Prescription medications: Meloxicam .  Symptoms are: stable.  Triage Disposition: Call PCP When Office is Open  Patient/caregiver understands and will follow disposition?: Yes FYI: Patient requesting to be restarted back on Ibuprofen  600mg  so she can continue to alternate with her Meloxicam   Copied from CRM #[id number]. Topic: Clinical - Prescription Issue >> Oct 16, 2023  2:36 PM Avram MATSU wrote: Reason for CRM: pt is requesting to get a refill for ibuprofen  for pain she stated she was taking meloxicam  before but it does not work for her. Reason for Disposition  Caller wants to use a complementary or alternative medicine  Answer Assessment - Initial Assessment Questions 1. NAME of MEDICINE: What medicine(s) are you calling about?     Ibuprofen   2. QUESTION: What is your question? (e.g., double dose of medicine, side effect)     Patient asking if she can get restarted back on Ibuprofen  600mg   3. PRESCRIBER: Who prescribed the medicine? Reason: if prescribed by specialist, call should be referred to that group.     Dr. Charlie Forte  4. SYMPTOMS: Do you have any symptoms? If Yes, ask: What symptoms are you having?  How bad are the symptoms (e.g., mild, moderate, severe)     Mild to moderate pain  5. PREGNANCY:  Is there any chance that you are pregnant? When was your last menstrual period?     No  Patient says that she was alternating Ibuprofen  and meloxicam  and had great relief. Has now run out of Ibuprofen  and wanting to get restarted for her chronic back pain and lymphedema  Protocols used: Medication Question Call-A-AH

## 2023-10-16 NOTE — Telephone Encounter (Signed)
 Patient has an appointment on 10/19/2023 @ 9:00 am with Janna Ostwalt.

## 2023-10-19 ENCOUNTER — Ambulatory Visit: Admitting: Physician Assistant

## 2023-10-19 NOTE — Telephone Encounter (Signed)
 Requested medication (s) are due for refill today:   Requested medication (s) are on the active medication list: No  Last refill:    Future visit scheduled: Yes  Notes to clinic:  Not on medication list.    Requested Prescriptions  Pending Prescriptions Disp Refills   ibuprofen  (ADVIL ) 600 MG tablet [Pharmacy Med Name: IBUPROFEN  600 MG TAB] 360 tablet     Sig: TAKE 1 TABLET (600 MG TOTAL) BY MOUTH EVERY 8 HOURS AS NEEDED     Analgesics:  NSAIDS Failed - 10/19/2023  7:56 AM      Failed - Manual Review: Labs are only required if the patient has taken medication for more than 8 weeks.      Failed - HGB in normal range and within 360 days    Hemoglobin  Date Value Ref Range Status  08/17/2023 15.7 (H) 12.0 - 15.0 g/dL Final  97/72/7974 84.3 11.1 - 15.9 g/dL Final   HGB  Date Value Ref Range Status  10/16/2015 14.7 11.6 - 15.9 g/dL Final         Failed - HCT in normal range and within 360 days    HCT  Date Value Ref Range Status  08/17/2023 46.9 (H) 36.0 - 46.0 % Final  10/16/2015 45.8 34.8 - 46.6 % Final   Hematocrit  Date Value Ref Range Status  05/14/2023 47.7 (H) 34.0 - 46.6 % Final         Passed - Cr in normal range and within 360 days    Creatinine  Date Value Ref Range Status  08/17/2023 0.92 0.44 - 1.00 mg/dL Final  91/98/7982 0.9 0.6 - 1.1 mg/dL Final   Creatinine, Ser  Date Value Ref Range Status  09/22/2023 0.91 0.57 - 1.00 mg/dL Final         Passed - PLT in normal range and within 360 days    Platelets  Date Value Ref Range Status  05/14/2023 265 150 - 450 x10E3/uL Final   Platelet Count  Date Value Ref Range Status  08/17/2023 274 150 - 400 K/uL Final         Passed - eGFR is 30 or above and within 360 days    EGFR (African American)  Date Value Ref Range Status  06/22/2013 >60  Final   GFR calc Af Amer  Date Value Ref Range Status  02/17/2020 93 >59 mL/min/1.73 Final    Comment:    **In accordance with recommendations from the NKF-ASN  Task force,**   [facility] is in the process of updating its eGFR calculation to the   2021 CKD-EPI creatinine equation that estimates kidney function   without a race variable.    EGFR (Non-African Amer.)  Date Value Ref Range Status  06/22/2013 >60  Final    Comment:    eGFR values <57mL/min/1.73 m2 may be an indication of chronic kidney disease (CKD). Calculated eGFR is useful in patients with stable renal function. The eGFR calculation will not be reliable in acutely ill patients when serum creatinine is changing rapidly. It is not useful in  patients on dialysis. The eGFR calculation may not be applicable to patients at the low and high extremes of body sizes, pregnant women, and vegetarians. LABS - This specimen was collected through an   - indwelling catheter or arterial line.  - A minimum of of blood was wasted prior    - to collecting the sample.  Interpret  - results with caution.    GFR,  Estimated  Date Value Ref Range Status  08/17/2023 >60 >60 mL/min Final    Comment:    (NOTE) Calculated using the CKD-EPI Creatinine Equation (2021)    eGFR  Date Value Ref Range Status  09/22/2023 73 >59 mL/min/1.73 Final         Passed - Patient is not pregnant      Passed - Valid encounter within last 12 months    Recent Outpatient Visits           4 months ago Annual physical exam   [facility] [place], Lauraine SAILOR, DO   5 months ago Pruritus   [place] [facility] [place], [place], PA-C       Future Appointments             In 8 months P[place], Lauraine SAILOR, DO [place] [facility], [facility]

## 2023-10-21 ENCOUNTER — Other Ambulatory Visit: Payer: Self-pay | Admitting: Family Medicine

## 2023-10-21 DIAGNOSIS — G8929 Other chronic pain: Secondary | ICD-10-CM

## 2023-10-21 DIAGNOSIS — G43809 Other migraine, not intractable, without status migrainosus: Secondary | ICD-10-CM

## 2023-10-21 DIAGNOSIS — I890 Lymphedema, not elsewhere classified: Secondary | ICD-10-CM

## 2023-10-21 MED ORDER — IBUPROFEN 600 MG PO TABS: 600.0000 mg | ORAL_TABLET | Freq: Three times a day (TID) | ORAL | 0 refills | Status: DC | PRN

## 2023-10-23 NOTE — Telephone Encounter (Signed)
 Called to scheduled patient for AWV. No answer. Left direct dial for Darice Brasil who is the new careguide for this office. Jennett ORN , CMA

## 2023-10-27 ENCOUNTER — Encounter: Payer: Self-pay | Admitting: Internal Medicine

## 2023-11-27 DIAGNOSIS — Z780 Asymptomatic menopausal state: Secondary | ICD-10-CM | POA: Diagnosis not present

## 2023-11-27 DIAGNOSIS — R5383 Other fatigue: Secondary | ICD-10-CM | POA: Diagnosis not present

## 2023-12-02 ENCOUNTER — Ambulatory Visit: Attending: Physician Assistant

## 2023-12-02 DIAGNOSIS — R0602 Shortness of breath: Secondary | ICD-10-CM

## 2023-12-02 LAB — ECHOCARDIOGRAM COMPLETE
AR max vel: 2.58 cm2
AV Area VTI: 2.67 cm2
AV Area mean vel: 2.63 cm2
AV Mean grad: 3 mmHg
AV Peak grad: 4.8 mmHg
Ao pk vel: 1.1 m/s
Area-P 1/2: 5.13 cm2
S' Lateral: 3.3 cm

## 2023-12-08 ENCOUNTER — Encounter: Payer: Self-pay | Admitting: Family Medicine

## 2023-12-08 DIAGNOSIS — Z79899 Other long term (current) drug therapy: Secondary | ICD-10-CM

## 2023-12-08 DIAGNOSIS — R252 Cramp and spasm: Secondary | ICD-10-CM

## 2023-12-08 DIAGNOSIS — T502X5A Adverse effect of carbonic-anhydrase inhibitors, benzothiadiazides and other diuretics, initial encounter: Secondary | ICD-10-CM

## 2023-12-08 DIAGNOSIS — G43809 Other migraine, not intractable, without status migrainosus: Secondary | ICD-10-CM

## 2023-12-08 DIAGNOSIS — K219 Gastro-esophageal reflux disease without esophagitis: Secondary | ICD-10-CM

## 2023-12-08 DIAGNOSIS — R7303 Prediabetes: Secondary | ICD-10-CM

## 2023-12-08 DIAGNOSIS — D500 Iron deficiency anemia secondary to blood loss (chronic): Secondary | ICD-10-CM

## 2023-12-08 DIAGNOSIS — E279 Disorder of adrenal gland, unspecified: Secondary | ICD-10-CM

## 2023-12-09 DIAGNOSIS — T502X5A Adverse effect of carbonic-anhydrase inhibitors, benzothiadiazides and other diuretics, initial encounter: Secondary | ICD-10-CM | POA: Diagnosis not present

## 2023-12-09 DIAGNOSIS — R7303 Prediabetes: Secondary | ICD-10-CM | POA: Diagnosis not present

## 2023-12-09 DIAGNOSIS — R252 Cramp and spasm: Secondary | ICD-10-CM | POA: Diagnosis not present

## 2023-12-09 DIAGNOSIS — Z79899 Other long term (current) drug therapy: Secondary | ICD-10-CM | POA: Diagnosis not present

## 2023-12-09 DIAGNOSIS — E279 Disorder of adrenal gland, unspecified: Secondary | ICD-10-CM | POA: Diagnosis not present

## 2023-12-09 DIAGNOSIS — K219 Gastro-esophageal reflux disease without esophagitis: Secondary | ICD-10-CM | POA: Diagnosis not present

## 2023-12-09 DIAGNOSIS — E876 Hypokalemia: Secondary | ICD-10-CM | POA: Diagnosis not present

## 2023-12-10 LAB — HEMOGLOBIN A1C
Est. average glucose Bld gHb Est-mCnc: 137 mg/dL
Hgb A1c MFr Bld: 6.4 % — ABNORMAL HIGH (ref 4.8–5.6)

## 2023-12-14 ENCOUNTER — Other Ambulatory Visit: Payer: Self-pay | Admitting: Family Medicine

## 2023-12-14 DIAGNOSIS — F419 Anxiety disorder, unspecified: Secondary | ICD-10-CM

## 2023-12-15 ENCOUNTER — Ambulatory Visit: Admitting: Family Medicine

## 2023-12-15 ENCOUNTER — Encounter: Payer: Self-pay | Admitting: Family Medicine

## 2023-12-15 VITALS — BP 129/86 | HR 107 | Ht 65.0 in | Wt 228.0 lb

## 2023-12-15 DIAGNOSIS — G8929 Other chronic pain: Secondary | ICD-10-CM

## 2023-12-15 DIAGNOSIS — G2581 Restless legs syndrome: Secondary | ICD-10-CM

## 2023-12-15 DIAGNOSIS — I972 Postmastectomy lymphedema syndrome: Secondary | ICD-10-CM | POA: Diagnosis not present

## 2023-12-15 DIAGNOSIS — S32050S Wedge compression fracture of fifth lumbar vertebra, sequela: Secondary | ICD-10-CM | POA: Diagnosis not present

## 2023-12-15 DIAGNOSIS — J301 Allergic rhinitis due to pollen: Secondary | ICD-10-CM

## 2023-12-15 DIAGNOSIS — G43809 Other migraine, not intractable, without status migrainosus: Secondary | ICD-10-CM

## 2023-12-15 DIAGNOSIS — R232 Flushing: Secondary | ICD-10-CM

## 2023-12-15 DIAGNOSIS — E611 Iron deficiency: Secondary | ICD-10-CM

## 2023-12-15 DIAGNOSIS — F419 Anxiety disorder, unspecified: Secondary | ICD-10-CM

## 2023-12-15 MED ORDER — IBUPROFEN 600 MG PO TABS: 600.0000 mg | ORAL_TABLET | Freq: Three times a day (TID) | ORAL | 0 refills | Status: AC | PRN

## 2023-12-15 MED ORDER — OXYCODONE-ACETAMINOPHEN 5-325 MG PO TABS
1.0000 | ORAL_TABLET | Freq: Three times a day (TID) | ORAL | 0 refills | Status: AC | PRN
Start: 2023-12-15 — End: ?

## 2023-12-15 MED ORDER — LORAZEPAM 1 MG PO TABS: ORAL_TABLET | ORAL | 2 refills | Status: AC

## 2023-12-15 MED ORDER — CELECOXIB 200 MG PO CAPS: 200.0000 mg | ORAL_CAPSULE | Freq: Two times a day (BID) | ORAL | 1 refills | Status: AC

## 2023-12-15 NOTE — Progress Notes (Signed)
 Established patient visit   Patient: Sabrina Morris   DOB: 10-19-1965   58 y.o. Female  MRN: [id number] Visit Date: 12/15/2023  Today's healthcare provider: LAURAINE LOISE BUOY, DO   Chief Complaint  Patient presents with   Pain    Patient takes chronic narcotic pain medication for lymphedema and migraines.  She states the medications control her pain well.  She states she also takes Meloxicam  and Ibuprofen  alternating for her lymphedema and back pain.   Subjective    HPI Sabrina Morris is a 58 year old female with full body lymphedema who presents for management of fluid retention and pain.  She experiences persistent fluid retention and pain due to her full body lymphedema. Despite a comprehensive cardiology workup, including a recent echocardiogram and normal BMP, she is not achieving the same level of fluid reduction as before. She was switched to torsemide  after previously being on furosemide . She is considering changing her lymphedema pump due to discomfort, as she feels the fluid is heavy and exacerbates her back pain. She will be contacting her insurance to get a case manager to help accomplish this.  She has significant lower back pain, attributed to both fluid retention and a previous L5 vertebra fracture. The pain is constant, and she manages it with ibuprofen , which she limits to once or twice a day to protect her kidneys. She also uses oxycodone  sparingly, taking less than a full tablet to extend its use. She has tried meloxicam  without success and is considering alternatives like Celebrex or Voltaren.  She has a history of migraines, which she feels have worsened, possibly due to pressure from fluid retention. She also reports shortness of breath, which has improved since starting iron  supplements after her ferritin levels dropped from 122 to 57. Her ferritin has since increased back to 129.  She has a history of allergic reactions to vaccines, including significant fluid overload  following a flu vaccine, and therefore avoids vaccinations. She manages sinus congestion with cetirizine  and Sudafed, noting significant relief from Sudafed.  Her social history includes being on permanent disability due to her lymphedema. She is actively involved in the care of her one-year-old great-niece, which she also functions as a form of exercise for her (taking walks in the neighborhood). She is motivated to maintain her health to continue these activities.       Medications: Outpatient Medications Prior to Visit  Medication Sig   butalbital -acetaminophen -caffeine  (FIORICET) 50-325-40 MG tablet TAKE 1 TO 2 TABLETS BY MOUTH 2 TIMES DAILY AS NEEDED FOR HEADACHE   calcium carbonate 100 mg/ml SUSP Take by mouth.   cetirizine  (ZYRTEC ) 10 MG tablet Take 1 tablet (10 mg total) by mouth daily.   Cholecalciferol (VITAMIN D3) 5000 units TABS Take by mouth daily at 6 (six) AM.   cyclobenzaprine  (FLEXERIL ) 5 MG tablet TAKE 1 TABLET BY MOUTH 3 TIMES A DAY AS NEEDED   ferrous sulfate 324 MG TBEC Take 324 mg by mouth.   fluocinonide  (LIDEX ) 0.05 % external solution Apply 1 application topically 2 (two) times daily.   fluticasone -salmeterol (WIXELA INHUB) 250-50 MCG/ACT AEPB Inhale 1 puff into the lungs in the morning and at bedtime.   Multiple Vitamin (MULTIVITAMIN) capsule Take 1 capsule by mouth daily.   nystatin  ointment (MYCOSTATIN ) Apply 1 application topically 2 (two) times daily. At mastectomy site as needed   omeprazole (PRILOSEC) 40 MG capsule Take 1 capsule by mouth daily.   phentermine  37.5 MG capsule Take 1  capsule (37.5 mg total) by mouth every morning.   potassium chloride  (KLOR-CON ) 10 MEQ tablet Take 1 tablet (10 mEq total) by mouth 2 (two) times daily. Take with each torsemide    promethazine  (PHENERGAN ) 12.5 MG tablet TAKE 1 TABLET BY MOUTH EVERY 8 HOURS AS NEEDED FOR NAUSEA OR VOMITING   rizatriptan  (MAXALT ) 10 MG tablet TAKE 1 TABLET BY MOUTH AT FIRST SIGN OF MIGRAINE SYMPTOMS.  IF NO RELIEF, A SECOND TABLET MAY BE TAKEN IN 2 HOURS. LIMIT: 2 TABLETS PER DAY.   torsemide  (DEMADEX ) 20 MG tablet Take 1 tablet (20 mg total) by mouth 2 (two) times daily.   valACYclovir  (VALTREX ) 500 MG tablet TAKE 1 TABLET BY MOUTH 2 TIMES DAILY AS NEEDED FOR FEVER BLISTERS   VENTOLIN  HFA 108 (90 Base) MCG/ACT inhaler INHALE 2 PUFFS BY MOUTH EVERY 4 TO 6 HOURS AS NEEDED   [DISCONTINUED] ibuprofen  (ADVIL ) 600 MG tablet Take 1 tablet (600 mg total) by mouth every 8 (eight) hours as needed. Do not take same day as meloxicam    [DISCONTINUED] LORazepam  (ATIVAN ) 1 MG tablet TAKE 1/2 TABLET BY MOUTH 2 TIMES DAILY AS NEEDED FOR ANXIETY   [DISCONTINUED] meloxicam  (MOBIC ) 15 MG tablet TAKE 1 TABLET BY MOUTH DAILY   [DISCONTINUED] oxyCODONE -acetaminophen  (PERCOCET/ROXICET) 5-325 MG tablet Take 1 tablet by mouth every 8 (eight) hours as needed for severe pain (pain score 7-10).   [DISCONTINUED] oxyCODONE -acetaminophen  (PERCOCET/ROXICET) 5-325 MG tablet Take 1 tablet by mouth every 8 (eight) hours as needed for severe pain (pain score 7-10).   No facility-administered medications prior to visit.    Review of Systems  Constitutional:  Negative for appetite change, chills, fatigue and fever.  Respiratory:  Positive for shortness of breath (at baseline, somewhat improved). Negative for chest tightness.   Cardiovascular:  Negative for chest pain and palpitations.  Gastrointestinal:  Negative for abdominal pain, nausea and vomiting.  Musculoskeletal:  Positive for arthralgias (generalized, r/t lymphedema).  Neurological:  Negative for dizziness and weakness.        Objective    BP 129/86 (BP Location: Left Arm, Patient Position: Sitting, Cuff Size: Normal)   Pulse (!) 107   Ht 5' 5 (1.651 m)   Wt 228 lb (103.4 kg)   SpO2 98%   BMI 37.94 kg/m     Physical Exam Constitutional:      Appearance: Normal appearance.  HENT:     Head: Normocephalic and atraumatic.  Eyes:     General: No scleral  icterus.    Extraocular Movements: Extraocular movements intact.     Conjunctiva/sclera: Conjunctivae normal.  Cardiovascular:     Rate and Rhythm: Normal rate and regular rhythm.     Pulses: Normal pulses.     Heart sounds: Normal heart sounds.  Pulmonary:     Effort: Pulmonary effort is normal. No respiratory distress.     Breath sounds: Normal breath sounds.  Abdominal:     Palpations: Abdomen is soft.  Musculoskeletal:     Right lower leg: No edema (no pitting edema).     Left lower leg: No edema (no pitting edema).     Comments: Generalized lymphedema noted.  Skin:    General: Skin is warm and dry.  Neurological:     Mental Status: She is alert and oriented to person, place, and time. Mental status is at baseline.  Psychiatric:        Mood and Affect: Mood normal.        Behavior: Behavior normal.  The 10-year ASCVD risk score (Arnett DK, et al., 2019) is: 3.3%   Values used to calculate the score:     Age: 36 years     Clincally relevant sex: Female     Is Non-Hispanic African American: No     Diabetic: No     Tobacco smoker: No     Systolic Blood Pressure: 129 mmHg     Is BP treated: No     HDL Cholesterol: 45 mg/dL     Total Cholesterol: 210 mg/dL   No results found for any visits on 12/15/23.  Assessment & Plan    Post-mastectomy lymphedema syndrome -     Ibuprofen ; Take 1 tablet (600 mg total) by mouth every 8 (eight) hours as needed. Do not take same day as meloxicam   Dispense: 90 tablet; Refill: 0 -     Celecoxib; Take 1 capsule (200 mg total) by mouth 2 (two) times daily.  Dispense: 180 capsule; Refill: 1 -     oxyCODONE -Acetaminophen ; Take 1 tablet by mouth every 8 (eight) hours as needed for severe pain (pain score 7-10).  Dispense: 20 tablet; Refill: 0  Other chronic pain -     Ibuprofen ; Take 1 tablet (600 mg total) by mouth every 8 (eight) hours as needed. Do not take same day as meloxicam   Dispense: 90 tablet; Refill: 0 -     Celecoxib; Take 1  capsule (200 mg total) by mouth 2 (two) times daily.  Dispense: 180 capsule; Refill: 1 -     oxyCODONE -Acetaminophen ; Take 1 tablet by mouth every 8 (eight) hours as needed for severe pain (pain score 7-10).  Dispense: 20 tablet; Refill: 0  Flushing -     Tryptase  Compression fracture of L5 vertebra, sequela  Other migraine without status migrainosus, not intractable -     Ibuprofen ; Take 1 tablet (600 mg total) by mouth every 8 (eight) hours as needed. Do not take same day as meloxicam   Dispense: 90 tablet; Refill: 0 -     Celecoxib; Take 1 capsule (200 mg total) by mouth 2 (two) times daily.  Dispense: 180 capsule; Refill: 1  Iron  deficiency  Restless leg syndrome  Seasonal allergic rhinitis due to pollen  Anxiety -     LORazepam ; TAKE 1/2 TABLET BY MOUTH 2 TIMES DAILY AS NEEDED FOR ANXIETY  Dispense: 30 tablet; Refill: 2       Post-mastectomy lymphedema syndrome; other chronic pain Lymphedema causing significant chronic pain and functional impairment. Current pump less effective, impacting fluid removal and quality of life. Torsemide  causing dryness, but labs stable. - Contact [employer] for a case Production designer, theatre/television/film to discuss pump options. - Continue torsemide  as current labs are stable. - Consider alternative pain management options such as Celebrex.  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 Baseline tryptase level established 8.6 (without antihistamines), will recheck patient's baseline while on antihistamines (cetirizine ). - Start cetirizine  daily for two weeks, then draw tryptase level. - Standing order for tryptase level in place, to be drawn (ideally) within four hours of symptom onset.  --- Increases greater than 1.2 x baseline value + 2 ng/mL are considered indicative of mast cell activation   Compression fracture of L5 vertebrae, sequela, chronic Chronic L5 compression fracture with ongoing pain. Considering chiropractic decompression  therapy. Kyphoplasty previously discussed with orthopedics as future option if pain becomes unmanageable. Patient prefers to avoid surgery. - Consider chiropractic decompression therapy for back pain relief, per patient preference. - Discuss  potential kyphoplasty if pain becomes unmanageable.  Other migraine, without status migrainosus, not intractable Increased frequency of migraines, possibly exacerbated by lymphedema-related pressure.  Prediabetes Prediabetes with rising A1c. Aware of need for increased activity and weight management. Limited due to pain related to lymphedema and chronic lumbar compression fracture.  Restless leg syndrome Restless legs syndrome improved with iron  supplementation. Ferritin levels increased. - Continue iron  supplementation every other day to maintain ferritin levels.  Iron  deficiency  Iron  deficiency improved with iron  supplementation. Ferritin levels increased from 57 to over 120. - Continue iron  supplementation (at reduced frequency of) every other day.  Seasonal allergic rhinitis due to pollen Allergic rhinitis managed with cetirizine  and Sudafed. Advised to take cetirizine  daily to prevent congestion. - Take cetirizine  daily to prevent congestion. - Use Sudafed only as needed for breakthrough congestion.  Vitamin B6 deficiency Vitamin B6 deficiency with levels at lower end of normal. - Start OTC B6 supplementation.     Return in about 6 months (around 06/13/2024) for CPE, Chronic f/u.      I discussed the assessment and treatment plan with the patient  The patient was provided an opportunity to ask questions and all were answered. The patient agreed with the plan and demonstrated an understanding of the instructions.   The patient was advised to call back or seek an in-person evaluation if the symptoms worsen or if the condition fails to improve as anticipated.  Total time was 50 minutes. That includes chart review before the visit, the  actual patient visit, and time spent on documentation after the visit.     LAURAINE LOISE BUOY, DO  [facility] [phone] (phone) [phone] (fax)  [facility]

## 2023-12-17 LAB — MAGNESIUM: Magnesium: 1.9 mg/dL (ref 1.6–2.3)

## 2023-12-17 LAB — CBC
Hematocrit: 46.3 % (ref 34.0–46.6)
Hemoglobin: 15.1 g/dL (ref 11.1–15.9)
MCH: 30.3 pg (ref 26.6–33.0)
MCHC: 32.6 g/dL (ref 31.5–35.7)
MCV: 93 fL (ref 79–97)
Platelets: 280 x10E3/uL (ref 150–450)
RBC: 4.98 x10E6/uL (ref 3.77–5.28)
RDW: 13.1 % (ref 11.7–15.4)
WBC: 7.5 x10E3/uL (ref 3.4–10.8)

## 2023-12-17 LAB — VITAMIN D 25 HYDROXY (VIT D DEFICIENCY, FRACTURES): Vit D, 25-Hydroxy: 41.2 ng/mL (ref 30.0–100.0)

## 2023-12-17 LAB — BASIC METABOLIC PANEL WITH GFR
BUN/Creatinine Ratio: 22 (ref 9–23)
BUN: 20 mg/dL (ref 6–24)
CO2: 22 mmol/L (ref 20–29)
Calcium: 9.3 mg/dL (ref 8.7–10.2)
Chloride: 99 mmol/L (ref 96–106)
Creatinine, Ser: 0.93 mg/dL (ref 0.57–1.00)
Glucose: 97 mg/dL (ref 70–99)
Potassium: 4.3 mmol/L (ref 3.5–5.2)
Sodium: 139 mmol/L (ref 134–144)
eGFR: 71 mL/min/1.73 (ref >59)

## 2023-12-17 LAB — IRON,TIBC AND FERRITIN PANEL
Ferritin: 129 ng/mL (ref 15–150)
Iron Saturation: 19 % (ref 15–55)
Iron: 74 ug/dL (ref 27–159)
Total Iron Binding Capacity: 386 ug/dL (ref 250–450)
UIBC: 312 ug/dL (ref 131–425)

## 2023-12-17 LAB — TSH: TSH: 3.18 u[IU]/mL (ref 0.450–4.500)

## 2023-12-17 LAB — VITAMIN B6: Vitamin B6: 8.7 ug/L (ref 3.4–65.2)

## 2023-12-17 LAB — VITAMIN E
Vitamin E (Alpha Tocopherol): 11.5 mg/L (ref 7.0–25.1)
Vitamin E(Gamma Tocopherol): 3.1 mg/L (ref 0.5–5.5)

## 2023-12-17 LAB — VITAMIN B12: Vitamin B-12: 540 pg/mL (ref 232–1245)

## 2023-12-17 LAB — VITAMIN K1, SERUM: VITAMIN K1: 1.26 ng/mL (ref 0.10–2.20)

## 2023-12-22 ENCOUNTER — Ambulatory Visit: Payer: Self-pay | Admitting: Family Medicine

## 2023-12-22 DIAGNOSIS — M9901 Segmental and somatic dysfunction of cervical region: Secondary | ICD-10-CM | POA: Diagnosis not present

## 2023-12-22 DIAGNOSIS — M5441 Lumbago with sciatica, right side: Secondary | ICD-10-CM | POA: Diagnosis not present

## 2023-12-22 DIAGNOSIS — M9904 Segmental and somatic dysfunction of sacral region: Secondary | ICD-10-CM | POA: Diagnosis not present

## 2023-12-22 DIAGNOSIS — M9903 Segmental and somatic dysfunction of lumbar region: Secondary | ICD-10-CM | POA: Diagnosis not present

## 2024-01-04 ENCOUNTER — Other Ambulatory Visit: Payer: Self-pay | Admitting: Family Medicine

## 2024-01-08 ENCOUNTER — Telehealth: Payer: Self-pay | Admitting: Family Medicine

## 2024-01-08 NOTE — Telephone Encounter (Signed)
 Copied from CRM [id number]. Topic: Clinical - Medication Refill >> Jan 08, 2024  3:03 PM Tiffini S wrote: Medication: cyclobenzaprine  (FLEXERIL ) 5 MG tablet  Has the patient contacted their pharmacy? Yes (Agent: If no, request that the patient contact the pharmacy for the refill. If patient does not wish to contact the pharmacy document the reason why and proceed with request.) (Agent: If yes, when and what did the pharmacy advise?)  This is the patient's preferred pharmacy:  [facility] - [place], [place] - [address] [place] [address] Phone: [phone] Fax: [phone]  Is this the correct pharmacy for this prescription? Yes If no, delete pharmacy and type the correct one.   Has the prescription been filled recently? Yes  Is the patient out of the medication? Yes  Has the patient been seen for an appointment in the last year OR does the patient have an upcoming appointment? Yes  Can we respond through MyChart? Yes  Agent: Please be advised that Rx refills may take up to 3 business days. We ask that you follow-up with your pharmacy.

## 2024-01-11 NOTE — Telephone Encounter (Signed)
 Duplicate request, refilled 01/08/24.  Requested Prescriptions  Pending Prescriptions Disp Refills   cyclobenzaprine  (FLEXERIL ) 5 MG tablet 90 tablet 1    Sig: Take 1 tablet (5 mg total) by mouth 3 (three) times daily as needed.     Not Delegated - Analgesics:  Muscle Relaxants Failed - 01/11/2024  1:35 PM      Failed - This refill cannot be delegated      Passed - Valid encounter within last 6 months    Recent Outpatient Visits           3 weeks ago Post-mastectomy lymphedema syndrome   [facility] [place], Lauraine SAILOR, DO   7 months ago Annual physical exam   [facility] [place], Lauraine SAILOR, DO   8 months ago Pruritus   [facility] [place], [place], PA-C       Future Appointments             In 5 months Pardue, Lauraine SAILOR, DO [place] [facility], [place]

## 2024-01-18 DIAGNOSIS — I890 Lymphedema, not elsewhere classified: Secondary | ICD-10-CM | POA: Diagnosis not present

## 2024-01-21 NOTE — Progress Notes (Deleted)
 Sabrina Lauraine SAILOR, DO   No chief complaint on file.   HPI:      [name] is a 58 y.o. G0P0000 whose LMP was No LMP recorded. Patient is postmenopausal., presents today for ***    Patient Active Problem List   Diagnosis Date Noted   Annual physical exam 06/15/2023   Chronic kidney disease, stage 2, mildly decreased GFR 06/15/2023   Elevated TSH 06/15/2023   Rash of foot 05/25/2023   Hx of athlete's foot 05/25/2023   Chronic right-sided low back pain with right-sided sciatica 02/19/2023   DDD (degenerative disc disease), cervical 10/07/2022   Osteopenia of lumbar spine 06/17/2022   Class 2 obesity without serious comorbidity with body mass index (BMI) of 36.0 to 36.9 in adult 06/11/2022   Insomnia due to medical condition 04/11/2021   Small intestinal bacterial overgrowth (SIBO) 04/11/2021   Vasomotor symptoms due to menopause 04/17/2020   Fat deposits 07/15/2019   Iron  deficiency anemia due to chronic blood loss 10/26/2018   Adrenal disorder 05/05/2017   Diuretic-induced hypokalemia 11/30/2014   Hypercholesteremia 10/03/2014   Prediabetes 10/03/2014   Abnormal weight gain 10/03/2014   Ventricular pre-excitation with arrhythmia 10/03/2014   Post-mastectomy lymphedema syndrome 10/03/2014   GERD (gastroesophageal reflux disease) 09/22/2014   Anxiety 09/11/2014   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 01/04/2014   History of asthma 10/29/2011   History of breast cancer 01/01/2011   Headache, migraine 06/18/2006    Past Surgical History:  Procedure Laterality Date   breast  Left 02/18/2017   Implant removed   BREAST ENHANCEMENT SURGERY Bilateral 2005   CARDIAC CATHETERIZATION  1994   CARDIAC CATHETERIZATION  1994   MASTECTOMY Right 02/2011   paraesophageal hernia  2021   PORT A CATH INJECTION ([facility] HX)  2013    Family History  Adopted: Yes  Problem Relation Age of Onset   Breast cancer Sister 32       1/2 sister; Genetic testing neg at [facility]   Thyroid  disease  Sister     Social History   Socioeconomic History   Marital status: Divorced    Spouse name: Not on file   Number of children: 0   Years of education: Not on file   Highest education level: Associate degree: academic program  Occupational History   Occupation: [employer]    Comment: part time   Occupation: disability  Tobacco Use   Smoking status: Never   Smokeless tobacco: Never  Vaping Use   Vaping status: Never Used  Substance and Sexual Activity   Alcohol use: No   Drug use: No   Sexual activity: Not Currently    Birth control/protection: Post-menopausal  Other Topics Concern   Not on file  Social History Narrative   Disability [sec to Lymphedema]; 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  No smoking or alcohol. Lives at home/with sister.    Social Drivers of Health   Financial Resource Strain: Medium Risk (12/11/2023)   Overall Financial Resource Strain (CARDIA)    Difficulty of Paying Living Expenses: Somewhat hard  Food Insecurity: Patient Declined (12/11/2023)   Hunger Vital Sign    Worried About Running Out of Food in the Last Year: Patient declined    Ran Out of Food in the Last Year: Patient declined  Transportation Needs: No Transportation Needs (12/11/2023)   PRAPARE - Administrator, Civil Service (Medical): No    Lack of Transportation (Non-Medical): No  Physical Activity: Insufficiently Active (12/11/2023)   Exercise Vital Sign    Days of Exercise per Week: 3 days    Minutes of Exercise per Session: 30 min  Stress: No Stress Concern Present (12/11/2023)   [employer] of Occupational Health - Occupational Stress Questionnaire    Feeling of Stress: Only a little  Social Connections: Unknown (12/11/2023)   Social Connection and Isolation Panel    Frequency of Communication with Friends and Family: Patient declined    Frequency of Social Gatherings with Friends and Family: Patient declined    Attends Religious Services: Patient  declined    Database Administrator or Organizations: No    Attends Engineer, Structural: Not on file    Marital Status: Divorced  Intimate Partner Violence: Not At Risk (06/18/2020)   Humiliation, Afraid, Rape, and Kick questionnaire    Fear of Current or Ex-Partner: No    Emotionally Abused: No    Physically Abused: No    Sexually Abused: No    Outpatient Medications Prior to Visit  Medication Sig Dispense Refill   butalbital -acetaminophen -caffeine  (FIORICET) 50-325-40 MG tablet TAKE 1 TO 2 TABLETS BY MOUTH 2 TIMES DAILY AS NEEDED FOR HEADACHE 60 tablet 1   calcium carbonate 100 mg/ml SUSP Take by mouth.     celecoxib (CELEBREX) 200 MG capsule Take 1 capsule (200 mg total) by mouth 2 (two) times daily. 180 capsule 1   cetirizine  (ZYRTEC ) 10 MG tablet Take 1 tablet (10 mg total) by mouth daily. 30 tablet 11   Cholecalciferol (VITAMIN D3) 5000 units TABS Take by mouth daily at 6 (six) AM.     cyclobenzaprine  (FLEXERIL ) 5 MG tablet TAKE 1 TABLET BY MOUTH 3 TIMES A DAY AS NEEDED 90 tablet 1   ferrous sulfate 324 MG TBEC Take 324 mg by mouth.     fluocinonide  (LIDEX ) 0.05 % external solution Apply 1 application topically 2 (two) times daily. 60 mL 3   fluticasone -salmeterol (WIXELA INHUB) 250-50 MCG/ACT AEPB Inhale 1 puff into the lungs in the morning and at bedtime. 60 each 2   ibuprofen  (ADVIL ) 600 MG tablet Take 1 tablet (600 mg total) by mouth every 8 (eight) hours as needed. Do not take same day as meloxicam  90 tablet 0   LORazepam  (ATIVAN ) 1 MG tablet TAKE 1/2 TABLET BY MOUTH 2 TIMES DAILY AS NEEDED FOR ANXIETY 30 tablet 2   Multiple Vitamin (MULTIVITAMIN) capsule Take 1 capsule by mouth daily.     nystatin  ointment (MYCOSTATIN ) Apply 1 application topically 2 (two) times daily. At mastectomy site as needed     omeprazole (PRILOSEC) 40 MG capsule Take 1 capsule by mouth daily.     oxyCODONE -acetaminophen  (PERCOCET/ROXICET) 5-325 MG tablet Take 1 tablet by mouth every 8 (eight)  hours as needed for severe pain (pain score 7-10). 20 tablet 0   phentermine  37.5 MG capsule Take 1 capsule (37.5 mg total) by mouth every morning. 90 capsule 0   potassium chloride  (KLOR-CON ) 10 MEQ tablet Take 1 tablet (10 mEq total) by mouth 2 (two) times daily. Take with each torsemide  180 tablet 3   promethazine  (PHENERGAN ) 12.5 MG tablet TAKE 1 TABLET BY MOUTH EVERY 8 HOURS AS NEEDED FOR NAUSEA OR VOMITING 30 tablet 0   rizatriptan  (MAXALT ) 10 MG tablet TAKE 1 TABLET BY MOUTH AT FIRST SIGN OF MIGRAINE SYMPTOMS. IF NO RELIEF, A SECOND TABLET MAY BE TAKEN IN 2 HOURS. LIMIT: 2 TABLETS PER DAY. 30 tablet 2   torsemide  (DEMADEX )  20 MG tablet Take 1 tablet (20 mg total) by mouth 2 (two) times daily. 60 tablet 6   valACYclovir  (VALTREX ) 500 MG tablet TAKE 1 TABLET BY MOUTH 2 TIMES DAILY AS NEEDED FOR FEVER BLISTERS 30 tablet 5   VENTOLIN  HFA 108 (90 Base) MCG/ACT inhaler INHALE 2 PUFFS BY MOUTH EVERY 4 TO 6 HOURS AS NEEDED 18 g 3   No facility-administered medications prior to visit.      ROS:  Review of Systems BREAST: No symptoms   OBJECTIVE:   Vitals:  There were no vitals taken for this visit.  Physical Exam  Results: No results found for this or any previous visit (from the past 24 hours).   Assessment/Plan: No diagnosis found.    No orders of the defined types were placed in this encounter.     No follow-ups on file.  Valerio Pinard B. Keilen Kahl, PA-C 01/21/2024 1:04 PM

## 2024-01-22 ENCOUNTER — Ambulatory Visit: Admitting: Obstetrics and Gynecology

## 2024-01-23 ENCOUNTER — Other Ambulatory Visit: Payer: Self-pay | Admitting: Family Medicine

## 2024-01-23 DIAGNOSIS — G43809 Other migraine, not intractable, without status migrainosus: Secondary | ICD-10-CM

## 2024-01-25 NOTE — Telephone Encounter (Signed)
 LOV- 12/15/2023 NOV- 06/15/2024 LRF- 09/01/2023 Outpatient Medication Detail   Disp Refills Start End   butalbital -acetaminophen -caffeine  (FIORICET) 50-325-40 MG tablet 60 tablet 1 09/01/2023 --   Sig: TAKE 1 TO 2 TABLETS BY MOUTH 2 TIMES DAILY AS NEEDED FOR HEADACHE   Sent to pharmacy as: butalbital -acetaminophen -caffeine  (FIORICET) 50-325-40 MG tablet   E-Prescribing Status: Receipt confirmed by pharmacy (09/01/2023 10:51 PM EDT)

## 2024-01-29 ENCOUNTER — Other Ambulatory Visit: Payer: Self-pay | Admitting: Family Medicine

## 2024-01-29 DIAGNOSIS — G43809 Other migraine, not intractable, without status migrainosus: Secondary | ICD-10-CM

## 2024-01-29 NOTE — Telephone Encounter (Signed)
 Copied from CRM #[id number]. Topic: Clinical - Medication Refill >> Jan 29, 2024 11:14 AM Amy B wrote: Medication: butalbital -acetaminophen -caffeine  (FIORICET) 50-325-40 MG tablet  Has the patient contacted their pharmacy? Yes (Agent: If no, request that the patient contact the pharmacy for the refill. If patient does not wish to contact the pharmacy document the reason why and proceed with request.) (Agent: If yes, when and what did the pharmacy advise?)  This is the patient's preferred pharmacy:  [facility] - [place], [place] - [address] [place] [address] Phone: [phone] Fax: [phone]  Is this the correct pharmacy for this prescription? Yes If no, delete pharmacy and type the correct one.   Has the prescription been filled recently? No  Is the patient out of the medication? No  Has the patient been seen for an appointment in the last year OR does the patient have an upcoming appointment? Yes  Can we respond through MyChart? Yes  Agent: Please be advised that Rx refills may take up to 3 business days. We ask that you follow-up with your pharmacy.

## 2024-01-31 NOTE — Telephone Encounter (Signed)
 Requested medications are due for refill today.  no  Requested medications are on the active medications list.  yes  Last refill. 01/30/2024  Future visit scheduled.     Notes to clinic.  Refusal not delegated.    Requested Prescriptions  Pending Prescriptions Disp Refills   butalbital -acetaminophen -caffeine  (FIORICET) 50-325-40 MG tablet 60 tablet 1     Not Delegated - Analgesics:  Non-Opioid Analgesic Combinations 2 Failed - 01/31/2024  3:05 PM      Failed - This refill cannot be delegated      Passed - Cr in normal range and within 360 days    Creatinine  Date Value Ref Range Status  08/17/2023 0.92 0.44 - 1.00 mg/dL Final  91/98/7982 0.9 0.6 - 1.1 mg/dL Final   Creatinine, Ser  Date Value Ref Range Status  12/09/2023 0.93 0.57 - 1.00 mg/dL Final         Passed - eGFR is 10 or above and within 360 days    EGFR (African American)  Date Value Ref Range Status  06/22/2013 >60  Final   GFR calc Af Amer  Date Value Ref Range Status  02/17/2020 93 >59 mL/min/1.73 Final    Comment:    **In accordance with recommendations from the NKF-ASN Task force,**   [facility] is in the process of updating its eGFR calculation to the   2021 CKD-EPI creatinine equation that estimates kidney function   without a race variable.    EGFR (Non-African Amer.)  Date Value Ref Range Status  06/22/2013 >60  Final    Comment:    eGFR values <20mL/min/1.73 m2 may be an indication of chronic kidney disease (CKD). Calculated eGFR is useful in patients with stable renal function. The eGFR calculation will not be reliable in acutely ill patients when serum creatinine is changing rapidly. It is not useful in  patients on dialysis. The eGFR calculation may not be applicable to patients at the low and high extremes of body sizes, pregnant women, and vegetarians. LABS - This specimen was collected through an   - indwelling catheter or arterial line.  - A minimum of of blood was wasted prior     - to collecting the sample.  Interpret  - results with caution.    GFR, Estimated  Date Value Ref Range Status  08/17/2023 >60 >60 mL/min Final    Comment:    (NOTE) Calculated using the CKD-EPI Creatinine Equation (2021)    eGFR  Date Value Ref Range Status  12/09/2023 71 >59 mL/min/1.73 Final         Passed - Patient is not pregnant      Passed - Valid encounter within last 12 months    Recent Outpatient Visits           1 month ago Post-mastectomy lymphedema syndrome   [facility] [place], Lauraine SAILOR, DO   7 months ago Annual physical exam   [facility] [place], Lauraine SAILOR, DO   8 months ago Pruritus   [facility] [place], [place], PA-C       Future Appointments             In 1 month Gollan, Timothy J, MD [facility] [place]   In 4 months [place], Lauraine SAILOR, DO [facility] [place]

## 2024-02-01 NOTE — Progress Notes (Unsigned)
 Donzella Lauraine SAILOR, DO   No chief complaint on file.   HPI:      [name] is a 58 y.o. G0P0000 whose LMP was No LMP recorded. Patient is postmenopausal., presents today for ***    Patient Active Problem List   Diagnosis Date Noted   Annual physical exam 06/15/2023   Chronic kidney disease, stage 2, mildly decreased GFR 06/15/2023   Elevated TSH 06/15/2023   Rash of foot 05/25/2023   Hx of athlete's foot 05/25/2023   Chronic right-sided low back pain with right-sided sciatica 02/19/2023   DDD (degenerative disc disease), cervical 10/07/2022   Osteopenia of lumbar spine 06/17/2022   Class 2 obesity without serious comorbidity with body mass index (BMI) of 36.0 to 36.9 in adult 06/11/2022   Insomnia due to medical condition 04/11/2021   Small intestinal bacterial overgrowth (SIBO) 04/11/2021   Vasomotor symptoms due to menopause 04/17/2020   Fat deposits 07/15/2019   Iron  deficiency anemia due to chronic blood loss 10/26/2018   Adrenal disorder 05/05/2017   Diuretic-induced hypokalemia 11/30/2014   Hypercholesteremia 10/03/2014   Prediabetes 10/03/2014   Abnormal weight gain 10/03/2014   Ventricular pre-excitation with arrhythmia 10/03/2014   Post-mastectomy lymphedema syndrome 10/03/2014   GERD (gastroesophageal reflux disease) 09/22/2014   Anxiety 09/11/2014   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 01/04/2014   History of asthma 10/29/2011   History of breast cancer 01/01/2011   Headache, migraine 06/18/2006    Past Surgical History:  Procedure Laterality Date   breast  Left 02/18/2017   Implant removed   BREAST ENHANCEMENT SURGERY Bilateral 2005   CARDIAC CATHETERIZATION  1994   CARDIAC CATHETERIZATION  1994   MASTECTOMY Right 02/2011   paraesophageal hernia  2021   PORT A CATH INJECTION ([facility] HX)  2013    Family History  Adopted: Yes  Problem Relation Age of Onset   Breast cancer Sister 51       1/2 sister; Genetic testing neg at [facility]   Thyroid  disease  Sister     Social History   Socioeconomic History   Marital status: Divorced    Spouse name: Not on file   Number of children: 0   Years of education: Not on file   Highest education level: Associate degree: academic program  Occupational History   Occupation: [employer]    Comment: part time   Occupation: disability  Tobacco Use   Smoking status: Never   Smokeless tobacco: Never  Vaping Use   Vaping status: Never Used  Substance and Sexual Activity   Alcohol use: No   Drug use: No   Sexual activity: Not Currently    Birth control/protection: Post-menopausal  Other Topics Concern   Not on file  Social History Narrative   Disability [sec to Lymphedema]; 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  No smoking or alcohol. Lives at home/with sister.    Social Drivers of Health   Financial Resource Strain: Medium Risk (12/11/2023)   Overall Financial Resource Strain (CARDIA)    Difficulty of Paying Living Expenses: Somewhat hard  Food Insecurity: Patient Declined (12/11/2023)   Hunger Vital Sign    Worried About Running Out of Food in the Last Year: Patient declined    Ran Out of Food in the Last Year: Patient declined  Transportation Needs: No Transportation Needs (12/11/2023)   PRAPARE - Administrator, Civil Service (Medical): No    Lack of Transportation (Non-Medical): No  Physical Activity: Insufficiently Active (12/11/2023)   Exercise Vital Sign    Days of Exercise per Week: 3 days    Minutes of Exercise per Session: 30 min  Stress: No Stress Concern Present (12/11/2023)   [employer] of Occupational Health - Occupational Stress Questionnaire    Feeling of Stress: Only a little  Social Connections: Unknown (12/11/2023)   Social Connection and Isolation Panel    Frequency of Communication with Friends and Family: Patient declined    Frequency of Social Gatherings with Friends and Family: Patient declined    Attends Religious Services: Patient  declined    Database Administrator or Organizations: No    Attends Engineer, Structural: Not on file    Marital Status: Divorced  Intimate Partner Violence: Not At Risk (06/18/2020)   Humiliation, Afraid, Rape, and Kick questionnaire    Fear of Current or Ex-Partner: No    Emotionally Abused: No    Physically Abused: No    Sexually Abused: No    Outpatient Medications Prior to Visit  Medication Sig Dispense Refill   butalbital -acetaminophen -caffeine  (FIORICET) 50-325-40 MG tablet TAKE 1 TO 2 TABLETS BY MOUTH 2 TIMES DAILY AS NEEDED FOR HEADACHE 60 tablet 1   calcium carbonate 100 mg/ml SUSP Take by mouth.     celecoxib (CELEBREX) 200 MG capsule Take 1 capsule (200 mg total) by mouth 2 (two) times daily. 180 capsule 1   cetirizine  (ZYRTEC ) 10 MG tablet Take 1 tablet (10 mg total) by mouth daily. 30 tablet 11   Cholecalciferol (VITAMIN D3) 5000 units TABS Take by mouth daily at 6 (six) AM.     cyclobenzaprine  (FLEXERIL ) 5 MG tablet TAKE 1 TABLET BY MOUTH 3 TIMES A DAY AS NEEDED 90 tablet 1   ferrous sulfate 324 MG TBEC Take 324 mg by mouth.     fluocinonide  (LIDEX ) 0.05 % external solution Apply 1 application topically 2 (two) times daily. 60 mL 3   fluticasone -salmeterol (WIXELA INHUB) 250-50 MCG/ACT AEPB Inhale 1 puff into the lungs in the morning and at bedtime. 60 each 2   ibuprofen  (ADVIL ) 600 MG tablet Take 1 tablet (600 mg total) by mouth every 8 (eight) hours as needed. Do not take same day as meloxicam  90 tablet 0   LORazepam  (ATIVAN ) 1 MG tablet TAKE 1/2 TABLET BY MOUTH 2 TIMES DAILY AS NEEDED FOR ANXIETY 30 tablet 2   Multiple Vitamin (MULTIVITAMIN) capsule Take 1 capsule by mouth daily.     nystatin  ointment (MYCOSTATIN ) Apply 1 application topically 2 (two) times daily. At mastectomy site as needed     omeprazole (PRILOSEC) 40 MG capsule Take 1 capsule by mouth daily.     oxyCODONE -acetaminophen  (PERCOCET/ROXICET) 5-325 MG tablet Take 1 tablet by mouth every 8 (eight)  hours as needed for severe pain (pain score 7-10). 20 tablet 0   phentermine  37.5 MG capsule Take 1 capsule (37.5 mg total) by mouth every morning. 90 capsule 0   potassium chloride  (KLOR-CON ) 10 MEQ tablet Take 1 tablet (10 mEq total) by mouth 2 (two) times daily. Take with each torsemide  180 tablet 3   promethazine  (PHENERGAN ) 12.5 MG tablet TAKE 1 TABLET BY MOUTH EVERY 8 HOURS AS NEEDED FOR NAUSEA OR VOMITING 30 tablet 0   rizatriptan  (MAXALT ) 10 MG tablet TAKE 1 TABLET BY MOUTH AT FIRST SIGN OF MIGRAINE SYMPTOMS. IF NO RELIEF, A SECOND TABLET MAY BE TAKEN IN 2 HOURS. LIMIT: 2 TABLETS PER DAY. 30 tablet 2   torsemide  (DEMADEX )  20 MG tablet Take 1 tablet (20 mg total) by mouth 2 (two) times daily. 60 tablet 6   valACYclovir  (VALTREX ) 500 MG tablet TAKE 1 TABLET BY MOUTH 2 TIMES DAILY AS NEEDED FOR FEVER BLISTERS 30 tablet 5   VENTOLIN  HFA 108 (90 Base) MCG/ACT inhaler INHALE 2 PUFFS BY MOUTH EVERY 4 TO 6 HOURS AS NEEDED 18 g 3   No facility-administered medications prior to visit.      ROS:  Review of Systems BREAST: No symptoms   OBJECTIVE:   Vitals:  There were no vitals taken for this visit.  Physical Exam  Results: No results found for this or any previous visit (from the past 24 hours).   Assessment/Plan: No diagnosis found.    No orders of the defined types were placed in this encounter.     No follow-ups on file.  Tawnya Pujol B. Kolbie Clarkston, PA-C 02/01/2024 5:09 PM

## 2024-02-02 ENCOUNTER — Ambulatory Visit: Admitting: Obstetrics and Gynecology

## 2024-02-02 ENCOUNTER — Encounter: Payer: Self-pay | Admitting: Obstetrics and Gynecology

## 2024-02-02 VITALS — BP 128/78 | HR 101 | Ht 65.0 in | Wt 228.0 lb

## 2024-02-02 DIAGNOSIS — N898 Other specified noninflammatory disorders of vagina: Secondary | ICD-10-CM

## 2024-02-02 NOTE — Patient Instructions (Signed)
 I value your feedback and you entrusting [place] with your care. If you get a [place] patient survey, I would appreciate you taking the time to let us know about your experience today. Thank you! ? ? ?

## 2024-02-10 ENCOUNTER — Ambulatory Visit

## 2024-02-15 ENCOUNTER — Other Ambulatory Visit: Payer: Self-pay | Admitting: Family Medicine

## 2024-02-15 DIAGNOSIS — Z6836 Body mass index (BMI) 36.0-36.9, adult: Secondary | ICD-10-CM

## 2024-02-16 ENCOUNTER — Encounter: Payer: Self-pay | Admitting: Family Medicine

## 2024-02-16 NOTE — Telephone Encounter (Signed)
 Please review in provider's absence.  Thank you.   LOV- 12/15/2023 NOV- 06/15/2024 LRF- 06/11/2022 Phentermine  90 x 0

## 2024-02-17 ENCOUNTER — Other Ambulatory Visit: Payer: Self-pay | Admitting: Family Medicine

## 2024-02-17 DIAGNOSIS — G43809 Other migraine, not intractable, without status migrainosus: Secondary | ICD-10-CM

## 2024-02-17 NOTE — Telephone Encounter (Signed)
 Please review in provider's absence.  Thank you.  LOV- 12/15/2023 NOV- 06/15/2024 LRF- 07/07/2023 Outpatient Medication Detail   Disp Refills Start End   rizatriptan  (MAXALT ) 10 MG tablet 30 tablet 2 07/07/2023 --   Sig: TAKE 1 TABLET BY MOUTH AT FIRST SIGN OF MIGRAINE SYMPTOMS. IF NO RELIEF, A SECOND TABLET MAY BE TAKEN IN 2 HOURS. LIMIT: 2 TABLETS PER DAY.   Sent to pharmacy as: rizatriptan  (MAXALT ) 10 MG tablet   E-Prescribing Status: Receipt confirmed by pharmacy (07/07/2023  3:27 PM EDT)

## 2024-02-22 ENCOUNTER — Other Ambulatory Visit: Payer: Self-pay | Admitting: Family Medicine

## 2024-02-22 DIAGNOSIS — I972 Postmastectomy lymphedema syndrome: Secondary | ICD-10-CM

## 2024-02-22 DIAGNOSIS — G8929 Other chronic pain: Secondary | ICD-10-CM

## 2024-02-23 DIAGNOSIS — R198 Other specified symptoms and signs involving the digestive system and abdomen: Secondary | ICD-10-CM | POA: Diagnosis not present

## 2024-02-23 DIAGNOSIS — R197 Diarrhea, unspecified: Secondary | ICD-10-CM | POA: Diagnosis not present

## 2024-02-23 DIAGNOSIS — K638219 Small intestinal bacterial overgrowth, unspecified: Secondary | ICD-10-CM | POA: Diagnosis not present

## 2024-02-23 DIAGNOSIS — K2270 Barrett's esophagus without dysplasia: Secondary | ICD-10-CM | POA: Diagnosis not present

## 2024-02-26 ENCOUNTER — Telehealth: Payer: Self-pay | Admitting: Internal Medicine

## 2024-02-26 NOTE — Telephone Encounter (Signed)
 Pt called and requested different day for lab - r/s w/pt - pt confirmed new date/time - LH

## 2024-02-29 ENCOUNTER — Ambulatory Visit

## 2024-02-29 VITALS — Ht 65.0 in | Wt 228.0 lb

## 2024-02-29 DIAGNOSIS — Z0000 Encounter for general adult medical examination without abnormal findings: Secondary | ICD-10-CM

## 2024-02-29 NOTE — Progress Notes (Signed)
 Chief Complaint  Patient presents with   Medicare Wellness     Subjective:   Sabrina Morris is a 58 y.o. female who presents for a Medicare Annual Wellness Visit.  Visit info / Clinical Intake: Medicare Wellness Visit Type:: Subsequent Annual Wellness Visit Persons participating in visit and providing information:: patient Medicare Wellness Visit Mode:: Telephone If telephone:: video declined Since this visit was completed virtually, some vitals may be partially provided or unavailable. Missing vitals are due to the limitations of the virtual format.: Unable to obtain vitals - no equipment If Telephone or Video please confirm:: I connected with patient using audio/video enable telemedicine. I verified patient identity with two identifiers, discussed telehealth limitations, and patient agreed to proceed. Patient Location:: home Provider Location:: home Interpreter Needed?: No Pre-visit prep was completed: yes AWV questionnaire completed by patient prior to visit?: yes Date:: 02/26/24 Living arrangements:: (!) (Patient-Rptd) lives alone Patient's Overall Health Status Rating: (Patient-Rptd) good Typical amount of pain: (!) (Patient-Rptd) a lot Does pain affect daily life?: (!) (Patient-Rptd) yes Are you currently prescribed opioids?: (!) yes  Dietary Habits and Nutritional Risks How many meals a day?: (Patient-Rptd) 2 Eats fruit and vegetables daily?: (!) (Patient-Rptd) no Most meals are obtained by: (Patient-Rptd) eating out In the last 2 weeks, have you had any of the following?: (!) nausea, vomiting, diarrhea (nausea) Diabetic:: no  Functional Status Activities of Daily Living (to include ambulation/medication): (Patient-Rptd) Independent Ambulation: Independent with device- listed below Home Assistive Devices/Equipment: CPAP Medication Administration: (Patient-Rptd) Independent Home Management (perform basic housework or laundry): (Patient-Rptd) Needs assistance  (comment) Manage your own finances?: (Patient-Rptd) yes Primary transportation is: (Patient-Rptd) driving Concerns about vision?: no *vision screening is required for WTM* Concerns about hearing?: no  Fall Screening Falls in the past year?: (Patient-Rptd) 0 Number of falls in past year: 0 Was there an injury with Fall?: 0 Fall Risk Category Calculator: 0 Patient Fall Risk Level: Low Fall Risk  Fall Risk Patient at Risk for Falls Due to: No Fall Risks Fall risk Follow up: Falls evaluation completed; Falls prevention discussed  Home and Transportation Safety: All rugs have non-skid backing?: (Patient-Rptd) yes All stairs or steps have railings?: (Patient-Rptd) yes Grab bars in the bathtub or shower?: (!) (Patient-Rptd) no Have non-skid surface in bathtub or shower?: (Patient-Rptd) yes Good home lighting?: (Patient-Rptd) yes Regular seat belt use?: (Patient-Rptd) yes Hospital stays in the last year:: (Patient-Rptd) no  Cognitive Assessment Difficulty concentrating, remembering, or making decisions? : (Patient-Rptd) yes Will 6CIT or Mini Cog be Completed: yes What year is it?: 0 points What month is it?: 0 points Give patient an address phrase to remember (5 components): [address] [place], [place] About what time is it?: 0 points Count backwards from 20 to 1: 0 points Say the months of the year in reverse: 0 points Repeat the address phrase from earlier: 0 points 6 CIT Score: 0 points  Advance Directives (For Healthcare) Does Patient Have a Medical Advance Directive?: No Would patient like information on creating a medical advance directive?: No - Patient declined  Reviewed/Updated  Reviewed/Updated: Reviewed All (Medical, Surgical, Family, Medications, Allergies, Care Teams, Patient Goals)    Allergies (verified) Sumatriptan, Feraheme  [ferumoxytol ], Haemophilus influenzae vaccines, Other, Sulfa antibiotics, Sulfamethoxazole, Tamoxifen, Zoladex  [goserelin], Codeine,  Hydrocodone-acetaminophen , and Topiramate    Current Medications (verified) Outpatient Encounter Medications as of 02/29/2024  Medication Sig   butalbital -acetaminophen -caffeine  (FIORICET) 50-325-40 MG tablet TAKE 1 TO 2 TABLETS BY MOUTH 2 TIMES DAILY AS NEEDED  FOR HEADACHE   calcium carbonate 100 mg/ml SUSP Take by mouth.   celecoxib  (CELEBREX ) 200 MG capsule Take 1 capsule (200 mg total) by mouth 2 (two) times daily.   cetirizine  (ZYRTEC ) 10 MG tablet Take 1 tablet (10 mg total) by mouth daily.   Cholecalciferol (VITAMIN D3) 5000 units TABS Take by mouth daily at 6 (six) AM.   cyclobenzaprine  (FLEXERIL ) 5 MG tablet TAKE 1 TABLET BY MOUTH 3 TIMES A DAY AS NEEDED   ferrous sulfate 324 MG TBEC Take 324 mg by mouth.   fluocinonide  (LIDEX ) 0.05 % external solution Apply 1 application topically 2 (two) times daily.   fluticasone -salmeterol (WIXELA INHUB) 250-50 MCG/ACT AEPB Inhale 1 puff into the lungs in the morning and at bedtime.   ibuprofen  (ADVIL ) 600 MG tablet Take 1 tablet (600 mg total) by mouth every 8 (eight) hours as needed. Do not take same day as meloxicam    LORazepam  (ATIVAN ) 1 MG tablet TAKE 1/2 TABLET BY MOUTH 2 TIMES DAILY AS NEEDED FOR ANXIETY   Multiple Vitamin (MULTIVITAMIN) capsule Take 1 capsule by mouth daily.   nystatin  ointment (MYCOSTATIN ) Apply 1 application topically 2 (two) times daily. At mastectomy site as needed   omeprazole (PRILOSEC) 40 MG capsule Take 1 capsule by mouth daily.   oxyCODONE -acetaminophen  (PERCOCET/ROXICET) 5-325 MG tablet Take 1 tablet by mouth every 8 (eight) hours as needed for severe pain (pain score 7-10).   phentermine  37.5 MG capsule Take 1 capsule (37.5 mg total) by mouth every morning.   potassium chloride  (KLOR-CON ) 10 MEQ tablet Take 1 tablet (10 mEq total) by mouth 2 (two) times daily. Take with each torsemide    promethazine  (PHENERGAN ) 12.5 MG tablet TAKE 1 TABLET BY MOUTH EVERY 8 HOURS AS NEEDED FOR NAUSEA OR VOMITING   rizatriptan   (MAXALT ) 10 MG tablet TAKE 1 TABLET BY MOUTH AT FIRST SIGN OF MIGRAINE SYMPTOMS. IF NO RELIEF, A SECOND TABLET MAY BE TAKEN IN 2 HOURS. LIMIT: 2 TABLETS PER DAY.   torsemide  (DEMADEX ) 20 MG tablet Take 1 tablet (20 mg total) by mouth 2 (two) times daily.   valACYclovir  (VALTREX ) 500 MG tablet TAKE 1 TABLET BY MOUTH 2 TIMES DAILY AS NEEDED FOR FEVER BLISTERS (Patient taking differently: as needed.)   VENTOLIN  HFA 108 (90 Base) MCG/ACT inhaler INHALE 2 PUFFS BY MOUTH EVERY 4 TO 6 HOURS AS NEEDED   No facility-administered encounter medications on file as of 02/29/2024.    History: Past Medical History:  Diagnosis Date   Adrenal disorder    Anemia 11/2018   Breast cancer (HCC)    H/O bone density study 2015   H/O colonoscopy    sch 06/12/15   H/O colonoscopy 11/2018   H/O endoscopy 11/2018   upper   Lymphedema    Lymphedema    Migraine    Pap smear for cervical cancer screening [id number]   Postmenopausal 07/02/2015   Past Surgical History:  Procedure Laterality Date   breast  Left 02/18/2017   Implant removed   BREAST ENHANCEMENT SURGERY Bilateral 2005   CARDIAC CATHETERIZATION  1994   CARDIAC CATHETERIZATION  1994   MASTECTOMY Right 02/2011   paraesophageal hernia  2021   PORT A CATH INJECTION ([facility] HX)  2013   Family History  Adopted: Yes  Problem Relation Age of Onset   Breast cancer Sister 55       1/2 sister; Genetic testing neg at [facility]   Thyroid  disease Sister    Social History   Occupational History  Occupation: [employer]    Comment: part time   Occupation: disability  Tobacco Use   Smoking status: Never   Smokeless tobacco: Never  Vaping Use   Vaping status: Never Used  Substance and Sexual Activity   Alcohol use: No   Drug use: No   Sexual activity: Not Currently    Birth control/protection: Post-menopausal   Tobacco Counseling Counseling given: Not Answered  SDOH Screenings   Food Insecurity: Food Insecurity Present (02/26/2024)  Housing: Unknown  (02/26/2024)  Transportation Needs: No Transportation Needs (02/26/2024)  Utilities: Not At Risk (02/29/2024)  Alcohol Screen: Low Risk (01/20/2022)  Depression (PHQ2-9): Low Risk (02/29/2024)  Financial Resource Strain: Medium Risk (02/26/2024)  Physical Activity: Insufficiently Active (02/26/2024)  Social Connections: Unknown (02/26/2024)  Stress: No Stress Concern Present (02/26/2024)  Tobacco Use: Low Risk (02/29/2024)  Health Literacy: Adequate Health Literacy (02/29/2024)   See flowsheets for full screening details  Depression Screen PHQ 2 & 9 Depression Scale- Over the past 2 weeks, how often have you been bothered by any of the following problems? Little interest or pleasure in doing things: 0 Feeling down, depressed, or hopeless (PHQ Adolescent also includes...irritable): 0 PHQ-2 Total Score: 0 Trouble falling or staying asleep, or sleeping too much: 0 Feeling tired or having little energy: 0 Poor appetite or overeating (PHQ Adolescent also includes...weight loss): 0 Feeling bad about yourself - or that you are a failure or have let yourself or your family down: 0 Trouble concentrating on things, such as reading the newspaper or watching television (PHQ Adolescent also includes...like school work): 0 Moving or speaking so slowly that other people could have noticed. Or the opposite - being so fidgety or restless that you have been moving around a lot more than usual: 0 Thoughts that you would be better off dead, or of hurting yourself in some way: 0 PHQ-9 Total Score: 0 If you checked off any problems, how difficult have these problems made it for you to do your work, take care of things at home, or get along with other people?: Not difficult at all  Depression Treatment Depression Interventions/Treatment : EYV7-0 Score <4 Follow-up Not Indicated     Goals Addressed               This Visit's Progress     Patient Stated (pt-stated)        Would like to breath  better/2025             Objective:    Today's Vitals   02/29/24 1313  Weight: 228 lb (103.4 kg)  Height: 5' 5 (1.651 m)   Body mass index is 37.94 kg/m.  Hearing/Vision screen Hearing Screening - Comments:: Denies hearing difficulties   Vision Screening - Comments:: Denies vision issues./[facility]/UTD  Immunizations and Health Maintenance Health Maintenance  Topic Date Due   COVID-19 Vaccine (1) Never done   DTaP/Tdap/Td (1 - Tdap) 06/14/2024 (Originally 12/21/1983)   Influenza Vaccine  06/14/2024 (Originally 10/16/2023)   Zoster Vaccines- Shingrix (1 of 2) 06/14/2024 (Originally 12/21/1983)   Hepatitis B Vaccines 19-59 Average Risk (1 of 3 - 19+ 3-dose series) 12/14/2024 (Originally 12/21/1983)   Mammogram  07/21/2024   Medicare Annual Wellness (AWV)  02/28/2025   Colonoscopy  07/31/2025   Cervical Cancer Screening (HPV/Pap Cotest)  06/17/2027   HIV Screening  Completed   HPV VACCINES  Aged Out   Meningococcal B Vaccine  Aged Out   Pneumococcal Vaccine: 50+ Years  Discontinued  Assessment/Plan:  This is a routine wellness examination for Kiffany.  Patient Care Team: Donzella Lauraine SAILOR, DO as PCP - General (Family Medicine) Perla Evalene PARAS, MD as PCP - Cardiology (Cardiology) Clydia Cristopher BIRCH., MD (Gastroenterology) Pa, [facility] [place] (Obstetrics and Gynecology) Rennie Cindy SAUNDERS, MD as Consulting Physician (Oncology)  I have personally reviewed and noted the following in the patients chart:   Medical and social history Use of alcohol, tobacco or illicit drugs  Current medications and supplements including opioid prescriptions. Functional ability and status Nutritional status Physical activity Advanced directives List of other physicians Hospitalizations, surgeries, and ER visits in previous 12 months Vitals Screenings to include cognitive, depression, and falls Referrals and appointments  No orders of  the defined types were placed in this encounter.  In addition, I have reviewed and discussed with patient certain preventive protocols, quality metrics, and best practice recommendations. A written personalized care plan for preventive services as well as general preventive health recommendations were provided to patient.   Athira Janowicz L Krissy Orebaugh, CMA   02/29/2024   Return in 1 year (on 02/28/2025).  After Visit Summary: (MyChart) Due to this being a telephonic visit, the after visit summary with patients personalized plan was offered to patient via MyChart   Nurse Notes: Patient is up to date on all health maintenance with no concerns to address today.

## 2024-02-29 NOTE — Patient Instructions (Addendum)
 Sabrina Morris,  Thank you for taking the time for your Medicare Wellness Visit. I appreciate your continued commitment to your health goals. Please review the care plan we discussed, and feel free to reach out if I can assist you further.  Please note that Annual Wellness Visits do not include a physical exam. Some assessments may be limited, especially if the visit was conducted virtually. If needed, we may recommend an in-person follow-up with your provider.  Ongoing Care Seeing your primary care provider every 3 to 6 months helps us  monitor your health and provide consistent, personalized care. Next office visit on 06/15/2024.  Keep up the good work.  Referrals If a referral was made during today's visit and you haven't received any updates within two weeks, please contact the referred provider directly to check on the status.  Recommended Screenings:  Health Maintenance  Topic Date Due   COVID-19 Vaccine (1) Never done   DTaP/Tdap/Td vaccine (1 - Tdap) 06/14/2024*   Flu Shot  06/14/2024*   Zoster (Shingles) Vaccine (1 of 2) 06/14/2024*   Hepatitis B Vaccine (1 of 3 - 19+ 3-dose series) 12/14/2024*   Breast Cancer Screening  07/21/2024   Medicare Annual Wellness Visit  02/28/2025   Colon Cancer Screening  07/31/2025   Pap with HPV screening  06/17/2027   HIV Screening  Completed   HPV Vaccine  Aged Out   Meningitis B Vaccine  Aged Out   Pneumococcal Vaccine for age over 96  Discontinued  *Topic was postponed. The date shown is not the original due date.       02/26/2024    9:43 AM  Advanced Directives  Does Patient Have a Medical Advance Directive? No    Vision: Annual vision screenings are recommended for early detection of glaucoma, cataracts, and diabetic retinopathy. These exams can also reveal signs of chronic conditions such as diabetes and high blood pressure.  Dental: Annual dental screenings help detect early signs of oral cancer, gum disease, and other conditions  linked to overall health, including heart disease and diabetes.  Please see the attached documents for additional preventive care recommendations.

## 2024-03-02 ENCOUNTER — Other Ambulatory Visit: Payer: Self-pay | Admitting: Family Medicine

## 2024-03-02 DIAGNOSIS — R110 Nausea: Secondary | ICD-10-CM

## 2024-03-02 NOTE — Telephone Encounter (Unsigned)
 Copied from CRM #[id number]. Topic: Clinical - Medication Refill >> Mar 02, 2024 10:49 AM Delon DASEN wrote: Medication: phentermine  37.5 MG capsule  Has the patient contacted their pharmacy? Yes (Agent: If no, request that the patient contact the pharmacy for the refill. If patient does not wish to contact the pharmacy document the reason why and proceed with request.) (Agent: If yes, when and what did the pharmacy advise?)  This is the patient's preferred pharmacy:  [facility] - [place], [place] - [address] [place] [address] Phone: [phone] Fax: [phone]  Is this the correct pharmacy for this prescription? Yes If no, delete pharmacy and type the correct one.   Has the prescription been filled recently? Yes  Is the patient out of the medication? Yes  Has the patient been seen for an appointment in the last year OR does the patient have an upcoming appointment? Yes  Can we respond through MyChart? Yes  Agent: Please be advised that Rx refills may take up to 3 business days. We ask that you follow-up with your pharmacy.

## 2024-03-03 ENCOUNTER — Ambulatory Visit: Admitting: Family Medicine

## 2024-03-03 ENCOUNTER — Other Ambulatory Visit: Payer: Self-pay

## 2024-03-03 VITALS — BP 135/84 | HR 82 | Temp 98.0°F | Ht 65.0 in | Wt 228.2 lb

## 2024-03-03 DIAGNOSIS — G8929 Other chronic pain: Secondary | ICD-10-CM | POA: Diagnosis not present

## 2024-03-03 DIAGNOSIS — I972 Postmastectomy lymphedema syndrome: Secondary | ICD-10-CM

## 2024-03-03 DIAGNOSIS — K638219 Small intestinal bacterial overgrowth, unspecified: Secondary | ICD-10-CM | POA: Diagnosis not present

## 2024-03-03 MED ORDER — OXYCODONE-ACETAMINOPHEN 5-325 MG PO TABS: 1.0000 | ORAL_TABLET | Freq: Three times a day (TID) | ORAL | 0 refills | Status: AC | PRN

## 2024-03-03 MED ORDER — PHENTERMINE HCL 37.5 MG PO CAPS: 37.5000 mg | ORAL_CAPSULE | ORAL | 0 refills | Status: AC

## 2024-03-03 NOTE — Assessment & Plan Note (Signed)
 Alternates between celebrex , ibuprofen  and percocet depending on severity of pain (only takes one option per day).

## 2024-03-03 NOTE — Progress Notes (Signed)
 "     Established patient visit   Patient: Sabrina Morris   DOB: March 16, 1966   58 y.o. Female  MRN: [id number] Visit Date: 03/03/2024  Today's healthcare provider: LAURAINE LOISE BUOY, DO   Chief Complaint  Patient presents with   Medication Refill    Patient is here for medication refills for Phentermine  and Oxycodone .   Subjective    Medication Refill   Sabrina Morris is a 58 year old female who presents for medication management and follow-up.  She experiences ongoing issues with migraine headaches, primarily occurring in the mornings. She uses oxycodone  sparingly, taking a quarter of a tablet to manage her pain, and has only one oxycodone  left.  She has small intestinal bacterial overgrowth (SIBO) and is awaiting a new round of Xifaxan due to high copay costs. She reports fluid retention and bloating, which she attributes to SIBO flares, and mentions that these symptoms cause her to appear pregnant. She has lost two pounds recently, attributing this to fluid fluctuations.  She experiences significant leg pain and has been fitted for lymphedema sleeves. However, the sleeves do not stay up due to their design, causing fluid accumulation below her knees. She has tried using a garter belt to secure the sleeves but finds it ineffective. She takes ferritin supplements, which have improved her shortness of breath, and she alternates between Celebrex  and ibuprofen  for pain management, taking one or the other daily to avoid kidney issues.  She uses Wixela and Ventolin  inhalers only when experiencing bronchitis-related shortness of breath. Her ferritin levels improved from 57 to 122, allowing her to reduce her intake to every other day, but she noticed increased shortness of breath when she did so.  She has not needed Valtrex  recently, as she has not experienced fever blisters in a while, possibly due to increased vitamin C supplementation. She continues to take potassium supplements, although she  forgot to take her recent refill home.      Medications: Show/hide medication list[1]       Objective    BP 135/84 (BP Location: Left Arm, Patient Position: Sitting, Cuff Size: Large)   Pulse 82   Temp 98 F (36.7 C) (Oral)   Ht 5' 5 (1.651 m)   Wt 228 lb 3.2 oz (103.5 kg)   SpO2 99%   BMI 37.97 kg/m     Physical Exam Vitals and nursing note reviewed.  Constitutional:      General: She is not in acute distress.    Appearance: Normal appearance.  HENT:     Head: Normocephalic and atraumatic.  Eyes:     General: No scleral icterus.    Conjunctiva/sclera: Conjunctivae normal.  Cardiovascular:     Rate and Rhythm: Normal rate.  Pulmonary:     Effort: Pulmonary effort is normal.  Neurological:     Mental Status: She is alert and oriented to person, place, and time. Mental status is at baseline.  Psychiatric:        Mood and Affect: Mood normal.        Behavior: Behavior normal.      No results found for any visits on 03/03/24.  Assessment & Plan    Post-mastectomy lymphedema syndrome -     oxyCODONE -Acetaminophen ; Take 1 tablet by mouth every 8 (eight) hours as needed for severe pain (pain score 7-10).  Dispense: 20 tablet; Refill: 0  Severe obesity (BMI 35.0-39.9) with comorbidity (HCC) -     Phentermine  HCl; Take 1 capsule (37.5 mg  total) by mouth every morning.  Dispense: 90 capsule; Refill: 0  Other chronic pain Assessment & Plan: Alternates between celebrex , ibuprofen  and percocet depending on severity of pain (only takes one option per day).  Orders: -     oxyCODONE -Acetaminophen ; Take 1 tablet by mouth every 8 (eight) hours as needed for severe pain (pain score 7-10).  Dispense: 20 tablet; Refill: 0  Small intestinal bacterial overgrowth (SIBO) Assessment & Plan: SIBO flare-ups cause fluid retention and bloating. Recent exacerbation.  Xifaxan effective in symptom control. - Continue Xifaxan as prescribed. - Follows with gastroenterology; defer to  specialist management.     Postmastectomy lymphedema syndrome Chronic lymphedema with fluid retention and discomfort. Current compression sleeves ineffective.  Uses full-body lymphedema pumps consistently at home. - Continue using compression sleeves as tolerated.  Severe obesity (BMI 35.0-39.9) with comorbidity Severe obesity with associated hypercholesterolemia and post-mastectomy lymphedema syndrome.  Weight management complicated by fluid retention. Fluid management prioritized over weight loss.    Return in about 3 months (around 06/15/2024) for CPE, as scheduled; and in 6 months for TOC w/Janna Ostwalt .      I discussed the assessment and treatment plan with the patient  The patient was provided an opportunity to ask questions and all were answered. The patient agreed with the plan and demonstrated an understanding of the instructions.   The patient was advised to call back or seek an in-person evaluation if the symptoms worsen or if the condition fails to improve as anticipated.    LAURAINE LOISE BUOY, DO  [facility] [phone] (phone) [phone] (fax)  [facility]     [1]  Outpatient Medications Prior to Visit  Medication Sig   butalbital -acetaminophen -caffeine  (FIORICET) 50-325-40 MG tablet TAKE 1 TO 2 TABLETS BY MOUTH 2 TIMES DAILY AS NEEDED FOR HEADACHE   calcium carbonate 100 mg/ml SUSP Take by mouth.   celecoxib  (CELEBREX ) 200 MG capsule Take 1 capsule (200 mg total) by mouth 2 (two) times daily.   cetirizine  (ZYRTEC ) 10 MG tablet Take 1 tablet (10 mg total) by mouth daily.   Cholecalciferol (VITAMIN D3) 5000 units TABS Take by mouth daily at 6 (six) AM.   cyclobenzaprine  (FLEXERIL ) 5 MG tablet TAKE 1 TABLET BY MOUTH 3 TIMES A DAY AS NEEDED   ferrous sulfate 324 MG TBEC Take 324 mg by mouth.   fluocinonide  (LIDEX ) 0.05 % external solution Apply 1 application topically 2 (two) times daily.   fluticasone -salmeterol (WIXELA  INHUB) 250-50 MCG/ACT AEPB Inhale 1 puff into the lungs in the morning and at bedtime.   ibuprofen  (ADVIL ) 600 MG tablet Take 1 tablet (600 mg total) by mouth every 8 (eight) hours as needed. Do not take same day as meloxicam    LORazepam  (ATIVAN ) 1 MG tablet TAKE 1/2 TABLET BY MOUTH 2 TIMES DAILY AS NEEDED FOR ANXIETY   Multiple Vitamin (MULTIVITAMIN) capsule Take 1 capsule by mouth daily.   nystatin  ointment (MYCOSTATIN ) Apply 1 application topically 2 (two) times daily. At mastectomy site as needed   omeprazole (PRILOSEC) 40 MG capsule Take 1 capsule by mouth daily.   potassium chloride  (KLOR-CON ) 10 MEQ tablet Take 1 tablet (10 mEq total) by mouth 2 (two) times daily. Take with each torsemide    rizatriptan  (MAXALT ) 10 MG tablet TAKE 1 TABLET BY MOUTH AT FIRST SIGN OF MIGRAINE SYMPTOMS. IF NO RELIEF, A SECOND TABLET MAY BE TAKEN IN 2 HOURS. LIMIT: 2 TABLETS PER DAY.   torsemide  (DEMADEX ) 20 MG tablet Take  1 tablet (20 mg total) by mouth 2 (two) times daily.   valACYclovir  (VALTREX ) 500 MG tablet TAKE 1 TABLET BY MOUTH 2 TIMES DAILY AS NEEDED FOR FEVER BLISTERS (Patient taking differently: as needed.)   [DISCONTINUED] oxyCODONE -acetaminophen  (PERCOCET/ROXICET) 5-325 MG tablet Take 1 tablet by mouth every 8 (eight) hours as needed for severe pain (pain score 7-10).   [DISCONTINUED] phentermine  37.5 MG capsule Take 1 capsule (37.5 mg total) by mouth every morning.   [DISCONTINUED] promethazine  (PHENERGAN ) 12.5 MG tablet TAKE 1 TABLET BY MOUTH EVERY 8 HOURS AS NEEDED FOR NAUSEA OR VOMITING   [DISCONTINUED] VENTOLIN  HFA 108 (90 Base) MCG/ACT inhaler INHALE 2 PUFFS BY MOUTH EVERY 4 TO 6 HOURS AS NEEDED   No facility-administered medications prior to visit.   "

## 2024-03-03 NOTE — Assessment & Plan Note (Addendum)
 SIBO flare-ups cause fluid retention and bloating. Recent exacerbation.  Xifaxan effective in symptom control. - Continue Xifaxan as prescribed. - Follows with gastroenterology; defer to specialist management.

## 2024-03-04 NOTE — Telephone Encounter (Signed)
 Requested medication (s) are due for refill today: yes  Requested medication (s) are on the active medication list: yes  Last refill:  02/17/23  Future visit scheduled: yes  Notes to clinic:  Unable to refill per protocol, cannot delegate.      Requested Prescriptions  Pending Prescriptions Disp Refills   promethazine  (PHENERGAN ) 12.5 MG tablet 30 tablet 0    Sig: Take 1 tablet (12.5 mg total) by mouth every 8 (eight) hours as needed.     Not Delegated - Gastroenterology: Antiemetics Failed - 03/04/2024  3:43 PM      Failed - This refill cannot be delegated      Passed - Valid encounter within last 6 months    Recent Outpatient Visits           Yesterday Small intestinal bacterial overgrowth (SIBO)   [facility] [place], Lauraine SAILOR, DO   2 months ago Post-mastectomy lymphedema syndrome   [facility] [place], Lauraine SAILOR, DO   8 months ago Annual physical exam   [facility] Donzella Lauraine SAILOR, DO   9 months ago Pruritus   [facility] [place], [place], PA-C       Future Appointments             In 2 weeks Gollan, Timothy J, MD [facility] [place]   In 3 months [place], Lauraine SAILOR, DO [facility], [place]

## 2024-03-07 MED ORDER — PROMETHAZINE HCL 12.5 MG PO TABS: 12.5000 mg | ORAL_TABLET | Freq: Three times a day (TID) | ORAL | 1 refills | Status: AC | PRN

## 2024-03-14 ENCOUNTER — Ambulatory Visit: Payer: Self-pay | Admitting: Family Medicine

## 2024-03-14 DIAGNOSIS — J40 Bronchitis, not specified as acute or chronic: Secondary | ICD-10-CM

## 2024-03-14 MED ORDER — HYDROCOD POLI-CHLORPHE POLI ER 10-8 MG/5ML PO SUER: 5.0000 mL | Freq: Two times a day (BID) | ORAL | 0 refills | Status: AC | PRN

## 2024-03-14 MED ORDER — DOXYCYCLINE HYCLATE 100 MG PO TABS: 100.0000 mg | ORAL_TABLET | Freq: Two times a day (BID) | ORAL | 0 refills | Status: DC

## 2024-03-14 MED ORDER — PREDNISONE 20 MG PO TABS: ORAL_TABLET | ORAL | 0 refills | Status: AC

## 2024-03-14 NOTE — Telephone Encounter (Signed)
 Called patient to let her know that the provider sent in 3 prescriptions for her which was Tussionex, Doxycycline , and Prednisone .  Patient verbalized understanding, confirmed the pharmacy that was on file was correct.

## 2024-03-14 NOTE — Telephone Encounter (Signed)
 FYI Only or Action Required?: Action required by provider: medication refill request.  Patient was last seen in primary care on 03/03/2024 by Donzella Lauraine SAILOR, DO.  Called Nurse Triage reporting Cough.  Symptoms began several days ago.  Interventions attempted: OTC medications: mucinex, ibuprofen , tylenol . , Prescription medications: advair, albuterol ,, and Rest, hydration, or home remedies.  Symptoms are: gradually worsening.  Triage Disposition: See HCP Within 4 Hours (Or PCP Triage)  Patient/caregiver understands and will follow disposition?: Yes  Copied from CRM #[id number]. Topic: Clinical - Red Word Triage >> Mar 14, 2024 10:52 AM Amy B wrote: Red Word that prompted transfer to Nurse Triage: chest tightness, wheezing, SOB, cough, low fever Saturday night. Started mucinex. Gets bronchitus 2X yearly for 25 years. Gets doxycycline , prednisone  taper, tussinex Reason for Disposition  [1] MILD difficulty breathing (e.g., minimal/no SOB at rest, SOB with walking, pulse < 100) AND [2] still present when not coughing  Answer Assessment - Initial Assessment Questions Using advair, albuterol , mucinex, ibuprofen , tylenol . Gets doxycycline , prednisone  taper, tussinex when she gets this bronchitis. Patient requesting meds, does not feel good enough to come in and can't wear a mask due to facial edema issues. 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 Offered to look for appointment, patient declined. 1. ONSET: When did the cough begin?      Saturday 3. SPUTUM: Describe the color of your sputum (e.g., none, dry cough; clear, white, yellow, green)     Unable to cough up mucus. 5. DIFFICULTY BREATHING: Are you having difficulty breathing? If Yes, ask: How bad is it? (e.g., mild, moderate, severe)      Mild, feels like she gets SOB just talking but is not out of the ordinary when she gets bronchitis. Happens 2X a year for 25 years. 6. FEVER: Do you have a fever? If Yes, ask: What is your temperature, how  was it measured, and when did it start?     99.9  7. CARDIAC HISTORY: Do you have any history of heart disease? (e.g., heart attack, congestive heart failure)      Denies 8. LUNG HISTORY: Do you have any history of lung disease?  (e.g., pulmonary embolus, asthma, emphysema)     Asthma 9. PE RISK FACTORS: Do you have a history of blood clots? (or: recent major surgery, recent prolonged travel, bedridden)     Denies 10. OTHER SYMPTOMS: Do you have any other symptoms? (e.g., runny nose, wheezing, chest pain)       Wheezing, chest tightness  Protocols used: Cough - Acute Non-Productive-A-AH

## 2024-03-15 ENCOUNTER — Other Ambulatory Visit: Payer: Self-pay

## 2024-03-15 ENCOUNTER — Encounter: Payer: Self-pay | Admitting: Family Medicine

## 2024-03-15 DIAGNOSIS — J45901 Unspecified asthma with (acute) exacerbation: Secondary | ICD-10-CM

## 2024-03-15 DIAGNOSIS — J209 Acute bronchitis, unspecified: Secondary | ICD-10-CM

## 2024-03-15 MED ORDER — VENTOLIN HFA 108 (90 BASE) MCG/ACT IN AERS: INHALATION_SPRAY | RESPIRATORY_TRACT | 3 refills | Status: AC

## 2024-03-18 ENCOUNTER — Ambulatory Visit

## 2024-03-18 ENCOUNTER — Ambulatory Visit: Admission: RE | Admit: 2024-03-18 | Discharge: 2024-03-18 | Disposition: A | Discharge status: Home / Self Care

## 2024-03-18 ENCOUNTER — Ambulatory Visit
Admission: RE | Admit: 2024-03-18 | Discharge: 2024-03-18 | Disposition: A | Discharge status: Home / Self Care | Source: Ambulatory Visit

## 2024-03-18 ENCOUNTER — Ambulatory Visit: Payer: Self-pay

## 2024-03-18 VITALS — BP 130/88 | HR 107 | Temp 98.1°F | Wt 223.6 lb

## 2024-03-18 DIAGNOSIS — R051 Acute cough: Secondary | ICD-10-CM | POA: Diagnosis present

## 2024-03-18 LAB — POC COVID19 BINAXNOW: SARS Coronavirus 2 Ag: NEGATIVE

## 2024-03-18 MED ORDER — AMOXICILLIN-POT CLAVULANATE 875-125 MG PO TABS: 1.0000 | ORAL_TABLET | Freq: Two times a day (BID) | ORAL | 0 refills | Status: AC

## 2024-03-18 NOTE — Telephone Encounter (Signed)
 " FYI Only or Action Required?: Action required by provider: request for appointment.  Patient was last seen in primary care on 03/03/2024 by Donzella Lauraine SAILOR, DO.  Called Nurse Triage reporting Cough.  Symptoms began a week ago.  Interventions attempted: Prescription medications:  SABRA  Symptoms are: unchanged. Prescribed Doxycycline , Prednisone , Tussionex this week, not helping.  Triage Disposition: See HCP Within 4 Hours (Or PCP Triage)  Patient/caregiver understands and will follow disposition?: Yes     Copied from CRM #[id number]. Topic: Clinical - Red Word Triage >> Mar 14, 2024 10:52 AM Amy B wrote: Red Word that prompted transfer to Nurse Triage: chest tightness, wheezing, SOB, cough, low fever >> Mar 18, 2024  7:46 AM Emylou G wrote: chest tightness, wheezing, SOB, cough - no more fever but still has all the things.SABRA all the meds called in last week - isn't working?   Answer Assessment - Initial Assessment Questions 1. ONSET: When did the cough begin?      Last weekend 2. SEVERITY: How bad is the cough today?      severe 3. SPUTUM: Describe the color of your sputum (e.g., none, dry cough; clear, white, yellow, green)     no 4. HEMOPTYSIS: Are you coughing up any blood? If Yes, ask: How much? (e.g., flecks, streaks, tablespoons, etc.)     no 5. DIFFICULTY BREATHING: Are you having difficulty breathing? If Yes, ask: How bad is it? (e.g., mild, moderate, severe)      yes 6. FEVER: Do you have a fever? If Yes, ask: What is your temperature, how was it measured, and when did it start?     no 7. CARDIAC HISTORY: Do you have any history of heart disease? (e.g., heart attack, congestive heart failure)      no 8. LUNG HISTORY: Do you have any history of lung disease?  (e.g., pulmonary embolus, asthma, emphysema)     asthma 9. PE RISK FACTORS: Do you have a history of blood clots? (or: recent major surgery, recent prolonged travel, bedridden)     no 10. OTHER  SYMPTOMS: Do you have any other symptoms? (e.g., runny nose, wheezing, chest pain)       Wheezing, chest tight, sore from cough 11. PREGNANCY: Is there any chance you are pregnant? When was your last menstrual period?       no 12. TRAVEL: Have you traveled out of the country in the last month? (e.g., travel history, exposures)       no  Protocols used: Cough - Acute Non-Productive-A-AH  Reason for Disposition  [1] MILD difficulty breathing (e.g., minimal/no SOB at rest, SOB with walking, pulse < 100) AND [2] still present when not coughing  Answer Assessment - Initial Assessment Questions 1. ONSET: When did the cough begin?      Last weekend 2. SEVERITY: How bad is the cough today?      severe 3. SPUTUM: Describe the color of your sputum (e.g., none, dry cough; clear, white, yellow, green)     no 4. HEMOPTYSIS: Are you coughing up any blood? If Yes, ask: How much? (e.g., flecks, streaks, tablespoons, etc.)     no 5. DIFFICULTY BREATHING: Are you having difficulty breathing? If Yes, ask: How bad is it? (e.g., mild, moderate, severe)      yes 6. FEVER: Do you have a fever? If Yes, ask: What is your temperature, how was it measured, and when did it start?     no 7. CARDIAC HISTORY: Do you have  any history of heart disease? (e.g., heart attack, congestive heart failure)      no 8. LUNG HISTORY: Do you have any history of lung disease?  (e.g., pulmonary embolus, asthma, emphysema)     asthma 9. PE RISK FACTORS: Do you have a history of blood clots? (or: recent major surgery, recent prolonged travel, bedridden)     no 10. OTHER SYMPTOMS: Do you have any other symptoms? (e.g., runny nose, wheezing, chest pain)       Wheezing, chest tight, sore from cough 11. PREGNANCY: Is there any chance you are pregnant? When was your last menstrual period?       no 12. TRAVEL: Have you traveled out of the country in the last month? (e.g., travel history,  exposures)       no  Protocols used: Cough - Acute Non-Productive-A-AH  "

## 2024-03-18 NOTE — Progress Notes (Addendum)
 "    Acute visit   Patient: Sabrina Morris   DOB: 08/11/1965   59 y.o. Female  MRN: [id number] PCP: Donzella Lauraine SAILOR, DO   Chief Complaint  Patient presents with   Cough    With some Wheezing Since Dec 27th has not gotten any better Has not ran a fever in the past two day. Has some body aches.    Migraine    Daily from the coughing   Subjective    Discussed the use of AI scribe software for clinical note transcription with the patient, who gave verbal consent to proceed.  History of Present Illness Sabrina Morris is a 59 year old female with recurrent bronchitis and asthma who presents with persistent respiratory symptoms despite taking doxycycline  and prednisone .  She experiences symptoms consistent with her recurrent bronchitis, which typically occurs twice a year. These symptoms began on Saturday night, March 12, 2024. Her usual treatment regimen includes doxycycline , prednisone , Advair, Ventolin , and Desenex, which typically results in significant improvement within 48 hours. However, this time, the symptoms have persisted despite a week of treatment.  She had a low-grade fever earlier in the week, which is unusual for her bronchitis but has occurred since undergoing chemotherapy. She has not had a fever for the past two days. She experiences shortness of breath and has been using Mucinex 1200 mg three times a day, Advair, and Ventolin  since the onset of symptoms. She has taken four to five doses of doxycycline  since Monday, March 14, 2024.  She experiences ear pain and has full-body lymphedema, a condition she developed following cancer treatment. She uses a FlexiTouch lymphedema pump for five to six hours daily but has been unable to use it consistently this week due to migraines triggered by coughing. She reports daily migraines and slight earaches.  She has been drinking fluids and tolerating PO.   She has a history of adverse reactions to flu vaccinations, resulting in  significant fluid retention, and therefore avoids vaccinations.     Review of systems as noted in HPI.   Objective    BP 130/88   Pulse (!) 107   Temp 98.1 F (36.7 C) (Oral)   Wt 223 lb 9.6 oz (101.4 kg)   SpO2 95%   BMI 37.21 kg/m  Physical Exam Constitutional:      Appearance: Normal appearance.  HENT:     Head: Normocephalic and atraumatic.     Right Ear: Tympanic membrane, ear canal and external ear normal.     Left Ear: Tympanic membrane, ear canal and external ear normal.     Mouth/Throat:     Mouth: Mucous membranes are moist.     Pharynx: Posterior oropharyngeal erythema present. No oropharyngeal exudate.  Eyes:     Pupils: Pupils are equal, round, and reactive to light.  Cardiovascular:     Rate and Rhythm: Regular rhythm. Tachycardia present.     Heart sounds: Normal heart sounds.  Pulmonary:     Effort: Pulmonary effort is normal. No respiratory distress.     Breath sounds: Wheezing present. No rhonchi or rales.  Skin:    General: Skin is warm.  Neurological:     General: No focal deficit present.     Mental Status: She is alert.      Results for orders placed or performed in visit on 03/18/24  POC COVID-19 BinaxNow  Result Value Ref Range   SARS Coronavirus 2 Ag Negative Negative    Assessment & Plan  Problem List Items Addressed This Visit   None Visit Diagnoses       Acute cough    -  Primary   Relevant Medications   amoxicillin -clavulanate (AUGMENTIN ) 875-125 MG tablet   Other Relevant Orders   POC COVID-19 BinaxNow (Completed)   DG Chest 2 View      Assessment & Plan Acute Cough Patient with ~1 week hx of cough, congestion, fever and dyspnea. Patient thought to have bronchitis and prescribed doxycycline  and prednisone  on 12/29. Has been taking for 5 days without improvement in symptoms. Afebrile today, she is mildly tachycardic today but recently took inhalers before visit. Differential includes pneumonia, asthma exacerbation,  bronchitis. Negative COVID test today.  - Ordered chest x-ray to evaluate for pneumonia. - Prescribed Augmentin  875 mg twice daily for 7 days. - Instructed to stop doxycycline . - Continue prednisone  course - Continue Advair and Ventolin  as needed. - Advised to go to the emergency room if symptoms worsen, such as increased shortness of breath, fever, or chest pain.  Meds ordered this encounter  Medications   amoxicillin -clavulanate (AUGMENTIN ) 875-125 MG tablet    Sig: Take 1 tablet by mouth 2 (two) times daily for 7 days.    Dispense:  14 tablet    Refill:  0     No follow-ups on file.      Isaiah DELENA Pepper, MD  [facility] [phone] (phone) [phone] (fax) "

## 2024-03-21 ENCOUNTER — Other Ambulatory Visit: Payer: Self-pay

## 2024-03-21 DIAGNOSIS — J40 Bronchitis, not specified as acute or chronic: Secondary | ICD-10-CM

## 2024-03-21 MED ORDER — PREDNISONE 20 MG PO TABS: ORAL_TABLET | ORAL | 0 refills | Status: AC

## 2024-03-22 ENCOUNTER — Ambulatory Visit: Admitting: Cardiovascular Disease

## 2024-03-28 DIAGNOSIS — B370 Candidal stomatitis: Secondary | ICD-10-CM

## 2024-03-28 MED ORDER — MAGIC MOUTHWASH: ORAL | 0 refills | Status: DC

## 2024-03-28 MED ORDER — NYSTATIN 100000 UNIT/ML MT SUSP: 5.0000 mL | Freq: Four times a day (QID) | OROMUCOSAL | 0 refills | Status: AC | PRN

## 2024-04-01 ENCOUNTER — Ambulatory Visit: Admitting: Family Medicine

## 2024-04-05 ENCOUNTER — Inpatient Hospital Stay

## 2024-04-06 ENCOUNTER — Other Ambulatory Visit

## 2024-04-25 ENCOUNTER — Ambulatory Visit: Admitting: Cardiovascular Disease

## 2024-06-15 ENCOUNTER — Encounter: Admitting: Family Medicine

## 2024-08-16 ENCOUNTER — Ambulatory Visit

## 2024-08-16 ENCOUNTER — Other Ambulatory Visit

## 2024-08-16 ENCOUNTER — Ambulatory Visit: Admitting: Internal Medicine

## 2024-09-01 ENCOUNTER — Encounter: Admitting: Physician Assistant
